# Patient Record
Sex: Female | Born: 1955
Health system: Southern US, Community
[De-identification: ages and names within clinical notes are randomized; demographics above are authoritative.]

## PROBLEM LIST (undated history)

## (undated) DIAGNOSIS — E538 Deficiency of other specified B group vitamins: Secondary | ICD-10-CM

## (undated) DIAGNOSIS — C50919 Malignant neoplasm of unspecified site of unspecified female breast: Secondary | ICD-10-CM

## (undated) DIAGNOSIS — K2270 Barrett's esophagus without dysplasia: Secondary | ICD-10-CM

## (undated) DIAGNOSIS — K219 Gastro-esophageal reflux disease without esophagitis: Secondary | ICD-10-CM

## (undated) DIAGNOSIS — M8580 Other specified disorders of bone density and structure, unspecified site: Secondary | ICD-10-CM

## (undated) DIAGNOSIS — I10 Essential (primary) hypertension: Secondary | ICD-10-CM

## (undated) DIAGNOSIS — J40 Bronchitis, not specified as acute or chronic: Secondary | ICD-10-CM

## (undated) DIAGNOSIS — C50912 Malignant neoplasm of unspecified site of left female breast: Secondary | ICD-10-CM

## (undated) DIAGNOSIS — F419 Anxiety disorder, unspecified: Secondary | ICD-10-CM

## (undated) HISTORY — DX: Essential (primary) hypertension: I10

## (undated) HISTORY — DX: Barrett's esophagus without dysplasia: K22.70

## (undated) HISTORY — DX: Deficiency of other specified B group vitamins: E53.8

## (undated) HISTORY — PX: MASTECTOMY: SHX3

## (undated) HISTORY — DX: Malignant neoplasm of unspecified site of unspecified female breast: C50.919

## (undated) HISTORY — DX: Gastro-esophageal reflux disease without esophagitis: K21.9

## (undated) HISTORY — PX: BREAST BIOPSY: SHX20

## (undated) HISTORY — DX: Other specified disorders of bone density and structure, unspecified site: M85.80

---

## 2001-06-01 ENCOUNTER — Encounter: Payer: Self-pay | Admitting: General Surgery

## 2001-06-01 ENCOUNTER — Ambulatory Visit (HOSPITAL_COMMUNITY): Admission: RE | Admit: 2001-06-01 | Discharge: 2001-06-01 | Payer: Self-pay | Admitting: General Surgery

## 2001-06-12 ENCOUNTER — Ambulatory Visit (HOSPITAL_COMMUNITY): Admission: RE | Admit: 2001-06-12 | Discharge: 2001-06-12 | Payer: Self-pay | Admitting: Internal Medicine

## 2001-11-30 ENCOUNTER — Ambulatory Visit (HOSPITAL_COMMUNITY): Admission: RE | Admit: 2001-11-30 | Discharge: 2001-11-30 | Payer: Self-pay | Admitting: General Surgery

## 2001-11-30 ENCOUNTER — Encounter: Payer: Self-pay | Admitting: General Surgery

## 2001-12-11 ENCOUNTER — Other Ambulatory Visit: Admission: RE | Admit: 2001-12-11 | Discharge: 2001-12-11 | Payer: Self-pay | Admitting: General Surgery

## 2002-06-17 ENCOUNTER — Encounter: Payer: Self-pay | Admitting: General Surgery

## 2002-06-17 ENCOUNTER — Ambulatory Visit (HOSPITAL_COMMUNITY): Admission: RE | Admit: 2002-06-17 | Discharge: 2002-06-17 | Payer: Self-pay | Admitting: General Surgery

## 2003-06-30 ENCOUNTER — Ambulatory Visit (HOSPITAL_COMMUNITY): Admission: RE | Admit: 2003-06-30 | Discharge: 2003-06-30 | Payer: Self-pay | Admitting: General Surgery

## 2005-02-15 ENCOUNTER — Ambulatory Visit (HOSPITAL_COMMUNITY): Admission: RE | Admit: 2005-02-15 | Discharge: 2005-02-15 | Payer: Self-pay | Admitting: General Surgery

## 2006-02-17 ENCOUNTER — Ambulatory Visit (HOSPITAL_COMMUNITY): Admission: RE | Admit: 2006-02-17 | Discharge: 2006-02-17 | Payer: Self-pay | Admitting: General Surgery

## 2006-03-08 ENCOUNTER — Ambulatory Visit (HOSPITAL_COMMUNITY): Admission: RE | Admit: 2006-03-08 | Discharge: 2006-03-08 | Payer: Self-pay | Admitting: General Surgery

## 2006-09-20 ENCOUNTER — Ambulatory Visit (HOSPITAL_COMMUNITY): Admission: RE | Admit: 2006-09-20 | Discharge: 2006-09-20 | Payer: Self-pay | Admitting: General Surgery

## 2007-03-21 ENCOUNTER — Ambulatory Visit (HOSPITAL_COMMUNITY): Admission: RE | Admit: 2007-03-21 | Discharge: 2007-03-21 | Payer: Self-pay | Admitting: General Surgery

## 2007-07-02 ENCOUNTER — Other Ambulatory Visit: Admission: RE | Admit: 2007-07-02 | Discharge: 2007-07-02 | Payer: Self-pay | Admitting: Obstetrics and Gynecology

## 2008-03-28 ENCOUNTER — Ambulatory Visit (HOSPITAL_COMMUNITY): Admission: RE | Admit: 2008-03-28 | Discharge: 2008-03-28 | Payer: Self-pay | Admitting: Pediatrics

## 2008-07-23 ENCOUNTER — Other Ambulatory Visit: Admission: RE | Admit: 2008-07-23 | Discharge: 2008-07-23 | Payer: Self-pay | Admitting: Obstetrics and Gynecology

## 2008-11-01 ENCOUNTER — Emergency Department (HOSPITAL_COMMUNITY): Admission: EM | Admit: 2008-11-01 | Discharge: 2008-11-01 | Payer: Self-pay | Admitting: Emergency Medicine

## 2008-11-27 ENCOUNTER — Ambulatory Visit (HOSPITAL_COMMUNITY): Admission: RE | Admit: 2008-11-27 | Discharge: 2008-11-27 | Payer: Self-pay | Admitting: Family Medicine

## 2009-04-01 ENCOUNTER — Ambulatory Visit (HOSPITAL_COMMUNITY): Admission: RE | Admit: 2009-04-01 | Discharge: 2009-04-01 | Payer: Self-pay | Admitting: General Surgery

## 2010-05-18 ENCOUNTER — Ambulatory Visit (HOSPITAL_COMMUNITY): Admission: RE | Admit: 2010-05-18 | Discharge: 2010-05-18 | Payer: Self-pay | Admitting: General Surgery

## 2010-05-26 ENCOUNTER — Ambulatory Visit (HOSPITAL_COMMUNITY): Admission: RE | Admit: 2010-05-26 | Discharge: 2010-05-26 | Payer: Self-pay | Admitting: General Surgery

## 2011-05-04 ENCOUNTER — Other Ambulatory Visit (HOSPITAL_COMMUNITY): Payer: Self-pay | Admitting: General Surgery

## 2011-05-04 DIAGNOSIS — Z139 Encounter for screening, unspecified: Secondary | ICD-10-CM

## 2011-05-20 ENCOUNTER — Ambulatory Visit (HOSPITAL_COMMUNITY)
Admission: RE | Admit: 2011-05-20 | Discharge: 2011-05-20 | Disposition: A | Discharge status: Home / Self Care | Payer: BC Managed Care – PPO | Source: Ambulatory Visit | Attending: General Surgery | Admitting: General Surgery

## 2011-05-20 DIAGNOSIS — Z139 Encounter for screening, unspecified: Secondary | ICD-10-CM

## 2011-05-20 DIAGNOSIS — Z1231 Encounter for screening mammogram for malignant neoplasm of breast: Secondary | ICD-10-CM | POA: Insufficient documentation

## 2011-06-10 ENCOUNTER — Encounter (INDEPENDENT_AMBULATORY_CARE_PROVIDER_SITE_OTHER): Payer: Self-pay | Admitting: *Deleted

## 2011-06-23 ENCOUNTER — Ambulatory Visit (INDEPENDENT_AMBULATORY_CARE_PROVIDER_SITE_OTHER): Payer: BC Managed Care – PPO | Admitting: Internal Medicine

## 2011-06-23 ENCOUNTER — Encounter (INDEPENDENT_AMBULATORY_CARE_PROVIDER_SITE_OTHER): Payer: Self-pay | Admitting: *Deleted

## 2011-06-23 ENCOUNTER — Other Ambulatory Visit (INDEPENDENT_AMBULATORY_CARE_PROVIDER_SITE_OTHER): Payer: Self-pay | Admitting: *Deleted

## 2011-06-23 ENCOUNTER — Encounter (INDEPENDENT_AMBULATORY_CARE_PROVIDER_SITE_OTHER): Payer: Self-pay | Admitting: Internal Medicine

## 2011-06-23 DIAGNOSIS — K219 Gastro-esophageal reflux disease without esophagitis: Secondary | ICD-10-CM

## 2011-06-23 DIAGNOSIS — I10 Essential (primary) hypertension: Secondary | ICD-10-CM | POA: Insufficient documentation

## 2011-06-23 NOTE — Progress Notes (Signed)
Subjective:     Patient ID: Heather Vaughan, female   DOB: Dec 27, 1955, 55 y.o.   MRN: [id number]  HPI She has had acid reflux for 2-3 yrs. She took Omeprazole for yrs and it worked. She says all of a sudden it stopped working. She was started on the Nexium about a month ago. With the Nexium she continues to have esophageal burning.  She is also taking and  acid reducer at night. Yesterday when she bent over she felt acid up in her esophagus. Her symptoms are  getting worse. At night it is worse. She is sleeping on 2 pillows. Brett Albino really gives her trouble.  Her appetite is good. No weight loss. She is on a bland diet.  She says she is constipated since starting the Nexium. She is having a BM about once a week.. No melena or bright red rectal bleeding.   Recent H. Pyloria was negative. No dysphagia. Review of Systems see hpi Current Outpatient Prescriptions  Medication Sig Dispense Refill  . DULoxetine (CYMBALTA) 30 MG capsule Take 30 mg by mouth daily.        Marland Kitchen esomeprazole (NEXIUM) 20 MG capsule Take 20 mg by mouth daily before breakfast.        . ranitidine (ZANTAC) 150 MG tablet Take 150 mg by mouth 2 (two) times daily.        Marland Kitchen losartan-hydrochlorothiazide (HYZAAR) 100-25 MG per tablet Take 1 tablet by mouth daily.        Marland Kitchen omeprazole (PRILOSEC) 20 MG capsule Take 20 mg by mouth daily.         Past Surgical History  Procedure Date  . Breast biopsy     all benign   Past Medical History  Diagnosis Date  . GERD (gastroesophageal reflux disease)   . Hypertension    History   Social History  . Marital Status: Married    Spouse Name: N/A    Number of Children: N/A  . Years of Education: N/A   Occupational History  . Not on file.   Social History Main Topics  . Smoking status: Never Smoker   . Smokeless tobacco: Not on file  . Alcohol Use: No  . Drug Use: No  . Sexually Active: Not on file   Other Topics Concern  . Not on file   Social History Narrative  . No narrative on  file   Family Status  Relation Status Death Age  . Mother Deceased     DM  . Father Deceased     HTN, Lung disease  . Brother Deceased     COPD       Objective:   Physical Exam  Filed Vitals:   06/23/11 1014  BP: 128/78  Pulse: 72  Temp: 98.7 F (37.1 C)  Height: 5\' 2"  (1.575 m)  Weight: 139 lb 8 oz (63.277 kg)    Alert and oriented. Skin warm and dry. Oral mucosa is moist. Natural teeth in good condition. Sclera anicteric, conjunctivae is pink. Thyroid not enlarged. No cervical lymphadenopathy. Lungs clear. Heart regular rate and rhythm.  Abdomen is soft. Bowel sounds are positive. No hepatomegaly. No abdominal masses felt. No tenderness.  No edema to lower extremities. Patient is alert and oriented.     Assessment:    Refractory GERD.  She has been on mutlple PPIs that have not completely controlled her symptoms.    Plan:    Will schedule and EGD with Dr. Karilyn Cota.    The risks  and benefits such as perforation, bleeding, and infection were reviewed with the patient and is agreeable. Nexium 20mg  BID

## 2011-06-23 NOTE — Patient Instructions (Signed)
Will schedule and EGD with Dr. Rehman 

## 2011-06-27 ENCOUNTER — Telehealth (INDEPENDENT_AMBULATORY_CARE_PROVIDER_SITE_OTHER): Payer: Self-pay | Admitting: *Deleted

## 2011-06-27 NOTE — Telephone Encounter (Signed)
Patient states he told her to take her nexium 20 mg bid instead of daily, by doing it this way she will not have enough pills and would like RX called to [facility] ([place])

## 2011-06-28 NOTE — Telephone Encounter (Signed)
Rx for BID Nexium called in to [facility]

## 2011-07-01 ENCOUNTER — Encounter (HOSPITAL_COMMUNITY): Payer: Self-pay | Admitting: Pharmacy Technician

## 2011-07-07 ENCOUNTER — Encounter (HOSPITAL_COMMUNITY): Payer: Self-pay

## 2011-07-07 ENCOUNTER — Other Ambulatory Visit: Payer: Self-pay

## 2011-07-07 ENCOUNTER — Encounter (HOSPITAL_COMMUNITY)
Admission: RE | Admit: 2011-07-07 | Discharge: 2011-07-07 | Disposition: A | Discharge status: Home / Self Care | Payer: BC Managed Care – PPO | Source: Ambulatory Visit | Attending: Podiatry | Admitting: Podiatry

## 2011-07-07 LAB — CBC
MCHC: 33.8 g/dL (ref 30.0–36.0)
RDW: 12.2 % (ref 11.5–15.5)

## 2011-07-07 LAB — BASIC METABOLIC PANEL
BUN: 12 mg/dL (ref 6–23)
GFR calc Af Amer: >90 mL/min (ref >90)
GFR calc non Af Amer: >90 mL/min (ref >90)
Potassium: 3.6 mEq/L (ref 3.5–5.1)
Sodium: 141 mEq/L (ref 135–145)

## 2011-07-07 LAB — SURGICAL PCR SCREEN: Staphylococcus aureus: NEGATIVE

## 2011-07-07 MED ORDER — CEFAZOLIN SODIUM 1-5 GM-% IV SOLN: 1.0000 g | Freq: Once | INTRAVENOUS | Status: DC

## 2011-07-07 NOTE — Patient Instructions (Addendum)
M20 Heather Vaughan  07/07/2011   Your procedure is scheduled on:   07/14/2011  Report to [facility] at  9:30  AM.  Call this number if you have problems the morning of surgery: [phone]   Remember:   Do not eat food:After Midnight.  Do not drink clear liquids: After Midnight.  Take these medicines the morning of surgery with A SIP OF WATER: cymbalta,nexium,hyzaar,prilosec  Do not wear jewelry, make-up or nail polish.  Do not wear lotions, powders, or perfumes. You may wear deodorant.  Do not shave 48 hours prior to surgery.  Do not bring valuables to the hospital.  Contacts, dentures or bridgework may not be worn into surgery.  Leave suitcase in the car. After surgery it may be brought to your room.  For patients admitted to the hospital, checkout time is 11:00 AM the day of discharge.   Patients discharged the day of surgery will not be allowed to drive home.  Name and phone number of your driver: family  Special Instructions: CHG Shower Use Special Wash: 1/2 bottle night before surgery and 1/2 bottle morning of surgery.   Please read over the following fact sheets that you were given: Pain Booklet, MRSA Information, Surgical Site Infection Prevention, Anesthesia Post-op Instructions and Care and Recovery After Surgery      Morton's Neuroma Neuralgia (nerve pain) or neuroma (benign [non-cancerous] nerve tumor) may develop on any interdigital nerve. The interdigital nerves (nerves between digits) of the foot travel beneath and between the metatarsals (long bones of the fore foot) and pass the nerve endings to the toes. The third interdigital is a common place for a small neuroma to form called Morton's neuroma. Another nerve to be affected commonly is the fourth interdigital nerve. This would be in approximately in the area of the base or ball under the bottom of your fourth toe. This condition occurs more commonly in women and is usually on one side. It is usually first noticed by  pain radiating (spreading) to the ball of the foot or to the toes. CAUSES The cause of interdigital neuralgia may be from low grade repetitive trauma (damage caused by an accident) as in activities causing a repeated pounding of the foot (running, jumping etc.). It is also caused by improper footwear or recent loss of the fatty padding on the bottom of the foot. TREATMENT  The condition often resolves (goes away) simply with decreasing activity if that is thought to be the cause. Proper shoes are beneficial. Orthotics (special foot support aids) such as a metatarsal bar are often beneficial. This condition usually responds to conservative therapy, however if surgery is necessary it usually brings complete relief. HOME CARE INSTRUCTIONS   Apply ice to the area of soreness for 15 to 20 minutes, 3 to 4 times per day, while awake for the first 2 days. Put ice in a plastic bag and place a towel between the bag of ice and your skin.   Only take over-the-counter or prescription medicines for pain, discomfort, or fever as directed by your caregiver.  MAKE SURE YOU:   Understand these instructions.   Will watch your condition.   Will get help right away if you are not doing well or get worse.  Document Released: 11/21/2000 Document Revised: 04/27/2011 Document Reviewed: 08/15/2005 ExitCare Patient Information 2012 ExitCare, [place]     PATIENT INSTRUCTIONS POST-ANESTHESIA  IMMEDIATELY FOLLOWING SURGERY:  Do not drive or operate machinery for the first twenty four hours after surgery.  Do not make any important decisions for twenty four hours after surgery or while taking narcotic pain medications or sedatives.  If you develop intractable nausea and vomiting or a severe headache please notify your doctor immediately.  FOLLOW-UP:  Please make an appointment with your surgeon as instructed. You do not need to follow up with anesthesia unless specifically instructed to do so.  WOUND CARE INSTRUCTIONS  (if applicable):  Keep a dry clean dressing on the anesthesia/puncture wound site if there is drainage.  Once the wound has quit draining you may leave it open to air.  Generally you should leave the bandage intact for twenty four hours unless there is drainage.  If the epidural site drains for more than 36-48 hours please call the anesthesia department.  QUESTIONS?:  Please feel free to call your physician or the hospital operator if you have any questions, and they will be happy to assist you.     [facility] Anesthesia Department [address] [place] [phone]

## 2011-07-08 ENCOUNTER — Other Ambulatory Visit (HOSPITAL_COMMUNITY): Payer: BC Managed Care – PPO

## 2011-07-13 NOTE — H&P (Signed)
Chief Complaint / History of Present Illness: This 55 year old female returns today for a consultation to discusses the proposed surgery.  She is scheduled for removal of neuroma of the third interspace of the right foot  on 07/14/2011 at [facility].  Past Medical History:  Psychiatric Hx: (+) anxiety disorder panic.   Cardiovascular Hx: (+) hypertension.   GI Hx: (+) acid reflux.   Medication History: Active: cymbalta (active), losartan (active), Nexium (active).   Allergies: No known medical allergies Past Surgical History: Patient/Guardian admits past surgical history of breast biopsy.   Social History: Patient/Guardian denies alcohol use, Patient/Guardian denies illegal drug use, Patient/Guardian denies tobacco use.   Family History: Patient/Guardian admits a family history of diabetes associated with mother, hypertension associated with father.   Review of Systems: No abnormal findings with the exception of the chief complaint.    Physical Exam: The patient is a pleasant, 55 year old female in no apparent distress.  She is oriented to person, place and time.  She is ambulatory.  Skin is warm, dry and supple.  All nails are well trimmed and free of pathology.  Pedal hair growth and distribution is normal.  No hyperkeratotic lesions are present.  No ulceration are present.  Dorsalis pedis and posterior tibial pulses measure 2/4.  Capillary refill time is immediate.  No pedal edema is present.  Muscle tone is normal.   Pain with palpation of the right 3rd intermetatarsal space is present.  Light touch sensation is grossly intact.    Test Results:  None to report at this time.    Impression:  Neuroma.    Plan:   The podiatric pathology and treatment options were again reviewed with the her.  I discussed with the her the surgical procedure itself, the indications, the risks, possible complications, post-operative course and alternative treatments. I gave no guarantees regarding the  outcome. She would like to proceed with the proposed surgery.  An informed consent was obtained.  She is to return for her post-operative appointment or sooner if problems arise.  Prescriptions: Rx: Phenergan- 12.5 mg tablet , Take 1 tablet by mouth every four to six hours as needed. Max daily dose: 6.  Dispense: 21 tablet. Refills: 1.  Allow Generic: Yes Rx: Percocet- 325 mg-10mg  tablet , Take 1 tablet by mouth every six hours as needed for pain. Max daily dose: 4.  Dispense: 56 tablet. Refills: 0.  Allow Generic: Yes

## 2011-07-14 ENCOUNTER — Encounter (HOSPITAL_COMMUNITY): Payer: Self-pay | Admitting: *Deleted

## 2011-07-14 ENCOUNTER — Encounter (HOSPITAL_COMMUNITY)
Admission: RE | Disposition: A | Discharge status: Home / Self Care | Payer: Self-pay | Source: Ambulatory Visit | Attending: Podiatry

## 2011-07-14 ENCOUNTER — Ambulatory Visit (HOSPITAL_COMMUNITY)
Admission: RE | Admit: 2011-07-14 | Discharge: 2011-07-14 | Disposition: A | Discharge status: Home / Self Care | Payer: BC Managed Care – PPO | Source: Ambulatory Visit | Attending: Podiatry | Admitting: Podiatry

## 2011-07-14 ENCOUNTER — Encounter (HOSPITAL_COMMUNITY): Payer: Self-pay | Admitting: Anesthesiology

## 2011-07-14 ENCOUNTER — Other Ambulatory Visit: Payer: Self-pay

## 2011-07-14 ENCOUNTER — Ambulatory Visit (HOSPITAL_COMMUNITY): Payer: BC Managed Care – PPO | Admitting: Anesthesiology

## 2011-07-14 DIAGNOSIS — I10 Essential (primary) hypertension: Secondary | ICD-10-CM | POA: Insufficient documentation

## 2011-07-14 DIAGNOSIS — D3613 Benign neoplasm of peripheral nerves and autonomic nervous system of lower limb, including hip: Secondary | ICD-10-CM

## 2011-07-14 DIAGNOSIS — D2120 Benign neoplasm of connective and other soft tissue of unspecified lower limb, including hip: Secondary | ICD-10-CM | POA: Insufficient documentation

## 2011-07-14 HISTORY — PX: EXCISION MORTON'S NEUROMA: SHX5013

## 2011-07-14 SURGERY — EXCISION, MORTON'S NEUROMA
Anesthesia: Monitor Anesthesia Care | Site: Foot | Laterality: Right | Wound class: Clean

## 2011-07-14 MED ORDER — CEFAZOLIN SODIUM 1 G IJ SOLR
INTRAMUSCULAR | Status: AC
  Filled 2011-07-14: qty 10

## 2011-07-14 MED ORDER — MIDAZOLAM HCL 2 MG/2ML IJ SOLN
INTRAMUSCULAR | Status: AC
  Filled 2011-07-14: qty 2

## 2011-07-14 MED ORDER — SODIUM CHLORIDE 0.9 % IR SOLN: 1.0000 "application " | Freq: Once | Status: DC

## 2011-07-14 MED ORDER — LACTATED RINGERS IV SOLN
INTRAVENOUS | Status: DC
  Administered 2011-07-14: 1000 mL via INTRAVENOUS

## 2011-07-14 MED ORDER — PROPOFOL 10 MG/ML IV EMUL
INTRAVENOUS | Status: DC | PRN
  Administered 2011-07-14: 75 ug/kg/min via INTRAVENOUS

## 2011-07-14 MED ORDER — CEFAZOLIN SODIUM 1-5 GM-% IV SOLN
INTRAVENOUS | Status: DC | PRN
  Administered 2011-07-14: 1 g via INTRAVENOUS

## 2011-07-14 MED ORDER — BUPIVACAINE HCL (PF) 0.5 % IJ SOLN
INTRAMUSCULAR | Status: AC
  Filled 2011-07-14: qty 30

## 2011-07-14 MED ORDER — FENTANYL CITRATE 0.05 MG/ML IJ SOLN
INTRAMUSCULAR | Status: AC
  Filled 2011-07-14: qty 2

## 2011-07-14 MED ORDER — BUPIVACAINE HCL (PF) 0.5 % IJ SOLN
INTRAMUSCULAR | Status: DC | PRN
  Administered 2011-07-14: 10 mL
  Administered 2011-07-14: 3 mL

## 2011-07-14 MED ORDER — SODIUM CHLORIDE 0.9 % IR SOLN
Status: DC | PRN
  Administered 2011-07-14: 1000 mL

## 2011-07-14 MED ORDER — MIDAZOLAM HCL 2 MG/2ML IJ SOLN
1.0000 mg | INTRAMUSCULAR | Status: DC | PRN
  Administered 2011-07-14: 2 mg via INTRAVENOUS

## 2011-07-14 MED ORDER — FENTANYL CITRATE 0.05 MG/ML IJ SOLN
INTRAMUSCULAR | Status: DC | PRN
  Administered 2011-07-14: 50 ug via INTRAVENOUS
  Administered 2011-07-14: 25 ug via INTRAVENOUS

## 2011-07-14 MED ORDER — PROPOFOL 10 MG/ML IV EMUL
INTRAVENOUS | Status: DC | PRN
  Administered 2011-07-14: 20 mg via INTRAVENOUS
  Administered 2011-07-14: 10 mg via INTRAVENOUS
  Administered 2011-07-14: 20 mg via INTRAVENOUS

## 2011-07-14 SURGICAL SUPPLY — 53 items
BAG HAMPER (MISCELLANEOUS) ×2 IMPLANT
BANDAGE ELASTIC 4 VELCRO NS (GAUZE/BANDAGES/DRESSINGS) IMPLANT
BANDAGE ESMARK 4X12 BL STRL LF (DISPOSABLE) ×1 IMPLANT
BANDAGE GAUZE ELAST BULKY 4 IN (GAUZE/BANDAGES/DRESSINGS) ×2 IMPLANT
BENZOIN TINCTURE PRP APPL 2/3 (GAUZE/BANDAGES/DRESSINGS) IMPLANT
BLADE AVERAGE 25X9 (BLADE) IMPLANT
BLADE OSC/SAGITTAL MD 9X18.5 (BLADE) IMPLANT
BLADE SURG 15 STRL LF DISP TIS (BLADE) ×1 IMPLANT
BLADE SURG 15 STRL SS (BLADE) ×1
BNDG ESMARK 4X12 BLUE STRL LF (DISPOSABLE) ×2
CHLORAPREP W/TINT 26ML (MISCELLANEOUS) ×2 IMPLANT
CLOTH BEACON ORANGE TIMEOUT ST (SAFETY) ×2 IMPLANT
COVER LIGHT HANDLE STERIS (MISCELLANEOUS) ×4 IMPLANT
CUFF TOURN SGL LL 12 (TOURNIQUET CUFF) ×2 IMPLANT
CUFF TOURNIQUET SINGLE 18IN (TOURNIQUET CUFF) IMPLANT
DECANTER SPIKE VIAL GLASS SM (MISCELLANEOUS) ×2 IMPLANT
DRAPE OEC MINIVIEW 54X84 (DRAPES) ×2 IMPLANT
DRSG ADAPTIC 3X8 NADH LF (GAUZE/BANDAGES/DRESSINGS) ×2 IMPLANT
DURA STEPPER LG (CAST SUPPLIES) IMPLANT
DURA STEPPER MED (CAST SUPPLIES) IMPLANT
DURA STEPPER SML (CAST SUPPLIES) IMPLANT
DURA STEPPER XL (SOFTGOODS) IMPLANT
ELECT REM PT RETURN 9FT ADLT (ELECTROSURGICAL) ×2
ELECTRODE REM PT RTRN 9FT ADLT (ELECTROSURGICAL) ×1 IMPLANT
GLOVE BIO SURGEON STRL SZ7.5 (GLOVE) ×2 IMPLANT
GLOVE BIOGEL PI IND STRL 7.0 (GLOVE) ×1 IMPLANT
GLOVE BIOGEL PI IND STRL 7.5 (GLOVE) ×1 IMPLANT
GLOVE BIOGEL PI INDICATOR 7.0 (GLOVE) ×1
GLOVE BIOGEL PI INDICATOR 7.5 (GLOVE) ×1
GLOVE ECLIPSE 6.5 STRL STRAW (GLOVE) ×2 IMPLANT
GLOVE ECLIPSE 7.0 STRL STRAW (GLOVE) ×2 IMPLANT
GLOVE EXAM NITRILE MD LF STRL (GLOVE) ×2 IMPLANT
GOWN STRL REIN XL XLG (GOWN DISPOSABLE) ×6 IMPLANT
K-WIRE 229MX1.6 (WIRE) IMPLANT
K-WIRE 6 (WIRE)
KIT ROOM TURNOVER APOR (KITS) ×2 IMPLANT
KWIRE 6 (WIRE) IMPLANT
MARKER SKIN DUAL TIP RULER LAB (MISCELLANEOUS) ×2 IMPLANT
NEEDLE HYPO 18GX1.5 BLUNT FILL (NEEDLE) IMPLANT
NEEDLE HYPO 27GX1-1/4 (NEEDLE) ×4 IMPLANT
NS IRRIG 1000ML POUR BTL (IV SOLUTION) ×2 IMPLANT
PACK BASIC LIMB (CUSTOM PROCEDURE TRAY) ×2 IMPLANT
PAD ARMBOARD 7.5X6 YLW CONV (MISCELLANEOUS) ×2 IMPLANT
RASP SM TEAR CROSS CUT (RASP) IMPLANT
SET BASIN LINEN APH (SET/KITS/TRAYS/PACK) ×2 IMPLANT
SPONGE GAUZE 4X4 12PLY (GAUZE/BANDAGES/DRESSINGS) ×2 IMPLANT
STRIP CLOSURE SKIN 1/2X4 (GAUZE/BANDAGES/DRESSINGS) ×2 IMPLANT
SUT BONE WAX W31G (SUTURE) IMPLANT
SUT MON AB 5-0 PS2 18 (SUTURE) ×2 IMPLANT
SUT PROLENE 4 0 PS 2 18 (SUTURE) ×2 IMPLANT
SUT VIC AB 4-0 PS2 27 (SUTURE) ×2 IMPLANT
SYR CONTROL 10ML LL (SYRINGE) ×4 IMPLANT
TOWEL OR 17X26 4PK STRL BLUE (TOWEL DISPOSABLE) IMPLANT

## 2011-07-14 NOTE — Anesthesia Postprocedure Evaluation (Signed)
  Anesthesia Post-op Note  Patient: Heather Vaughan  Procedure(s) Performed:  EXCISION MORTON'S NEUROMA - Removal Neuroma 3rd Interspace Right Foot  Patient Location: PACU and Short Stay  Anesthesia Type: MAC  Level of Consciousness: awake, alert  and oriented  Airway and Oxygen Therapy: Patient Spontanous Breathing  Post-op Pain: none  Post-op Assessment: Post-op Vital signs reviewed, Patient's Cardiovascular Status Stable, Respiratory Function Stable and Patent Airway  Post-op Vital Signs: Reviewed and stable  Complications: No apparent anesthesia complications

## 2011-07-14 NOTE — Interval H&P Note (Signed)
History and Physical Interval Note:   07/14/2011   11:03 AM   Heather Vaughan  has presented today for surgery, with the diagnosis of neuroma 3rd interspace right foot  The various methods of treatment have been discussed with the patient and family. After consideration of risks, benefits and other options for treatment, the patient has consented to  Procedure(s): EXCISION MORTON'S NEUROMA OF THE RIGHT FOOT as a surgical intervention .  The patients' history has been reviewed, patient examined, no change in status, stable for surgery.  I have reviewed the patients' chart and labs.  Questions were answered to the patient's satisfaction.     [name] IVAN  DPM

## 2011-07-14 NOTE — Transfer of Care (Signed)
Immediate Anesthesia Transfer of Care Note  Patient: Heather Vaughan  Procedure(s) Performed:  EXCISION MORTON'S NEUROMA - Removal Neuroma 3rd Interspace Right Foot  Patient Location: PACU and Short Stay  Anesthesia Type: MAC  Level of Consciousness: awake, alert  and oriented  Airway & Oxygen Therapy: Patient Spontanous Breathing  Post-op Assessment: Report given to PACU RN  Post vital signs: Reviewed and stable  Complications: No apparent anesthesia complications

## 2011-07-14 NOTE — Brief Op Note (Signed)
07/14/2011  12:29 PM  PATIENT:  Jaynie Bream  55 y.o. 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  PROCEDURE:  Excision of neuroma 3rd interspace right foot  SURGEON:  Dallas Schimke, DPM  ASSISTANTS:  Valetta Close, RN   ANESTHESIA:   MAC with local  EBL:  Total I/O In: 600 [I.V.:600] Out: 0   BLOOD ADMINISTERED:none  DRAINS: none   LOCAL MEDICATIONS USED:  MARCAINE 0.5 % PLAIN  SPECIMEN:  Excision  DISPOSITION OF SPECIMEN:  PATHOLOGY  COUNTS:  YES  TOURNIQUET:   Total Tourniquet Time Documented: Calf (Right) - 29 minutes  DICTATION: .Other Dictation: Dictation Number [id number]  PLAN OF CARE: Discharge to home after PACU  PATIENT DISPOSITION:  Short Stay   Delay start of Pharmacological VTE agent (>24hrs) due to surgical blood loss or risk of bleeding:  No

## 2011-07-14 NOTE — Op Note (Signed)
NAME:  Heather Vaughan, Heather Vaughan              ACCOUNT NO.:  [id number]  MEDICAL RECORD NO.:  [id number]  LOCATION:  [facility]                          FACILITY:  [facility]  PHYSICIAN:  B. Theola Sequin, MD   DATE OF BIRTH:  Jan 11, 1956  DATE OF PROCEDURE:  07/14/2011 DATE OF DISCHARGE:  07/14/2011                              OPERATIVE REPORT   SURGEON:  B. Theola Sequin, MD  ASSISTANT:  Valetta Close.  PREOPERATIVE DIAGNOSIS:  Neuroma, third interspace of the right foot.  POSTOPERATIVE DIAGNOSIS:  Neuroma, third interspace of the right foot.  PROCEDURE:  Excision of neuroma, third interspace, right foot.  ANESTHESIA:  MAC with local.  HEMOSTASIS:  Pneumatic ankle tourniquet at 250 mmHg.  ESTIMATED BLOOD LOSS:  Minimal (less than 5 mL injectable 0.5% Marcaine plain).  PATHOLOGY:  Neuroma, third interspace, right foot.  COMPLICATIONS:  None.  PROCEDURE IN DETAIL:  The patient was brought to the operating room, placed on the operative table in supine position.  Pneumatic ankle tourniquet was placed about the patient's right ankle.  The foot was anesthetized using 0.5% Marcaine plain.  The foot was scrubbed, prepped, and draped in the usual sterile manner.  The limb was then elevated, exsanguinated, and the pneumatic ankle tourniquet inflated to 250 mmHg.  Attention was directed to the dorsal aspect of the third interspace of the right foot where a linear longitudinal incision was made, extending from the webspace proximally.  Dissection was continued deep down to the level of the deep transverse metatarsal ligament which was transected. The neuroma was clearly visible through the incision site and it was traced distally into its bifurcation and transected at each proper digital nerve, extending into the third and fourth digits.  The neuroma was then traced proximally and transected proximal to the enlarged area of the nerve.  The remaining portion of the common digital nerve was transposed  into the adjacent interosseous musculature.  The wound was then irrigated with copious amounts of sterile irrigant.  Subcutaneous structure reapproximated using 4-0 Vicryl.  Skin was reapproximated using 5-0 Monocryl in a running subcuticular manner.  Incision was reinforced with Steri-Strips. Sterile compressive dressing was applied to the right foot.  The pneumatic ankle tourniquet was deflated and a prompt hyperemic response was noted to all digits of the operative foot.          ______________________________ B. Theola Sequin, MD     BIM/MEDQ  D:  07/14/2011  T:  07/14/2011  Job:  [id number]

## 2011-07-14 NOTE — Anesthesia Preprocedure Evaluation (Addendum)
Anesthesia Evaluation  Patient identified by MRN, date of birth, ID band Patient awake    Reviewed: Allergy & Precautions, H&P , NPO status , Patient's Chart, lab work & pertinent test results  History of Anesthesia Complications Negative for: history of anesthetic complications  Airway Mallampati: I      Dental  (+) Partial Upper and Teeth Intact   Pulmonary former smoker   Pulmonary exam normal       Cardiovascular hypertension, Pt. on medications Regular Normal    Neuro/Psych    GI/Hepatic GERD-  Medicated and Controlled,  Endo/Other    Renal/GU      Musculoskeletal   Abdominal   Peds  Hematology   Anesthesia Other Findings   Reproductive/Obstetrics                           Anesthesia Physical Anesthesia Plan  ASA: II  Anesthesia Plan: MAC   Post-op Pain Management:    Induction:   Airway Management Planned: Nasal Cannula  Additional Equipment:   Intra-op Plan:   Post-operative Plan:   Informed Consent: I have reviewed the patients History and Physical, chart, labs and discussed the procedure including the risks, benefits and alternatives for the proposed anesthesia with the patient or authorized representative who has indicated his/her understanding and acceptance.     Plan Discussed with:   Anesthesia Plan Comments:         Anesthesia Quick Evaluation

## 2011-07-19 ENCOUNTER — Encounter (HOSPITAL_COMMUNITY): Payer: Self-pay | Admitting: Podiatry

## 2011-07-25 MED ORDER — GLYCOPYRROLATE 0.2 MG/ML IJ SOLN
INTRAMUSCULAR | Status: AC
  Filled 2011-07-25: qty 2

## 2011-07-25 MED ORDER — NEOSTIGMINE METHYLSULFATE 1 MG/ML IJ SOLN
INTRAMUSCULAR | Status: AC
  Filled 2011-07-25: qty 10

## 2011-07-27 ENCOUNTER — Encounter (HOSPITAL_COMMUNITY): Payer: Self-pay | Admitting: Pharmacy Technician

## 2011-07-27 DIAGNOSIS — J40 Bronchitis, not specified as acute or chronic: Secondary | ICD-10-CM

## 2011-07-27 HISTORY — DX: Bronchitis, not specified as acute or chronic: J40

## 2011-08-02 MED ORDER — SODIUM CHLORIDE 0.45 % IV SOLN
Freq: Once | INTRAVENOUS | Status: AC
  Administered 2011-08-03: 12:00:00 via INTRAVENOUS

## 2011-08-03 ENCOUNTER — Other Ambulatory Visit (INDEPENDENT_AMBULATORY_CARE_PROVIDER_SITE_OTHER): Payer: Self-pay | Admitting: Internal Medicine

## 2011-08-03 ENCOUNTER — Encounter (HOSPITAL_COMMUNITY): Payer: Self-pay | Admitting: *Deleted

## 2011-08-03 ENCOUNTER — Encounter (HOSPITAL_COMMUNITY)
Admission: RE | Disposition: A | Discharge status: Home / Self Care | Payer: Self-pay | Source: Ambulatory Visit | Attending: Internal Medicine

## 2011-08-03 ENCOUNTER — Ambulatory Visit (HOSPITAL_COMMUNITY)
Admission: RE | Admit: 2011-08-03 | Discharge: 2011-08-03 | Disposition: A | Discharge status: Home / Self Care | Payer: BC Managed Care – PPO | Source: Ambulatory Visit | Attending: Internal Medicine | Admitting: Internal Medicine

## 2011-08-03 DIAGNOSIS — I10 Essential (primary) hypertension: Secondary | ICD-10-CM | POA: Insufficient documentation

## 2011-08-03 DIAGNOSIS — K219 Gastro-esophageal reflux disease without esophagitis: Secondary | ICD-10-CM | POA: Insufficient documentation

## 2011-08-03 DIAGNOSIS — K6389 Other specified diseases of intestine: Secondary | ICD-10-CM

## 2011-08-03 DIAGNOSIS — K449 Diaphragmatic hernia without obstruction or gangrene: Secondary | ICD-10-CM

## 2011-08-03 DIAGNOSIS — K2270 Barrett's esophagus without dysplasia: Secondary | ICD-10-CM | POA: Insufficient documentation

## 2011-08-03 DIAGNOSIS — K5520 Angiodysplasia of colon without hemorrhage: Secondary | ICD-10-CM

## 2011-08-03 DIAGNOSIS — Z79899 Other long term (current) drug therapy: Secondary | ICD-10-CM | POA: Insufficient documentation

## 2011-08-03 HISTORY — PX: ESOPHAGOGASTRODUODENOSCOPY: SHX5428

## 2011-08-03 HISTORY — DX: Bronchitis, not specified as acute or chronic: J40

## 2011-08-03 SURGERY — EGD (ESOPHAGOGASTRODUODENOSCOPY)
Anesthesia: Moderate Sedation

## 2011-08-03 MED ORDER — MEPERIDINE HCL 100 MG/ML IJ SOLN
INTRAMUSCULAR | Status: AC
  Filled 2011-08-03: qty 1

## 2011-08-03 MED ORDER — BUTAMBEN-TETRACAINE-BENZOCAINE 2-2-14 % EX AERO
INHALATION_SPRAY | CUTANEOUS | Status: DC | PRN
  Administered 2011-08-03: 2 via TOPICAL

## 2011-08-03 MED ORDER — MIDAZOLAM HCL 5 MG/5ML IJ SOLN
INTRAMUSCULAR | Status: AC
  Filled 2011-08-03: qty 10

## 2011-08-03 MED ORDER — MIDAZOLAM HCL 5 MG/5ML IJ SOLN
INTRAMUSCULAR | Status: DC | PRN
  Administered 2011-08-03: 1 mg via INTRAVENOUS
  Administered 2011-08-03 (×2): 2 mg via INTRAVENOUS

## 2011-08-03 MED ORDER — STERILE WATER FOR IRRIGATION IR SOLN
Status: DC | PRN
  Administered 2011-08-03: 13:00:00

## 2011-08-03 MED ORDER — MEPERIDINE HCL 25 MG/ML IJ SOLN
INTRAMUSCULAR | Status: DC | PRN
  Administered 2011-08-03 (×2): 25 mg via INTRAVENOUS

## 2011-08-03 NOTE — Op Note (Signed)
EGD PROCEDURE REPORT  PATIENT:  Heather Vaughan  MR#:  [id number] Birthdate:  07-30-1956, 55 y.o., female Endoscopist:  Dr. Malissa Hippo, MD Referred By:  Ms. Terie Purser, PA. Procedure Date: 08/03/2011  Procedure:   EGD  Indications:  Patient is 55 year old Caucasian female with symptoms of GERD for more than 10 years now with frequent breakthrough symptoms was requiring double dose PPI. Her UGI tract has never been surveyed. She is undergoing EGD primarily looking for Barrett's also make sure she does not have a large hiatal hernia.            Informed Consent:  The risks, benefits, alternatives & imponderables which include, but are not limited to, bleeding, infection, perforation, drug reaction and potential missed lesion have been reviewed.  The potential for biopsy, lesion removal, esophageal dilation, etc. have also been discussed.  Questions have been answered.  All parties agreeable.  Please see history & physical in medical record for more information.  Medications:  Demerol 50 mg IV Versed 5 mg IV Cetacaine spray topically for oropharyngeal anesthesia  Description of procedure:  The endoscope was introduced through the mouth and advanced to the second portion of the duodenum without difficulty or limitations. The mucosal surfaces were surveyed very carefully during advancement of the scope and upon withdrawal.  Findings:  Esophagus:  Mucosa of the esophagus was normal. Wavy GE junction with focal erythema. Biopsy taken to rule out short segment Barrett's. No erosions or ulcers identified. GEJ:  32 cm Hiatus:  34 cm Stomach:  Stomach was empty and distended very well with insufflation. Folds in the proximal stomach were normal. Few hyperplastic appearing polyps are noted at gastric body and fundus. Pictures were taken for the record. Duodenum:  Single AV malformation along the medial aspect of bulb along with few more fine punctate telangiectasia also at bulb. Post bulbar  mucosa and folds were normal.  Therapeutic/Diagnostic Maneuvers Performed:  Biopsy taken from GE junction to rule out short segment Barrett's esophagus.  Complications:  None  Impression: Wavy GE junction with focal erythema. Biopsy taken to rule out short segment Barrett's esophagus. No evidence of erosive esophagitis. Small sliding-type hernia. Single bulbar AV malformation long with few fine  punctate telangiectasia without stigmata of bleeding.  Recommendations:  Continue anti-reflux measures. Continue Nexium at 20 mg by mouth twice a day. I will contact patient with results of biopsy Office visit in 4 months. [name] U  08/03/2011  1:32 PM  CC: Dr. Terie Purser, PA, PA & Dr. No ref. provider found

## 2011-08-03 NOTE — H&P (Signed)
Heather Vaughan is an 55 y.o. female.   Chief Complaint: Patient is here for esophagogastroduodenoscopy. HPI: She is 55 year old Caucasian female who has had symptoms of GERD for over 10 years. He had done well with what sounds like with omeprazole. She was switched to Nexium continue to experience heartburn and regurgitation particularly at night with burning in her throat. She is doing better with double dose Nexium she is undergoing diagnostic EGD and make sure she doesn't have a large hernia or Barrett's esophagus. He has good appetite. She denies nausea, vomiting, abdominal pain melena or rectal bleeding.  Past Medical History  Diagnosis Date  . GERD (gastroesophageal reflux disease)   . Hypertension   . Bronchitis 07/27/2011    Past Surgical History  Procedure Date  . Excision morton's neuroma 07/14/2011    Procedure: EXCISION MORTON'S NEUROMA;  Surgeon: Dallas Schimke;  Location: [facility];  Service: Orthopedics;  Laterality: Right;  Removal Neuroma 3rd Interspace Right Foot  . Breast biopsy     X 4- benign    Family History  Problem Relation Age of Onset  . Anesthesia problems Neg Hx   . Hypotension Neg Hx   . Malignant hyperthermia Neg Hx   . Pseudochol deficiency Neg Hx    Social History:  reports that she quit smoking about 17 years ago. Her smoking use included Cigarettes. She has a 20 pack-year smoking history. She does not have any smokeless tobacco history on file. She reports that she does not drink alcohol or use illicit drugs.  Allergies: No Known Allergies  Medications Prior to Admission  Medication Dose Route Frequency Provider Last Rate Last Dose  . 0.45 % sodium chloride infusion   Intravenous Once Malissa Hippo, MD 20 mL/hr at 08/03/11 1212    . ceFAZolin (ANCEF) 1 G injection           . fentaNYL (SUBLIMAZE) 0.05 MG/ML injection           . glycopyrrolate (ROBINUL) 0.2 MG/ML injection           . meperidine (DEMEROL) 100 MG/ML injection           .  midazolam (VERSED) 5 MG/5ML injection           . neostigmine (PROSTIGMINE) 1 MG/ML injection           . DISCONTD: ANCEF 1 gram in 0.9% normal saline 500 mL irrigation  1 application Irrigation Once [employer]      . DISCONTD: bupivacaine (MARCAINE) 0.5 % injection    PRN Ephriam Jenkins McKinney   3 mL at 07/14/11 1215  . DISCONTD: lactated ringers infusion   Intravenous Continuous Laurene Footman, MD 75 mL/hr at 07/14/11 1041 1,000 mL at 07/14/11 1041  . DISCONTD: midazolam (VERSED) 2 MG/2ML injection           . DISCONTD: midazolam (VERSED) injection 1-2 mg  1-2 mg Intravenous Q5 Min x 3 PRN Laurene Footman, MD   2 mg at 07/14/11 1046  . DISCONTD: sodium chloride irrigation 0.9 %    PRN Ephriam Jenkins McKinney   1,000 mL at 07/14/11 1127   Medications Prior to Admission  Medication Sig Dispense Refill  . fish oil-omega-3 fatty acids 1000 MG capsule Take 2 g by mouth daily.        . traZODone (DESYREL) 50 MG tablet Take 1 tablet by mouth at bedtime as needed. For sleep      . acetaminophen-codeine (TYLENOL #2) 300-15 MG  per tablet Take 1 tablet by mouth every 6 (six) hours as needed. For pain         No results found for this or any previous visit (from the past 48 hour(s)). No results found.  Review of Systems  Constitutional: Negative for weight loss.  Gastrointestinal: Negative for abdominal pain, diarrhea, constipation, blood in stool and melena.    Blood pressure 132/82, pulse 85, temperature 99 F (37.2 C), temperature source Oral, resp. rate 18, height 5\' 2"  (1.575 m), weight 135 lb (61.236 kg), SpO2 95.00%. Physical Exam  Constitutional: She appears well-developed and well-nourished.  HENT:  Mouth/Throat: Oropharynx is clear and moist.  Eyes: Conjunctivae are normal. No scleral icterus.  Neck: No thyromegaly present.  Cardiovascular: Normal rate, regular rhythm and normal heart sounds.   No murmur heard. Respiratory: Effort normal and breath sounds normal.  GI: She  exhibits no distension and no mass. There is no tenderness.  Musculoskeletal: She exhibits no edema.  Lymphadenopathy:    She has no cervical adenopathy.  Neurological: She is alert.  Skin: Skin is warm and dry.     Assessment/Plan Chronic GERD poorly controlled with therapy Diagnostic EGD.  Tsuneo Faison U 08/03/2011, 1:09 PM

## 2011-08-09 ENCOUNTER — Encounter (INDEPENDENT_AMBULATORY_CARE_PROVIDER_SITE_OTHER): Payer: Self-pay | Admitting: *Deleted

## 2011-08-16 ENCOUNTER — Encounter (HOSPITAL_COMMUNITY): Payer: Self-pay | Admitting: Internal Medicine

## 2011-11-02 ENCOUNTER — Other Ambulatory Visit (INDEPENDENT_AMBULATORY_CARE_PROVIDER_SITE_OTHER): Payer: Self-pay | Admitting: Internal Medicine

## 2011-11-23 ENCOUNTER — Encounter (INDEPENDENT_AMBULATORY_CARE_PROVIDER_SITE_OTHER): Payer: Self-pay | Admitting: *Deleted

## 2011-12-01 ENCOUNTER — Encounter (INDEPENDENT_AMBULATORY_CARE_PROVIDER_SITE_OTHER): Payer: Self-pay | Admitting: *Deleted

## 2011-12-22 ENCOUNTER — Ambulatory Visit (INDEPENDENT_AMBULATORY_CARE_PROVIDER_SITE_OTHER): Payer: BC Managed Care – PPO | Admitting: Internal Medicine

## 2011-12-22 ENCOUNTER — Other Ambulatory Visit (INDEPENDENT_AMBULATORY_CARE_PROVIDER_SITE_OTHER): Payer: Self-pay | Admitting: *Deleted

## 2011-12-22 ENCOUNTER — Telehealth (INDEPENDENT_AMBULATORY_CARE_PROVIDER_SITE_OTHER): Payer: Self-pay | Admitting: *Deleted

## 2011-12-22 ENCOUNTER — Encounter (INDEPENDENT_AMBULATORY_CARE_PROVIDER_SITE_OTHER): Payer: Self-pay | Admitting: Internal Medicine

## 2011-12-22 VITALS — BP 128/60 | HR 72 | Temp 98.0°F | Ht 62.0 in | Wt 147.2 lb

## 2011-12-22 DIAGNOSIS — Z1211 Encounter for screening for malignant neoplasm of colon: Secondary | ICD-10-CM

## 2011-12-22 DIAGNOSIS — K2270 Barrett's esophagus without dysplasia: Secondary | ICD-10-CM

## 2011-12-22 NOTE — Telephone Encounter (Signed)
Patient needs trilyte 

## 2011-12-22 NOTE — Patient Instructions (Signed)
HOB elevated. Continue BID dosing of PPI

## 2011-12-22 NOTE — Progress Notes (Signed)
Subjective:     Patient ID: Heather Vaughan, female   DOB: [birth year], 56 y.o.   MRN: [id number]  HPIRebecca is a 56 yr old female here today for f/u after recently undergoing an EGD for GERD symptoms. EGD revealed short segment Barrett's esophagus. She tells me today that if she doesn't take the Nexium, she can tell it. Sometimes she has to take an extra acid reducer. She sleeps on 2 pillows.  Appetite is good. No weight loss. No abdominal pain. BMs are normal.   EGD: 08/03/2011: Impression:  Wavy GE junction with focal erythema. Biopsy taken to rule out short segment Barrett's esophagus.  No evidence of erosive esophagitis.  Small sliding-type hernia.  Single bulbar AV malformation long with few fine punctate telangiectasia without stigmata of bleeding. Biopsy:  She has short segment Barrett's esophagus but no displaced. Will consider repeating EGD in 3 years.   Review of Systems see hpi Current Outpatient Prescriptions  Medication Sig Dispense Refill  . acetaminophen-codeine (TYLENOL #2) 300-15 MG per tablet Take 1 tablet by mouth every 6 (six) hours as needed. For pain       . amoxicillin-clavulanate (AUGMENTIN XR) 1000-62.5 MG per tablet Take 2 tablets by mouth 2 (two) times daily.      . ciprofloxacin-dexamethasone (CIPRODEX) otic suspension 4 drops 2 (two) times daily.      . fish oil-omega-3 fatty acids 1000 MG capsule Take 2 g by mouth daily.        Marland Kitchen losartan-hydrochlorothiazide (HYZAAR) 100-25 MG per tablet Take 1 tablet by mouth daily.        Marland Kitchen NEXIUM 20 MG capsule take 1 capsule twice a day  60 capsule  3  . traZODone (DESYREL) 50 MG tablet Take 1 tablet by mouth at bedtime as needed. For sleep       Past Surgical History  Procedure Date  . Excision morton's neuroma 07/14/2011    Procedure: EXCISION MORTON'S NEUROMA;  Surgeon: Dallas Schimke;  Location: [facility];  Service: Orthopedics;  Laterality: Right;  Removal Neuroma 3rd Interspace Right Foot  . Breast biopsy     X 4- benign  . Esophagogastroduodenoscopy 08/03/2011    Procedure: ESOPHAGOGASTRODUODENOSCOPY (EGD);  Surgeon: Malissa Hippo, MD;  Location: [facility];  Service: Endoscopy;  Laterality: N/A;  12:45   Family Status  Relation Status Death Age  . Mother Deceased     DM  . Father Deceased     HTN, Lung disease  . Brother Deceased     COPD   History   Social History  . Marital Status: Married    Spouse Name: N/A    Number of Children: N/A  . Years of Education: N/A   Occupational History  . Not on file.   Social History Main Topics  . Smoking status: Former Smoker -- 1.0 packs/day for 20 years    Types: Cigarettes    Quit date: 09/05/1993  . Smokeless tobacco: Not on file  . Alcohol Use: No     occasionally  . Drug Use: No  . Sexually Active: Not on file   Other Topics Concern  . Not on file   Social History Narrative  . No narrative on file   No Known Allergies          Objective:   Physical Exam Filed Vitals:   12/22/11 1427  Height: 5\' 2"  (1.575 m)  Weight: 147 lb 3.2 oz (66.769 kg)   Alert and oriented. Skin warm and  dry. Oral mucosa is moist.   . Sclera anicteric, conjunctivae is pink. Thyroid not enlarged. No cervical lymphadenopathy. Lungs clear. Heart regular rate and rhythm.  Abdomen is soft. Bowel sounds are positive. No hepatomegaly. No abdominal masses felt. No tenderness.  No edema to lower extremities. Patient is alert and oriented.      Assessment:    Barrett's esophagus.  GERD. Continue BID Nexium. HOB elevated.    Plan:    Will check on last colonoscopy.  Continue Nexium BID. OTC acid reducer as needed.

## 2011-12-23 MED ORDER — PEG 3350-KCL-NA BICARB-NACL 420 G PO SOLR: 4000.0000 mL | Freq: Once | ORAL | Status: AC

## 2011-12-23 NOTE — Telephone Encounter (Signed)
Ann, would u put the medication back in.  It wouldn't take the first time.

## 2011-12-29 ENCOUNTER — Encounter (INDEPENDENT_AMBULATORY_CARE_PROVIDER_SITE_OTHER): Payer: Self-pay

## 2012-02-02 ENCOUNTER — Ambulatory Visit: Admit: 2012-02-02 | Payer: BC Managed Care – PPO | Admitting: Internal Medicine

## 2012-02-02 SURGERY — COLONOSCOPY
Anesthesia: Moderate Sedation

## 2012-02-27 ENCOUNTER — Other Ambulatory Visit (INDEPENDENT_AMBULATORY_CARE_PROVIDER_SITE_OTHER): Payer: Self-pay | Admitting: Internal Medicine

## 2012-02-28 NOTE — Telephone Encounter (Signed)
Patient will need ov prior to next refill. 

## 2012-04-25 ENCOUNTER — Other Ambulatory Visit (HOSPITAL_COMMUNITY): Payer: Self-pay | Admitting: General Surgery

## 2012-04-25 DIAGNOSIS — Z139 Encounter for screening, unspecified: Secondary | ICD-10-CM

## 2012-05-24 ENCOUNTER — Ambulatory Visit (HOSPITAL_COMMUNITY)
Admission: RE | Admit: 2012-05-24 | Discharge: 2012-05-24 | Disposition: A | Discharge status: Home / Self Care | Payer: BC Managed Care – PPO | Source: Ambulatory Visit | Attending: General Surgery | Admitting: General Surgery

## 2012-05-24 DIAGNOSIS — Z139 Encounter for screening, unspecified: Secondary | ICD-10-CM

## 2012-05-24 DIAGNOSIS — Z1231 Encounter for screening mammogram for malignant neoplasm of breast: Secondary | ICD-10-CM | POA: Insufficient documentation

## 2013-02-19 ENCOUNTER — Other Ambulatory Visit (HOSPITAL_COMMUNITY): Payer: Self-pay | Admitting: Podiatry

## 2013-02-19 DIAGNOSIS — M79609 Pain in unspecified limb: Secondary | ICD-10-CM

## 2013-02-19 DIAGNOSIS — R609 Edema, unspecified: Secondary | ICD-10-CM

## 2013-02-20 ENCOUNTER — Ambulatory Visit (HOSPITAL_COMMUNITY)
Admission: RE | Admit: 2013-02-20 | Discharge: 2013-02-20 | Disposition: A | Discharge status: Home / Self Care | Payer: BC Managed Care – PPO | Source: Ambulatory Visit | Attending: Podiatry | Admitting: Podiatry

## 2013-02-20 DIAGNOSIS — R609 Edema, unspecified: Secondary | ICD-10-CM | POA: Insufficient documentation

## 2013-02-20 DIAGNOSIS — M7989 Other specified soft tissue disorders: Secondary | ICD-10-CM | POA: Insufficient documentation

## 2013-02-20 DIAGNOSIS — M79609 Pain in unspecified limb: Secondary | ICD-10-CM | POA: Insufficient documentation

## 2013-03-11 ENCOUNTER — Other Ambulatory Visit (HOSPITAL_COMMUNITY): Payer: Self-pay | Admitting: Podiatry

## 2013-03-11 DIAGNOSIS — S96912S Strain of unspecified muscle and tendon at ankle and foot level, left foot, sequela: Secondary | ICD-10-CM

## 2013-03-13 ENCOUNTER — Ambulatory Visit (HOSPITAL_COMMUNITY)
Admission: RE | Admit: 2013-03-13 | Discharge: 2013-03-13 | Disposition: A | Discharge status: Home / Self Care | Payer: BC Managed Care – PPO | Source: Ambulatory Visit | Attending: Podiatry | Admitting: Podiatry

## 2013-03-13 ENCOUNTER — Encounter (HOSPITAL_COMMUNITY): Payer: Self-pay

## 2013-03-13 DIAGNOSIS — S96912S Strain of unspecified muscle and tendon at ankle and foot level, left foot, sequela: Secondary | ICD-10-CM

## 2013-03-13 DIAGNOSIS — X58XXXA Exposure to other specified factors, initial encounter: Secondary | ICD-10-CM | POA: Insufficient documentation

## 2013-03-13 DIAGNOSIS — M25473 Effusion, unspecified ankle: Secondary | ICD-10-CM | POA: Insufficient documentation

## 2013-03-13 DIAGNOSIS — S93409A Sprain of unspecified ligament of unspecified ankle, initial encounter: Secondary | ICD-10-CM | POA: Insufficient documentation

## 2013-03-13 DIAGNOSIS — M8430XA Stress fracture, unspecified site, initial encounter for fracture: Secondary | ICD-10-CM | POA: Insufficient documentation

## 2013-03-13 DIAGNOSIS — M25476 Effusion, unspecified foot: Secondary | ICD-10-CM | POA: Insufficient documentation

## 2013-05-14 ENCOUNTER — Other Ambulatory Visit (HOSPITAL_COMMUNITY): Payer: Self-pay | Admitting: General Surgery

## 2013-05-14 DIAGNOSIS — Z139 Encounter for screening, unspecified: Secondary | ICD-10-CM

## 2013-05-27 ENCOUNTER — Ambulatory Visit (HOSPITAL_COMMUNITY)
Admission: RE | Admit: 2013-05-27 | Discharge: 2013-05-27 | Disposition: A | Discharge status: Home / Self Care | Payer: BC Managed Care – PPO | Source: Ambulatory Visit | Attending: General Surgery | Admitting: General Surgery

## 2013-05-27 DIAGNOSIS — Z139 Encounter for screening, unspecified: Secondary | ICD-10-CM

## 2013-05-27 DIAGNOSIS — Z1231 Encounter for screening mammogram for malignant neoplasm of breast: Secondary | ICD-10-CM | POA: Insufficient documentation

## 2013-05-30 ENCOUNTER — Other Ambulatory Visit: Payer: Self-pay | Admitting: General Surgery

## 2013-05-31 ENCOUNTER — Other Ambulatory Visit: Payer: Self-pay | Admitting: General Surgery

## 2013-05-31 DIAGNOSIS — R928 Other abnormal and inconclusive findings on diagnostic imaging of breast: Secondary | ICD-10-CM

## 2013-06-19 ENCOUNTER — Ambulatory Visit (HOSPITAL_COMMUNITY)
Admission: RE | Admit: 2013-06-19 | Discharge: 2013-06-19 | Disposition: A | Discharge status: Home / Self Care | Payer: BC Managed Care – PPO | Source: Ambulatory Visit | Attending: General Surgery | Admitting: General Surgery

## 2013-06-19 ENCOUNTER — Other Ambulatory Visit: Payer: Self-pay | Admitting: General Surgery

## 2013-06-19 DIAGNOSIS — N63 Unspecified lump in unspecified breast: Secondary | ICD-10-CM | POA: Insufficient documentation

## 2013-06-19 DIAGNOSIS — R928 Other abnormal and inconclusive findings on diagnostic imaging of breast: Secondary | ICD-10-CM | POA: Insufficient documentation

## 2013-06-21 ENCOUNTER — Other Ambulatory Visit (HOSPITAL_COMMUNITY): Payer: Self-pay | Admitting: General Surgery

## 2013-06-21 DIAGNOSIS — C50912 Malignant neoplasm of unspecified site of left female breast: Secondary | ICD-10-CM

## 2013-06-25 ENCOUNTER — Ambulatory Visit (HOSPITAL_COMMUNITY): Payer: BC Managed Care – PPO

## 2013-06-25 ENCOUNTER — Ambulatory Visit (HOSPITAL_COMMUNITY)
Admission: RE | Admit: 2013-06-25 | Discharge: 2013-06-25 | Disposition: A | Discharge status: Home / Self Care | Payer: BC Managed Care – PPO | Source: Ambulatory Visit | Attending: General Surgery | Admitting: General Surgery

## 2013-06-25 DIAGNOSIS — C50919 Malignant neoplasm of unspecified site of unspecified female breast: Secondary | ICD-10-CM | POA: Insufficient documentation

## 2013-06-25 DIAGNOSIS — C50912 Malignant neoplasm of unspecified site of left female breast: Secondary | ICD-10-CM

## 2013-06-25 DIAGNOSIS — R599 Enlarged lymph nodes, unspecified: Secondary | ICD-10-CM | POA: Insufficient documentation

## 2013-06-25 DIAGNOSIS — R918 Other nonspecific abnormal finding of lung field: Secondary | ICD-10-CM | POA: Insufficient documentation

## 2013-06-25 DIAGNOSIS — K8020 Calculus of gallbladder without cholecystitis without obstruction: Secondary | ICD-10-CM | POA: Insufficient documentation

## 2013-06-25 MED ORDER — IOHEXOL 300 MG/ML  SOLN
100.0000 mL | Freq: Once | INTRAMUSCULAR | Status: AC | PRN
  Administered 2013-06-25: 100 mL via INTRAVENOUS

## 2013-06-28 ENCOUNTER — Other Ambulatory Visit (HOSPITAL_COMMUNITY): Payer: Self-pay | Admitting: General Surgery

## 2013-06-28 DIAGNOSIS — N63 Unspecified lump in unspecified breast: Secondary | ICD-10-CM

## 2013-07-01 NOTE — Patient Instructions (Signed)
Heather Vaughan  07/01/2013   Your procedure is scheduled on:  07/03/13  Report to [facility] at 0800 AM.  Call this number if you have problems the morning of surgery: [phone]   Remember:   Do not eat food or drink liquids after midnight.   Take these medicines the morning of surgery with A SIP OF WATER: nexium, hyzaar   Do not wear jewelry, make-up or nail polish.  Do not wear lotions, powders, or perfumes. You may wear deodorant.  Do not shave 48 hours prior to surgery. Men may shave face and neck.  Do not bring valuables to the hospital.  [facility] is not responsible                  for any belongings or valuables.               Contacts, dentures or bridgework may not be worn into surgery.  Leave suitcase in the car. After surgery it may be brought to your room.  For patients admitted to the hospital, discharge time is determined by your                treatment team.               Patients discharged the day of surgery will not be allowed to drive  home.  Name and phone number of your driver: family  Special Instructions: Shower using CHG 2 nights before surgery and the night before surgery.  If you shower the day of surgery use CHG.  Use special wash - you have one bottle of CHG for all showers.  You should use approximately 1/3 of the bottle for each shower.   Please read over the following fact sheets that you were given: Pain Booklet, Surgical Site Infection Prevention, Anesthesia Post-op Instructions and Care and Recovery After Surgery   PATIENT INSTRUCTIONS POST-ANESTHESIA  IMMEDIATELY FOLLOWING SURGERY:  Do not drive or operate machinery for the first twenty four hours after surgery.  Do not make any important decisions for twenty four hours after surgery or while taking narcotic pain medications or sedatives.  If you develop intractable nausea and vomiting or a severe headache please notify your doctor immediately.  FOLLOW-UP:  Please make an appointment with  your surgeon as instructed. You do not need to follow up with anesthesia unless specifically instructed to do so.  WOUND CARE INSTRUCTIONS (if applicable):  Keep a dry clean dressing on the anesthesia/puncture wound site if there is drainage.  Once the wound has quit draining you may leave it open to air.  Generally you should leave the bandage intact for twenty four hours unless there is drainage.  If the epidural site drains for more than 36-48 hours please call the anesthesia department.  QUESTIONS?:  Please feel free to call your physician or the hospital operator if you have any questions, and they will be happy to assist you.      Mastectomy, With or Without Reconstruction Mastectomy (removal of the breast) is a procedure most commonly used to treat cancer (tumor) of the breast. Different procedures are available for treatment. This depends on the stage of the tumor (abnormal growths). Discuss this with your caregiver, surgeon (a specialist for performing operations such as this), or oncologist (someone specialized in the treatment of cancer). With proper information, you can decide which treatment is best for you. Although the sound of the word cancer is frightening to all of [place], the  new treatments and medications can be a source of reassurance and comfort. If there are things you are worried about, discuss them with your caregiver. He or she can help comfort you and your family. Some of the different procedures for treating breast cancer are:  Radical (extensive) mastectomy. This is an operation used to remove the entire breast, the muscles under the breast, and all of the glands (lymph nodes) under the arm. With all of the new treatments available for cancer of the breast, this procedure has become less common.  Modified radical mastectomy. This is a similar operation to the radical mastectomy described above. In the modified radical mastectomy, the muscles of the chest wall are not removed  unless one of the lessor muscles is removed. One of the lessor muscles may be removed to allow better removal of the lymph nodes. The axillary lymph nodes are also removed. Rarely, during an axillary node dissection nerves to this area are damaged. Radiation therapy is then often used to the area following this surgery.  A total mastectomy also known as a complete or simple mastectomy. It involves removal of only the breast. The lymph nodes and the muscles are left in place.  In a lumpectomy, the lump is removed from the breast. This is the simplest form of surgical treatment. A sentinel lymph node biopsy may also be done. Additional treatment may be required. RISKS AND COMPLICATIONS The main problems that follow removal of the breast include:  Infection (germs start growing in the wound). This can usually be treated with antibiotics (medications that kill germs).  Lymphedema. This means the arm on the side of the breast that was operated on swells because the lymph (tissue fluid) cannot follow the main channels back into the body. This only occurs when the lymph nodes have had to be removed under the arm.  There may be some areas of numbness to the upper arm and around the incision (cut by the surgeon) in the breast. This happens because of the cutting of or damage to some of the nerves in the area. This is most often unavoidable.  There may be difficulty moving the arm in a full range of motion (moving in all directions) following surgery. This usually improves with time following use and exercise.  Recurrence of breast cancer may happen with the very best of surgery and follow up treatment. Sometimes small cancer cells that cannot be seen with the naked eye have already spread at the time of surgery. When this happens other treatment is available. This treatment may be radiation, medications or a combination of both. RECONSTRUCTION Reconstruction of the breast may be done immediately if there is  not going to be post-operative radiation. This surgery is done for cosmetic (improve appearance) purposes to improve the physical appearance after the operation. This may be done in two ways:  It can be done using a saline filled prosthetic (an artificial breast which is filled with salt water). Silicone breast implants are now re-approved by the FDA and are being commonly used.  Reconstruction can be done using the body's own muscle/fat/skin. Your caregiver will discuss your options with you. Depending upon your needs or choice, together you will be able to determine which procedure is best for you. Document Released: 05/10/2001 Document Revised: 05/09/2012 Document Reviewed: 01/01/2008 [facility] Patient Information 2014 [place], [place].

## 2013-07-02 ENCOUNTER — Encounter (HOSPITAL_COMMUNITY): Payer: Self-pay

## 2013-07-02 ENCOUNTER — Encounter (HOSPITAL_COMMUNITY)
Admission: RE | Admit: 2013-07-02 | Discharge: 2013-07-02 | Disposition: A | Discharge status: Home / Self Care | Payer: BC Managed Care – PPO | Source: Ambulatory Visit | Attending: General Surgery | Admitting: General Surgery

## 2013-07-02 LAB — BASIC METABOLIC PANEL
BUN: 14 mg/dL (ref 6–23)
Calcium: 10.2 mg/dL (ref 8.4–10.5)
Creatinine, Ser: 0.71 mg/dL (ref 0.50–1.10)
GFR calc Af Amer: >90 mL/min (ref >90)
GFR calc non Af Amer: >90 mL/min (ref >90)
Glucose, Bld: 104 mg/dL — ABNORMAL HIGH (ref 70–99)
Potassium: 3.8 mEq/L (ref 3.5–5.1)

## 2013-07-02 LAB — CBC WITH DIFFERENTIAL/PLATELET
Basophils Relative: 1 % (ref 0–1)
Eosinophils Absolute: 0.4 10*3/uL (ref 0.0–0.7)
Eosinophils Relative: 6 % — ABNORMAL HIGH (ref 0–5)
Hemoglobin: 12.8 g/dL (ref 12.0–15.0)
Lymphs Abs: 2.2 10*3/uL (ref 0.7–4.0)
MCH: 33 pg (ref 26.0–34.0)
MCHC: 33.4 g/dL (ref 30.0–36.0)
MCV: 98.7 fL (ref 78.0–100.0)
Monocytes Relative: 7 % (ref 3–12)
Neutrophils Relative %: 54 % (ref 43–77)

## 2013-07-03 ENCOUNTER — Encounter (HOSPITAL_COMMUNITY)
Admission: RE | Disposition: A | Discharge status: Home / Self Care | Payer: Self-pay | Source: Ambulatory Visit | Attending: General Surgery

## 2013-07-03 ENCOUNTER — Ambulatory Visit (HOSPITAL_COMMUNITY): Payer: BC Managed Care – PPO

## 2013-07-03 ENCOUNTER — Encounter (HOSPITAL_COMMUNITY): Payer: Self-pay | Admitting: *Deleted

## 2013-07-03 ENCOUNTER — Ambulatory Visit (HOSPITAL_COMMUNITY): Payer: BC Managed Care – PPO | Admitting: Anesthesiology

## 2013-07-03 ENCOUNTER — Ambulatory Visit (HOSPITAL_COMMUNITY)
Admission: RE | Admit: 2013-07-03 | Discharge: 2013-07-03 | Disposition: A | Discharge status: Home / Self Care | Payer: BC Managed Care – PPO | Source: Ambulatory Visit | Attending: General Surgery | Admitting: General Surgery

## 2013-07-03 ENCOUNTER — Encounter (HOSPITAL_COMMUNITY): Payer: BC Managed Care – PPO | Admitting: Anesthesiology

## 2013-07-03 DIAGNOSIS — C50919 Malignant neoplasm of unspecified site of unspecified female breast: Secondary | ICD-10-CM | POA: Insufficient documentation

## 2013-07-03 DIAGNOSIS — D0590 Unspecified type of carcinoma in situ of unspecified breast: Secondary | ICD-10-CM | POA: Insufficient documentation

## 2013-07-03 DIAGNOSIS — N63 Unspecified lump in unspecified breast: Secondary | ICD-10-CM

## 2013-07-03 DIAGNOSIS — I10 Essential (primary) hypertension: Secondary | ICD-10-CM | POA: Insufficient documentation

## 2013-07-03 HISTORY — PX: PARTIAL MASTECTOMY WITH NEEDLE LOCALIZATION: SHX6008

## 2013-07-03 SURGERY — PARTIAL MASTECTOMY WITH NEEDLE LOCALIZATION
Anesthesia: General | Site: Breast | Laterality: Left | Wound class: Clean

## 2013-07-03 MED ORDER — SUCCINYLCHOLINE CHLORIDE 20 MG/ML IJ SOLN
INTRAMUSCULAR | Status: DC | PRN
  Administered 2013-07-03: 80 mg via INTRAVENOUS

## 2013-07-03 MED ORDER — PROPOFOL 10 MG/ML IV BOLUS
INTRAVENOUS | Status: DC | PRN
  Administered 2013-07-03: 120 mg via INTRAVENOUS

## 2013-07-03 MED ORDER — ONDANSETRON HCL 4 MG/2ML IJ SOLN
4.0000 mg | Freq: Once | INTRAMUSCULAR | Status: AC
  Administered 2013-07-03: 4 mg via INTRAVENOUS

## 2013-07-03 MED ORDER — BUPIVACAINE HCL (PF) 0.5 % IJ SOLN
INTRAMUSCULAR | Status: AC
  Filled 2013-07-03: qty 30

## 2013-07-03 MED ORDER — MIDAZOLAM HCL 2 MG/2ML IJ SOLN
INTRAMUSCULAR | Status: AC
  Filled 2013-07-03: qty 2

## 2013-07-03 MED ORDER — FENTANYL CITRATE 0.05 MG/ML IJ SOLN
25.0000 ug | INTRAMUSCULAR | Status: AC
  Administered 2013-07-03 (×2): 25 ug via INTRAVENOUS

## 2013-07-03 MED ORDER — BACITRACIN-NEOMYCIN-POLYMYXIN 400-5-5000 EX OINT
TOPICAL_OINTMENT | CUTANEOUS | Status: AC
  Filled 2013-07-03: qty 1

## 2013-07-03 MED ORDER — ROCURONIUM BROMIDE 100 MG/10ML IV SOLN
INTRAVENOUS | Status: DC | PRN
  Administered 2013-07-03: 5 mg via INTRAVENOUS

## 2013-07-03 MED ORDER — FENTANYL CITRATE 0.05 MG/ML IJ SOLN
INTRAMUSCULAR | Status: AC
  Filled 2013-07-03: qty 5

## 2013-07-03 MED ORDER — CEFAZOLIN SODIUM-DEXTROSE 2-3 GM-% IV SOLR
2.0000 g | Freq: Once | INTRAVENOUS | Status: AC
  Administered 2013-07-03: 2 g via INTRAVENOUS

## 2013-07-03 MED ORDER — LIDOCAINE HCL 1 % IJ SOLN
INTRAMUSCULAR | Status: DC | PRN
  Administered 2013-07-03: 40 mg via INTRADERMAL

## 2013-07-03 MED ORDER — STERILE WATER FOR IRRIGATION IR SOLN
Status: DC | PRN
  Administered 2013-07-03 (×2): 1000 mL

## 2013-07-03 MED ORDER — PHENYLEPHRINE HCL 10 MG/ML IJ SOLN
INTRAMUSCULAR | Status: DC | PRN
  Administered 2013-07-03 (×2): 100 ug via INTRAVENOUS

## 2013-07-03 MED ORDER — FENTANYL CITRATE 0.05 MG/ML IJ SOLN
INTRAMUSCULAR | Status: AC
  Filled 2013-07-03: qty 2

## 2013-07-03 MED ORDER — OXYCODONE-ACETAMINOPHEN 10-325 MG PO TABS: 1.0000 | ORAL_TABLET | ORAL | Status: DC | PRN

## 2013-07-03 MED ORDER — ONDANSETRON HCL 4 MG/2ML IJ SOLN: 4.0000 mg | Freq: Once | INTRAMUSCULAR | Status: DC | PRN

## 2013-07-03 MED ORDER — SODIUM CHLORIDE BACTERIOSTATIC 0.9 % IJ SOLN
INTRAMUSCULAR | Status: AC
  Filled 2013-07-03: qty 10

## 2013-07-03 MED ORDER — STERILE WATER FOR IRRIGATION IR SOLN
Status: DC | PRN
  Administered 2013-07-03: 1000 mL

## 2013-07-03 MED ORDER — FENTANYL CITRATE 0.05 MG/ML IJ SOLN
INTRAMUSCULAR | Status: DC | PRN
  Administered 2013-07-03 (×2): 50 ug via INTRAVENOUS

## 2013-07-03 MED ORDER — ONDANSETRON HCL 4 MG/2ML IJ SOLN
INTRAMUSCULAR | Status: AC
  Filled 2013-07-03: qty 2

## 2013-07-03 MED ORDER — MIDAZOLAM HCL 2 MG/2ML IJ SOLN
1.0000 mg | INTRAMUSCULAR | Status: DC | PRN
  Administered 2013-07-03: 2 mg via INTRAVENOUS

## 2013-07-03 MED ORDER — EPHEDRINE SULFATE 50 MG/ML IJ SOLN
INTRAMUSCULAR | Status: DC | PRN
  Administered 2013-07-03: 20 mg via INTRAVENOUS
  Administered 2013-07-03 (×2): 10 mg via INTRAVENOUS

## 2013-07-03 MED ORDER — PROPOFOL 10 MG/ML IV EMUL
INTRAVENOUS | Status: AC
  Filled 2013-07-03: qty 20

## 2013-07-03 MED ORDER — ROCURONIUM BROMIDE 50 MG/5ML IV SOLN
INTRAVENOUS | Status: AC
  Filled 2013-07-03: qty 1

## 2013-07-03 MED ORDER — EPHEDRINE SULFATE 50 MG/ML IJ SOLN
INTRAMUSCULAR | Status: AC
  Filled 2013-07-03: qty 1

## 2013-07-03 MED ORDER — BUPIVACAINE HCL (PF) 0.5 % IJ SOLN
INTRAMUSCULAR | Status: DC | PRN
  Administered 2013-07-03: 10 mL

## 2013-07-03 MED ORDER — LIDOCAINE HCL (PF) 1 % IJ SOLN
INTRAMUSCULAR | Status: AC
  Filled 2013-07-03: qty 5

## 2013-07-03 MED ORDER — LACTATED RINGERS IV SOLN
INTRAVENOUS | Status: DC
  Administered 2013-07-03 (×2): via INTRAVENOUS

## 2013-07-03 MED ORDER — LIDOCAINE HCL (PF) 2 % IJ SOLN
INTRAMUSCULAR | Status: AC
  Filled 2013-07-03: qty 10

## 2013-07-03 MED ORDER — FENTANYL CITRATE 0.05 MG/ML IJ SOLN
25.0000 ug | INTRAMUSCULAR | Status: DC | PRN
  Administered 2013-07-03 (×2): 50 ug via INTRAVENOUS

## 2013-07-03 MED ORDER — BACITRACIN-NEOMYCIN-POLYMYXIN 400-5-5000 EX OINT
TOPICAL_OINTMENT | CUTANEOUS | Status: DC | PRN
  Administered 2013-07-03: 1 via TOPICAL

## 2013-07-03 MED ORDER — LIDOCAINE HCL (PF) 1 % IJ SOLN
INTRAMUSCULAR | Status: AC
  Filled 2013-07-03: qty 10

## 2013-07-03 MED ORDER — SUCCINYLCHOLINE CHLORIDE 20 MG/ML IJ SOLN
INTRAMUSCULAR | Status: AC
  Filled 2013-07-03: qty 1

## 2013-07-03 MED ORDER — CEFAZOLIN SODIUM-DEXTROSE 2-3 GM-% IV SOLR
INTRAVENOUS | Status: AC
  Filled 2013-07-03: qty 50

## 2013-07-03 SURGICAL SUPPLY — 43 items
ATTRACTOMAT 16X20 MAGNETIC DRP (DRAPES) ×2 IMPLANT
BAG HAMPER (MISCELLANEOUS) ×2 IMPLANT
BLADE SURG 15 STRL LF DISP TIS (BLADE) ×3 IMPLANT
BLADE SURG 15 STRL SS (BLADE) ×3
CLEANER TIP ELECTROSURG 2X2 (MISCELLANEOUS) ×2 IMPLANT
CLOTH BEACON ORANGE TIMEOUT ST (SAFETY) ×2 IMPLANT
COVER LIGHT HANDLE STERIS (MISCELLANEOUS) ×4 IMPLANT
DECANTER SPIKE VIAL GLASS SM (MISCELLANEOUS) ×2 IMPLANT
DRAPE PROXIMA HALF (DRAPES) ×2 IMPLANT
ELECT REM PT RETURN 9FT ADLT (ELECTROSURGICAL) ×2
ELECTRODE REM PT RTRN 9FT ADLT (ELECTROSURGICAL) ×1 IMPLANT
GLOVE BIOGEL PI IND STRL 7.0 (GLOVE) ×3 IMPLANT
GLOVE BIOGEL PI INDICATOR 7.0 (GLOVE) ×3
GLOVE ECLIPSE 6.5 STRL STRAW (GLOVE) ×2 IMPLANT
GLOVE EXAM NITRILE MD LF STRL (GLOVE) ×2 IMPLANT
GLOVE SKINSENSE NS SZ7.0 (GLOVE) ×1
GLOVE SKINSENSE STRL SZ7.0 (GLOVE) ×1 IMPLANT
GLOVE SS BIOGEL STRL SZ 6.5 (GLOVE) ×1 IMPLANT
GLOVE SUPERSENSE BIOGEL SZ 6.5 (GLOVE) ×1
GOWN STRL REIN XL XLG (GOWN DISPOSABLE) ×8 IMPLANT
INST SET MINOR GENERAL (KITS) ×2 IMPLANT
KIT ROOM TURNOVER APOR (KITS) ×2 IMPLANT
MANIFOLD NEPTUNE II (INSTRUMENTS) ×2 IMPLANT
MARKER SKIN DUAL TIP RULER LAB (MISCELLANEOUS) ×2 IMPLANT
NEEDLE HYPO 18GX1.5 BLUNT FILL (NEEDLE) IMPLANT
NEEDLE HYPO 25X1 1.5 SAFETY (NEEDLE) IMPLANT
PACK MINOR (CUSTOM PROCEDURE TRAY) ×2 IMPLANT
PAD ARMBOARD 7.5X6 YLW CONV (MISCELLANEOUS) ×2 IMPLANT
SET BASIN LINEN APH (SET/KITS/TRAYS/PACK) ×2 IMPLANT
SOL PREP PROV IODINE SCRUB 4OZ (MISCELLANEOUS) ×2 IMPLANT
SPONGE GAUZE 4X4 12PLY (GAUZE/BANDAGES/DRESSINGS) ×2 IMPLANT
SPONGE LAP 18X18 X RAY DECT (DISPOSABLE) ×2 IMPLANT
STRIP CLOSURE SKIN 1/4X3 (GAUZE/BANDAGES/DRESSINGS) ×2 IMPLANT
SUT VIC AB 3-0 SH 27 (SUTURE) ×1
SUT VIC AB 3-0 SH 27X BRD (SUTURE) ×1 IMPLANT
SUT VIC AB 5-0 P-3 18X BRD (SUTURE) ×1 IMPLANT
SUT VIC AB 5-0 P3 18 (SUTURE) ×1
SUT VICRYL AB 3 0 TIES (SUTURE) ×2 IMPLANT
SYR BULB IRRIGATION 50ML (SYRINGE) ×4 IMPLANT
SYR CONTROL 10ML LL (SYRINGE) ×2 IMPLANT
TAPE PAPER 3X10 WHT MICROPORE (GAUZE/BANDAGES/DRESSINGS) ×2 IMPLANT
TOWEL OR 17X26 4PK STRL BLUE (TOWEL DISPOSABLE) IMPLANT
WATER STERILE IRR 1000ML POUR (IV SOLUTION) ×4 IMPLANT

## 2013-07-03 NOTE — Progress Notes (Signed)
Admit via OP dept. 56 yr. Old W. Female with abnl. Left mammogram for needle loc and biopsy to R/O CA of breast.  Procedure and risks explained in detail and informed consent obtained. Labs reviewed.  Breast marked and case discussed with radiologist.  No clinical change in H&P. Dict # [id number].  Filed Vitals:   07/03/13 1025  BP: 180/95  Temp:   Resp: 16  BP 144/76 temp 98, O2 sat 98% on 2L/min.

## 2013-07-03 NOTE — Anesthesia Postprocedure Evaluation (Signed)
  Anesthesia Post-op Note  Patient: Heather Vaughan  Procedure(s) Performed: Procedure(s): PARTIAL MASTECTOMY STATUS POST NEEDLE LOCALIZATION (Left)  Patient Location: PACU  Anesthesia Type:General  Level of Consciousness: awake, alert  and oriented  Airway and Oxygen Therapy: Patient Spontanous Breathing and Patient connected to face mask oxygen  Post-op Pain: mild  Post-op Assessment: Post-op Vital signs reviewed, Patient's Cardiovascular Status Stable, Respiratory Function Stable, Patent Airway and No signs of Nausea or vomiting  Post-op Vital Signs: Reviewed and stable  Complications: No apparent anesthesia complications

## 2013-07-03 NOTE — OR Nursing (Signed)
To bathroom to void

## 2013-07-03 NOTE — Consult Note (Signed)
NAME:  Heather Vaughan, Heather Vaughan                   ACCOUNT NO.:  MEDICAL RECORD NO.:  [id number]  LOCATION:                                 FACILITY:  PHYSICIAN:  Barbaraann Barthel, M.D. DATE OF BIRTH:  [birth year]  DATE OF CONSULTATION:  06/28/2013 DATE OF DISCHARGE:                                CONSULTATION   NOTE:  Surgery was asked to see this 57 year old white female with an abnormal left mammogram.  She had a mammogram, which revealed an area of suspicion in the central portion of her left breast.  This measured 1 x 1 x 1.2 cm.  She also had preoperatively CT scan of the chest, abdomen, and pelvis as there was some question of some adenopathy in her left side, this however was not overly suspicious for metastatic disease.  We discussed the need for biopsy to rule out carcinoma.  We have planned for a needle localization and biopsy to be done via the outpatient department.  We will proceed with further work after we have a pathological diagnosis.  We discussed surgery in detail.  We will discuss this further with her as well.  PAST MEDICAL HISTORY:  Positive for hypertension, anxiety, GERD, and she also was found on workup to have asymptomatic gallstones.  PAST SURGERIES:  Morton's neuroma surgery on her right foot.  For her medication list, see medications and she has no known allergies and she is a nonsmoker, nondrinker.  PHYSICAL EXAMINATION:  GENERAL:  No acute distress. VITAL SIGNS:  She is 5 feet 2 inches, weighs 147 pounds, temperature is 98.5, pulse rate is 96, respirations 12, blood pressure 160/90. HEENT:  Head is normocephalic.  Eyes, extraocular movements are intact. Pupils are round and react to light and accommodation.  No bruits are appreciated.  No adenopathy.  No jugular vein distention or thyromegaly. The patient does have a dental prosthesis. CHEST:  Clear both anterior and posterior auscultation. HEART:  Regular rhythm. BREASTS AND AXILLA:  No masses are  palpated. ABDOMEN:  Soft.  No visceromegaly.  No inguinal hernias or masses. RECTAL:  Deferred. EXTREMITIES:  She has had past right foot surgery.  REVIEW OF SYSTEMS:  NEURO:  History of anxiety, no lateralizing neurological findings.  No history of migraines or seizures.  ENDOCRINE: No history of diabetes, thyroid disease, or adrenal problems. CARDIOPULMONARY:  She has a positive history of hypertension. MUSCULOSKELETAL:  She has some tenderness in both of her feet.  OB/GYN: She is a gravida 1, para 1, cesarean 0, abortus 0 female whose last menstrual period was 10 years ago and she has no family history of carcinoma of the breast.  Recent mammogram is suspicious for carcinoma of the left breast.  GI:  She has history of GERD.  No past history of hepatitis, constipation, diarrhea, bright red rectal bleeding, melena, or inflammatory bowel disease.  No history of unexplained weight loss. No history of irritable bowel syndrome.  She has had a colonoscopy 10 or 11 years ago.  The patient was found to have asymptomatic gallstones. GU:  No history of dysuria or frequency or kidney stones.  REVIEW OF HISTORY AND PHYSICAL:  Therefore, Mr. Baine  is a 57 year old, white female who has a suspicious left mammogram.  We will perform needle localization and biopsy and proceed with further treatment when the results of this are unknown.     Barbaraann Barthel, M.D.     WB/MEDQ  D:  06/28/2013  T:  06/29/2013  Job:  [id number]

## 2013-07-03 NOTE — Transfer of Care (Signed)
Immediate Anesthesia Transfer of Care Note  Patient: Heather Vaughan  Procedure(s) Performed: Procedure(s): PARTIAL MASTECTOMY STATUS POST NEEDLE LOCALIZATION (Left)  Patient Location: PACU  Anesthesia Type:General  Level of Consciousness: awake, alert  and oriented  Airway & Oxygen Therapy: Patient Spontanous Breathing and Patient connected to face mask oxygen  Post-op Assessment: Report given to PACU RN  Post vital signs: Reviewed and stable  Complications: No apparent anesthesia complications

## 2013-07-03 NOTE — Anesthesia Procedure Notes (Addendum)
Procedure Name: Intubation Date/Time: 07/03/2013 11:10 AM Performed by: Glynn Octave E Pre-anesthesia Checklist: Patient identified, Patient being monitored, Timeout performed, Emergency Drugs available and Suction available Patient Re-evaluated:Patient Re-evaluated prior to inductionOxygen Delivery Method: Circle System Utilized Preoxygenation: Pre-oxygenation with 100% oxygen Intubation Type: IV induction, Rapid sequence and Cricoid Pressure applied Ventilation: Mask ventilation without difficulty Laryngoscope Size: Mac and 3 Grade View: Grade I Tube type: Oral Tube size: 7.0 mm Number of attempts: 1 Airway Equipment and Method: stylet Placement Confirmation: ETT inserted through vocal cords under direct vision,  positive ETCO2 and breath sounds checked- equal and bilateral Secured at: 21 cm Tube secured with: Tape Dental Injury: Teeth and Oropharynx as per pre-operative assessment

## 2013-07-03 NOTE — Brief Op Note (Signed)
07/03/2013  12:11 PM  PATIENT:  Heather Vaughan  57 y.o. female  PRE-OPERATIVE DIAGNOSIS:  abnormal mammogram   POST-OPERATIVE DIAGNOSIS:  abnormal mammogram   PROCEDURE:  Procedure(s): PARTIAL MASTECTOMY STATUS POST NEEDLE LOCALIZATION (Left)  SURGEON:  Surgeon(s) and Role:    * Marlane Hatcher, MD - Primary  PHYSICIAN ASSISTANT:   ASSISTANTS: none   ANESTHESIA:   general  EBL:  Total I/O In: 1000 [I.V.:1000] Out: 0   BLOOD ADMINISTERED:none  DRAINS: none   LOCAL MEDICATIONS USED:  NONE  SPECIMEN:  Source of Specimen:  Left breast.  DISPOSITION OF SPECIMEN:  PATHOLOGY  COUNTS:  YES  TOURNIQUET:  * No tourniquets in log *  DICTATION: .Other Dictation: Dictation Number OR dict. # [id number].  PLAN OF CARE: Discharge to home after PACU  PATIENT DISPOSITION:  PACU - hemodynamically stable.   Delay start of Pharmacological VTE agent (>24hrs) due to surgical blood loss or risk of bleeding: not applicable

## 2013-07-03 NOTE — Anesthesia Preprocedure Evaluation (Signed)
Anesthesia Evaluation  Patient identified by MRN, date of birth, ID band Patient awake    Reviewed: Allergy & Precautions, H&P , NPO status , Patient's Chart, lab work & pertinent test results  History of Anesthesia Complications Negative for: history of anesthetic complications  Airway Mallampati: I      Dental  (+) Partial Upper and Teeth Intact   Pulmonary former smoker,    Pulmonary exam normal       Cardiovascular hypertension, Pt. on medications Rhythm:Regular Rate:Normal     Neuro/Psych PSYCHIATRIC DISORDERS Depression    GI/Hepatic GERD-  Medicated and Controlled,  Endo/Other    Renal/GU      Musculoskeletal   Abdominal   Peds  Hematology   Anesthesia Other Findings   Reproductive/Obstetrics                           Anesthesia Physical Anesthesia Plan  ASA: II  Anesthesia Plan: General   Post-op Pain Management:    Induction: Intravenous, Rapid sequence and Cricoid pressure planned  Airway Management Planned: Oral ETT  Additional Equipment:   Intra-op Plan:   Post-operative Plan: Extubation in OR  Informed Consent: I have reviewed the patients History and Physical, chart, labs and discussed the procedure including the risks, benefits and alternatives for the proposed anesthesia with the patient or authorized representative who has indicated his/her understanding and acceptance.     Plan Discussed with:   Anesthesia Plan Comments:         Anesthesia Quick Evaluation  

## 2013-07-03 NOTE — OR Nursing (Signed)
Returned back to preop 3 from Mamma ( needle loc)

## 2013-07-04 NOTE — Op Note (Signed)
NAME:  Heather Vaughan, Heather Vaughan              ACCOUNT NO.:  [id number]  MEDICAL RECORD NO.:  [id number]  LOCATION:  [facility]                          FACILITY:  [facility]  PHYSICIAN:  Barbaraann Barthel, M.D. DATE OF BIRTH:  Mar 05, 1956  DATE OF PROCEDURE:  07/03/2013 DATE OF DISCHARGE:  07/03/2013                              OPERATIVE REPORT   DIAGNOSIS:  Abnormal left mammogram.  PROCEDURE:  Needle localization with left partial mastectomy.  SPECIMEN:  Left breast tissue.  WOUND CLASSIFICATION:  Clean.  NOTE:  This is a 57 year old white female who has no family history of first line relatives with carcinoma of the breast who has been followed for an abnormal left mammogram.  She was referred when she was noted to have an area of suspicion with microcalcifications and palpable mass in her left breast at approximately the 2 o'clock position about a cm or so from her left nipple areolar complex.  We admitted her via the outpatient department for a needle localization and biopsy.  This was done and the mammogram specimen revealed that the calcifications that were noted were included as well as the area of suspicion.  TECHNIQUE:  The patient was placed in the supine position.  After the adequate administration of general anesthesia via inhalation anesthesia her left hemithorax was prepped with Betadine solution and draped in usual manner.  A periareolar incision was carried out over the superior lateral aspect of the left nipple areolar complex.  We dissected down around the wire retrieving the wire so that it came through the incision around the nipple-areolar complex.  We then dissected around this completely and I did palpate a serous hard area.  There was also a breast cyst near this area as well.  This was all included in the specimen, then we sent for specimen mammography which as stated above contained the areas of suspicion.  Once this was removed, we irrigated with normal saline solution.   We irrigated with sterile water.  We then controlled the bleeding with a cautery device and I approximated the breast tissue with 3-0 Polysorb and repaired the nipple-areolar complex with a subcuticular 5-0 Polysorb suture with quarter-inch Steri-Strips used to further approximate the skin.  No drains were placed.  There were no complications.  The patient received about 1200 mL of crystalloids intraoperatively.  Estimated blood loss was less than 25 mL.  There were no complications.     Barbaraann Barthel, M.D.     WB/MEDQ  D:  07/03/2013  T:  07/04/2013  Job:  [id number]  cc:   [facility]

## 2013-07-05 ENCOUNTER — Encounter (HOSPITAL_COMMUNITY): Payer: Self-pay | Admitting: General Surgery

## 2013-07-11 NOTE — Patient Instructions (Signed)
TATIYANA FOUCHER  07/11/2013   Your procedure is scheduled on:   07/17/2013  Report to [facility] at  615  AM.  Call this number if you have problems the morning of surgery: [phone]   Remember:   Do not eat food or drink liquids after midnight.   Take these medicines the morning of surgery with A SIP OF WATER: cymbalta, hyzaar, nexium, percocet   Do not wear jewelry, make-up or nail polish.  Do not wear lotions, powders, or perfumes.  Do not shave 48 hours prior to surgery. Men may shave face and neck.  Do not bring valuables to the hospital.  [facility] is not responsible  for any belongings or valuables.               Contacts, dentures or bridgework may not be worn into surgery.  Leave suitcase in the car. After surgery it may be brought to your room.  For patients admitted to the hospital, discharge time is determined by your treatment team.               Patients discharged the day of surgery will not be allowed to drive home.  Name and phone number of your driver: family  Special Instructions: Shower using CHG 2 nights before surgery and the night before surgery.  If you shower the day of surgery use CHG.  Use special wash - you have one bottle of CHG for all showers.  You should use approximately 1/3 of the bottle for each shower.   Please read over the following fact sheets that you were given: Pain Booklet, Coughing and Deep Breathing, Surgical Site Infection Prevention, Anesthesia Post-op Instructions and Care and Recovery After Surgery Mastectomy, With or Without Reconstruction Mastectomy (removal of the breast) is a procedure most commonly used to treat cancer (tumor) of the breast. Different procedures are available for treatment. This depends on the stage of the tumor (abnormal growths). Discuss this with your caregiver, surgeon (a specialist for performing operations such as this), or oncologist (someone specialized in the treatment of cancer). With proper  information, you can decide which treatment is best for you. Although the sound of the word cancer is frightening to all of [place], the new treatments and medications can be a source of reassurance and comfort. If there are things you are worried about, discuss them with your caregiver. He or she can help comfort you and your family. Some of the different procedures for treating breast cancer are:  Radical (extensive) mastectomy. This is an operation used to remove the entire breast, the muscles under the breast, and all of the glands (lymph nodes) under the arm. With all of the new treatments available for cancer of the breast, this procedure has become less common.  Modified radical mastectomy. This is a similar operation to the radical mastectomy described above. In the modified radical mastectomy, the muscles of the chest wall are not removed unless one of the lessor muscles is removed. One of the lessor muscles may be removed to allow better removal of the lymph nodes. The axillary lymph nodes are also removed. Rarely, during an axillary node dissection nerves to this area are damaged. Radiation therapy is then often used to the area following this surgery.  A total mastectomy also known as a complete or simple mastectomy. It involves removal of only the breast. The lymph nodes and the muscles are left in place.  In a lumpectomy, the lump  is removed from the breast. This is the simplest form of surgical treatment. A sentinel lymph node biopsy may also be done. Additional treatment may be required. RISKS AND COMPLICATIONS The main problems that follow removal of the breast include:  Infection (germs start growing in the wound). This can usually be treated with antibiotics (medications that kill germs).  Lymphedema. This means the arm on the side of the breast that was operated on swells because the lymph (tissue fluid) cannot follow the main channels back into the body. This only occurs when the lymph  nodes have had to be removed under the arm.  There may be some areas of numbness to the upper arm and around the incision (cut by the surgeon) in the breast. This happens because of the cutting of or damage to some of the nerves in the area. This is most often unavoidable.  There may be difficulty moving the arm in a full range of motion (moving in all directions) following surgery. This usually improves with time following use and exercise.  Recurrence of breast cancer may happen with the very best of surgery and follow up treatment. Sometimes small cancer cells that cannot be seen with the naked eye have already spread at the time of surgery. When this happens other treatment is available. This treatment may be radiation, medications or a combination of both. RECONSTRUCTION Reconstruction of the breast may be done immediately if there is not going to be post-operative radiation. This surgery is done for cosmetic (improve appearance) purposes to improve the physical appearance after the operation. This may be done in two ways:  It can be done using a saline filled prosthetic (an artificial breast which is filled with salt water). Silicone breast implants are now re-approved by the FDA and are being commonly used.  Reconstruction can be done using the body's own muscle/fat/skin. Your caregiver will discuss your options with you. Depending upon your needs or choice, together you will be able to determine which procedure is best for you. Document Released: 05/10/2001 Document Revised: 05/09/2012 Document Reviewed: 01/01/2008 [facility] Patient Information 2014 [place], [place]. PATIENT INSTRUCTIONS POST-ANESTHESIA  IMMEDIATELY FOLLOWING SURGERY:  Do not drive or operate machinery for the first twenty four hours after surgery.  Do not make any important decisions for twenty four hours after surgery or while taking narcotic pain medications or sedatives.  If you develop intractable nausea and vomiting or  a severe headache please notify your doctor immediately.  FOLLOW-UP:  Please make an appointment with your surgeon as instructed. You do not need to follow up with anesthesia unless specifically instructed to do so.  WOUND CARE INSTRUCTIONS (if applicable):  Keep a dry clean dressing on the anesthesia/puncture wound site if there is drainage.  Once the wound has quit draining you may leave it open to air.  Generally you should leave the bandage intact for twenty four hours unless there is drainage.  If the epidural site drains for more than 36-48 hours please call the anesthesia department.  QUESTIONS?:  Please feel free to call your physician or the hospital operator if you have any questions, and they will be happy to assist you.

## 2013-07-12 ENCOUNTER — Encounter (HOSPITAL_COMMUNITY): Payer: Self-pay

## 2013-07-12 ENCOUNTER — Encounter (HOSPITAL_COMMUNITY)
Admission: RE | Admit: 2013-07-12 | Discharge: 2013-07-12 | Disposition: A | Discharge status: Home / Self Care | Payer: BC Managed Care – PPO | Source: Ambulatory Visit | Attending: General Surgery | Admitting: General Surgery

## 2013-07-12 DIAGNOSIS — Z01812 Encounter for preprocedural laboratory examination: Secondary | ICD-10-CM | POA: Insufficient documentation

## 2013-07-12 HISTORY — DX: Anxiety disorder, unspecified: F41.9

## 2013-07-12 LAB — LIPID PANEL
HDL: 47 mg/dL (ref >39)
LDL Cholesterol: 130 mg/dL — ABNORMAL HIGH (ref 0–99)
Triglycerides: 134 mg/dL (ref <150)

## 2013-07-12 LAB — HEPATIC FUNCTION PANEL
ALT: 32 U/L (ref 0–35)
AST: 29 U/L (ref 0–37)
Albumin: 3.7 g/dL (ref 3.5–5.2)
Alkaline Phosphatase: 104 U/L (ref 39–117)
Bilirubin, Direct: <0.1 mg/dL (ref 0.0–0.3)

## 2013-07-16 ENCOUNTER — Encounter (HOSPITAL_COMMUNITY): Payer: Self-pay

## 2013-07-17 ENCOUNTER — Observation Stay (HOSPITAL_COMMUNITY)
Admission: RE | Admit: 2013-07-17 | Discharge: 2013-07-19 | Disposition: A | Discharge status: Home / Self Care | Payer: BC Managed Care – PPO | Source: Ambulatory Visit | Attending: General Surgery | Admitting: General Surgery

## 2013-07-17 ENCOUNTER — Inpatient Hospital Stay (HOSPITAL_COMMUNITY): Payer: BC Managed Care – PPO | Admitting: Anesthesiology

## 2013-07-17 ENCOUNTER — Encounter (HOSPITAL_COMMUNITY)
Admission: RE | Disposition: A | Discharge status: Home / Self Care | Payer: Self-pay | Source: Ambulatory Visit | Attending: General Surgery

## 2013-07-17 ENCOUNTER — Encounter (HOSPITAL_COMMUNITY): Payer: Self-pay | Admitting: *Deleted

## 2013-07-17 ENCOUNTER — Encounter (HOSPITAL_COMMUNITY): Payer: BC Managed Care – PPO | Admitting: Anesthesiology

## 2013-07-17 DIAGNOSIS — K219 Gastro-esophageal reflux disease without esophagitis: Secondary | ICD-10-CM

## 2013-07-17 DIAGNOSIS — Z01812 Encounter for preprocedural laboratory examination: Secondary | ICD-10-CM | POA: Insufficient documentation

## 2013-07-17 DIAGNOSIS — C50919 Malignant neoplasm of unspecified site of unspecified female breast: Principal | ICD-10-CM | POA: Insufficient documentation

## 2013-07-17 HISTORY — PX: MASTECTOMY MODIFIED RADICAL: SHX5962

## 2013-07-17 SURGERY — MASTECTOMY, MODIFIED RADICAL
Anesthesia: General | Site: Breast | Laterality: Left | Wound class: Clean

## 2013-07-17 MED ORDER — LIDOCAINE HCL (CARDIAC) 10 MG/ML IV SOLN
INTRAVENOUS | Status: DC | PRN
  Administered 2013-07-17: 20 mg via INTRAVENOUS

## 2013-07-17 MED ORDER — PANTOPRAZOLE SODIUM 40 MG PO TBEC
40.0000 mg | DELAYED_RELEASE_TABLET | Freq: Every day | ORAL | Status: DC
  Administered 2013-07-17 – 2013-07-19 (×3): 40 mg via ORAL
  Filled 2013-07-17 (×3): qty 1

## 2013-07-17 MED ORDER — CEFAZOLIN SODIUM 1-5 GM-% IV SOLN
1.0000 g | Freq: Once | INTRAVENOUS | Status: AC
  Administered 2013-07-17: 1 g via INTRAVENOUS
  Filled 2013-07-17: qty 50

## 2013-07-17 MED ORDER — EPHEDRINE SULFATE 50 MG/ML IJ SOLN
INTRAMUSCULAR | Status: DC | PRN
  Administered 2013-07-17: 10 mg via INTRAVENOUS
  Administered 2013-07-17 (×3): 5 mg via INTRAVENOUS

## 2013-07-17 MED ORDER — LOSARTAN POTASSIUM-HCTZ 100-25 MG PO TABS: 1.0000 | ORAL_TABLET | Freq: Every day | ORAL | Status: DC

## 2013-07-17 MED ORDER — ONDANSETRON HCL 4 MG/2ML IJ SOLN
4.0000 mg | Freq: Once | INTRAMUSCULAR | Status: AC
Start: 2013-07-17 — End: 2013-07-17
  Administered 2013-07-17: 4 mg via INTRAVENOUS

## 2013-07-17 MED ORDER — KETOROLAC TROMETHAMINE 30 MG/ML IJ SOLN
INTRAMUSCULAR | Status: AC
  Filled 2013-07-17: qty 1

## 2013-07-17 MED ORDER — ROCURONIUM BROMIDE 100 MG/10ML IV SOLN
INTRAVENOUS | Status: DC | PRN
  Administered 2013-07-17: 5 mg via INTRAVENOUS
  Administered 2013-07-17: 35 mg via INTRAVENOUS

## 2013-07-17 MED ORDER — TRAZODONE HCL 50 MG PO TABS
25.0000 mg | ORAL_TABLET | Freq: Every evening | ORAL | Status: DC | PRN
  Administered 2013-07-18: 25 mg via ORAL
  Filled 2013-07-17: qty 1

## 2013-07-17 MED ORDER — LIDOCAINE HCL (PF) 1 % IJ SOLN
INTRAMUSCULAR | Status: AC
  Filled 2013-07-17: qty 5

## 2013-07-17 MED ORDER — KETOROLAC TROMETHAMINE 30 MG/ML IJ SOLN
15.0000 mg | Freq: Four times a day (QID) | INTRAMUSCULAR | Status: DC
  Administered 2013-07-17 – 2013-07-19 (×8): 15 mg via INTRAVENOUS
  Filled 2013-07-17 (×8): qty 1

## 2013-07-17 MED ORDER — FENTANYL CITRATE 0.05 MG/ML IJ SOLN
INTRAMUSCULAR | Status: AC
  Filled 2013-07-17: qty 2

## 2013-07-17 MED ORDER — ONDANSETRON HCL 4 MG/2ML IJ SOLN: 4.0000 mg | Freq: Once | INTRAMUSCULAR | Status: DC | PRN

## 2013-07-17 MED ORDER — HYDROMORPHONE HCL PF 1 MG/ML IJ SOLN
1.0000 mg | INTRAMUSCULAR | Status: DC | PRN
  Administered 2013-07-17 (×2): 1 mg via INTRAVENOUS
  Filled 2013-07-17 (×2): qty 1

## 2013-07-17 MED ORDER — ONDANSETRON HCL 4 MG/2ML IJ SOLN
INTRAMUSCULAR | Status: AC
  Filled 2013-07-17: qty 2

## 2013-07-17 MED ORDER — ACETAMINOPHEN 325 MG PO TABS
650.0000 mg | ORAL_TABLET | Freq: Four times a day (QID) | ORAL | Status: DC | PRN
  Administered 2013-07-18: 650 mg via ORAL
  Filled 2013-07-17: qty 2

## 2013-07-17 MED ORDER — STERILE WATER FOR IRRIGATION IR SOLN
Status: DC | PRN
  Administered 2013-07-17 (×2): 1000 mL

## 2013-07-17 MED ORDER — LACTATED RINGERS IV SOLN
INTRAVENOUS | Status: DC
  Administered 2013-07-17: 08:00:00 via INTRAVENOUS
  Administered 2013-07-17: 1000 mL via INTRAVENOUS

## 2013-07-17 MED ORDER — HYDROCHLOROTHIAZIDE 25 MG PO TABS
25.0000 mg | ORAL_TABLET | Freq: Every day | ORAL | Status: DC
  Administered 2013-07-18 – 2013-07-19 (×2): 25 mg via ORAL
  Filled 2013-07-17 (×2): qty 1

## 2013-07-17 MED ORDER — STERILE WATER FOR INJECTION IJ SOLN: INTRAMUSCULAR | Status: DC | PRN

## 2013-07-17 MED ORDER — SODIUM CHLORIDE BACTERIOSTATIC 0.9 % IJ SOLN
INTRAMUSCULAR | Status: AC
  Filled 2013-07-17: qty 10

## 2013-07-17 MED ORDER — PROPOFOL 10 MG/ML IV BOLUS
INTRAVENOUS | Status: DC | PRN
  Administered 2013-07-17: 150 mg via INTRAVENOUS

## 2013-07-17 MED ORDER — STERILE WATER FOR IRRIGATION IR SOLN
Status: DC | PRN
  Administered 2013-07-17: 3000 mL

## 2013-07-17 MED ORDER — PROPOFOL 10 MG/ML IV BOLUS
INTRAVENOUS | Status: AC
  Filled 2013-07-17: qty 20

## 2013-07-17 MED ORDER — NEOSTIGMINE METHYLSULFATE 1 MG/ML IJ SOLN
INTRAMUSCULAR | Status: DC | PRN
  Administered 2013-07-17: 1 mg via INTRAVENOUS

## 2013-07-17 MED ORDER — EPHEDRINE SULFATE 50 MG/ML IJ SOLN
INTRAMUSCULAR | Status: AC
  Filled 2013-07-17: qty 1

## 2013-07-17 MED ORDER — PHENYLEPHRINE HCL 10 MG/ML IJ SOLN
INTRAMUSCULAR | Status: DC | PRN
  Administered 2013-07-17: 100 ug via INTRAVENOUS

## 2013-07-17 MED ORDER — LOSARTAN POTASSIUM 50 MG PO TABS
100.0000 mg | ORAL_TABLET | Freq: Every day | ORAL | Status: DC
  Administered 2013-07-18 – 2013-07-19 (×2): 100 mg via ORAL
  Filled 2013-07-17 (×2): qty 2

## 2013-07-17 MED ORDER — GLYCOPYRROLATE 0.2 MG/ML IJ SOLN
INTRAMUSCULAR | Status: DC | PRN
  Administered 2013-07-17: 0.2 mg via INTRAVENOUS

## 2013-07-17 MED ORDER — FENTANYL CITRATE 0.05 MG/ML IJ SOLN
INTRAMUSCULAR | Status: AC
  Filled 2013-07-17: qty 5

## 2013-07-17 MED ORDER — GLYCOPYRROLATE 0.2 MG/ML IJ SOLN
INTRAMUSCULAR | Status: AC
  Filled 2013-07-17: qty 2

## 2013-07-17 MED ORDER — CEFAZOLIN SODIUM-DEXTROSE 2-3 GM-% IV SOLR
2.0000 g | Freq: Once | INTRAVENOUS | Status: AC
  Administered 2013-07-17: 2 g via INTRAVENOUS
  Filled 2013-07-17: qty 50

## 2013-07-17 MED ORDER — DULOXETINE HCL 30 MG PO CPEP
30.0000 mg | ORAL_CAPSULE | Freq: Every day | ORAL | Status: DC
  Administered 2013-07-18 – 2013-07-19 (×2): 30 mg via ORAL
  Filled 2013-07-17 (×3): qty 1

## 2013-07-17 MED ORDER — MIDAZOLAM HCL 2 MG/2ML IJ SOLN
1.0000 mg | INTRAMUSCULAR | Status: DC | PRN
  Administered 2013-07-17 (×2): 2 mg via INTRAVENOUS

## 2013-07-17 MED ORDER — POTASSIUM CHLORIDE IN NACL 20-0.9 MEQ/L-% IV SOLN
INTRAVENOUS | Status: DC
  Administered 2013-07-17 – 2013-07-18 (×3): via INTRAVENOUS

## 2013-07-17 MED ORDER — PHENYLEPHRINE HCL 10 MG/ML IJ SOLN
INTRAMUSCULAR | Status: AC
  Filled 2013-07-17: qty 1

## 2013-07-17 MED ORDER — FENTANYL CITRATE 0.05 MG/ML IJ SOLN
25.0000 ug | INTRAMUSCULAR | Status: DC | PRN
  Administered 2013-07-17 (×4): 50 ug via INTRAVENOUS
  Filled 2013-07-17: qty 2

## 2013-07-17 MED ORDER — MIDAZOLAM HCL 2 MG/2ML IJ SOLN
INTRAMUSCULAR | Status: AC
  Filled 2013-07-17: qty 4

## 2013-07-17 MED ORDER — FENTANYL CITRATE 0.05 MG/ML IJ SOLN
INTRAMUSCULAR | Status: DC | PRN
  Administered 2013-07-17 (×3): 25 ug via INTRAVENOUS
  Administered 2013-07-17 (×3): 50 ug via INTRAVENOUS
  Administered 2013-07-17: 25 ug via INTRAVENOUS

## 2013-07-17 MED ORDER — ROCURONIUM BROMIDE 50 MG/5ML IV SOLN
INTRAVENOUS | Status: AC
  Filled 2013-07-17: qty 1

## 2013-07-17 MED ORDER — KETOROLAC TROMETHAMINE 30 MG/ML IJ SOLN
30.0000 mg | Freq: Once | INTRAMUSCULAR | Status: AC
  Administered 2013-07-17: 30 mg via INTRAVENOUS

## 2013-07-17 MED ORDER — ONDANSETRON HCL 4 MG/2ML IJ SOLN
4.0000 mg | Freq: Four times a day (QID) | INTRAMUSCULAR | Status: DC | PRN
  Administered 2013-07-17: 4 mg via INTRAVENOUS
  Filled 2013-07-17: qty 2

## 2013-07-17 SURGICAL SUPPLY — 52 items
APPLIER CLIP 11 MED OPEN (CLIP) ×2
APPLIER CLIP 9.375 SM OPEN (CLIP)
ATTRACTOMAT 16X20 MAGNETIC DRP (DRAPES) ×2 IMPLANT
BAG HAMPER (MISCELLANEOUS) ×2 IMPLANT
BLADE SURG SZ10 CARB STEEL (BLADE) ×2 IMPLANT
BNDG CONFORM 6X.82 1P STRL (GAUZE/BANDAGES/DRESSINGS) ×2 IMPLANT
CLEANER TIP ELECTROSURG 2X2 (MISCELLANEOUS) ×2 IMPLANT
CLIP APPLIE 11 MED OPEN (CLIP) ×1 IMPLANT
CLIP APPLIE 9.375 SM OPEN (CLIP) IMPLANT
CLOTH BEACON ORANGE TIMEOUT ST (SAFETY) ×2 IMPLANT
COVER LIGHT HANDLE STERIS (MISCELLANEOUS) ×4 IMPLANT
DRAPE PROXIMA HALF (DRAPES) ×2 IMPLANT
DRAPE UTILITY W/TAPE 26X15 (DRAPES) ×2 IMPLANT
DRAPE WARM FLUID 44X44 (DRAPE) ×2 IMPLANT
ELECT REM PT RETURN 9FT ADLT (ELECTROSURGICAL) ×2
ELECTRODE REM PT RTRN 9FT ADLT (ELECTROSURGICAL) ×1 IMPLANT
EVACUATOR DRAINAGE 10X20 100CC (DRAIN) ×2 IMPLANT
EVACUATOR SILICONE 100CC (DRAIN) ×2
GLOVE BIOGEL PI IND STRL 7.0 (GLOVE) ×2 IMPLANT
GLOVE BIOGEL PI IND STRL 7.5 (GLOVE) ×1 IMPLANT
GLOVE BIOGEL PI INDICATOR 7.0 (GLOVE) ×2
GLOVE BIOGEL PI INDICATOR 7.5 (GLOVE) ×1
GLOVE ECLIPSE 6.5 STRL STRAW (GLOVE) ×2 IMPLANT
GLOVE ECLIPSE 7.0 STRL STRAW (GLOVE) ×2 IMPLANT
GLOVE SKINSENSE NS SZ7.0 (GLOVE) ×2
GLOVE SKINSENSE STRL SZ7.0 (GLOVE) ×2 IMPLANT
GLOVE SS BIOGEL STRL SZ 6.5 (GLOVE) ×2 IMPLANT
GLOVE SUPERSENSE BIOGEL SZ 6.5 (GLOVE) ×2
GOWN STRL REIN XL XLG (GOWN DISPOSABLE) ×6 IMPLANT
INST SET MINOR GENERAL (KITS) ×2 IMPLANT
KIT ROOM TURNOVER APOR (KITS) ×2 IMPLANT
MANIFOLD NEPTUNE II (INSTRUMENTS) ×2 IMPLANT
MARKER SKIN DUAL TIP RULER LAB (MISCELLANEOUS) ×2 IMPLANT
PACK MINOR (CUSTOM PROCEDURE TRAY) ×2 IMPLANT
PAD ABD 5X9 TENDERSORB (GAUZE/BANDAGES/DRESSINGS) ×4 IMPLANT
PAD ARMBOARD 7.5X6 YLW CONV (MISCELLANEOUS) ×2 IMPLANT
SET BASIN LINEN APH (SET/KITS/TRAYS/PACK) ×2 IMPLANT
SOL PREP PROV IODINE SCRUB 4OZ (MISCELLANEOUS) ×2 IMPLANT
SPONGE DRAIN TRACH 4X4 STRL 2S (GAUZE/BANDAGES/DRESSINGS) ×2 IMPLANT
SPONGE GAUZE 4X4 12PLY (GAUZE/BANDAGES/DRESSINGS) ×2 IMPLANT
SPONGE INTESTINAL PEANUT (DISPOSABLE) ×4 IMPLANT
SPONGE LAP 18X18 X RAY DECT (DISPOSABLE) ×2 IMPLANT
STAPLER VISISTAT 35W (STAPLE) ×2 IMPLANT
STOCKINETTE IMPERVIOUS LG (DRAPES) ×2 IMPLANT
SUT ETHILON 3 0 FSL (SUTURE) ×2 IMPLANT
SUT SILK 2 0 (SUTURE) ×1
SUT SILK 2-0 18XBRD TIE 12 (SUTURE) ×1 IMPLANT
SUT VICRYL AB 2 0 TIES (SUTURE) ×2 IMPLANT
SYR BULB IRRIGATION 50ML (SYRINGE) ×2 IMPLANT
TAPE CLOTH SURG 4X10 WHT LF (GAUZE/BANDAGES/DRESSINGS) ×2 IMPLANT
TOWEL OR 17X26 4PK STRL BLUE (TOWEL DISPOSABLE) ×2 IMPLANT
WATER STERILE IRR 1000ML POUR (IV SOLUTION) ×10 IMPLANT

## 2013-07-17 NOTE — Transfer of Care (Signed)
Immediate Anesthesia Transfer of Care Note  Patient: Heather Vaughan  Procedure(s) Performed: Procedure(s) (LRB): MASTECTOMY MODIFIED RADICAL WITH LEFT AXILLARY TISSUE REMOVAL (Left)  Patient Location: PACU  Anesthesia Type: General  Level of Consciousness: awake  Airway & Oxygen Therapy: Patient Spontanous Breathing and non-rebreather face mask  Post-op Assessment: Report given to PACU RN, Post -op Vital signs reviewed and stable and Patient moving all extremities  Post vital signs: Reviewed and stable  Complications: No apparent anesthesia complications

## 2013-07-17 NOTE — Anesthesia Procedure Notes (Signed)
Procedure Name: Intubation Date/Time: 07/17/2013 7:47 AM Performed by: Franco Nones Pre-anesthesia Checklist: Patient identified, Patient being monitored, Timeout performed, Emergency Drugs available and Suction available Patient Re-evaluated:Patient Re-evaluated prior to inductionOxygen Delivery Method: Circle System Utilized Preoxygenation: Pre-oxygenation with 100% oxygen Intubation Type: IV induction Ventilation: Mask ventilation without difficulty Laryngoscope Size: Miller and 2 Grade View: Grade I Tube type: Oral Tube size: 7.0 mm Number of attempts: 1 Airway Equipment and Method: stylet Placement Confirmation: ETT inserted through vocal cords under direct vision,  positive ETCO2 and breath sounds checked- equal and bilateral Secured at: 21 cm Tube secured with: Tape Dental Injury: Teeth and Oropharynx as per pre-operative assessment

## 2013-07-17 NOTE — Anesthesia Postprocedure Evaluation (Signed)
  Anesthesia Post-op Note  Patient: Heather Vaughan  Procedure(s) Performed: Procedure(s): MASTECTOMY MODIFIED RADICAL WITH LEFT AXILLARY TISSUE REMOVAL (Left)  Patient Location: PACU  Anesthesia Type:General  Level of Consciousness: awake, sedated and patient cooperative  Airway and Oxygen Therapy: Patient Spontanous Breathing and non-rebreather face mask  Post-op Pain: mild  Post-op Assessment: Post-op Vital signs reviewed, Patient's Cardiovascular Status Stable, Respiratory Function Stable, Patent Airway and No signs of Nausea or vomiting  Post-op Vital Signs: Reviewed and stable  Complications: No apparent anesthesia complications

## 2013-07-17 NOTE — Addendum Note (Signed)
Addendum created 07/17/13 1123 by Franco Nones, CRNA   Modules edited: Anesthesia Events, Anesthesia Flowsheet

## 2013-07-17 NOTE — Progress Notes (Signed)
Post OP Check  Filed Vitals:   07/17/13 1121  BP: 106/57  Pulse: 113  Temp: 98.7 F (37.1 C)  Resp: 16   Dressing dry and in tact 75 cc serosanguinous drainage.  Incisional pain under control.  Scapula flat with minimal numbness under Left upper arm.  Doing well.

## 2013-07-17 NOTE — Anesthesia Preprocedure Evaluation (Addendum)
Anesthesia Evaluation  Patient identified by MRN, date of birth, ID band Patient awake    Reviewed: Allergy & Precautions, H&P , NPO status , Patient's Chart, lab work & pertinent test results  History of Anesthesia Complications Negative for: history of anesthetic complications  Airway Mallampati: I      Dental  (+) Partial Upper and Teeth Intact   Pulmonary former smoker,    Pulmonary exam normal       Cardiovascular hypertension, Pt. on medications Rhythm:Regular Rate:Normal     Neuro/Psych PSYCHIATRIC DISORDERS Depression    GI/Hepatic GERD-  Medicated and Controlled,  Endo/Other    Renal/GU      Musculoskeletal   Abdominal   Peds  Hematology   Anesthesia Other Findings   Reproductive/Obstetrics                           Anesthesia Physical Anesthesia Plan  ASA: II  Anesthesia Plan: General   Post-op Pain Management:    Induction: Intravenous, Rapid sequence and Cricoid pressure planned  Airway Management Planned: Oral ETT  Additional Equipment:   Intra-op Plan:   Post-operative Plan: Extubation in OR  Informed Consent: I have reviewed the patients History and Physical, chart, labs and discussed the procedure including the risks, benefits and alternatives for the proposed anesthesia with the patient or authorized representative who has indicated his/her understanding and acceptance.     Plan Discussed with:   Anesthesia Plan Comments:         Anesthesia Quick Evaluation

## 2013-07-17 NOTE — Brief Op Note (Signed)
07/17/2013  9:36 AM  PATIENT:  Heather Vaughan  57 y.o. female  PRE-OPERATIVE DIAGNOSIS:  invasive ductal carcinoma left breast  POST-OPERATIVE DIAGNOSIS:  invasive ductal carcinoma left breast  PROCEDURE:  Procedure(s): MASTECTOMY MODIFIED RADICAL WITH LEFT AXILLARY TISSUE REMOVAL (Left)  SURGEON:  Surgeon(s) and Role:    * Marlane Hatcher, MD - Primary  PHYSICIAN ASSISTANT:   ASSISTANTS: none   ANESTHESIA:   general  EBL:  Total I/O In: 1700 [I.V.:1700] Out: 25 [Blood:25]  BLOOD ADMINISTERED:none  DRAINS: Penrose drain in the 2 JP drains under sup breast flap and Left axilla.   LOCAL MEDICATIONS USED:  NONE  SPECIMEN:  Source of Specimen:  Left breast with axillary content.  DISPOSITION OF SPECIMEN:  PATHOLOGY  COUNTS:  YES  TOURNIQUET:  * No tourniquets in log *  DICTATION: .Other Dictation: Dictation Number OR dict. #   [id number].  PLAN OF CARE: Admit to inpatient   PATIENT DISPOSITION:  PACU - hemodynamically stable.   Delay start of Pharmacological VTE agent (>24hrs) due to surgical blood loss or risk of bleeding: not applicable

## 2013-07-17 NOTE — Progress Notes (Signed)
26 yr. Old W. Female for Left modified radical mastectomy for centrally located CA of the breast.  Procedures and options discussed in detail with pt and  family and informed consent obtained.  Labs reviewed and and surgical site marked.  No clinical changes in H&P.  Dict # [id number].  Filed Vitals:   07/17/13 0710  BP: 169/100  Pulse:   Resp: 11  pulse 87, temp 98.9, O2 sat 99% on RA.

## 2013-07-18 ENCOUNTER — Encounter (HOSPITAL_COMMUNITY): Payer: Self-pay | Admitting: General Surgery

## 2013-07-18 LAB — BASIC METABOLIC PANEL
Calcium: 8.7 mg/dL (ref 8.4–10.5)
GFR calc Af Amer: >90 mL/min (ref >90)
GFR calc non Af Amer: >90 mL/min (ref >90)
Glucose, Bld: 122 mg/dL — ABNORMAL HIGH (ref 70–99)
Potassium: 3.7 mEq/L (ref 3.5–5.1)
Sodium: 141 mEq/L (ref 135–145)

## 2013-07-18 LAB — CBC
HCT: 33.5 % — ABNORMAL LOW (ref 36.0–46.0)
Hemoglobin: 10.8 g/dL — ABNORMAL LOW (ref 12.0–15.0)
MCHC: 32.2 g/dL (ref 30.0–36.0)
Platelets: 190 10*3/uL (ref 150–400)
RDW: 12.4 % (ref 11.5–15.5)

## 2013-07-18 NOTE — Progress Notes (Signed)
Filed Vitals:   07/18/13 0427  BP: 150/87  Pulse: 87  Temp: 98.6 F (37 C)  Resp: 18    Pt feels much better and JP drainage is diminishing.  Dressings dry and in tact and pain is under control.  Will do first dressing change in AM.

## 2013-07-18 NOTE — Anesthesia Postprocedure Evaluation (Signed)
  Anesthesia Post-op Note  Patient: Heather Vaughan  Procedure(s) Performed: Procedure(s): MASTECTOMY MODIFIED RADICAL WITH LEFT AXILLARY TISSUE REMOVAL (Left)  Patient Location: Nursing Unit  Anesthesia Type:General  Level of Consciousness: awake, alert  and patient cooperative  Airway and Oxygen Therapy: Patient Spontanous Breathing  Post-op Pain: mild  Post-op Assessment: Post-op Vital signs reviewed, Patient's Cardiovascular Status Stable, Respiratory Function Stable, Patent Airway, No signs of Nausea or vomiting, Adequate PO intake and Pain level controlled  Post-op Vital Signs: Reviewed and stable  Complications: No apparent anesthesia complications

## 2013-07-18 NOTE — Op Note (Signed)
NAME:  Heather Vaughan, Heather Vaughan              ACCOUNT NO.:  [id number]  MEDICAL RECORD NO.:  [id number]  LOCATION:  [facility]                          FACILITY:  [facility]  PHYSICIAN:  Barbaraann Barthel, M.D. DATE OF BIRTH:  03/22/1956  DATE OF PROCEDURE:  07/17/2013 DATE OF DISCHARGE:                              OPERATIVE REPORT   DIAGNOSIS:  Invasive ductal carcinoma of the left breast.  This was associated with comedo necrosis and positive margins.  This was also a centrally located lesion.  PROCEDURE:  Left modified radical mastectomy.  WOUND CLASSIFICATION:  Clean.  SPECIMEN:  Left breast and axillary contents.  COMPLICATIONS:  None.  GROSS OPERATIVE FINDINGS:  The patient had a transverse incision which was completely around the entire previous biopsy site in order to ensure  better chance for clear margins.  There was some palpated lymphadenopathy which was removed.  I removed all palpable lymph nodes in the left axilla as well as the level 1 nodes that were at the tail of the of the breast.  TECHNIQUE:  The patient was placed in the supine position.  After the adequate administration of general anesthesia via endotracheal intubation, her entire abdomen was prepped with Betadine solution and draped in usual manner.  A limb isolating device was used on the left arm.  We then used a sterile marking pen, and marked the superior and inferior flaps of the breast We excised the breast tissue to the pectorals major Muscle inferiorly remove its fascia, and superiorly, as far as the clavicle, and medially, as far as the Sternum, and inferiorly to the rectus muscle, and laterally to the latissimus dorsi.  We removed the breast tissue including the fascia of the pectorals major muscle, we then removed the breast tissue along the tail of the breast, and there was a palpable lymph node in that area, that I removed as well as the lymph tissue working down from the axillary vein.  This tissue was also  sent to Pathology for permanent section and for ER/PR receptors, and for permanent section.  After the breast tissue was removed, we then irrigated the wound with sterile water. Everyone of the operating team changed their gloves, and we then placed a Jackson-Pratt drain in the left axilla, and another Jackson-Pratt drain below the superior flap of the breast.  These were placed  through a separate stab wound incision in the inferior flap.  The wound was then closed with a stapling device, and the drains were sutured in place with 3-0 nylon.  Prior to closure, all sponge, needle, and instrument counts found to be correct. Estimated blood loss was less than 50 mL.  She received about 800 mL of crystalloids intraoperatively.  There were no complications.     Barbaraann Barthel, M.D.     WB/MEDQ  D:  07/17/2013  T:  07/17/2013  Job:  [id number]  cc:   Oncology Department

## 2013-07-18 NOTE — Progress Notes (Signed)
POD # 1  Filed Vitals:   07/18/13 0427  BP: 150/87  Pulse: 87  Temp: 98.6 F (37 C)  Resp: 18    Wound clean and dry.   Has considerable serosanguinous drainage from axillary drain will not remove yet.  Pt is putting up a brave front but I think she will benefit from Reach for recovery. She has good range of motion with shoulder and pain is controled under present regimen.  Hopefully home in AM  Labs reviewed.  Doing well post op.

## 2013-07-18 NOTE — Progress Notes (Signed)
UR completed 

## 2013-07-19 LAB — CBC
HCT: 33.8 % — ABNORMAL LOW (ref 36.0–46.0)
Hemoglobin: 10.9 g/dL — ABNORMAL LOW (ref 12.0–15.0)
MCH: 32.8 pg (ref 26.0–34.0)
Platelets: 188 10*3/uL (ref 150–400)
RBC: 3.32 MIL/uL — ABNORMAL LOW (ref 3.87–5.11)
WBC: 6.3 10*3/uL (ref 4.0–10.5)

## 2013-07-19 LAB — BASIC METABOLIC PANEL
BUN: 12 mg/dL (ref 6–23)
CO2: 29 mEq/L (ref 19–32)
Glucose, Bld: 96 mg/dL (ref 70–99)
Potassium: 3.5 mEq/L (ref 3.5–5.1)
Sodium: 144 mEq/L (ref 135–145)

## 2013-07-19 MED ORDER — HYDROMORPHONE HCL PF 1 MG/ML IJ SOLN
0.5000 mg | Freq: Once | INTRAMUSCULAR | Status: AC
  Administered 2013-07-19: 0.5 mg via INTRAVENOUS
  Filled 2013-07-19: qty 1

## 2013-07-19 NOTE — Progress Notes (Signed)
POD # 2  Filed Vitals:   07/19/13 1029  BP: 136/73  Pulse: 91  Temp:   Resp:     Afebrile  Pt doing very post op.  Minimal JP drainage noted so I have removed both drains.  Wound redressed and it appears very clean.  She has minimal pain and good range of motion with Left shoulder.  She will meet with reach for recovery further as OP.  She has a positive attitude and we will be making follow up arrangements with oncology dept.  Discharge note #  [id number].

## 2013-07-19 NOTE — Progress Notes (Signed)
Pt discharged home today per Dr. Malvin Johns. VS stable at this time. Pt's IV site D/C'd and WNL. Pt's VS stable at this time. Pt provided with home medication list and discharge instructions. Verbalized understanding. Pt left floor via WC in stable condition accompanied by RN.

## 2013-07-22 NOTE — Discharge Summary (Signed)
NAME:  Heather Vaughan, Heather Vaughan                   ACCOUNT NO.:  MEDICAL RECORD NO.:  [id number]  LOCATION:                                 FACILITY:  PHYSICIAN:  Barbaraann Barthel, M.D. DATE OF BIRTH:  1955-11-28  DATE OF ADMISSION:  07/18/2013 DATE OF DISCHARGE:  11/22/2014LH                              DISCHARGE SUMMARY   DIAGNOSIS:  Infiltrating ductal carcinoma of the left breast.  PROCEDURE:  On July 18, 2013, left modified radical mastectomy.  NOTE:  This is a 57 year old white female with no past history of carcinoma of the breast in her family, who was found to have, on biopsy, an infiltrating ductal carcinoma of the comedo type which was centrally located.  We performed a needle localization and partial mastectomy on July 03, 2013, which confirmed this.  The margins were positive and this was a centrally located lesion with a smallish breast, and the patient opted and I agreed with the modified radical mastectomy as the approach to get local control of the disease prior to follow up with Oncology.  We discussed this in detail.  Other options were considered, and we admitted her via the outpatient department to proceed with a left modified radical mastectomy.  This was carried out as stated on July 18, 2013, this was uneventful.  Postoperatively, she did well.  Her wound was clean.  She had no postoperative bleeding or infection. Jackson-Pratt drains were removed on the second postoperative day after they continued to diminish in output and her output was serosanguineous in nature.  We may need to aspirate this in the office if this develops seromas, but this should not be a big problem.  This was discussed in detail with her.  At the time of discharge, her wound was very clean. She had good range of motion with minimal discomfort, and her scapula was flat.  There was no damage to the long thoracic nerve of Bell, and everything proceeded well with the expected surgical  discomfort. Mentally, the patient, I think, has a very positive attitude.  She will be reinforced with a good family and with reach for recovery, and we will follow up with her as well as with the Oncology Department.  I will follow her perioperatively.  Her postoperative labs were reviewed, they were within normal limits and acceptable.  There were no elevation of her liver function studies.  Her final pathology is still pending, and she was discharged on July 20, 2013.     Barbaraann Barthel, M.D.     WB/MEDQ  D:  07/19/2013  T:  07/20/2013  Job:  [id number]

## 2013-08-09 ENCOUNTER — Encounter (HOSPITAL_COMMUNITY): Payer: Self-pay

## 2013-08-09 ENCOUNTER — Encounter (HOSPITAL_COMMUNITY): Payer: BC Managed Care – PPO | Attending: Hematology and Oncology

## 2013-08-09 VITALS — BP 128/83 | HR 83 | Temp 97.7°F | Resp 18 | Ht 62.5 in | Wt 152.6 lb

## 2013-08-09 DIAGNOSIS — E559 Vitamin D deficiency, unspecified: Secondary | ICD-10-CM

## 2013-08-09 DIAGNOSIS — C50919 Malignant neoplasm of unspecified site of unspecified female breast: Secondary | ICD-10-CM | POA: Insufficient documentation

## 2013-08-09 DIAGNOSIS — C773 Secondary and unspecified malignant neoplasm of axilla and upper limb lymph nodes: Secondary | ICD-10-CM

## 2013-08-09 DIAGNOSIS — Z170 Estrogen receptor positive status [ER+]: Secondary | ICD-10-CM

## 2013-08-09 DIAGNOSIS — C50912 Malignant neoplasm of unspecified site of left female breast: Secondary | ICD-10-CM

## 2013-08-09 DIAGNOSIS — Z853 Personal history of malignant neoplasm of breast: Secondary | ICD-10-CM | POA: Insufficient documentation

## 2013-08-09 DIAGNOSIS — Z09 Encounter for follow-up examination after completed treatment for conditions other than malignant neoplasm: Secondary | ICD-10-CM | POA: Insufficient documentation

## 2013-08-09 DIAGNOSIS — K2270 Barrett's esophagus without dysplasia: Secondary | ICD-10-CM

## 2013-08-09 LAB — COMPREHENSIVE METABOLIC PANEL
ALT: 24 U/L (ref 0–35)
Albumin: 4 g/dL (ref 3.5–5.2)
Alkaline Phosphatase: 107 U/L (ref 39–117)
BUN: 17 mg/dL (ref 6–23)
CO2: 27 mEq/L (ref 19–32)
Chloride: 102 mEq/L (ref 96–112)
GFR calc Af Amer: >90 mL/min (ref >90)
GFR calc non Af Amer: >90 mL/min (ref >90)
Glucose, Bld: 99 mg/dL (ref 70–99)
Potassium: 3 mEq/L — ABNORMAL LOW (ref 3.5–5.1)
Sodium: 140 mEq/L (ref 135–145)
Total Bilirubin: 0.2 mg/dL — ABNORMAL LOW (ref 0.3–1.2)
Total Protein: 7.5 g/dL (ref 6.0–8.3)

## 2013-08-09 LAB — CBC WITH DIFFERENTIAL/PLATELET
Basophils Relative: 0 % (ref 0–1)
Hemoglobin: 12.1 g/dL (ref 12.0–15.0)
Lymphocytes Relative: 35 % (ref 12–46)
Lymphs Abs: 2.4 10*3/uL (ref 0.7–4.0)
MCV: 99.5 fL (ref 78.0–100.0)
Monocytes Absolute: 0.4 10*3/uL (ref 0.1–1.0)
Monocytes Relative: 6 % (ref 3–12)
Neutro Abs: 3.7 10*3/uL (ref 1.7–7.7)
Neutrophils Relative %: 55 % (ref 43–77)
Platelets: 241 10*3/uL (ref 150–400)
RBC: 3.72 MIL/uL — ABNORMAL LOW (ref 3.87–5.11)
WBC: 6.8 10*3/uL (ref 4.0–10.5)

## 2013-08-09 NOTE — Progress Notes (Signed)
[facility] Earl Lites A. Zigmund Daniel, M.D.  NEW PATIENT EVALUATION   Name: Heather Vaughan Date: 08/09/2013 MRN: [id number] DOB: 13-Apr-1956  PCP: Pershing Proud   REFERRING PHYSICIAN: Terie Purser, PA-C  REASON FOR REFERRAL: Newly diagnosed left breast cancer, status post left modified radical mastectomy with one node positive, tumor 1.4 cm in size, ER and PR positive, HER-2/neu overexpressed.     HISTORY OF PRESENT ILLNESS:Heather Vaughan is a 57 y.o. female who is referred for evaluation and recommendations regarding further management of left breast cancer. She tolerated surgery well with some residual left axillary seroma which is uncomfortable. She denies any fever, night sweats, abdominal pain, nausea, vomiting, bone pain, cough, wheezing, skin rash, headache, or seizures. She works full-time in a Data processing manager. She is married for 25 years her second husband and has a shared family with him. She did get yearly mammograms and this year showed an abnormality resulting in biopsy followed by lumpectomy and sentinel biopsy followed by left modified radical mastectomy because of positive margins multiple he, mostly noninvasive cancer. Her primary tumor was 1.4 cm in size, ER/PR positive, HER-2/neu positive.   PAST MEDICAL HISTORY:  has a past medical history of GERD (gastroesophageal reflux disease); Hypertension; Bronchitis (07/27/2011); Barrett's esophagus; and Anxiety.     PAST SURGICAL HISTORY: Past Surgical History  Procedure Laterality Date  . Excision morton's neuroma  07/14/2011    Procedure: EXCISION MORTON'S NEUROMA;  Surgeon: Dallas Schimke;  Location: [facility];  Service: Orthopedics;  Laterality: Right;  Removal Neuroma 3rd Interspace Right Foot  . Esophagogastroduodenoscopy  08/03/2011    Procedure: ESOPHAGOGASTRODUODENOSCOPY (EGD);  Surgeon: Malissa Hippo, MD;  Location: [facility];  Service:  Endoscopy;  Laterality: N/A;  12:45  . Partial mastectomy with needle localization Left 07/03/2013    Procedure: PARTIAL MASTECTOMY STATUS POST NEEDLE LOCALIZATION;  Surgeon: Marlane Hatcher, MD;  Location: [facility];  Service: General;  Laterality: Left;  . Breast biopsy Bilateral     X 4- benign  . Mastectomy modified radical Left 07/17/2013    Procedure: MASTECTOMY MODIFIED RADICAL WITH LEFT AXILLARY TISSUE REMOVAL;  Surgeon: Marlane Hatcher, MD;  Location: [facility];  Service: General;  Laterality: Left;     CURRENT MEDICATIONS: has a current medication list which includes the following prescription(s): acetaminophen, duloxetine, losartan-hydrochlorothiazide, nexium, and trazodone.   ALLERGIES: Review of patient's allergies indicates no known allergies.   SOCIAL HISTORY:  reports that she quit smoking about 19 years ago. Her smoking use included Cigarettes. She has a 20 pack-year smoking history. She does not have any smokeless tobacco history on file. She reports that she does not drink alcohol or use illicit drugs.   FAMILY HISTORY: family history is negative for Anesthesia problems, Hypotension, Malignant hyperthermia, and Pseudochol deficiency.    REVIEW OF SYSTEMS:  Other than that discussed above is noncontributory.    PHYSICAL EXAM:  height is 5' 2.5" (1.588 m) and weight is 152 lb 9.6 oz (69.219 kg). Her oral temperature is 97.7 F (36.5 C). Her blood pressure is 128/83 and her pulse is 83. Her respiration is 18.    GENERAL:alert, no distress and comfortable SKIN: skin color, texture, turgor are normal, no rashes or significant lesions EYES: normal, Conjunctiva are pink and non-injected, sclera clear OROPHARYNX:no exudate, no erythema and lips, buccal mucosa, and tongue normal  NECK: supple, thyroid normal size, non-tender, without nodularity CHEST: Status post  left mastectomy with no subcutaneous nodules. Small left seroma is present which is tender. LYMPH:  no  palpable lymphadenopathy in the cervical, axillary or inguinal LUNGS: clear to auscultation and percussion with normal breathing effort HEART: regular rate & rhythm and no murmurs ABDOMEN:abdomen soft, non-tender and normal bowel sounds MUSCULOSKELETALl:no cyanosis of digits, no clubbing or edema . No lymphedema. NEURO: alert & oriented x 3 with fluent speech, no focal motor/sensory deficits    LABORATORY DATA:  Admission on 07/17/2013, Discharged on 07/19/2013  Component Date Value Range Status  . WBC 07/18/2013 7.8  4.0 - 10.5 K/uL Final  . RBC 07/18/2013 3.28* 3.87 - 5.11 MIL/uL Final  . Hemoglobin 07/18/2013 10.8* 12.0 - 15.0 g/dL Final  . HCT 16/05/9603 33.5* 36.0 - 46.0 % Final  . MCV 07/18/2013 102.1* 78.0 - 100.0 fL Final  . MCH 07/18/2013 32.9  26.0 - 34.0 pg Final  . MCHC 07/18/2013 32.2  30.0 - 36.0 g/dL Final  . RDW 54/04/8118 12.4  11.5 - 15.5 % Final  . Platelets 07/18/2013 190  150 - 400 K/uL Final  . Sodium 07/18/2013 141  135 - 145 mEq/L Final  . Potassium 07/18/2013 3.7  3.5 - 5.1 mEq/L Final  . Chloride 07/18/2013 106  96 - 112 mEq/L Final  . CO2 07/18/2013 28  19 - 32 mEq/L Final  . Glucose, Bld 07/18/2013 122* 70 - 99 mg/dL Final  . BUN 14/78/2956 10  6 - 23 mg/dL Final  . Creatinine, Ser 07/18/2013 0.66  0.50 - 1.10 mg/dL Final  . Calcium 21/30/8657 8.7  8.4 - 10.5 mg/dL Final  . GFR calc non Af Amer 07/18/2013 >90  >90 mL/min Final  . GFR calc Af Amer 07/18/2013 >90  >90 mL/min Final   Comment: (NOTE)                          The eGFR has been calculated using the CKD EPI equation.                          This calculation has not been validated in all clinical situations.                          eGFR's persistently <90 mL/min signify possible Chronic Kidney                          Disease.  . WBC 07/19/2013 6.3  4.0 - 10.5 K/uL Final  . RBC 07/19/2013 3.32* 3.87 - 5.11 MIL/uL Final  . Hemoglobin 07/19/2013 10.9* 12.0 - 15.0 g/dL Final  . HCT 84/69/6295  33.8* 36.0 - 46.0 % Final  . MCV 07/19/2013 101.8* 78.0 - 100.0 fL Final  . MCH 07/19/2013 32.8  26.0 - 34.0 pg Final  . MCHC 07/19/2013 32.2  30.0 - 36.0 g/dL Final  . RDW 28/41/3244 12.2  11.5 - 15.5 % Final  . Platelets 07/19/2013 188  150 - 400 K/uL Final  . Sodium 07/19/2013 144  135 - 145 mEq/L Final  . Potassium 07/19/2013 3.5  3.5 - 5.1 mEq/L Final  . Chloride 07/19/2013 107  96 - 112 mEq/L Final  . CO2 07/19/2013 29  19 - 32 mEq/L Final  . Glucose, Bld 07/19/2013 96  70 - 99 mg/dL Final  . BUN 08/31/7251 12  6 - 23 mg/dL Final  .  Creatinine, Ser 07/19/2013 0.72  0.50 - 1.10 mg/dL Final  . Calcium 16/05/9603 9.0  8.4 - 10.5 mg/dL Final  . GFR calc non Af Amer 07/19/2013 >90  >90 mL/min Final  . GFR calc Af Amer 07/19/2013 >90  >90 mL/min Final   Comment: (NOTE)                          The eGFR has been calculated using the CKD EPI equation.                          This calculation has not been validated in all clinical situations.                          eGFR's persistently <90 mL/min signify possible Chronic Kidney                          Disease.  Hospital Outpatient Visit on 07/12/2013  Component Date Value Range Status  . Cholesterol 07/12/2013 204* 0 - 200 mg/dL Final  . Triglycerides 07/12/2013 134  <150 mg/dL Final  . HDL 54/04/8118 47  >39 mg/dL Final  . Total CHOL/HDL Ratio 07/12/2013 4.3   Final  . VLDL 07/12/2013 27  0 - 40 mg/dL Final  . LDL Cholesterol 07/12/2013 130* 0 - 99 mg/dL Final   Comment:                                 Total Cholesterol/HDL:CHD Risk                          Coronary Heart Disease Risk Table                                              Men   Women                           1/2 Average Risk   3.4   3.3                           Average Risk       5.0   4.4                           2 X Average Risk   9.6   7.1                           3 X Average Risk  23.4   11.0                                                          Use the  calculated Patient Ratio  above and the CHD Risk Table                          to determine the patient's CHD Risk.                                                          ATP III CLASSIFICATION (LDL):                           <100     mg/dL   Optimal                           100-129  mg/dL   Near or Above                                             Optimal                           130-159  mg/dL   Borderline                           160-189  mg/dL   High                           >190     mg/dL   Very High  . Total Protein 07/12/2013 6.7  6.0 - 8.3 g/dL Final  . Albumin 40/98/1191 3.7  3.5 - 5.2 g/dL Final  . AST 47/82/9562 29  0 - 37 U/L Final  . ALT 07/12/2013 32  0 - 35 U/L Final  . Alkaline Phosphatase 07/12/2013 104  39 - 117 U/L Final  . Total Bilirubin 07/12/2013 0.3  0.3 - 1.2 mg/dL Final  . Bilirubin, Direct 07/12/2013 <0.1  0.0 - 0.3 mg/dL Final  . Indirect Bilirubin 07/12/2013 NOT CALCULATED  0.3 - 0.9 mg/dL Final    Urinalysis No results found for this basename: colorurine,  appearanceur,  labspec,  phurine,  glucoseu,  hgbur,  bilirubinur,  ketonesur,  proteinur,  urobilinogen,  nitrite,  leukocytesur      @RADIOGRAPHY :    MM Digital Diagnostic Unilat L9 07/03/2013  Status: Final result            Study Result    CLINICAL DATA: Suspicious mass and calcifications in the left  breast. The patient presents for wire localization prior to  excisional biopsy.  EXAM:  ULTRASOUND NEEDLE LOC*L*R/S; IR ULTRASOUND GUIDED  ASPIRATION/DRAINAGE; DIGITAL DIAGNOSTIC UNILATERAL LEFT MAMMOGRAM  COMPARISON: Previous exams.  FINDINGS:  Patient presents for needle localization prior to excisional biopsy.  I met with the patient and we discussed the procedure of needle  localization including benefits and alternatives. We discussed the  high likelihood of a successful procedure. We discussed the risks of  the procedure, including infection,  bleeding, tissue injury, and  further surgery. Informed, written consent was given.  The usual time-out protocol was performed immediately prior to the  procedure.  Using ultrasound guidance, sterile technique, 2%  lidocaine and a 9  cm modified Kopans needle, a hypoechoic, irregular mass at 2 o'clock  position 1 cm from the nipple of the left breast was localized using  a medial to lateral approach. The films are marked for Dr. Malvin Johns.  Specimen radiograph is performed at [facility] and confirms  the mass, suspicious microcalcifications, and intact wire present in  the tissue sample. The specimen is marked for pathology.  IMPRESSION:  Needle localization left breast. No apparent complications.  Electronically Signed  By: Britta Mccreedy M.D.  On: 07/03/2013 13        MM Digital Diagnostic Unilat L 06/19/2013  Status: Final result            Study Result    CLINICAL DATA: Screening recall for a possible left breast mass.  EXAM:  ULTRASOUND LEFT BREAST; DIGITAL DIAGNOSTIC UNILATERAL LEFT MAMMOGRAM  COMPARISON: Previous exams.  FINDINGS:  There is a spiculated mass in the central to slightly superior left  breast with associated microcalcifications measuring approximately  1.6 cm, confirmed on the additional CC and MLO spot compression  views.  PHYSICAL EXAMINATION of the left breast at 12 o'clock 1 cm from the  nipple reveals a firm palpable lump.  Targeted ultrasound of the left breast was performed demonstrating  an irregular hypoechoic mass at 12 o'clock 1 cm from the nipple  measuring 1 x 1 x 1.2 cm.  No lymphadenopathy is seen in the left axilla.  IMPRESSION:  Highly suspicious left breast mass.  RECOMMENDATION:  Ultrasound-guided biopsy of the highly suspicious mass in the left  breast is recommended. The patient defers biopsy today, and would  like to discuss the findings as well as have her breat surgeon, Dr.  Ulice Bold, instead biopsy this highly  suspicious mass.  I have discussed the findings and recommendations with the patient.  Results were also provided in writing at the conclusion of the  visit. If applicable, a reminder letter will be sent to the patient  regarding the next appointment.  BI-RADS CATEGORY 5: Highly suggestive of malignancy - appropriate  action should be taken.  Electronically Signed  By: Edwin Cap M.D.  On: 06/19/2013 11:06       MM Digital Screening 05/27/2013  Status: Final result         PACS Images    Show images for MM Digital Screening         Study Result    CLINICAL DATA: Screening.  EXAM:  DIGITAL SCREENING BILATERAL MAMMOGRAM WITH CAD  COMPARISON: Previous Exam(s)  ACR Breast Density Category c: The breasts are heterogeneously  dense, which may obscure small masses.  FINDINGS:  In the left breast, calcifications and possible distortion warrant  further evaluation with spot compression magnification views and  possibly ultrasound. In the right breast, no suspicious masses or  malignant type calcifications are identified. Images were processed  with CAD.  IMPRESSION:  Further evaluation is suggested for calcifications and possible  distortion in the left breast.  RECOMMENDATION:  Diagnostic mammogram and possibly ultrasound of the left breast.  (Code:FI-L-50M)  The patient will be contacted regarding the findings, and additional  imaging will be scheduled.  BI-RADS CATEGORY 0: Incomplete. Need additional imaging evaluation  and/or prior mammograms for comparison.      CT Abdomen Pelvis W Contrast Status: Final result         PACS Images    Show images for CT Abdomen Pelvis W Contrast  Study Result    CLINICAL DATA: Breast carcinoma, left.  EXAM:  CT CHEST, ABDOMEN, AND PELVIS WITH CONTRAST  TECHNIQUE:  Multidetector CT imaging of the chest, abdomen and pelvis was  performed following the standard protocol during bolus  administration of  intravenous contrast.  CONTRAST: OMNIPAQUE IOHEXOL 300 MG/ML SOLN  COMPARISON: None.  FINDINGS:  CT CHEST FINDINGS  16 mm soft tissue density central in the left breast. No pleural or  pericardial effusion. Subcentimeter bilateral hilar lymph nodes.  Subcentimeter AP window, pretracheal, and right peritracheal lymph  nodes. Several small subpleural nodules in the right lower lobe,  largest 6 mm (images 35 and 39/3). There is a 4 mm subpleural  noncalcified nodule in the posterior left lower lobe image 37. Marland Kitchen  Left axillary lymph nodes up to 10 mm short axis diameter. Small  biapical subpleural blebs.).  CT ABDOMEN AND PELVIS FINDINGS  Unremarkable liver. Calcified stones in contracted gallbladder.  Unremarkable spleen, adrenal glands, pancreas, left kidney. Right  renal cysts, largest from the lower pole 2.2 cm. No hydronephrosis.  Patchy aortoiliac arterial calcifications without aneurysm. Small  hiatal hernia. Small bowel and colon are nondilated. Urinary bladder  incompletely distended. Uterus and adnexal regions unremarkable. No  ascites. No free air. No adenopathy. Portal vein patent. There is a  mild lumbar levoscoliosis apex L2-3. Degenerative disc disease L1-2.  Tarlov cysts in the pelvis. No worrisome lytic or sclerotic bone  lesions.  IMPRESSION:  1. Borderline enlarged left axillary lymph node.  2. Small noncalcified pulmonary nodules in the right lower and left  lower lobes as above. Too small for biopsy or PET-CT. Surveillance  recommended.  3. Cholelithiasis.  Electronically Signed  By: Oley Balm M.D.  On: 06/25/2013 23:     PATHOLOGY:  for SHADIAMOND, KOSKA ([id number]) Patient: SHEKETA, ENDE C Collected: 07/03/2013 Client: [facility] Accession: [id number] Received: 07/03/2013 Barbaraann Barthel DOB: 1956-02-21 Age: 66 Gender: F Reported: 07/05/2013 [address] Patient Ph: [phone] MRN #: [id number] [place] [address] Visit #:  [id number] Chart #: Phone: [phone] Fax: CC: REPORT OF SURGICAL PATHOLOGY ADDITIONAL INFORMATION: A sample (block 1D) was sent to [facility] for Oncotype testing. The patient's recurrence score is 29. Those patients who had a recurrence score of 29 had an average rate of distant recurrence of 17%. (JBK:ecj 08/09/2013) Pecola Leisure MD Pathologist, Electronic Signature ( Signed 08/09/2013) CHROMOGENIC IN-SITU HYBRIDIZATION Results: HER-2/NEU BY CISH - NO AMPLIFICATION OF HER-2 DETECTED. RESULT RATIO OF HER2: CEP 17 SIGNALS 1.40 AVERAGE HER2 COPY NUMBER PER CELL 2.80 REFERENCE RANGE NEGATIVE HER2/Chr17 Ratio <2.0 and Average HER2 copy number <4.0 EQUIVOCAL HER2/Chr17 Ratio <2.0 and Average HER2 copy number 4.0 and <6.0 POSITIVE HER2/Chr17 Ratio >=2.0 and/or Average HER2 copy number >=6.0 Jimmy Picket MD Pathologist, Electronic Signature ( Signed 07/15/2013) PROGNOSTIC INDICATORS - ACIS Results: IMMUNOHISTOCHEMICAL AND MORPHOMETRIC ANALYSIS BY THE AUTOMATED CELLULAR IMAGING SYSTEM (ACIS) 1 of 3 FINAL for Soffer, Jaryiah C ([id number]) ADDITIONAL INFORMATION:(continued) Estrogen Receptor: 74%, POSITIVE, MODERATE STAINING INTENSITY Progesterone Receptor: 16%, POSITIVE, WEAK STAINING INTENSITY Proliferation Marker Ki67: 52% REFERENCE RANGE ESTROGEN RECEPTOR NEGATIVE <1% POSITIVE =>1% PROGESTERONE RECEPTOR NEGATIVE <1% POSITIVE =>1% All controls stained appropriately Abigail Miyamoto MD Pathologist, Electronic Signature ( Signed 07/10/2013) FINAL DIAGNOSIS Diagnosis Breast, partial mastectomy, left - INVASIVE DUCTAL CARCINOMA, 1.4 CM, BROADLY INVOLVING THE INKED MARGIN AT MULTIPLE FOCI. - EXTENSIVE HIGH GRADE DUCTAL CARCINOMA IN SITU WITH ASSOCIATED COMEDO NECROSIS AND CALCIFICATION, EXTENDING TO THE INKED MARGIN AT MULTIPLE FOCI. - PLEASE  SEE ONCOLOGY TEMPLATE FOR DETAILING. Microscopic Comment BREAST, INVASIVE TUMOR, WITHOUT LYMPH NODE SAMPLING Specimen: Breast,  left. Procedure: Left partial mastectomy. Histologic type: Invasive ductal carcinoma. Grade: II Tubule formation: 3 Nuclear pleomorphism: 2 Mitotic: 2 Tumor size (gross measurement): 1.4 cm Margins: Positive for both in situ and invasive carcinoma at multiple foci, broadly involved. Lymphovascular invasion: Not identified. Ductal carcinoma in situ: Present. Grade: High grade. Extensive intraductal component: Present. Lobular neoplasia: No. Tumor focality: Unifocal. Treatment effect: No. Extent of tumor: Skin: N/A Nipple: N/A 2 of 3 FINAL for Yellin, Shreya C ([id number]) Microscopic Comment(continued) Skeletal muscle: N/A Breast prognostic profile: Will be performed and addendum report will follow. Non-neoplastic breast: Fibrocystic change. TNM: pT1c, pNX (HCL:ecj 07/05/2013) Abigail Miyamoto MD Pathologist, Electronic Signature (Case signed 07/05/2013) Specimen Gross and Clinical Information Specimen(s) Obtained: Breast, partial mastectomy, left Specimen Clinical Information abnormal mammogram Gross Specimen type: Received in formalin, and the cut surfaces exposed at 3:15 pm on 07/03/2013, and labeled left partial mastectomy. Size: 8.3 x 5.0 x 1.7 cm. Orientation: The specimen is unoriented, and entirely inked black. Localized area: There is a guiding wire, and no pins inserted in this specimen. Cut surface: Yellow lobulated adipose tissue with tan-white fibrous tissue (approximately 40% fibrous). There is a 1.4 x 1.4 x 1.3 cm tan-white, firm, ill-defined lesion. Margins: The specimen is inked as previously stated. The lesion abuts the two closest inked surfaces. Prognostic indicators: Obtain from paraffin blocks if needed. Block summary: Eight blocks submitted, with possible lesion submitted in cassettes B through G. (KL:ecj 07/04/2013) Stain(s) used in Diagnosis: The following stain(s) were used in diagnosing the case: ER-ACIS, PR-ACIS, CISH, KI-67-ACIS.  The control(s) stained appropriately. Disclaimer PR progesterone receptor (PgR 636), immunohistochemical stains are performed on formalin fixed, paraffin embedded tissue using a 3,3"-diaminobenzidine (DAB) chromogen and DAKO Autostainer System. The staining intensity of the nucleus is scored morphometrically using the Automated Cellular Imaging System (ACIS) and is reported as the percentage of tumor cell nuclei demonstrating specific nuclear staining. Estrogen receptor (SP1), immunohistochemical stains are performed on formalin fixed, paraffin embedded tissue using a 3,3"-diaminobenzidine (DAB) chromogen and DAKO Autostainer System. The staining intensity of the nucleus is scored morphometrically using the Automated Cellular Imaging System (ACIS) and is reported as the percentage of tumor cell nuclei demonstrating specific nuclear staining. Ki-67 (Mib-1), immunohistochemical stains are performed on formalin fixed, paraffin embedded tissue using a 3,3"-diaminobenzidine (DAB) chromogen and DAKO Autostainer System. The staining intensity of the nucleus is scored morphometrically using the Automated Cellular Imaging System (ACIS) and is reported as the percentage of tumor cell nuclei demonstrating specific nuclear staining. Her2Neu by CISH (chromogenic in-situ hybridization) is performed at [facility] Pathology, using the Her2CISH pharmDx Kit (code number [id number]) Report signed out from the following location(s) Technical Component performed at [facility] [address],[place] [address].CLIA:[id number],CAP:[id number]., Technical Component performed at [facility] [address], [place], [place] [address]. CLIA #: [id number], Interpretation performed at [facility] [address], [place], [place] [address]. CLIA #:  [id number],  -------------------------------------------------------------------------------------------------------------------------  for JAMARRIA, REAL ([id number]) Patient: ANUSHKA, HARTINGER Collected: 07/17/2013 Client: [facility] Accession: [id number] Received: 07/17/2013 Barbaraann Barthel DOB: Jun 18, 1956 Age: 37 Gender: F Reported: 07/19/2013 [address] Patient Ph: [phone] MRN #: [id number] [place] [address] Visit #: [id number] Chart #: Phone: [phone] Fax: CC: REPORT OF SURGICAL PATHOLOGY ADDITIONAL INFORMATION: CHROMOGENIC IN-SITU HYBRIDIZATION (performed on lymph node) Results: 07/17/2013 HER2/NEU BY CISH - SHOWS AMPLIFICATION BY CISH ANALYSIS. RESULT RATIO OF  HER2: CEP 17 SIGNALS 2.0 AVERAGE HER2 COPY NUMBER PER CELL 3.00 REFERENCE RANGE NEGATIVE HER2/Chr17 Ratio <2.0 and Average HER2 copy number <4.0 EQUIVOCAL HER2/Chr17 Ratio <2.0 and Average HER2 copy number 4.0 and <6.0 POSITIVE HER2/Chr17 Ratio >=2.0 and/or Average HER2 copy number >=6.0 OF NOTE, THE TUMOR APPEARS TO SHOW HETEROGENEITY IN REGARDS TO HER2 EXPRESSION. Pecola Leisure MD Pathologist, Electronic Signature ( Signed 07/24/2013) FINAL DIAGNOSIS Diagnosis Breast, modified radical mastectomy , left, with axillary contents - NO RESIDUAL CARCINOMA. - PREVIOUS EXCISIONAL SITE CHANGES. - BACKGROUND FIBROCYSTIC CHANGES. - INKED MARGINS, NEGATIVE FOR ATYPIA OR MALIGNANCY. - ONE OF TWELVE AXILLARY LYMPH NODES, POSITIVE FOR METASTATIC CARCINOMA WITH FOCAL EXTRANODAL EXTENSION(1/12). - ALL FINAL RESECTION MARGINS, NEGATIVE FOR ATYPIA OR MALIGNANCY. - PLEASE SEE ONCOLOGY TEMPLATE FOR DETAIL. 1 of 3 Duplicate copy FINAL for Kopke, Aithana C ([id number]) Microscopic Comment BREAST, INVASIVE TUMOR, WITH LYMPH NODE SAMPLING (PLEASE CORRELATE WITH PREVIOUS CASE [id number] FOR DETAILS) Specimen, including laterality and lymph node sampling (sentinel, non-sentinel): Left breast Procedure:  Modified radical mastectomy with left axillary lymph node dissection Histologic type: Invasive ductal carcinoma (please correlate with previous case [phone]) Grade: II Tubule formation: 3 Nuclear pleomorphism: 2 Mitotic:2 Tumor size (gross measurement or glass slide measurement): 1.4 cm (please correlate with previous case [id number] for detail) Margins: Final resection margins, negative for atypia or malignancy. Lymphovascular invasion: Not identified Ductal carcinoma in situ: Present Grade: High grade Extensive intraductal component: Present Lobular neoplasia: No Tumor focality: Unifocal Treatment effect: No Extent of tumor: Skin: No Nipple: No Skeletal muscle: N/A Lymph nodes: Examined: N/A Sentinel 12 Non-sentinel 12 Total Lymph nodes with metastasis: 1 Isolated tumor cells (< 0.2 mm): 0 Micrometastasis: (> 0.2 mm and < 2.0 mm): 0 Macrometastasis: (> 2.0 mm): 1 Extracapsular extension: 1 Breast prognostic profile: Please correlate with previous case for detail Estrogen receptor: 74%, positive, moderate staining intensity Progesterone receptor: 16%, positive, weak staining intensity Her 2 neu: No amplification of Her 2 neu detected, ratio of Her 2 neu: cep17: 1.4; average HER-2 copy numbers per cell: 2.8 Ki-67: 52% Her 2 neu will be repeated on the axillary lymph node and an addendum report will follow Non-neoplastic breast: Fibrocystic changes TNM: pT1c, pN1a, pMX (HCL:caf 07/19/13) Gwendolyn Grant LI MD Pathologist, Electronic Signature (Case signed 07/19/2013) Specimen Gross and Clinical Information Specimen(s) Obtained: Breast, modified radical mastectomy , left, with axillary contents 2 of 3 Duplicate copy FINAL for Meetze, Bethanne C ([id number]) Specimen Clinical Information invasive ductal carcinoma left breast Gross Specimen: Left modified radical mastectomy, including axillary contents. Specimen integrity (intact/disrupted): Portion of axillary contents  are separate from main specimen. Weight: 512 grams. Size: 20 x 13 x 6 cm. Skin: There is an 18 x 8 cm tan white ellipse with a central 4 cm in diameter nipple and surrounding areola, with a possible faint healed scar adjacent to the areola. Tumor/cavity: Within the central specimen is a 5.3 x 3.6 x 1.4 cm cavity which contains dark red soft material, and is surrounded by tan yellow to red indurated tissue. Residual lesion or mass is not identified. The indurated tissue surrounding the cavity comes within 1.7 cm of the deep margin. Uninvolved parenchyma: The remaining specimen consists predominantly of soft fatty tissue with minimal gray white soft fibrous tissue. Prognostic indicators: N/A Lymph nodes: The entire portion of axillary contents is 6 x 4 x 3 cm of aggregate and within which are found twelve possible lymph nodes ranging from 0.4 to 2.6 cm. 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 The control(s) stained appropriately. Disclaimer Her2Neu by CISH (chromogenic in-situ hybridization) is performed at [facility], using the Her2CISH pharmDx Kit (code number [phone]) Report signed out from the following location(s) Technical Component performed at [facility] [address],[place], [place] [address] CLIA: [id number]., Technical Component performed at [facility]. [address],[place] [address].CLIA:[id number],CAP:[id number]., Interpretation performed at [facility] [address]  IMPRESSION:  #1. Stage II (T1 c. N1 M0) duct cell carcinoma, ER/PR  positive, HER-2/neu overexpressed as determined in the left singular lymph node, status post left modified radical mastectomy. #2. Barrett's esophagus, on treatment. #3. Gastroesophageal reflux disease. #4. Hypertension, controlled.   PLAN:  #1. Discussion was held with the patient and her husband who accompanied her today. She manifests triple positive breast cancer and therefore worse recommended to take chemotherapy with docetaxel, carboplatin, and Herceptin every 3 weeks for 6 cycles with Neulasta support followed by Herceptin intravenously every 3 weeks for 12 treatments in conjunction with oral anastrozole. #2. She will undergo MUGA scan. #3. Refer back to Dr. Malvin Johns for life port insertion. #4. Tentatively to start treatment on 08/25/2013 with visit that same day. She was admitted today by nurse navigator and we'll go in more detail regarding the drugs themselves. Information about the 3 agents was given to the patient today. #5. Baseline tumor markers were done today.  I appreciate the opportunity of sharing in her care.   Maurilio Lovely, MD 08/09/2013 3:50 PM

## 2013-08-09 NOTE — Patient Instructions (Addendum)
[facility] Discharge Instructions  RECOMMENDATIONS MADE BY THE CONSULTANT AND ANY TEST RESULTS WILL BE SENT TO YOUR REFERRING PHYSICIAN.  EXAM FINDINGS BY THE PHYSICIAN TODAY AND SIGNS OR SYMPTOMS TO REPORT TO CLINIC OR PRIMARY PHYSICIAN:  INSTRUCTIONS GIVEN AND DISCUSSED: 1.  You will be contacted for port-a-cath placement.  2.  You will also be contacted for a MUGA scan to evaluate how well your heart functions. 3.  You will meet with Rolly Salter, RN Navigator, to review your chemotherapy teaching.  Once your teaching has been completed, you will be set-up with your chemotherapy appointments.  Thank you for choosing [facility] to provide your oncology and hematology care.  To afford each patient quality time with our providers, please arrive at least 15 minutes before your scheduled appointment time.  With your help, our goal is to use those 15 minutes to complete the necessary work-up to ensure our physicians have the information they need to help with your evaluation and healthcare recommendations.    Effective January 1st, 2014, we ask that you re-schedule your appointment with our physicians should you arrive 10 or more minutes late for your appointment.  We strive to give you quality time with our providers, and arriving late affects you and other patients whose appointments are after yours.    Again, thank you for choosing [facility].  Our hope is that these requests will decrease the amount of time that you wait before being seen by our physicians.       _____________________________________________________________  Should you have questions after your visit to [facility], please contact our office at [phone] between the hours of 8:30 a.m. and 5:00 p.m.  Voicemails left after 4:30 p.m. will not be returned until the following business day.  For prescription refill requests, have your pharmacy contact our office with your  prescription refill request.    Docetaxel injection What is this medicine? DOCETAXEL (doe se TAX el) is a chemotherapy drug. It targets fast dividing cells, like cancer cells, and causes these cells to die. This medicine is used to treat many types of cancers like breast cancer, certain stomach cancers, head and neck cancer, lung cancer, and prostate cancer. This medicine may be used for other purposes; ask your health care provider or pharmacist if you have questions. COMMON BRAND NAME(S): Docefrez , Taxotere What should I tell my health care provider before I take this medicine? They need to know if you have any of these conditions: -infection (especially a virus infection such as chickenpox, cold sores, or herpes) -liver disease -low blood counts, like low white cell, platelet, or red cell counts -an unusual or allergic reaction to docetaxel, polysorbate 80, other chemotherapy agents, other medicines, foods, dyes, or preservatives -pregnant or trying to get pregnant -breast-feeding How should I use this medicine? This drug is given as an infusion into a vein. It is administered in a hospital or clinic by a specially trained health care professional. Talk to your pediatrician regarding the use of this medicine in children. Special care may be needed. Overdosage: If you think you have taken too much of this medicine contact a poison control center or emergency room at once. NOTE: This medicine is only for you. Do not share this medicine with others. What if I miss a dose? It is important not to miss your dose. Call your doctor or health care professional if you are unable to keep an appointment. What  may interact with this medicine? -cyclosporine -erythromycin -ketoconazole -medicines to increase blood counts like filgrastim, pegfilgrastim, sargramostim -vaccines Talk to your doctor or health care professional before taking any of these  medicines: -acetaminophen -aspirin -ibuprofen -ketoprofen -naproxen This list may not describe all possible interactions. Give your health care provider a list of all the medicines, herbs, non-prescription drugs, or dietary supplements you use. Also tell them if you smoke, drink alcohol, or use illegal drugs. Some items may interact with your medicine. What should I watch for while using this medicine? Your condition will be monitored carefully while you are receiving this medicine. You will need important blood work done while you are taking this medicine. This drug may make you feel generally unwell. This is not uncommon, as chemotherapy can affect healthy cells as well as cancer cells. Report any side effects. Continue your course of treatment even though you feel ill unless your doctor tells you to stop. In some cases, you may be given additional medicines to help with side effects. Follow all directions for their use. Call your doctor or health care professional for advice if you get a fever, chills or sore throat, or other symptoms of a cold or flu. Do not treat yourself. This drug decreases your body's ability to fight infections. Try to avoid being around people who are sick. This medicine may increase your risk to bruise or bleed. Call your doctor or health care professional if you notice any unusual bleeding. Be careful brushing and flossing your teeth or using a toothpick because you may get an infection or bleed more easily. If you have any dental work done, tell your dentist you are receiving this medicine. Avoid taking products that contain aspirin, acetaminophen, ibuprofen, naproxen, or ketoprofen unless instructed by your doctor. These medicines may hide a fever. Do not become pregnant while taking this medicine. Women should inform their doctor if they wish to become pregnant or think they might be pregnant. There is a potential for serious side effects to an unborn child. Talk to  your health care professional or pharmacist for more information. Do not breast-feed an infant while taking this medicine. What side effects may I notice from receiving this medicine? Side effects that you should report to your doctor or health care professional as soon as possible: -allergic reactions like skin rash, itching or hives, swelling of the face, lips, or tongue -low blood counts - This drug may decrease the number of white blood cells, red blood cells and platelets. You may be at increased risk for infections and bleeding. -signs of infection - fever or chills, cough, sore throat, pain or difficulty passing urine -signs of decreased platelets or bleeding - bruising, pinpoint red spots on the skin, black, tarry stools, nosebleeds -signs of decreased red blood cells - unusually weak or tired, fainting spells, lightheadedness -breathing problems -fast or irregular heartbeat -low blood pressure -mouth sores -nausea and vomiting -pain, swelling, redness or irritation at the injection site -pain, tingling, numbness in the hands or feet -swelling of the ankle, feet, hands -weight gain Side effects that usually do not require medical attention (report to your prescriber or health care professional if they continue or are bothersome): -bone pain -complete hair loss including hair on your head, underarms, pubic hair, eyebrows, and eyelashes -diarrhea -excessive tearing -changes in the color of fingernails -loosening of the fingernails -nausea -muscle pain -red flush to skin -sweating -weak or tired This list may not describe all possible side effects. Call your  doctor for medical advice about side effects. You may report side effects to FDA at 1-800-FDA-1088. Where should I keep my medicine? This drug is given in a hospital or clinic and will not be stored at home. NOTE: This sheet is a summary. It may not cover all possible information. If you have questions about this medicine,  talk to your doctor, pharmacist, or health care provider.  2014, Elsevier/Gold Standard. (2008-07-28 11:52:10) Carboplatin injection What is this medicine? CARBOPLATIN (KAR boe pla tin) is a chemotherapy drug. It targets fast dividing cells, like cancer cells, and causes these cells to die. This medicine is used to treat ovarian cancer and many other cancers. This medicine may be used for other purposes; ask your health care provider or pharmacist if you have questions. COMMON BRAND NAME(S): Paraplatin What should I tell my health care provider before I take this medicine? They need to know if you have any of these conditions: -blood disorders -hearing problems -kidney disease -recent or ongoing radiation therapy -an unusual or allergic reaction to carboplatin, cisplatin, other chemotherapy, other medicines, foods, dyes, or preservatives -pregnant or trying to get pregnant -breast-feeding How should I use this medicine? This drug is usually given as an infusion into a vein. It is administered in a hospital or clinic by a specially trained health care professional. Talk to your pediatrician regarding the use of this medicine in children. Special care may be needed. Overdosage: If you think you have taken too much of this medicine contact a poison control center or emergency room at once. NOTE: This medicine is only for you. Do not share this medicine with others. What if I miss a dose? It is important not to miss a dose. Call your doctor or health care professional if you are unable to keep an appointment. What may interact with this medicine? -medicines for seizures -medicines to increase blood counts like filgrastim, pegfilgrastim, sargramostim -some antibiotics like amikacin, gentamicin, neomycin, streptomycin, tobramycin -vaccines Talk to your doctor or health care professional before taking any of these medicines: -acetaminophen -aspirin -ibuprofen -ketoprofen -naproxen This  list may not describe all possible interactions. Give your health care provider a list of all the medicines, herbs, non-prescription drugs, or dietary supplements you use. Also tell them if you smoke, drink alcohol, or use illegal drugs. Some items may interact with your medicine. What should I watch for while using this medicine? Your condition will be monitored carefully while you are receiving this medicine. You will need important blood work done while you are taking this medicine. This drug may make you feel generally unwell. This is not uncommon, as chemotherapy can affect healthy cells as well as cancer cells. Report any side effects. Continue your course of treatment even though you feel ill unless your doctor tells you to stop. In some cases, you may be given additional medicines to help with side effects. Follow all directions for their use. Call your doctor or health care professional for advice if you get a fever, chills or sore throat, or other symptoms of a cold or flu. Do not treat yourself. This drug decreases your body's ability to fight infections. Try to avoid being around people who are sick. This medicine may increase your risk to bruise or bleed. Call your doctor or health care professional if you notice any unusual bleeding. Be careful brushing and flossing your teeth or using a toothpick because you may get an infection or bleed more easily. If you have any dental work done,  tell your dentist you are receiving this medicine. Avoid taking products that contain aspirin, acetaminophen, ibuprofen, naproxen, or ketoprofen unless instructed by your doctor. These medicines may hide a fever. Do not become pregnant while taking this medicine. Women should inform their doctor if they wish to become pregnant or think they might be pregnant. There is a potential for serious side effects to an unborn child. Talk to your health care professional or pharmacist for more information. Do not  breast-feed an infant while taking this medicine. What side effects may I notice from receiving this medicine? Side effects that you should report to your doctor or health care professional as soon as possible: -allergic reactions like skin rash, itching or hives, swelling of the face, lips, or tongue -signs of infection - fever or chills, cough, sore throat, pain or difficulty passing urine -signs of decreased platelets or bleeding - bruising, pinpoint red spots on the skin, black, tarry stools, nosebleeds -signs of decreased red blood cells - unusually weak or tired, fainting spells, lightheadedness -breathing problems -changes in hearing -changes in vision -chest pain -high blood pressure -low blood counts - This drug may decrease the number of white blood cells, red blood cells and platelets. You may be at increased risk for infections and bleeding. -nausea and vomiting -pain, swelling, redness or irritation at the injection site -pain, tingling, numbness in the hands or feet -problems with balance, talking, walking -trouble passing urine or change in the amount of urine Side effects that usually do not require medical attention (report to your doctor or health care professional if they continue or are bothersome): -hair loss -loss of appetite -metallic taste in the mouth or changes in taste This list may not describe all possible side effects. Call your doctor for medical advice about side effects. You may report side effects to FDA at 1-800-FDA-1088. Where should I keep my medicine? This drug is given in a hospital or clinic and will not be stored at home. NOTE: This sheet is a summary. It may not cover all possible information. If you have questions about this medicine, talk to your doctor, pharmacist, or health care provider.  2014, Elsevier/Gold Standard. (2007-11-20 14:38:05) Trastuzumab injection for infusion What is this medicine? TRASTUZUMAB (tras TOO zoo mab) is a  monoclonal antibody. It targets a protein called HER2. This protein is found in some stomach and breast cancers. This medicine can stop cancer cell growth. This medicine may be used with other cancer treatments. This medicine may be used for other purposes; ask your health care provider or pharmacist if you have questions. COMMON BRAND NAME(S): Herceptin What should I tell my health care provider before I take this medicine? They need to know if you have any of these conditions: -heart disease -heart failure -infection (especially a virus infection such as chickenpox, cold sores, or herpes) -lung or breathing disease, like asthma -recent or ongoing radiation therapy -an unusual or allergic reaction to trastuzumab, benzyl alcohol, or other medications, foods, dyes, or preservatives -pregnant or trying to get pregnant -breast-feeding How should I use this medicine? This drug is given as an infusion into a vein. It is administered in a hospital or clinic by a specially trained health care professional. Talk to your pediatrician regarding the use of this medicine in children. This medicine is not approved for use in children. Overdosage: If you think you have taken too much of this medicine contact a poison control center or emergency room at once. NOTE: This medicine is  only for you. Do not share this medicine with others. What if I miss a dose? It is important not to miss a dose. Call your doctor or health care professional if you are unable to keep an appointment. What may interact with this medicine? -cyclophosphamide -doxorubicin -warfarin This list may not describe all possible interactions. Give your health care provider a list of all the medicines, herbs, non-prescription drugs, or dietary supplements you use. Also tell them if you smoke, drink alcohol, or use illegal drugs. Some items may interact with your medicine. What should I watch for while using this medicine? Visit your doctor  for checks on your progress. Report any side effects. Continue your course of treatment even though you feel ill unless your doctor tells you to stop. Call your doctor or health care professional for advice if you get a fever, chills or sore throat, or other symptoms of a cold or flu. Do not treat yourself. Try to avoid being around people who are sick. You may experience fever, chills and shaking during your first infusion. These effects are usually mild and can be treated with other medicines. Report any side effects during the infusion to your health care professional. Fever and chills usually do not happen with later infusions. What side effects may I notice from receiving this medicine? Side effects that you should report to your doctor or other health care professional as soon as possible: -breathing difficulties -chest pain or palpitations -cough -dizziness or fainting -fever or chills, sore throat -skin rash, itching or hives -swelling of the legs or ankles -unusually weak or tired Side effects that usually do not require medical attention (report to your doctor or other health care professional if they continue or are bothersome): -loss of appetite -headache -muscle aches -nausea This list may not describe all possible side effects. Call your doctor for medical advice about side effects. You may report side effects to FDA at 1-800-FDA-1088. Where should I keep my medicine? This drug is given in a hospital or clinic and will not be stored at home. NOTE: This sheet is a summary. It may not cover all possible information. If you have questions about this medicine, talk to your doctor, pharmacist, or health care provider.  2014, Elsevier/Gold Standard. (2009-06-19 13:43:15) Implanted Port Instructions An implanted port is a central line that has a round shape and is placed under the skin. It is used for long-term IV (intravenous) access for:  Medicine.  Fluids.  Liquid nutrition,  such as TPN (total parenteral nutrition).  Blood samples. Ports can be placed:  In the chest area just below the collarbone (this is the most common place.)  In the arms.  In the belly (abdomen) area.  In the legs. PARTS OF THE PORT A port has 2 main parts:  The reservoir. The reservoir is round, disc-shaped, and will be a small, raised area under your skin.  The reservoir is the part where a needle is inserted (accessed) to either give medicines or to draw blood.  The catheter. The catheter is a long, slender tube that extends from the reservoir. The catheter is placed into a large vein.  Medicine that is inserted into the reservoir goes into the catheter and then into the vein. INSERTION OF THE PORT  The port is surgically placed in either an operating room or in a procedural area (interventional radiology).  Medicine may be given to help you relax during the procedure.  The skin where the port will be  inserted is numbed (local anesthetic).  1 or 2 small cuts (incisions) will be made in the skin to insert the port.  The port can be used after it has been inserted. INCISION SITE CARE  The incision site may have small adhesive strips on it. This helps keep the incision site closed. Sometimes, no adhesive strips are placed. Instead of adhesive strips, a special kind of surgical glue is used to keep the incision closed.  If adhesive strips were placed on the incision sites, do not take them off. They will fall off on their own.  The incision site may be sore for 1 to 2 days. Pain medicine can help.  Do not get the incision site wet. Bathe or shower as directed by your caregiver.  The incision site should heal in 5 to 7 days. A small scar may form after the incision has healed. ACCESSING THE PORT Special steps must be taken to access the port:  Before the port is accessed, a numbing cream can be placed on the skin. This helps numb the skin over the port site.  A sterile  technique is used to access the port.  The port is accessed with a needle. Only "non-coring" port needles should be used to access the port. Once the port is accessed, a blood return should be checked. This helps ensure the port is in the vein and is not clogged (clotted).  If your caregiver believes your port should remain accessed, a clear (transparent) bandage will be placed over the needle site. The bandage and needle will need to be changed every week or as directed by your caregiver.  Keep the bandage covering the needle clean and dry. Do not get it wet. Follow your caregiver's instructions on how to take a shower or bath when the port is accessed.  If your port does not need to stay accessed, no bandage is needed over the port. FLUSHING THE PORT Flushing the port keeps it from getting clogged. How often the port is flushed depends on:  If a constant infusion is running. If a constant infusion is running, the port may not need to be flushed.  If intermittent medicines are given.  If the port is not being used. For intermittent medicines:  The port will need to be flushed:  After medicines have been given.  After blood has been drawn.  As part of routine maintenance.  A port is normally flushed with:  Normal saline.  Heparin.  Follow your caregiver's advice on how often, how much, and the type of flush to use on your port. IMPORTANT PORT INFORMATION  Tell your caregiver if you are allergic to heparin.  After your port is placed, you will get a manufacturer's information card. The card has information about your port. Keep this card with you at all times.  There are many types of ports available. Know what kind of port you have.  In case of an emergency, it may be helpful to wear a medical alert bracelet. This can help alert health care workers that you have a port.  The port can stay in for as long as your caregiver believes it is necessary.  When it is time for the  port to come out, surgery will be done to remove it. The surgery will be similar to how the port was put in.  If you are in the hospital or clinic:  Your port will be taken care of and flushed by a nurse.  If  you are at home:  A home health care nurse may give medicines and take care of the port.  You or a family member can get special training and directions for giving medicine and taking care of the port at home. SEEK IMMEDIATE MEDICAL CARE IF:   Your port does not flush or you are unable to get a blood return.  New drainage or pus is coming from the incision.  A bad smell is coming from the incision site.  You develop swelling or increased redness at the incision site.  You develop increased swelling or pain at the port site.  You develop swelling or pain in the surrounding skin near the port.  You have an oral temperature above 102 F (38.9 C), not controlled by medicine. MAKE SURE YOU:   Understand these instructions.  Will watch your condition.  Will get help right away if you are not doing well or get worse. Document Released: 08/15/2005 Document Revised: 11/07/2011 Document Reviewed: 11/06/2008 [employer] Patient Information 2014 [place], [place]. MUGA Testing The medical term for the MUGA test is Electrocardiographic Multi-Gated Acquisition Study with First Pass Radionuclide Angiography (MUGA). MEANING OF TEST  The purpose of this test to check on how your heart's ventricles are working. It does this by looking at the muscle motion of the heart muscle while it is beating. The ventricles are the large muscular chambers in the lower part of your heart that pump blood to your lungs (right ventricle) and to the rest of your body (left ventricle). The test also checks the ejection fraction of the heart. This is the amount of blood that is being pushed out by the heart. The purpose of a MUGA study is to evaluate right and left ventricular wall motion and ejection fraction.  There is no preparation for this test. NORMAL FINDINGS  Normal myocardial ejection fraction and coronary perfusion. Ranges for normal findings may vary among different laboratories and hospitals. You should always check with your doctor after having lab work or other tests done to discuss the meaning of your test results and whether your values are considered within normal limits. OBTAINING THE TEST RESULTS It is your responsibility to obtain your test results. Ask the lab or department performing the test when and how you will get your results. Document Released: 10/04/2005 Document Revised: 11/07/2011 Document Reviewed: 11/30/2006 [facility] Patient Information 2014 [place], [place]. Pegfilgrastim injection What is this medicine? PEGFILGRASTIM (peg fil GRA stim) helps the body make more white blood cells. It is used to prevent infection in people with low amounts of white blood cells following cancer treatment. This medicine may be used for other purposes; ask your health care provider or pharmacist if you have questions. COMMON BRAND NAME(S): Neulasta What should I tell my health care provider before I take this medicine? They need to know if you have any of these conditions: -sickle cell disease -an unusual or allergic reaction to pegfilgrastim, filgrastim, E.coli protein, other medicines, foods, dyes, or preservatives -pregnant or trying to get pregnant -breast-feeding How should I use this medicine? This medicine is for injection under the skin. It is usually given by a health care professional in a hospital or clinic setting. If you get this medicine at home, you will be taught how to prepare and give this medicine. Do not shake this medicine. Use exactly as directed. Take your medicine at regular intervals. Do not take your medicine more often than directed. It is important that you put your used needles and  syringes in a special sharps container. Do not put them in a trash can. If you do  not have a sharps container, call your pharmacist or healthcare provider to get one. Talk to your pediatrician regarding the use of this medicine in children. While this drug may be prescribed for children who weigh more than 45 kg for selected conditions, precautions do apply Overdosage: If you think you have taken too much of this medicine contact a poison control center or emergency room at once. NOTE: This medicine is only for you. Do not share this medicine with others. What if I miss a dose? If you miss a dose, take it as soon as you can. If it is almost time for your next dose, take only that dose. Do not take double or extra doses. What may interact with this medicine? -lithium -medicines for growth therapy This list may not describe all possible interactions. Give your health care provider a list of all the medicines, herbs, non-prescription drugs, or dietary supplements you use. Also tell them if you smoke, drink alcohol, or use illegal drugs. Some items may interact with your medicine. What should I watch for while using this medicine? Visit your doctor for regular check ups. You will need important blood work done while you are taking this medicine. What side effects may I notice from receiving this medicine? Side effects that you should report to your doctor or health care professional as soon as possible: -allergic reactions like skin rash, itching or hives, swelling of the face, lips, or tongue -breathing problems -fever -pain, redness, or swelling where injected -shoulder pain -stomach or side pain Side effects that usually do not require medical attention (report to your doctor or health care professional if they continue or are bothersome): -aches, pains -headache -loss of appetite -nausea, vomiting -unusually tired This list may not describe all possible side effects. Call your doctor for medical advice about side effects. You may report side effects to FDA at  1-800-FDA-1088. Where should I keep my medicine? Keep out of the reach of children. Store in a refrigerator between 2 and 8 degrees C (36 and 46 degrees F). Do not freeze. Keep in carton to protect from light. Throw away this medicine if it is left out of the refrigerator for more than 48 hours. Throw away any unused medicine after the expiration date. NOTE: This sheet is a summary. It may not cover all possible information. If you have questions about this medicine, talk to your doctor, pharmacist, or health care provider.  2014, [employer]. (2008-03-17 15:41:44) Implanted Port Instructions An implanted port is a central line that has a round shape and is placed under the skin. It is used for long-term IV (intravenous) access for:  Medicine.  Fluids.  Liquid nutrition, such as TPN (total parenteral nutrition).  Blood samples. Ports can be placed:  In the chest area just below the collarbone (this is the most common place.)  In the arms.  In the belly (abdomen) area.  In the legs. PARTS OF THE PORT A port has 2 main parts:  The reservoir. The reservoir is round, disc-shaped, and will be a small, raised area under your skin.  The reservoir is the part where a needle is inserted (accessed) to either give medicines or to draw blood.  The catheter. The catheter is a long, slender tube that extends from the reservoir. The catheter is placed into a large vein.  Medicine that is inserted into the reservoir goes  into the catheter and then into the vein. INSERTION OF THE PORT  The port is surgically placed in either an operating room or in a procedural area (interventional radiology).  Medicine may be given to help you relax during the procedure.  The skin where the port will be inserted is numbed (local anesthetic).  1 or 2 small cuts (incisions) will be made in the skin to insert the port.  The port can be used after it has been inserted. INCISION SITE CARE  The  incision site may have small adhesive strips on it. This helps keep the incision site closed. Sometimes, no adhesive strips are placed. Instead of adhesive strips, a special kind of surgical glue is used to keep the incision closed.  If adhesive strips were placed on the incision sites, do not take them off. They will fall off on their own.  The incision site may be sore for 1 to 2 days. Pain medicine can help.  Do not get the incision site wet. Bathe or shower as directed by your caregiver.  The incision site should heal in 5 to 7 days. A small scar may form after the incision has healed. ACCESSING THE PORT Special steps must be taken to access the port:  Before the port is accessed, a numbing cream can be placed on the skin. This helps numb the skin over the port site.  A sterile technique is used to access the port.  The port is accessed with a needle. Only "non-coring" port needles should be used to access the port. Once the port is accessed, a blood return should be checked. This helps ensure the port is in the vein and is not clogged (clotted).  If your caregiver believes your port should remain accessed, a clear (transparent) bandage will be placed over the needle site. The bandage and needle will need to be changed every week or as directed by your caregiver.  Keep the bandage covering the needle clean and dry. Do not get it wet. Follow your caregiver's instructions on how to take a shower or bath when the port is accessed.  If your port does not need to stay accessed, no bandage is needed over the port. FLUSHING THE PORT Flushing the port keeps it from getting clogged. How often the port is flushed depends on:  If a constant infusion is running. If a constant infusion is running, the port may not need to be flushed.  If intermittent medicines are given.  If the port is not being used. For intermittent medicines:  The port will need to be flushed:  After medicines have been  given.  After blood has been drawn.  As part of routine maintenance.  A port is normally flushed with:  Normal saline.  Heparin.  Follow your caregiver's advice on how often, how much, and the type of flush to use on your port. IMPORTANT PORT INFORMATION  Tell your caregiver if you are allergic to heparin.  After your port is placed, you will get a manufacturer's information card. The card has information about your port. Keep this card with you at all times.  There are many types of ports available. Know what kind of port you have.  In case of an emergency, it may be helpful to wear a medical alert bracelet. This can help alert health care workers that you have a port.  The port can stay in for as long as your caregiver believes it is necessary.  When it is  time for the port to come out, surgery will be done to remove it. The surgery will be similar to how the port was put in.  If you are in the hospital or clinic:  Your port will be taken care of and flushed by a nurse.  If you are at home:  A home health care nurse may give medicines and take care of the port.  You or a family member can get special training and directions for giving medicine and taking care of the port at home. SEEK IMMEDIATE MEDICAL CARE IF:   Your port does not flush or you are unable to get a blood return.  New drainage or pus is coming from the incision.  A bad smell is coming from the incision site.  You develop swelling or increased redness at the incision site.  You develop increased swelling or pain at the port site.  You develop swelling or pain in the surrounding skin near the port.  You have an oral temperature above 102 F (38.9 C), not controlled by medicine. MAKE SURE YOU:   Understand these instructions.  Will watch your condition.  Will get help right away if you are not doing well or get worse. Document Released: 08/15/2005 Document Revised: 11/07/2011 Document Reviewed:  11/06/2008 [employer] Patient Information 2014 [place], [place]. MUGA Testing The medical term for the MUGA test is Electrocardiographic Multi-Gated Acquisition Study with First Pass Radionuclide Angiography (MUGA). MEANING OF TEST  The purpose of this test to check on how your heart's ventricles are working. It does this by looking at the muscle motion of the heart muscle while it is beating. The ventricles are the large muscular chambers in the lower part of your heart that pump blood to your lungs (right ventricle) and to the rest of your body (left ventricle). The test also checks the ejection fraction of the heart. This is the amount of blood that is being pushed out by the heart. The purpose of a MUGA study is to evaluate right and left ventricular wall motion and ejection fraction. There is no preparation for this test. NORMAL FINDINGS  Normal myocardial ejection fraction and coronary perfusion. Ranges for normal findings may vary among different laboratories and hospitals. You should always check with your doctor after having lab work or other tests done to discuss the meaning of your test results and whether your values are considered within normal limits. OBTAINING THE TEST RESULTS It is your responsibility to obtain your test results. Ask the lab or department performing the test when and how you will get your results. Document Released: 10/04/2005 Document Revised: 11/07/2011 Document Reviewed: 11/30/2006 [facility] Patient Information 2014 [place], [place].

## 2013-08-10 LAB — CANCER ANTIGEN 27.29: CA 27.29: 26 U/mL (ref 0–39)

## 2013-08-10 LAB — CEA: CEA: <0.5 ng/mL (ref 0.0–5.0)

## 2013-08-12 ENCOUNTER — Other Ambulatory Visit (HOSPITAL_COMMUNITY): Payer: Self-pay | Admitting: Hematology and Oncology

## 2013-08-12 MED ORDER — LIDOCAINE-PRILOCAINE 2.5-2.5 % EX CREA: TOPICAL_CREAM | CUTANEOUS | Status: DC

## 2013-08-12 MED ORDER — PROCHLORPERAZINE MALEATE 10 MG PO TABS: ORAL_TABLET | ORAL | Status: DC

## 2013-08-12 MED ORDER — METOCLOPRAMIDE HCL 5 MG PO TABS: ORAL_TABLET | ORAL | Status: DC

## 2013-08-12 MED ORDER — DEXAMETHASONE 4 MG PO TABS: ORAL_TABLET | ORAL | Status: DC

## 2013-08-12 NOTE — Patient Instructions (Addendum)
[facility]   CHEMOTHERAPY INSTRUCTIONS   Taxotere - bone marrow suppression (lowers white blood cells (fight infection), lowers red blood cells (make up your blood), lowers platelets (help blood to clot). This chemo can cause fluid retention. You will be responsible for taking a steroid called Dexamethasone at home prior to and after Taxotere. This steroid will keep you from having fluid retention. Take it whether you think you need it or not. Other side effects: hair loss, skin/nail changes (darkening of the nail beds, pain where the nail bed meets the skin, loosening of the nail beds, dry skin, palms of hands and soles of feet may darken or get sensitive, nausea/vomiting, paresthesia (numbness or tingling) in extremities - we need to know if this develops, mucositis (inflammation of any mucosal membrane - the mouth, throat), mouth sores, neurotoxicity (loss of memory, headaches, trouble sleeping, etc.), can also cause excessive tear production. Please let us know if any side effect develops. Lotion your hands and feet twice a day if you can (at least once a day). Avoid repetitive movement and friction to your hands/feet. Avoid hot temperatures to your hands/feet. If you notice your hands, fingers, feet, toes are beginning to feel/look sore, red, cracking, painful, sensitive, etc - let us know!   Carboplatin - this medication can be hard on your kidneys - this is why we need you to drink 64 oz of fluid (preferably water/decaff fluids) 2 days prior to chemo and for up to 4-5 days after chemo. Drink more if you can. This will help to keep your kidneys flushed. This can cause mild hair loss, lower your platelets (which make your blood clot), lower your white blood cells (fight infection), and cause nausea/vomiting.   Herceptin - this is given to patients who overexpress HER2. Side Effects that may occur during infusion include: chills, fever, headache, dizziness, shortness  of breath, low blood pressure, rash. This does not generally happen. We will have to perform MUGA scans or 2D echoes periodically during treatment however because a side effect of this drug can be cardiotoxicity. The MUGA scan/2D echo checks the pumping muscle of the heart, the left ventricle, to make sure that it is not weakening from the Herceptin. Before you receive Herceptin, we will administer Tylenol 650mg  and Benadryl 50mg  in the pill form. This is to reduce the fever & chills that can occur from the Herceptin infusion.   You will receive the above 3 medications every 21 days for 6 cycles. Once the 6 cycles are completed, you will continue Herceptin therapy every 21 days x 1 year. Along with Herceptin you will take a pill daily that blocks estrogen. You will take that pill for 5 years.   Before your 6 cycles of chemo, you will receive premedications via your IV/port. The two medications you will receive before your chemo are Zofran and Dexamethasone. Zofran is to reduce/prevent nausea/vomiting. Dexamethasone is to decrease the risk of having fluid retention and nausea/vomiting. Some side effects of the Dexamethasone include: feeling anxious, make you irritable, have an increase in energy, trouble sleeping, turning red in the face, neck, & shoulders (this may last a couple of days).   POTENTIAL SIDE EFFECTS OF TREATMENT: Increased Susceptibility to Infection/Bone Marrow Suppression, Nausea/Vomiting/Stomach Cramping, Constipation/Diarrhea, Hair Thinning/Hair Loss, Changes in Character of Skin and Nails (brittleness, dryness,etc.), Pigment Changes (darkening of nail beds, palms of hands, soles of feet, etc.), Sun Sensitivity and Mouth Sores   SELF IMAGE NEEDS AND REFERRALS  MADE: Obtain hair accessories as soon as possible (wigs, scarves, turbans,caps,etc.)  Referral to Look Good, Feel Better consultant   EDUCATIONAL MATERIALS GIVEN AND REVIEWED: Chemotherapy and You Booklet Specific  Instructions Sheets: Carboplatin, Taxotere, Herceptin, IV Dexamethasone, IV Zofran, Reglan/Metoclopramide, Compazine/Prochlorperazine, Dexamethasone by mouth, EMLA cream.   SELF CARE ACTIVITIES WHILE ON CHEMOTHERAPY: Increase your fluid intake 48 hours prior to treatment and drink at least 2 quarts per day after treatment., No alcohol intake., No aspirin or other medications unless approved by your oncologist., Eat foods that are light and easy to digest., Eat foods at cold or room temperature., No fried, fatty, or spicy foods immediately before or after treatment., Have teeth cleaned professionally before starting treatment. Keep dentures and partial plates clean., Use soft toothbrush and do not use mouthwashes that contain alcohol. Biotene is a good mouthwash. Use warm salt water gargles (1 teaspoon salt per 1 quart warm water) before and after meals and at bedtime. Or you may rinse with 2 tablespoons of three -percent hydrogen peroxide mixed in eight ounces of water., Always use sunscreen with SPF (Sun Protection Factor) of 30 or higher., Use your nausea medication as directed to prevent nausea., Use your stool softener or laxative as directed to prevent constipation. and Use your anti-diarrheal medication as directed to stop diarrhea.  Please wash your hands for at least 30 seconds using warm soapy water. Handwashing is the #1 way to prevent the spread of germs. Stay away from sick people or people who are getting over a cold. If you develop respiratory systems such as green/yellow mucus production or productive cough or persistent cough let us know and we will see if you need an antibiotic. It is a good idea to keep a pair of gloves on when going into grocery stores/[employer] to decrease your risk of coming into contact with germs on the carts, etc. Carry alcohol hand gel with you at all times and use it frequently if out in public. All foods need to be cooked thoroughly. No raw foods. No medium or  undercooked meats, eggs. If your food is cooked medium well, it does not need to be hot pink or saturated with bloody liquid at all. Vegetables and fruits need to be washed/rinsed under the faucet with a dish detergent before being consumed. You can eat raw fruits and vegetables unless we tell you otherwise but it would be best if you cooked them or bought frozen. Do not eat off of salad bars or hot bars unless you really trust the cleanliness of the restaurant. If you need dental work, please let Dr. Mariel Sleet know before you go for your appointment so that we can coordinate the best possible time for you in regards to your chemo regimen. You need to also let your dentist know that you are actively taking chemo. We may need to do labs prior to your dental appointment. We also want your bowels moving at least every other day. If this is not happening, we need to know so that we can get you on a bowel regimen to help you go.     MEDICATIONS: You have been given prescriptions for the following medications:  Dexamethasone 4mg  tablet. The day before chemo, take 2 tabs in the am and 2 tabs in the pm. Starting the day after chemo, take 2 tabs in the am and 2 tabs in the pm for 2 days. Take with food. Repeat this with each chemo cycle.   Reglan/Metoclopramide 5mg  tablet. Starting the day  after chemo, take 1-2 tablets four times a day x 48 hours. Then may take 1-2 tablets four times a day IF needed for nausea/vomiting.   Compazine/Prochlorperazine 10mg  tablet. Starting the day after chemo, take 1 tablet four times a day x 48 hours. Then may take 1 tablet four times a day IF needed for nausea/vomiting.   EMLA cream. Apply a quarter size amount to port site 1 hour prior to chemo. Do not rub in. Cover with plastic wrap.     Over-the-Counter Meds:   Colace - this is a stool softener. Take 100mg  capsule 2-6 times a day as needed. If you have to take more than 6 capsules of Colace a day call the Cancer  Center.  Senna - this is a mild laxative used to treat mild constipation. May take up to 3 tabs by mouth nightly at bedtime.   Milk of Magnesia - this is a laxative used to treat moderate to severe constipation. May take 2-4 tablespoons every 8 hours as needed. May increase to 8 tablespoons x 1 dose and if no bowel movement call the Cancer Center.  Imodium - this is for diarrhea. Take 2 tabs after 1st loose stool and then 1 tab after each loose stool until you go a total of 12 hours without a loose stool. Call Cancer Center if loose stools continue. If it is the weekday, please call the Cancer Center and we can call you in some Lomotil which is a prescription strength anti-diarrheal.    SYMPTOMS TO REPORT AS SOON AS POSSIBLE AFTER TREATMENT:  FEVER GREATER THAN 100.5 F  CHILLS WITH OR WITHOUT FEVER  NAUSEA AND VOMITING THAT IS NOT CONTROLLED WITH YOUR NAUSEA MEDICATION  UNUSUAL SHORTNESS OF BREATH  UNUSUAL BRUISING OR BLEEDING  TENDERNESS IN MOUTH AND THROAT WITH OR WITHOUT PRESENCE OF ULCERS  URINARY PROBLEMS  BOWEL PROBLEMS  UNUSUAL RASH    Wear comfortable clothing and clothing appropriate for easy access to any Portacath or PICC line. Let us know if there is anything that we can do to make your therapy better!      I have been informed and understand all of the instructions given to me and have received a copy. I have been instructed to call the clinic [phone] or my family physician as soon as possible for continued medical care, if indicated. I do not have any more questions at this time but understand that I may call the Cancer Center or the Patient Navigator at [phone] during office hours should I have questions or need assistance in obtaining follow-up care.      _________________________________________      _______________     __________ Signature of Patient or Authorized Representative        Date                             Time      _________________________________________ Nurse's Signature      Carboplatin injection What is this medicine? CARBOPLATIN (KAR boe pla tin) is a chemotherapy drug. It targets fast dividing cells, like cancer cells, and causes these cells to die. This medicine is used to treat ovarian cancer and many other cancers. This medicine may be used for other purposes; ask your health care provider or pharmacist if you have questions. COMMON BRAND NAME(S): Paraplatin What should I tell my health care provider before I take this medicine? They need to know if  you have any of these conditions: -blood disorders -hearing problems -kidney disease -recent or ongoing radiation therapy -an unusual or allergic reaction to carboplatin, cisplatin, other chemotherapy, other medicines, foods, dyes, or preservatives -pregnant or trying to get pregnant -breast-feeding How should I use this medicine? This drug is usually given as an infusion into a vein. It is administered in a hospital or clinic by a specially trained health care professional. Talk to your pediatrician regarding the use of this medicine in children. Special care may be needed. Overdosage: If you think you have taken too much of this medicine contact a poison control center or emergency room at once. NOTE: This medicine is only for you. Do not share this medicine with others. What if I miss a dose? It is important not to miss a dose. Call your doctor or health care professional if you are unable to keep an appointment. What may interact with this medicine? -medicines for seizures -medicines to increase blood counts like filgrastim, pegfilgrastim, sargramostim -some antibiotics like amikacin, gentamicin, neomycin, streptomycin, tobramycin -vaccines Talk to your doctor or health care professional before taking any of these medicines: -acetaminophen -aspirin -ibuprofen -ketoprofen -naproxen This list may not describe all  possible interactions. Give your health care provider a list of all the medicines, herbs, non-prescription drugs, or dietary supplements you use. Also tell them if you smoke, drink alcohol, or use illegal drugs. Some items may interact with your medicine. What should I watch for while using this medicine? Your condition will be monitored carefully while you are receiving this medicine. You will need important blood work done while you are taking this medicine. This drug may make you feel generally unwell. This is not uncommon, as chemotherapy can affect healthy cells as well as cancer cells. Report any side effects. Continue your course of treatment even though you feel ill unless your doctor tells you to stop. In some cases, you may be given additional medicines to help with side effects. Follow all directions for their use. Call your doctor or health care professional for advice if you get a fever, chills or sore throat, or other symptoms of a cold or flu. Do not treat yourself. This drug decreases your body's ability to fight infections. Try to avoid being around people who are sick. This medicine may increase your risk to bruise or bleed. Call your doctor or health care professional if you notice any unusual bleeding. Be careful brushing and flossing your teeth or using a toothpick because you may get an infection or bleed more easily. If you have any dental work done, tell your dentist you are receiving this medicine. Avoid taking products that contain aspirin, acetaminophen, ibuprofen, naproxen, or ketoprofen unless instructed by your doctor. These medicines may hide a fever. Do not become pregnant while taking this medicine. Women should inform their doctor if they wish to become pregnant or think they might be pregnant. There is a potential for serious side effects to an unborn child. Talk to your health care professional or pharmacist for more information. Do not breast-feed an infant while taking  this medicine. What side effects may I notice from receiving this medicine? Side effects that you should report to your doctor or health care professional as soon as possible: -allergic reactions like skin rash, itching or hives, swelling of the face, lips, or tongue -signs of infection - fever or chills, cough, sore throat, pain or difficulty passing urine -signs of decreased platelets or bleeding - bruising, pinpoint red spots  on the skin, black, tarry stools, nosebleeds -signs of decreased red blood cells - unusually weak or tired, fainting spells, lightheadedness -breathing problems -changes in hearing -changes in vision -chest pain -high blood pressure -low blood counts - This drug may decrease the number of white blood cells, red blood cells and platelets. You may be at increased risk for infections and bleeding. -nausea and vomiting -pain, swelling, redness or irritation at the injection site -pain, tingling, numbness in the hands or feet -problems with balance, talking, walking -trouble passing urine or change in the amount of urine Side effects that usually do not require medical attention (report to your doctor or health care professional if they continue or are bothersome): -hair loss -loss of appetite -metallic taste in the mouth or changes in taste This list may not describe all possible side effects. Call your doctor for medical advice about side effects. You may report side effects to FDA at 1-800-FDA-1088. Where should I keep my medicine? This drug is given in a hospital or clinic and will not be stored at home. NOTE: This sheet is a summary. It may not cover all possible information. If you have questions about this medicine, talk to your doctor, pharmacist, or health care provider.  2014, Elsevier/Gold Standard. (2007-11-20 14:38:05) Docetaxel injection What is this medicine? DOCETAXEL (doe se TAX el) is a chemotherapy drug. It targets fast dividing cells, like cancer  cells, and causes these cells to die. This medicine is used to treat many types of cancers like breast cancer, certain stomach cancers, head and neck cancer, lung cancer, and prostate cancer. This medicine may be used for other purposes; ask your health care provider or pharmacist if you have questions. COMMON BRAND NAME(S): Docefrez , Taxotere What should I tell my health care provider before I take this medicine? They need to know if you have any of these conditions: -infection (especially a virus infection such as chickenpox, cold sores, or herpes) -liver disease -low blood counts, like low white cell, platelet, or red cell counts -an unusual or allergic reaction to docetaxel, polysorbate 80, other chemotherapy agents, other medicines, foods, dyes, or preservatives -pregnant or trying to get pregnant -breast-feeding How should I use this medicine? This drug is given as an infusion into a vein. It is administered in a hospital or clinic by a specially trained health care professional. Talk to your pediatrician regarding the use of this medicine in children. Special care may be needed. Overdosage: If you think you have taken too much of this medicine contact a poison control center or emergency room at once. NOTE: This medicine is only for you. Do not share this medicine with others. What if I miss a dose? It is important not to miss your dose. Call your doctor or health care professional if you are unable to keep an appointment. What may interact with this medicine? -cyclosporine -erythromycin -ketoconazole -medicines to increase blood counts like filgrastim, pegfilgrastim, sargramostim -vaccines Talk to your doctor or health care professional before taking any of these medicines: -acetaminophen -aspirin -ibuprofen -ketoprofen -naproxen This list may not describe all possible interactions. Give your health care provider a list of all the medicines, herbs, non-prescription drugs, or  dietary supplements you use. Also tell them if you smoke, drink alcohol, or use illegal drugs. Some items may interact with your medicine. What should I watch for while using this medicine? Your condition will be monitored carefully while you are receiving this medicine. You will need important blood work done  while you are taking this medicine. This drug may make you feel generally unwell. This is not uncommon, as chemotherapy can affect healthy cells as well as cancer cells. Report any side effects. Continue your course of treatment even though you feel ill unless your doctor tells you to stop. In some cases, you may be given additional medicines to help with side effects. Follow all directions for their use. Call your doctor or health care professional for advice if you get a fever, chills or sore throat, or other symptoms of a cold or flu. Do not treat yourself. This drug decreases your body's ability to fight infections. Try to avoid being around people who are sick. This medicine may increase your risk to bruise or bleed. Call your doctor or health care professional if you notice any unusual bleeding. Be careful brushing and flossing your teeth or using a toothpick because you may get an infection or bleed more easily. If you have any dental work done, tell your dentist you are receiving this medicine. Avoid taking products that contain aspirin, acetaminophen, ibuprofen, naproxen, or ketoprofen unless instructed by your doctor. These medicines may hide a fever. Do not become pregnant while taking this medicine. Women should inform their doctor if they wish to become pregnant or think they might be pregnant. There is a potential for serious side effects to an unborn child. Talk to your health care professional or pharmacist for more information. Do not breast-feed an infant while taking this medicine. What side effects may I notice from receiving this medicine? Side effects that you should report to  your doctor or health care professional as soon as possible: -allergic reactions like skin rash, itching or hives, swelling of the face, lips, or tongue -low blood counts - This drug may decrease the number of white blood cells, red blood cells and platelets. You may be at increased risk for infections and bleeding. -signs of infection - fever or chills, cough, sore throat, pain or difficulty passing urine -signs of decreased platelets or bleeding - bruising, pinpoint red spots on the skin, black, tarry stools, nosebleeds -signs of decreased red blood cells - unusually weak or tired, fainting spells, lightheadedness -breathing problems -fast or irregular heartbeat -low blood pressure -mouth sores -nausea and vomiting -pain, swelling, redness or irritation at the injection site -pain, tingling, numbness in the hands or feet -swelling of the ankle, feet, hands -weight gain Side effects that usually do not require medical attention (report to your prescriber or health care professional if they continue or are bothersome): -bone pain -complete hair loss including hair on your head, underarms, pubic hair, eyebrows, and eyelashes -diarrhea -excessive tearing -changes in the color of fingernails -loosening of the fingernails -nausea -muscle pain -red flush to skin -sweating -weak or tired This list may not describe all possible side effects. Call your doctor for medical advice about side effects. You may report side effects to FDA at 1-800-FDA-1088. Where should I keep my medicine? This drug is given in a hospital or clinic and will not be stored at home. NOTE: This sheet is a summary. It may not cover all possible information. If you have questions about this medicine, talk to your doctor, pharmacist, or health care provider.  2014, Elsevier/Gold Standard. (2008-07-28 11:52:10) Trastuzumab injection for infusion What is this medicine? TRASTUZUMAB (tras TOO zoo mab) is a monoclonal  antibody. It targets a protein called HER2. This protein is found in some stomach and breast cancers. This medicine can stop cancer  cell growth. This medicine may be used with other cancer treatments. This medicine may be used for other purposes; ask your health care provider or pharmacist if you have questions. COMMON BRAND NAME(S): Herceptin What should I tell my health care provider before I take this medicine? They need to know if you have any of these conditions: -heart disease -heart failure -infection (especially a virus infection such as chickenpox, cold sores, or herpes) -lung or breathing disease, like asthma -recent or ongoing radiation therapy -an unusual or allergic reaction to trastuzumab, benzyl alcohol, or other medications, foods, dyes, or preservatives -pregnant or trying to get pregnant -breast-feeding How should I use this medicine? This drug is given as an infusion into a vein. It is administered in a hospital or clinic by a specially trained health care professional. Talk to your pediatrician regarding the use of this medicine in children. This medicine is not approved for use in children. Overdosage: If you think you have taken too much of this medicine contact a poison control center or emergency room at once. NOTE: This medicine is only for you. Do not share this medicine with others. What if I miss a dose? It is important not to miss a dose. Call your doctor or health care professional if you are unable to keep an appointment. What may interact with this medicine? -cyclophosphamide -doxorubicin -warfarin This list may not describe all possible interactions. Give your health care provider a list of all the medicines, herbs, non-prescription drugs, or dietary supplements you use. Also tell them if you smoke, drink alcohol, or use illegal drugs. Some items may interact with your medicine. What should I watch for while using this medicine? Visit your doctor for checks  on your progress. Report any side effects. Continue your course of treatment even though you feel ill unless your doctor tells you to stop. Call your doctor or health care professional for advice if you get a fever, chills or sore throat, or other symptoms of a cold or flu. Do not treat yourself. Try to avoid being around people who are sick. You may experience fever, chills and shaking during your first infusion. These effects are usually mild and can be treated with other medicines. Report any side effects during the infusion to your health care professional. Fever and chills usually do not happen with later infusions. What side effects may I notice from receiving this medicine? Side effects that you should report to your doctor or other health care professional as soon as possible: -breathing difficulties -chest pain or palpitations -cough -dizziness or fainting -fever or chills, sore throat -skin rash, itching or hives -swelling of the legs or ankles -unusually weak or tired Side effects that usually do not require medical attention (report to your doctor or other health care professional if they continue or are bothersome): -loss of appetite -headache -muscle aches -nausea This list may not describe all possible side effects. Call your doctor for medical advice about side effects. You may report side effects to FDA at 1-800-FDA-1088. Where should I keep my medicine? This drug is given in a hospital or clinic and will not be stored at home. NOTE: This sheet is a summary. It may not cover all possible information. If you have questions about this medicine, talk to your doctor, pharmacist, or health care provider.  2014, [employer] Standard. (2009-06-19 13:43:15) Ondansetron injection What is this medicine? ONDANSETRON (on DAN se tron) is used to treat nausea and vomiting caused by chemotherapy. It is also  used to prevent or treat nausea and vomiting after surgery. This medicine may  be used for other purposes; ask your health care provider or pharmacist if you have questions. COMMON BRAND NAME(S): Zofran What should I tell my health care provider before I take this medicine? They need to know if you have any of these conditions: -heart disease -history of irregular heartbeat -liver disease -low levels of magnesium or potassium in the blood -an unusual or allergic reaction to ondansetron, granisetron, other medicines, foods, dyes, or preservatives -pregnant or trying to get pregnant -breast-feeding How should I use this medicine? This medicine is for infusion into a vein. It is given by a health care professional in a hospital or clinic setting. Talk to your pediatrician regarding the use of this medicine in children. Special care may be needed. Overdosage: If you think you have taken too much of this medicine contact a poison control center or emergency room at once. NOTE: This medicine is only for you. Do not share this medicine with others. What if I miss a dose? This does not apply. What may interact with this medicine? Do not take this medicine with any of the following medications: -apomorphine -cisapride -dofetilide -dronedarone -pimozide -thioridazine -ziprasidone  This medicine may also interact with the following medications: -carbamazepine -phenytoin -rifampicin -tramadol -other medicines that prolong the QT interval (cause an abnormal heart rhythm) This list may not describe all possible interactions. Give your health care provider a list of all the medicines, herbs, non-prescription drugs, or dietary supplements you use. Also tell them if you smoke, drink alcohol, or use illegal drugs. Some items may interact with your medicine. What should I watch for while using this medicine? Your condition will be monitored carefully while you are receiving this medicine. What side effects may I notice from receiving this medicine? Side effects that you  should report to your doctor or health care professional as soon as possible: -allergic reactions like skin rash, itching or hives, swelling of the face, lips, or tongue -breathing problems -dizziness -fast or irregular heartbeat -feeling faint or lightheaded, falls -fever and chills -swelling of the hands and feet -tightness in the chest Side effects that usually do not require medical attention (report to your doctor or health care professional if they continue or are bothersome): -constipation or diarrhea -headache This list may not describe all possible side effects. Call your doctor for medical advice about side effects. You may report side effects to FDA at 1-800-FDA-1088. Where should I keep my medicine? This drug is given in a hospital or clinic and will not be stored at home. NOTE: This sheet is a summary. It may not cover all possible information. If you have questions about this medicine, talk to your doctor, pharmacist, or health care provider.  2014, [employer]/Gold Standard. (2012-08-16 15:47:34) Dexamethasone injection What is this medicine? DEXAMETHASONE (dex a METH a sone) is a corticosteroid. It is used to treat inflammation of the skin, joints, lungs, and other organs. Common conditions treated include asthma, allergies, and arthritis. It is also used for other conditions, like blood disorders and diseases of the adrenal glands. This medicine may be used for other purposes; ask your health care provider or pharmacist if you have questions. COMMON BRAND NAME(S): Decadron, Solurex What should I tell my health care provider before I take this medicine? They need to know if you have any of these conditions: -blood clotting problems -Cushing's syndrome -diabetes -glaucoma -heart problems or disease -high blood pressure -infection like  herpes, measles, tuberculosis, or chickenpox -kidney disease -liver disease -mental problems -myasthenia gravis -osteoporosis -previous  heart attack -seizures -stomach, ulcer or intestine disease including colitis and diverticulitis -thyroid problem -an unusual or allergic reaction to dexamethasone, corticosteroids, other medicines, lactose, foods, dyes, or preservatives -pregnant or trying to get pregnant -breast-feeding How should I use this medicine? This medicine is for injection into a muscle, joint, lesion, soft tissue, or vein. It is given by a health care professional in a hospital or clinic setting. Talk to your pediatrician regarding the use of this medicine in children. Special care may be needed. Overdosage: If you think you have taken too much of this medicine contact a poison control center or emergency room at once. NOTE: This medicine is only for you. Do not share this medicine with others. What if I miss a dose? This may not apply. If you are having a series of injections over a prolonged period, try not to miss an appointment. Call your doctor or health care professional to reschedule if you are unable to keep an appointment. What may interact with this medicine? Do not take this medicine with any of the following medications: -mifepristone, RU-486 -vaccines This medicine may also interact with the following medications: -amphotericin B -antibiotics like clarithromycin, erythromycin, and troleandomycin -aspirin and aspirin-like drugs -barbiturates like phenobarbital -carbamazepine -cholestyramine -cholinesterase inhibitors like donepezil, galantamine, rivastigmine, and tacrine -cyclosporine -digoxin -diuretics -ephedrine -female hormones, like estrogens or progestins and birth control pills -indinavir -isoniazid -ketoconazole -medicines for diabetes -medicines that improve muscle tone or strength for conditions like myasthenia gravis -NSAIDs, medicines for pain and inflammation, like ibuprofen or naproxen -phenytoin -rifampin -thalidomide -warfarin This list may not describe all possible  interactions. Give your health care provider a list of all the medicines, herbs, non-prescription drugs, or dietary supplements you use. Also tell them if you smoke, drink alcohol, or use illegal drugs. Some items may interact with your medicine. What should I watch for while using this medicine? Your condition will be monitored carefully while you are receiving this medicine. If you are taking this medicine for a long time, carry an identification card with your name and address, the type and dose of your medicine, and your doctor's name and address. This medicine may increase your risk of getting an infection. Stay away from people who are sick. Tell your doctor or health care professional if you are around anyone with measles or chickenpox. Talk to your health care provider before you get any vaccines that you take this medicine. If you are going to have surgery, tell your doctor or health care professional that you have taken this medicine within the last twelve months. Ask your doctor or health care professional about your diet. You may need to lower the amount of salt you eat. The medicine can increase your blood sugar. If you are a diabetic check with your doctor if you need help adjusting the dose of your diabetic medicine. What side effects may I notice from receiving this medicine? Side effects that you should report to your doctor or health care professional as soon as possible: -allergic reactions like skin rash, itching or hives, swelling of the face, lips, or tongue -black or tarry stools -change in the amount of urine -changes in vision -confusion, excitement, restlessness, a false sense of well-being -fever, sore throat, sneezing, cough, or other signs of infection, wounds that will not heal -hallucinations -increased thirst -mental depression, mood swings, mistaken feelings of self importance or of being mistreated -  pain in hips, back, ribs, arms, shoulders, or legs -pain,  redness, or irritation at the injection site -redness, blistering, peeling or loosening of the skin, including inside the mouth -rounding out of face -swelling of feet or lower legs -unusual bleeding or bruising -unusual tired or weak -wounds that do not heal Side effects that usually do not require medical attention (report to your doctor or health care professional if they continue or are bothersome): -diarrhea or constipation -change in taste -headache -nausea, vomiting -skin problems, acne, thin and shiny skin -touble sleeping -unusual growth of hair on the face or body -weight gain This list may not describe all possible side effects. Call your doctor for medical advice about side effects. You may report side effects to FDA at 1-800-FDA-1088. Where should I keep my medicine? This drug is given in a hospital or clinic and will not be stored at home. NOTE: This sheet is a summary. It may not cover all possible information. If you have questions about this medicine, talk to your doctor, pharmacist, or health care provider.  2014, Else[employer]/Gold Standard. (2007-12-06 14:04:12) Metoclopramide tablets What is this medicine? METOCLOPRAMIDE (met oh kloe PRA mide) is used to treat the symptoms of gastroesophageal reflux disease (GERD) like heartburn. It is also used to treat people with slow emptying of the stomach and intestinal tract. This medicine may be used for other purposes; ask your health care provider or pharmacist if you have questions. COMMON BRAND NAME(S): Reglan What should I tell my health care provider before I take this medicine? They need to know if you have any of these conditions: -breast cancer -depression -diabetes -heart failure -high blood pressure -kidney disease -liver disease -Parkinson's disease or a movement disorder -pheochromocytoma -seizures -stomach obstruction, bleeding, or perforation -an unusual or allergic reaction to metoclopramide,  procainamide, sulfites, other medicines, foods, dyes, or preservatives -pregnant or trying to get pregnant -breast-feeding How should I use this medicine? Take this medicine by mouth with a glass of water. Follow the directions on the prescription label. Take this medicine on an empty stomach, about 30 minutes before eating. Take your doses at regular intervals. Do not take your medicine more often than directed. Do not stop taking except on the advice of your doctor or health care professional. A special MedGuide will be given to you by the pharmacist with each prescription and refill. Be sure to read this information carefully each time. Talk to your pediatrician regarding the use of this medicine in children. Special care may be needed. Overdosage: If you think you have taken too much of this medicine contact a poison control center or emergency room at once. NOTE: This medicine is only for you. Do not share this medicine with others. What if I miss a dose? If you miss a dose, take it as soon as you can. If it is almost time for your next dose, take only that dose. Do not take double or extra doses. What may interact with this medicine? -acetaminophen -cyclosporine -digoxin -medicines for blood pressure -medicines for diabetes, including insulin -medicines for hay fever and other allergies -medicines for depression, especially an Monoamine Oxidase Inhibitor (MAOI) -medicines for Parkinson's disease, like levodopa -medicines for sleep or for pain -tetracycline This list may not describe all possible interactions. Give your health care provider a list of all the medicines, herbs, non-prescription drugs, or dietary supplements you use. Also tell them if you smoke, drink alcohol, or use illegal drugs. Some items may interact with your medicine.  What should I watch for while using this medicine? It may take a few weeks for your stomach condition to start to get better. However, do not take this  medicine for longer than 12 weeks. The longer you take this medicine, and the more you take it, the greater your chances are of developing serious side effects. If you are an elderly patient, a female patient, or you have diabetes, you may be at an increased risk for side effects from this medicine. Contact your doctor immediately if you start having movements you cannot control such as lip smacking, rapid movements of the tongue, involuntary or uncontrollable movements of the eyes, head, arms and legs, or muscle twitches and spasms. Patients and their families should watch out for worsening depression or thoughts of suicide. Also watch out for any sudden or severe changes in feelings such as feeling anxious, agitated, panicky, irritable, hostile, aggressive, impulsive, severely restless, overly excited and hyperactive, or not being able to sleep. If this happens, especially at the beginning of treatment or after a change in dose, call your doctor. Do not treat yourself for high fever. Ask your doctor or health care professional for advice. You may get drowsy or dizzy. Do not drive, use machinery, or do anything that needs mental alertness until you know how this drug affects you. Do not stand or sit up quickly, especially if you are an older patient. This reduces the risk of dizzy or fainting spells. Alcohol can make you more drowsy and dizzy. Avoid alcoholic drinks. What side effects may I notice from receiving this medicine? Side effects that you should report to your doctor or health care professional as soon as possible: -allergic reactions like skin rash, itching or hives, swelling of the face, lips, or tongue -abnormal production of milk in females -breast enlargement in both males and females -change in the way you walk -difficulty moving, speaking or swallowing -drooling, lip smacking, or rapid movements of the tongue -excessive sweating -fever -involuntary or uncontrollable movements of the  eyes, head, arms and legs -irregular heartbeat or palpitations -muscle twitches and spasms -unusually weak or tired Side effects that usually do not require medical attention (report to your doctor or health care professional if they continue or are bothersome): -change in sex drive or performance -depressed mood -diarrhea -difficulty sleeping -headache -menstrual changes -restless or nervous This list may not describe all possible side effects. Call your doctor for medical advice about side effects. You may report side effects to FDA at 1-800-FDA-1088. Where should I keep my medicine? Keep out of the reach of children. Store at room temperature between 20 and 25 degrees C (68 and 77 degrees F). Protect from light. Keep container tightly closed. Throw away any unused medicine after the expiration date. NOTE: This sheet is a summary. It may not cover all possible information. If you have questions about this medicine, talk to your doctor, pharmacist, or health care provider.  2014, [employer]/Gold Standard. (2011-12-13 13:04:38) Prochlorperazine tablets What is this medicine? PROCHLORPERAZINE (proe klor PER a zeen) helps to control severe nausea and vomiting. This medicine is also used to treat schizophrenia. It can also help patients who experience anxiety that is not due to psychological illness. This medicine may be used for other purposes; ask your health care provider or pharmacist if you have questions. COMMON BRAND NAME(S): Compazine What should I tell my health care provider before I take this medicine? They need to know if you have any of these conditions: -blood  disorders or disease -dementia -liver disease or jaundice -Parkinson's disease -uncontrollable movement disorder -an unusual or allergic reaction to prochlorperazine, other medicines, foods, dyes, or preservatives -pregnant or trying to get pregnant -breast-feeding How should I use this medicine? Take this medicine  by mouth with a glass of water. Follow the directions on the prescription label. Take your doses at regular intervals. Do not take your medicine more often than directed. Do not stop taking this medicine suddenly. This can cause nausea, vomiting, and dizziness. Ask your doctor or health care professional for advice. Talk to your pediatrician regarding the use of this medicine in children. Special care may be needed. While this drug may be prescribed for children as young as 2 years for selected conditions, precautions do apply. Overdosage: If you think you have taken too much of this medicine contact a poison control center or emergency room at once. NOTE: This medicine is only for you. Do not share this medicine with others. What if I miss a dose? If you miss a dose, take it as soon as you can. If it is almost time for your next dose, take only that dose. Do not take double or extra doses. What may interact with this medicine? Do not take this medicine with any of the following medications: -amoxapine -antidepressants like citalopram, escitalopram, fluoxetine, paroxetine, and sertraline -deferoxamine -dofetilide -maprotiline -tricyclic antidepressants like amitriptyline, clomipramine, imipramine, nortiptyline and others This medicine may also interact with the following medications: -lithium -medicines for pain -phenytoin -propranolol -warfarin This list may not describe all possible interactions. Give your health care provider a list of all the medicines, herbs, non-prescription drugs, or dietary supplements you use. Also tell them if you smoke, drink alcohol, or use illegal drugs. Some items may interact with your medicine. What should I watch for while using this medicine? Visit your doctor or health care professional for regular checks on your progress. You may get drowsy or dizzy. Do not drive, use machinery, or do anything that needs mental alertness until you know how this medicine  affects you. Do not stand or sit up quickly, especially if you are an older patient. This reduces the risk of dizzy or fainting spells. Alcohol may interfere with the effect of this medicine. Avoid alcoholic drinks. This medicine can reduce the response of your body to heat or cold. Dress warm in cold weather and stay hydrated in hot weather. If possible, avoid extreme temperatures like saunas, hot tubs, very hot or cold showers, or activities that can cause dehydration such as vigorous exercise. This medicine can make you more sensitive to the sun. Keep out of the sun. If you cannot avoid being in the sun, wear protective clothing and use sunscreen. Do not use sun lamps or tanning beds/booths. Your mouth may get dry. Chewing sugarless gum or sucking hard candy, and drinking plenty of water may help. Contact your doctor if the problem does not go away or is severe. What side effects may I notice from receiving this medicine? Side effects that you should report to your doctor or health care professional as soon as possible: -blurred vision -breast enlargement in men or women -breast milk in women who are not breast-feeding -chest pain, fast or irregular heartbeat -confusion, restlessness -dark yellow or brown urine -difficulty breathing or swallowing -dizziness or fainting spells -drooling, shaking, movement difficulty (shuffling walk) or rigidity -fever, chills, sore throat -involuntary or uncontrollable movements of the eyes, mouth, head, arms, and legs -seizures -stomach area pain -unusually weak  or tired -unusual bleeding or bruising -yellowing of skin or eyes Side effects that usually do not require medical attention (report to your doctor or health care professional if they continue or are bothersome): -difficulty passing urine -difficulty sleeping -headache -sexual dysfunction -skin rash, or itching This list may not describe all possible side effects. Call your doctor for medical  advice about side effects. You may report side effects to FDA at 1-800-FDA-1088. Where should I keep my medicine? Keep out of the reach of children. Store at room temperature between 15 and 30 degrees C (59 and 86 degrees F). Protect from light. Throw away any unused medicine after the expiration date. NOTE: This sheet is a summary. It may not cover all possible information. If you have questions about this medicine, talk to your doctor, pharmacist, or health care provider.  2014, [employer] Standard. (2012-01-03 16:59:39) Dexamethasone tablets What is this medicine? DEXAMETHASONE (dex a METH a sone) is a corticosteroid. It is commonly used to treat inflammation of the skin, joints, lungs, and other organs. Common conditions treated include asthma, allergies, and arthritis. It is also used for other conditions, such as blood disorders and diseases of the adrenal glands. This medicine may be used for other purposes; ask your health care provider or pharmacist if you have questions. COMMON BRAND NAME(S): Decadron, DexPak Dorothea Ogle, DexPak TaperPak, Zema-Pak What should I tell my health care provider before I take this medicine? They need to know if you have any of these conditions: -Cushing's syndrome -diabetes -glaucoma -heart problems or disease -high blood pressure -infection like herpes, measles, tuberculosis, or chickenpox -kidney disease -liver disease -mental problems -myasthenia gravis -osteoporosis -previous heart attack -seizures -stomach, ulcer or intestine disease including colitis and diverticulitis -thyroid problem -an unusual or allergic reaction to dexamethasone, corticosteroids, other medicines, lactose, foods, dyes, or preservatives -pregnant or trying to get pregnant -breast-feeding How should I use this medicine? Take this medicine by mouth with a drink of water. Follow the directions on the prescription label. Take it with food or milk to avoid stomach upset.  If you are taking this medicine once a day, take it in the morning. Do not take more medicine than you are told to take. Do not suddenly stop taking your medicine because you may develop a severe reaction. Your doctor will tell you how much medicine to take. If your doctor wants you to stop the medicine, the dose may be slowly lowered over time to avoid any side effects. Talk to your pediatrician regarding the use of this medicine in children. Special care may be needed. Patients over 74 years old may have a stronger reaction and need a smaller dose. Overdosage: If you think you have taken too much of this medicine contact a poison control center or emergency room at once. NOTE: This medicine is only for you. Do not share this medicine with others. What if I miss a dose? If you miss a dose, take it as soon as you can. If it is almost time for your next dose, talk to your doctor or health care professional. You may need to miss a dose or take an extra dose. Do not take double or extra doses without advice. What may interact with this medicine? Do not take this medicine with any of the following medications: -mifepristone, RU-486 -vaccines This medicine may also interact with the following medications: -amphotericin B -antibiotics like clarithromycin, erythromycin, and troleandomycin -aspirin and aspirin-like drugs -barbiturates like phenobarbital -carbamazepine -cholestyramine -cholinesterase inhibitors like donepezil,  galantamine, rivastigmine, and tacrine -cyclosporine -digoxin -diuretics -ephedrine -female hormones, like estrogens or progestins and birth control pills -indinavir -isoniazid -ketoconazole -medicines for diabetes -medicines that improve muscle tone or strength for conditions like myasthenia gravis -NSAIDs, medicines for pain and inflammation, like ibuprofen or naproxen -phenytoin -rifampin -thalidomide -warfarin This list may not describe all possible interactions.  Give your health care provider a list of all the medicines, herbs, non-prescription drugs, or dietary supplements you use. Also tell them if you smoke, drink alcohol, or use illegal drugs. Some items may interact with your medicine. What should I watch for while using this medicine? Visit your doctor or health care professional for regular checks on your progress. If you are taking this medicine over a prolonged period, carry an identification card with your name and address, the type and dose of your medicine, and your doctor's name and address. This medicine may increase your risk of getting an infection. Stay away from people who are sick. Tell your doctor or health care professional if you are around anyone with measles or chickenpox. If you are going to have surgery, tell your doctor or health care professional that you have taken this medicine within the last twelve months. Ask your doctor or health care professional about your diet. You may need to lower the amount of salt you eat. The medicine can increase your blood sugar. If you are a diabetic check with your doctor if you need help adjusting the dose of your diabetic medicine. What side effects may I notice from receiving this medicine? Side effects that you should report to your doctor or health care professional as soon as possible: -allergic reactions like skin rash, itching or hives, swelling of the face, lips, or tongue -changes in vision -fever, sore throat, sneezing, cough, or other signs of infection, wounds that will not heal -increased thirst -mental depression, mood swings, mistaken feelings of self importance or of being mistreated -pain in hips, back, ribs, arms, shoulders, or legs -redness, blistering, peeling or loosening of the skin, including inside the mouth -trouble passing urine or change in the amount of urine -swelling of feet or lower legs -unusual bleeding or bruising Side effects that usually do not require  medical attention (report to your doctor or health care professional if they continue or are bothersome): -headache -nausea, vomiting -skin problems, acne, thin and shiny skin -weight gain This list may not describe all possible side effects. Call your doctor for medical advice about side effects. You may report side effects to FDA at 1-800-FDA-1088. Where should I keep my medicine? Keep out of the reach of children. Store at room temperature between 20 and 25 degrees C (68 and 77 degrees F). Protect from light. Throw away any unused medicine after the expiration date. NOTE: This sheet is a summary. It may not cover all possible information. If you have questions about this medicine, talk to your doctor, pharmacist, or health care provider.  2014, Elsevier/Gold Standard. (2007-12-06 14:02:13) Lidocaine; Prilocaine cream What is this medicine? LIDOCAINE; PRILOCAINE (LYE doe kane; PRIL oh kane) is a topical anesthetic that causes loss of feeling in the skin and surrounding tissues. It is used to numb the skin before procedures or injections. This medicine may be used for other purposes; ask your health care provider or pharmacist if you have questions. COMMON BRAND NAME(S): EMLA What should I tell my health care provider before I take this medicine? They need to know if you have any of these conditions: -glucose-6-phosphate  deficiencies -heart disease -kidney or liver disease -methemoglobinemia -an unusual or allergic reaction to lidocaine, prilocaine, other medicines, foods, dyes, or preservatives -pregnant or trying to get pregnant -breast-feeding How should I use this medicine? This medicine is for external use only on the skin. Do not take by mouth. Follow the directions on the prescription label. Wash hands before and after use. Do not use more or leave in contact with the skin longer than directed. Do not apply to eyes or open wounds. It can cause irritation and blurred or temporary  loss of vision. If this medicine comes in contact with your eyes, immediately rinse the eye with water. Do not touch or rub the eye. Contact your health care provider right away. Talk to your pediatrician regarding the use of this medicine in children. While this medicine may be prescribed for children for selected conditions, precautions do apply. Overdosage: If you think you have taken too much of this medicine contact a poison control center or emergency room at once. NOTE: This medicine is only for you. Do not share this medicine with others. What if I miss a dose? This medicine is usually only applied once prior to each procedure. It must be in contact with the skin for a period of time for it to work. If you applied this medicine later than directed, tell your health care professional before starting the procedure. What may interact with this medicine? -acetaminophen -chloroquine -dapsone -medicines to control heart rhythm -nitrates like nitroglycerin and nitroprusside -other ointments, creams, or sprays that may contain anesthetic medicine -phenobarbital -phenytoin -quinine -sulfonamides like sulfacetamide, sulfamethoxazole, sulfasalazine and others This list may not describe all possible interactions. Give your health care provider a list of all the medicines, herbs, non-prescription drugs, or dietary supplements you use. Also tell them if you smoke, drink alcohol, or use illegal drugs. Some items may interact with your medicine. What should I watch for while using this medicine? Be careful to avoid injury to the treated area while it is numb and you are not aware of pain. Avoid scratching, rubbing, or exposing the treated area to hot or cold temperatures until complete sensation has returned. The numb feeling will wear off a few hours after applying the cream. What side effects may I notice from receiving this medicine? Side effects that you should report to your doctor or health care  professional as soon as possible: -blurred vision -chest pain -difficulty breathing -dizziness -drowsiness -fast or irregular heartbeat -skin rash or itching -swelling of your throat, lips, or face -trembling Side effects that usually do not require medical attention (report to your doctor or health care professional if they continue or are bothersome): -changes in ability to feel hot or cold -redness and swelling at the application site This list may not describe all possible side effects. Call your doctor for medical advice about side effects. You may report side effects to FDA at 1-800-FDA-1088. Where should I keep my medicine? Keep out of reach of children. Store at room temperature between 15 and 30 degrees C (59 and 86 degrees F). Keep container tightly closed. Throw away any unused medicine after the expiration date. NOTE: This sheet is a summary. It may not cover all possible information. If you have questions about this medicine, talk to your doctor, pharmacist, or health care provider.  2014, Elsevier/Gold Standard. (2008-02-18 17:14:35)

## 2013-08-13 ENCOUNTER — Encounter (HOSPITAL_COMMUNITY): Payer: Self-pay | Admitting: Oncology

## 2013-08-13 ENCOUNTER — Encounter (HOSPITAL_COMMUNITY)
Admission: RE | Admit: 2013-08-13 | Discharge: 2013-08-13 | Disposition: A | Discharge status: Home / Self Care | Payer: BC Managed Care – PPO | Source: Ambulatory Visit | Attending: Hematology and Oncology | Admitting: Hematology and Oncology

## 2013-08-13 ENCOUNTER — Encounter (HOSPITAL_BASED_OUTPATIENT_CLINIC_OR_DEPARTMENT_OTHER): Payer: BC Managed Care – PPO

## 2013-08-13 ENCOUNTER — Encounter (HOSPITAL_COMMUNITY): Payer: Self-pay

## 2013-08-13 DIAGNOSIS — C773 Secondary and unspecified malignant neoplasm of axilla and upper limb lymph nodes: Secondary | ICD-10-CM

## 2013-08-13 DIAGNOSIS — C50912 Malignant neoplasm of unspecified site of left female breast: Secondary | ICD-10-CM

## 2013-08-13 DIAGNOSIS — C50919 Malignant neoplasm of unspecified site of unspecified female breast: Secondary | ICD-10-CM | POA: Insufficient documentation

## 2013-08-13 MED ORDER — HEPARIN SOD (PORK) LOCK FLUSH 100 UNIT/ML IV SOLN
INTRAVENOUS | Status: AC
  Filled 2013-08-13: qty 5

## 2013-08-13 MED ORDER — TECHNETIUM TC 99M-LABELED RED BLOOD CELLS IV KIT
25.0000 | PACK | Freq: Once | INTRAVENOUS | Status: AC | PRN
  Administered 2013-08-13: 25 via INTRAVENOUS

## 2013-08-13 NOTE — Progress Notes (Signed)
Chemo teaching done and consent signed for Carboplatin, Taxotere, & Herceptin. Med/chemo calendar given to patient. Meds called in yesterday 08/12/13 to drug store.

## 2013-08-15 ENCOUNTER — Encounter (HOSPITAL_COMMUNITY): Payer: Self-pay

## 2013-08-15 ENCOUNTER — Encounter (HOSPITAL_COMMUNITY)
Admission: RE | Admit: 2013-08-15 | Discharge: 2013-08-15 | Disposition: A | Discharge status: Home / Self Care | Payer: BC Managed Care – PPO | Source: Ambulatory Visit | Attending: General Surgery | Admitting: General Surgery

## 2013-08-15 HISTORY — DX: Malignant neoplasm of unspecified site of left female breast: C50.912

## 2013-08-15 LAB — POTASSIUM: Potassium: 3.6 mEq/L (ref 3.5–5.1)

## 2013-08-15 NOTE — Patient Instructions (Addendum)
Heather Vaughan  08/15/2013   Your procedure is scheduled on:  08/16/2013  Report to [facility] at 08:55 AM.  Call this number if you have problems the morning of surgery: [phone]   Remember:   Do not eat food or drink liquids after midnight.   Take these medicines the morning of surgery with A SIP OF WATER:  Cymbalta, Losartan-HCTZ and Nexium.   Do not wear jewelry, make-up or nail polish.  Do not wear lotions, powders, or perfumes.   Do not shave 48 hours prior to surgery. Men may shave face and neck.  Do not bring valuables to the hospital.  [facility] is not responsible for any belongings or valuables.               Contacts, dentures or bridgework may not be worn into surgery.  Leave suitcase in the car. After surgery it may be brought to your room.  For patients admitted to the hospital, discharge time is determined by your treatment team.               Patients discharged the day of surgery will not be allowed to drive home.   Special Instructions: Shower using CHG 2 nights before surgery and the night before surgery.  If you shower the day of surgery use CHG.  Use special wash - you have one bottle of CHG for all showers.  You should use approximately 1/3 of the bottle for each shower.   Please read over the following fact sheets that you were given: Pain Booklet, Surgical Site Infection Prevention, Anesthesia Post-op Instructions and Care and Recovery After Surgery    PATIENT INSTRUCTIONS POST-ANESTHESIA  IMMEDIATELY FOLLOWING SURGERY:  Do not drive or operate machinery for the first twenty four hours after surgery.  Do not make any important decisions for twenty four hours after surgery or while taking narcotic pain medications or sedatives.  If you develop intractable nausea and vomiting or a severe headache please notify your doctor immediately.  FOLLOW-UP:  Please make an appointment with your surgeon as instructed. You do not need to follow up with anesthesia  unless specifically instructed to do so.  WOUND CARE INSTRUCTIONS (if applicable):  Keep a dry clean dressing on the anesthesia/puncture wound site if there is drainage.  Once the wound has quit draining you may leave it open to air.  Generally you should leave the bandage intact for twenty four hours unless there is drainage.  If the epidural site drains for more than 36-48 hours please call the anesthesia department.  QUESTIONS?:  Please feel free to call your physician or the hospital operator if you have any questions, and they will be happy to assist you.

## 2013-08-16 ENCOUNTER — Ambulatory Visit (HOSPITAL_COMMUNITY): Payer: BC Managed Care – PPO | Admitting: Anesthesiology

## 2013-08-16 ENCOUNTER — Ambulatory Visit (HOSPITAL_COMMUNITY)
Admission: RE | Admit: 2013-08-16 | Discharge: 2013-08-16 | Disposition: A | Discharge status: Home / Self Care | Payer: BC Managed Care – PPO | Source: Ambulatory Visit | Attending: General Surgery | Admitting: General Surgery

## 2013-08-16 ENCOUNTER — Ambulatory Visit (HOSPITAL_COMMUNITY): Payer: BC Managed Care – PPO

## 2013-08-16 ENCOUNTER — Encounter (HOSPITAL_COMMUNITY): Payer: BC Managed Care – PPO | Admitting: Anesthesiology

## 2013-08-16 ENCOUNTER — Encounter (HOSPITAL_COMMUNITY)
Admission: RE | Disposition: A | Discharge status: Home / Self Care | Payer: Self-pay | Source: Ambulatory Visit | Attending: General Surgery

## 2013-08-16 ENCOUNTER — Encounter (HOSPITAL_COMMUNITY): Payer: Self-pay | Admitting: *Deleted

## 2013-08-16 DIAGNOSIS — K219 Gastro-esophageal reflux disease without esophagitis: Secondary | ICD-10-CM

## 2013-08-16 DIAGNOSIS — C50912 Malignant neoplasm of unspecified site of left female breast: Secondary | ICD-10-CM

## 2013-08-16 DIAGNOSIS — I10 Essential (primary) hypertension: Secondary | ICD-10-CM

## 2013-08-16 DIAGNOSIS — K2270 Barrett's esophagus without dysplasia: Secondary | ICD-10-CM

## 2013-08-16 DIAGNOSIS — C50919 Malignant neoplasm of unspecified site of unspecified female breast: Secondary | ICD-10-CM | POA: Insufficient documentation

## 2013-08-16 HISTORY — PX: PORTACATH PLACEMENT: SHX2246

## 2013-08-16 SURGERY — INSERTION, TUNNELED CENTRAL VENOUS DEVICE, WITH PORT
Anesthesia: Monitor Anesthesia Care | Laterality: Right

## 2013-08-16 MED ORDER — BACITRACIN-NEOMYCIN-POLYMYXIN 400-5-5000 EX OINT
TOPICAL_OINTMENT | CUTANEOUS | Status: DC | PRN
  Administered 2013-08-16: 1 via TOPICAL

## 2013-08-16 MED ORDER — CEFAZOLIN SODIUM 1-5 GM-% IV SOLN
INTRAVENOUS | Status: AC
  Filled 2013-08-16: qty 50

## 2013-08-16 MED ORDER — FENTANYL CITRATE 0.05 MG/ML IJ SOLN
25.0000 ug | INTRAMUSCULAR | Status: AC
  Administered 2013-08-16 (×2): 25 ug via INTRAVENOUS

## 2013-08-16 MED ORDER — MIDAZOLAM HCL 2 MG/2ML IJ SOLN
INTRAMUSCULAR | Status: AC
  Filled 2013-08-16: qty 2

## 2013-08-16 MED ORDER — CEFAZOLIN SODIUM-DEXTROSE 2-3 GM-% IV SOLR
INTRAVENOUS | Status: AC
  Filled 2013-08-16: qty 50

## 2013-08-16 MED ORDER — LIDOCAINE HCL (PF) 1 % IJ SOLN
INTRAMUSCULAR | Status: AC
  Filled 2013-08-16: qty 30

## 2013-08-16 MED ORDER — ONDANSETRON HCL 4 MG/2ML IJ SOLN
INTRAMUSCULAR | Status: AC
  Filled 2013-08-16: qty 2

## 2013-08-16 MED ORDER — MIDAZOLAM HCL 5 MG/5ML IJ SOLN
INTRAMUSCULAR | Status: DC | PRN
  Administered 2013-08-16: 0.5 mg via INTRAVENOUS
  Administered 2013-08-16: 1 mg via INTRAVENOUS
  Administered 2013-08-16: 0.5 mg via INTRAVENOUS

## 2013-08-16 MED ORDER — LIDOCAINE HCL (PF) 1 % IJ SOLN
INTRAMUSCULAR | Status: DC | PRN
  Administered 2013-08-16: 2.5 mL
  Administered 2013-08-16: 4 mL

## 2013-08-16 MED ORDER — CEFAZOLIN SODIUM 1-5 GM-% IV SOLN
1.0000 g | Freq: Once | INTRAVENOUS | Status: AC
  Administered 2013-08-16: 1 g via INTRAVENOUS

## 2013-08-16 MED ORDER — PROPOFOL 10 MG/ML IV BOLUS
INTRAVENOUS | Status: AC
  Filled 2013-08-16: qty 20

## 2013-08-16 MED ORDER — HEPARIN SODIUM (PORCINE) 1000 UNIT/ML IJ SOLN
INTRAMUSCULAR | Status: AC
  Filled 2013-08-16: qty 1

## 2013-08-16 MED ORDER — LIDOCAINE HCL (PF) 1 % IJ SOLN
INTRAMUSCULAR | Status: AC
  Filled 2013-08-16: qty 5

## 2013-08-16 MED ORDER — PROPOFOL INFUSION 10 MG/ML OPTIME
INTRAVENOUS | Status: DC | PRN
  Administered 2013-08-16: 25 ug/kg/min via INTRAVENOUS

## 2013-08-16 MED ORDER — ONDANSETRON HCL 4 MG/2ML IJ SOLN
4.0000 mg | Freq: Once | INTRAMUSCULAR | Status: AC
  Administered 2013-08-16: 4 mg via INTRAVENOUS

## 2013-08-16 MED ORDER — HEPARIN SOD (PORK) LOCK FLUSH 100 UNIT/ML IV SOLN
INTRAVENOUS | Status: DC | PRN
  Administered 2013-08-16 (×2): 500 [IU] via INTRAVENOUS

## 2013-08-16 MED ORDER — BACITRACIN-NEOMYCIN-POLYMYXIN 400-5-5000 EX OINT
TOPICAL_OINTMENT | CUTANEOUS | Status: AC
  Filled 2013-08-16: qty 1

## 2013-08-16 MED ORDER — FENTANYL CITRATE 0.05 MG/ML IJ SOLN: 25.0000 ug | INTRAMUSCULAR | Status: DC | PRN

## 2013-08-16 MED ORDER — LACTATED RINGERS IV SOLN
INTRAVENOUS | Status: DC
  Administered 2013-08-16: 10:00:00 via INTRAVENOUS

## 2013-08-16 MED ORDER — HEPARIN SOD (PORK) LOCK FLUSH 100 UNIT/ML IV SOLN
INTRAVENOUS | Status: AC
  Filled 2013-08-16: qty 5

## 2013-08-16 MED ORDER — LIDOCAINE HCL (CARDIAC) 10 MG/ML IV SOLN
INTRAVENOUS | Status: DC | PRN
  Administered 2013-08-16: 30 mg via INTRAVENOUS
  Administered 2013-08-16: 10 mg via INTRAVENOUS

## 2013-08-16 MED ORDER — HEPARIN (PORCINE) IN NACL 2-0.9 UNIT/ML-% IJ SOLN
INTRAMUSCULAR | Status: DC | PRN
  Administered 2013-08-16: 500 mL via INTRAVENOUS

## 2013-08-16 MED ORDER — FENTANYL CITRATE 0.05 MG/ML IJ SOLN
INTRAMUSCULAR | Status: DC | PRN
  Administered 2013-08-16 (×2): 25 ug via INTRAVENOUS

## 2013-08-16 MED ORDER — ONDANSETRON HCL 4 MG/2ML IJ SOLN: 4.0000 mg | Freq: Once | INTRAMUSCULAR | Status: DC | PRN

## 2013-08-16 MED ORDER — FENTANYL CITRATE 0.05 MG/ML IJ SOLN
INTRAMUSCULAR | Status: AC
  Filled 2013-08-16: qty 2

## 2013-08-16 MED ORDER — MIDAZOLAM HCL 2 MG/2ML IJ SOLN
1.0000 mg | INTRAMUSCULAR | Status: DC | PRN
  Administered 2013-08-16 (×2): 2 mg via INTRAVENOUS

## 2013-08-16 SURGICAL SUPPLY — 35 items
BAG DECANTER FOR FLEXI CONT (MISCELLANEOUS) ×2 IMPLANT
BAG HAMPER (MISCELLANEOUS) ×2 IMPLANT
CLOTH BEACON ORANGE TIMEOUT ST (SAFETY) ×2 IMPLANT
COVER LIGHT HANDLE STERIS (MISCELLANEOUS) ×4 IMPLANT
DECANTER SPIKE VIAL GLASS SM (MISCELLANEOUS) ×2 IMPLANT
DRAPE C-ARM FOLDED MOBILE STRL (DRAPES) ×2 IMPLANT
DRSG TEGADERM 2-3/8X2-3/4 SM (GAUZE/BANDAGES/DRESSINGS) ×2 IMPLANT
DURAPREP 26ML APPLICATOR (WOUND CARE) ×2 IMPLANT
ELECT REM PT RETURN 9FT ADLT (ELECTROSURGICAL) ×2
ELECTRODE REM PT RTRN 9FT ADLT (ELECTROSURGICAL) ×1 IMPLANT
GLOVE SKINSENSE NS SZ7.0 (GLOVE) ×1
GLOVE SKINSENSE STRL SZ7.0 (GLOVE) ×1 IMPLANT
GOWN STRL REIN XL XLG (GOWN DISPOSABLE) ×4 IMPLANT
IV NS 500ML (IV SOLUTION) ×1
IV NS 500ML BAXH (IV SOLUTION) ×1 IMPLANT
KIT PORT POWER 8FR ISP MRI (CATHETERS) ×2 IMPLANT
KIT ROOM TURNOVER APOR (KITS) ×2 IMPLANT
MANIFOLD NEPTUNE II (INSTRUMENTS) ×2 IMPLANT
NEEDLE HYPO 18GX1.5 BLUNT FILL (NEEDLE) ×2 IMPLANT
NEEDLE HYPO 25X1 1.5 SAFETY (NEEDLE) ×2 IMPLANT
PACK MINOR (CUSTOM PROCEDURE TRAY) ×2 IMPLANT
PAD ARMBOARD 7.5X6 YLW CONV (MISCELLANEOUS) ×2 IMPLANT
SET BASIN LINEN APH (SET/KITS/TRAYS/PACK) ×2 IMPLANT
SPONGE GAUZE 2X2 8PLY STRL LF (GAUZE/BANDAGES/DRESSINGS) ×2 IMPLANT
STRIP CLOSURE SKIN 1/4X3 (GAUZE/BANDAGES/DRESSINGS) ×2 IMPLANT
SUT VIC AB 4-0 SH 27 (SUTURE) ×1
SUT VIC AB 4-0 SH 27XBRD (SUTURE) ×1 IMPLANT
SUT VIC AB 5-0 P-3 18X BRD (SUTURE) ×1 IMPLANT
SUT VIC AB 5-0 P3 18 (SUTURE) ×1
SYR 20CC LL (SYRINGE) ×2 IMPLANT
SYR 5ML LL (SYRINGE) ×4 IMPLANT
SYR BULB IRRIGATION 50ML (SYRINGE) ×2 IMPLANT
SYR CONTROL 10ML LL (SYRINGE) ×2 IMPLANT
TOWEL OR 17X26 4PK STRL BLUE (TOWEL DISPOSABLE) ×2 IMPLANT
WATER STERILE IRR 1000ML POUR (IV SOLUTION) ×4 IMPLANT

## 2013-08-16 NOTE — Brief Op Note (Signed)
08/16/2013  12:01 PM  PATIENT:  Heather Vaughan  57 y.o. female  PRE-OPERATIVE DIAGNOSIS:  invasive ductal carcinoma; placement of port for chemotherapy  POST-OPERATIVE DIAGNOSIS:  invasive ductal carcinoma; placement of port for chemotherapy  PROCEDURE:  Procedure(s): INSERTION PORT-A-CATH RIGHT SUBCLAVIAN (Right)  SURGEON:  Surgeon(s) and Role:    * Marlane Hatcher, MD - Primary  PHYSICIAN ASSISTANT:   ASSISTANTS: none   ANESTHESIA:   IV sedation  EBL:  Total I/O In: 500 [I.V.:500] Out: 50 [Blood:50]  BLOOD ADMINISTERED:none  DRAINS: none   LOCAL MEDICATIONS USED:  XYLOCAINE 1% without epi ~10cc.  SPECIMEN:  No Specimen  DISPOSITION OF SPECIMEN:  N/A  COUNTS:  YES  TOURNIQUET:  * No tourniquets in log *  DICTATION: .Other Dictation: Dictation Number OR dict. #  [id number].  PLAN OF CARE: Discharge to home after PACU  PATIENT DISPOSITION:  PACU - hemodynamically stable.   Delay start of Pharmacological VTE agent (>24hrs) due to surgical blood loss or risk of bleeding: not applicable

## 2013-08-16 NOTE — Transfer of Care (Signed)
Immediate Anesthesia Transfer of Care Note  Patient: Heather Vaughan  Procedure(s) Performed: Procedure(s) (LRB): INSERTION PORT-A-CATH RIGHT SUBCLAVIAN (Right)  Patient Location: PACU  Anesthesia Type: MAC  Level of Consciousness: awake  Airway & Oxygen Therapy: Patient Spontanous Breathing. Nasal cannula  Post-op Assessment: Report given to PACU RN, Post -op Vital signs reviewed and stable and Patient moving all extremities  Post vital signs: Reviewed and stable  Complications: No apparent anesthesia complications

## 2013-08-16 NOTE — Progress Notes (Signed)
CXR done

## 2013-08-16 NOTE — Progress Notes (Signed)
CXR normal. No pneumo per Dr Malvin Johns.

## 2013-08-16 NOTE — Anesthesia Preprocedure Evaluation (Signed)
Anesthesia Evaluation  Patient identified by MRN, date of birth, ID band Patient awake    Reviewed: Allergy & Precautions, H&P , NPO status , Patient's Chart, lab work & pertinent test results  History of Anesthesia Complications Negative for: history of anesthetic complications  Airway Mallampati: I      Dental  (+) Partial Upper and Teeth Intact   Pulmonary former smoker,    Pulmonary exam normal       Cardiovascular hypertension, Pt. on medications Rhythm:Regular Rate:Normal     Neuro/Psych PSYCHIATRIC DISORDERS Depression    GI/Hepatic GERD-  Medicated and Controlled,  Endo/Other    Renal/GU      Musculoskeletal   Abdominal   Peds  Hematology   Anesthesia Other Findings   Reproductive/Obstetrics                           Anesthesia Physical Anesthesia Plan  ASA: II  Anesthesia Plan: MAC   Post-op Pain Management:    Induction: Intravenous  Airway Management Planned: Simple Face Mask  Additional Equipment:   Intra-op Plan:   Post-operative Plan:   Informed Consent: I have reviewed the patients History and Physical, chart, labs and discussed the procedure including the risks, benefits and alternatives for the proposed anesthesia with the patient or authorized representative who has indicated his/her understanding and acceptance.     Plan Discussed with:   Anesthesia Plan Comments:         Anesthesia Quick Evaluation

## 2013-08-16 NOTE — Progress Notes (Signed)
Post OP Check  Filed Vitals:   08/16/13 1200  BP: 121/66  Pulse: 82  Temp:   Resp:   resp 20/Min, O2 Sat 95%  Follow up CXR shows cath tip in SVC and no pneumothorax.  Wound clean and dry with minimal pain.  Discharge and follow up arranged

## 2013-08-16 NOTE — Anesthesia Postprocedure Evaluation (Signed)
Anesthesia Post Note  Patient: Heather Vaughan  Procedure(s) Performed: Procedure(s) (LRB): INSERTION PORT-A-CATH RIGHT SUBCLAVIAN (Right)  Anesthesia type: MAC  Patient location: PACU  Post pain: Pain level controlled  Post assessment: Post-op Vital signs reviewed, Patient's Cardiovascular Status Stable, Respiratory Function Stable, Patent Airway, No signs of Nausea or vomiting and Pain level controlled  Last Vitals:  Filed Vitals:   08/16/13 1156  BP: 113/53  Pulse: 97  Temp: 36.9 C  Resp: 16    Post vital signs: Reviewed and stable  Level of consciousness: awake and alert   Complications: No apparent anesthesia complications

## 2013-08-16 NOTE — Anesthesia Procedure Notes (Addendum)
Procedure Name: MAC Date/Time: 08/16/2013 10:46 AM Performed by: Franco Nones Pre-anesthesia Checklist: Patient identified, Emergency Drugs available, Suction available, Timeout performed and Patient being monitored Patient Re-evaluated:Patient Re-evaluated prior to inductionOxygen Delivery Method: Nasal Cannula   Procedure Name: MAC Date/Time: 08/16/2013 11:05 AM Performed by: Franco Nones Pre-anesthesia Checklist: Patient identified, Emergency Drugs available, Suction available, Timeout performed and Patient being monitored Patient Re-evaluated:Patient Re-evaluated prior to inductionOxygen Delivery Method: Non-rebreather mask

## 2013-08-16 NOTE — Progress Notes (Signed)
Admit via OP dept. 56 yr. Old W. Female for placement of porta cath. For chemotherapy for treatment of left breast CA.  Procedures and risks addressed and informed consent obtained.  No clinical change in H&P.  S/P left modified radical mastectomy.  Labs reviewed.  All questions answered.   Filed Vitals:   08/16/13 0916  BP: 138/84  Pulse: 79  Temp: 98.7 F (37.1 C)  Resp: 23

## 2013-08-17 NOTE — Op Note (Signed)
NAMENELSIE, DOMINO              ACCOUNT NO.:  [id number]  MEDICAL RECORD NO.:  [id number]  LOCATION:  [facility]                          FACILITY:  [facility]  PHYSICIAN:  Barbaraann Barthel, M.D. DATE OF BIRTH:  23-Oct-1955  DATE OF PROCEDURE:  08/16/2013 DATE OF DISCHARGE:  08/16/2013                              OPERATIVE REPORT   DIAGNOSIS:  Carcinoma of the left breast status post left modified radical mastectomy.  PROCEDURE:  Placement of right subclavian Port-A-Cath under fluoroscopy.  WOUND CLASSIFICATION:  Clean.  SPECIMEN:  None.  NOTE:  This is a 58 year old white female, who is status post left modified radical mastectomy for a centrally located carcinoma of the left breast.  She had 1 positive lymph node and she was evaluated by the Oncology team, who requested the placement of Port-A-Cath for continuing chemotherapy treatment.  We discussed complications not limited to, but including bleeding, infection, pneumothorax, catheter embolization, and informed consent was obtained.  GROSS OPERATIVE FINDINGS:  The patient had a catheter placed under fluoroscopy.  There was a tendency for catheter to go across the innominate vein to the other side, but we were able to manipulate the guidewire so that under fluoroscopy the catheter was seen to be in the distal portion of superior vena cava.  A postoperative chest x-ray is pending for final position and to rule out pneumothorax.  TECHNIQUE:  The patient was placed in supine position.  After the adequate administration of IV sedation, her right hemithorax was prepped with an alcohol prep solution and she was draped in usual manner and area under her right clavicle was anesthetized with 1% Xylocaine without epinephrine and then with the patient Trendelenburg, the subclavian vein on the right side was catheterized with an 18-gauge needle and a guidewire was placed through this.  We were then able to place the silastic catheter over  the guidewire and under direct vision placed the catheter within the superior vena cava after manipulating it as mentioned above.  We then anesthetized the subcutaneous pocket and then connected the infusion device, which had been previously flushed and all bubbles removed from it and the infusion device was then placed in the subcutaneous pocket.  We then irrigated the wound and closed the subcutaneous layer with 4-0 Polysorb and the skin with a running subcuticular 5-0 Polysorb suture.  Quarter-inch Steri-Strips, 2 x 2 Neosporin, and a Tegaderm dressing was applied. Prior to closure, all sponge, needle, and instrument counts were found to be correct.  Estimated blood loss was minimal.  The patient received about 600 mL of crystalloids intraoperatively.  No drains were placed. There were no complications.     Barbaraann Barthel, M.D.     WB/MEDQ  D:  08/16/2013  T:  08/17/2013  Job:  [id number]

## 2013-08-19 ENCOUNTER — Other Ambulatory Visit (HOSPITAL_COMMUNITY): Payer: Self-pay | Admitting: Hematology and Oncology

## 2013-08-20 ENCOUNTER — Encounter (HOSPITAL_COMMUNITY): Payer: Self-pay | Admitting: General Surgery

## 2013-08-26 ENCOUNTER — Ambulatory Visit (HOSPITAL_COMMUNITY): Payer: BC Managed Care – PPO

## 2013-08-27 ENCOUNTER — Encounter (HOSPITAL_COMMUNITY): Payer: Self-pay

## 2013-08-27 ENCOUNTER — Other Ambulatory Visit (HOSPITAL_COMMUNITY): Payer: Self-pay | Admitting: Oncology

## 2013-08-27 ENCOUNTER — Telehealth (HOSPITAL_COMMUNITY): Payer: Self-pay | Admitting: *Deleted

## 2013-08-27 ENCOUNTER — Encounter (HOSPITAL_BASED_OUTPATIENT_CLINIC_OR_DEPARTMENT_OTHER): Payer: BC Managed Care – PPO

## 2013-08-27 VITALS — BP 142/74 | HR 81 | Temp 98.1°F | Resp 18

## 2013-08-27 VITALS — BP 142/85 | HR 95 | Temp 98.7°F | Resp 20 | Wt 158.0 lb

## 2013-08-27 DIAGNOSIS — C50919 Malignant neoplasm of unspecified site of unspecified female breast: Secondary | ICD-10-CM

## 2013-08-27 DIAGNOSIS — C773 Secondary and unspecified malignant neoplasm of axilla and upper limb lymph nodes: Secondary | ICD-10-CM

## 2013-08-27 DIAGNOSIS — I10 Essential (primary) hypertension: Secondary | ICD-10-CM

## 2013-08-27 DIAGNOSIS — Z5111 Encounter for antineoplastic chemotherapy: Secondary | ICD-10-CM

## 2013-08-27 DIAGNOSIS — D72829 Elevated white blood cell count, unspecified: Secondary | ICD-10-CM

## 2013-08-27 DIAGNOSIS — E876 Hypokalemia: Secondary | ICD-10-CM

## 2013-08-27 DIAGNOSIS — C50912 Malignant neoplasm of unspecified site of left female breast: Secondary | ICD-10-CM

## 2013-08-27 DIAGNOSIS — R232 Flushing: Secondary | ICD-10-CM

## 2013-08-27 LAB — COMPREHENSIVE METABOLIC PANEL
ALT: 23 U/L (ref 0–35)
AST: 20 U/L (ref 0–37)
Alkaline Phosphatase: 102 U/L (ref 39–117)
BUN: 14 mg/dL (ref 6–23)
Calcium: 9.7 mg/dL (ref 8.4–10.5)
Chloride: 102 mEq/L (ref 96–112)
GFR calc Af Amer: >90 mL/min (ref >90)
Glucose, Bld: 115 mg/dL — ABNORMAL HIGH (ref 70–99)
Potassium: 3.5 mEq/L — ABNORMAL LOW (ref 3.7–5.3)
Sodium: 140 mEq/L (ref 137–147)
Total Bilirubin: 0.2 mg/dL — ABNORMAL LOW (ref 0.3–1.2)
Total Protein: 7.3 g/dL (ref 6.0–8.3)

## 2013-08-27 LAB — CBC WITH DIFFERENTIAL/PLATELET
Basophils Absolute: 0 10*3/uL (ref 0.0–0.1)
Eosinophils Relative: 0 % (ref 0–5)
Hemoglobin: 11.7 g/dL — ABNORMAL LOW (ref 12.0–15.0)
Lymphocytes Relative: 11 % — ABNORMAL LOW (ref 12–46)
Lymphs Abs: 1.7 10*3/uL (ref 0.7–4.0)
Monocytes Relative: 7 % (ref 3–12)
Neutro Abs: 12.8 10*3/uL — ABNORMAL HIGH (ref 1.7–7.7)
Neutrophils Relative %: 82 % — ABNORMAL HIGH (ref 43–77)
Platelets: 258 10*3/uL (ref 150–400)
RBC: 3.56 MIL/uL — ABNORMAL LOW (ref 3.87–5.11)
WBC: 15.6 10*3/uL — ABNORMAL HIGH (ref 4.0–10.5)

## 2013-08-27 MED ORDER — TRASTUZUMAB CHEMO INJECTION 440 MG
8.0000 mg/kg | Freq: Once | INTRAVENOUS | Status: AC
  Administered 2013-08-27: 567 mg via INTRAVENOUS
  Filled 2013-08-27: qty 27

## 2013-08-27 MED ORDER — ACETAMINOPHEN 325 MG PO TABS
650.0000 mg | ORAL_TABLET | Freq: Once | ORAL | Status: AC
  Administered 2013-08-27: 650 mg via ORAL
  Filled 2013-08-27: qty 2

## 2013-08-27 MED ORDER — POTASSIUM CHLORIDE CRYS ER 20 MEQ PO TBCR: 20.0000 meq | EXTENDED_RELEASE_TABLET | Freq: Every day | ORAL | Status: DC

## 2013-08-27 MED ORDER — HEPARIN SOD (PORK) LOCK FLUSH 100 UNIT/ML IV SOLN
500.0000 [IU] | Freq: Once | INTRAVENOUS | Status: AC | PRN
  Administered 2013-08-27: 500 [IU]
  Filled 2013-08-27: qty 5

## 2013-08-27 MED ORDER — LORAZEPAM 2 MG/ML IJ SOLN
INTRAMUSCULAR | Status: AC
  Filled 2013-08-27: qty 1

## 2013-08-27 MED ORDER — SODIUM CHLORIDE 0.9 % IJ SOLN: 10.0000 mL | INTRAMUSCULAR | Status: DC | PRN

## 2013-08-27 MED ORDER — SODIUM CHLORIDE 0.9 % IV SOLN
Freq: Once | INTRAVENOUS | Status: AC
  Administered 2013-08-27: 10:00:00 via INTRAVENOUS

## 2013-08-27 MED ORDER — SODIUM CHLORIDE 0.9 % IV SOLN
Freq: Once | INTRAVENOUS | Status: AC
  Administered 2013-08-27: 16 mg via INTRAVENOUS
  Filled 2013-08-27: qty 8

## 2013-08-27 MED ORDER — SODIUM CHLORIDE 0.9 % IV SOLN: 16.0000 mg | Freq: Once | INTRAVENOUS | Status: DC

## 2013-08-27 MED ORDER — DIPHENHYDRAMINE HCL 25 MG PO CAPS
50.0000 mg | ORAL_CAPSULE | Freq: Once | ORAL | Status: AC
  Administered 2013-08-27: 50 mg via ORAL
  Filled 2013-08-27: qty 2

## 2013-08-27 MED ORDER — DOCETAXEL CHEMO INJECTION 160 MG/16ML
75.0000 mg/m2 | Freq: Once | INTRAVENOUS | Status: AC
  Administered 2013-08-27: 130 mg via INTRAVENOUS
  Filled 2013-08-27: qty 13

## 2013-08-27 MED ORDER — LORAZEPAM 2 MG/ML IJ SOLN
1.0000 mg | Freq: Once | INTRAMUSCULAR | Status: AC
  Administered 2013-08-27: 1 mg via INTRAVENOUS

## 2013-08-27 MED ORDER — DEXAMETHASONE SODIUM PHOSPHATE 10 MG/ML IJ SOLN: 20.0000 mg | Freq: Once | INTRAMUSCULAR | Status: DC

## 2013-08-27 MED ORDER — SODIUM CHLORIDE 0.9 % IV SOLN
573.0000 mg | Freq: Once | INTRAVENOUS | Status: AC
  Administered 2013-08-27: 570 mg via INTRAVENOUS
  Filled 2013-08-27: qty 57

## 2013-08-27 NOTE — Telephone Encounter (Signed)
Patient notified that potassium escribed to [facility] aid and take 1 a day.

## 2013-08-27 NOTE — Progress Notes (Signed)
[facility]  OFFICE PROGRESS NOTE  Terie Purser, PA-C [address] [place] [address]  DIAGNOSIS: Breast cancer metastasized to axillary lymph node, unspecified laterality  Chief Complaint  Patient presents with  . Breast Cancer    HER-2/neu overexpressed    CURRENT THERAPY: To receive cycle #1 of TCH today followed by Neulasta tomorrow.  INTERVAL HISTORY: Heather Vaughan 57 y.o. female returns for initiation of adjuvant chemotherapy for HER-2 overexpressed breast cancer, stage II, ER/PR positive. MUGA scan is normal. Patient does feel anxious today. Her face is flushed as well from steroids that she take this morning. She is hungry as well. She been told that other individuals about the potential negative size of treatment and I tried to impress upon her the fact that as an individual, she will respond as always she can respond and not like other people have responded in the past. Her husband is here with her which is definitely a positive factor.  MEDICAL HISTORY: Past Medical History  Diagnosis Date  . GERD (gastroesophageal reflux disease)   . Hypertension   . Bronchitis 07/27/2011  . Barrett's esophagus   . Anxiety   . Breast cancer, left breast     INTERIM HISTORY: has GERD (gastroesophageal reflux disease); Hypertension; Barrett esophagus; and Breast cancer metastasized to axillary lymph node on her problem list.    ALLERGIES:  has No Known Allergies.  MEDICATIONS: has a current medication list which includes the following prescription(s): acetaminophen, carboplatin, dexamethasone, docetaxel, duloxetine, lidocaine-prilocaine, losartan-hydrochlorothiazide, metoclopramide, nexium, pegfilgrastim, prochlorperazine, trastuzumab, and trazodone, and the following Facility-Administered Medications: acetaminophen, CARBOplatin (PARAPLATIN) 570 mg in sodium chloride 0.9 % 250 mL chemo infusion, diphenhydramine, DOCEtaxel  (TAXOTERE) 130 mg in dextrose 5 % 250 mL chemo infusion, heparin lock flush, sodium chloride, and trastuzumab (HERCEPTIN) 567 mg in sodium chloride 0.9 % 250 mL chemo infusion.  SURGICAL HISTORY:  Past Surgical History  Procedure Laterality Date  . Excision morton's neuroma  07/14/2011    Procedure: EXCISION MORTON'S NEUROMA;  Surgeon: Dallas Schimke;  Location: [facility];  Service: Orthopedics;  Laterality: Right;  Removal Neuroma 3rd Interspace Right Foot  . Esophagogastroduodenoscopy  08/03/2011    Procedure: ESOPHAGOGASTRODUODENOSCOPY (EGD);  Surgeon: Malissa Hippo, MD;  Location: [facility];  Service: Endoscopy;  Laterality: N/A;  12:45  . Partial mastectomy with needle localization Left 07/03/2013    Procedure: PARTIAL MASTECTOMY STATUS POST NEEDLE LOCALIZATION;  Surgeon: Marlane Hatcher, MD;  Location: [facility];  Service: General;  Laterality: Left;  . Breast biopsy Bilateral     X 4- benign  . Mastectomy modified radical Left 07/17/2013    Procedure: MASTECTOMY MODIFIED RADICAL WITH LEFT AXILLARY TISSUE REMOVAL;  Surgeon: Marlane Hatcher, MD;  Location: [facility];  Service: General;  Laterality: Left;  . Portacath placement Right 08/16/2013    Procedure: INSERTION PORT-A-CATH RIGHT SUBCLAVIAN;  Surgeon: Marlane Hatcher, MD;  Location: [facility];  Service: General;  Laterality: Right;    FAMILY HISTORY: family history is negative for Anesthesia problems, Hypotension, Malignant hyperthermia, and Pseudochol deficiency.  SOCIAL HISTORY:  reports that she quit smoking about 19 years ago. Her smoking use included Cigarettes. She has a 20 pack-year smoking history. She does not have any smokeless tobacco history on file. She reports that she does not drink alcohol or use illicit drugs.  REVIEW OF SYSTEMS:  Other than that discussed above is noncontributory.  PHYSICAL EXAMINATION: ECOG PERFORMANCE STATUS: 1 -  Symptomatic but completely ambulatory  Blood pressure 142/85, pulse 95,  temperature 98.7 F (37.1 C), temperature source Oral, resp. rate 20, weight 158 lb (71.668 kg).  GENERAL:alert, no distress and comfortable SKIN: skin color, texture, turgor are normal, no rashes or significant lesions. Flushed appearance. EYES: PERLA; Conjunctiva are pink and non-injected, sclera clear OROPHARYNX:no exudate, no erythema on lips, buccal mucosa, or tongue. NECK: supple, thyroid normal size, non-tender, without nodularity. No masses CHEST: Status post left mastectomy with no subcutaneous nodules. Right breast without mass. LYMPH:  no palpable lymphadenopathy in the cervical, axillary or inguinal LUNGS: clear to auscultation and percussion with normal breathing effort HEART: regular rate & rhythm and no murmurs. ABDOMEN:abdomen soft, non-tender and normal bowel sounds MUSCULOSKELETAL:no cyanosis of digits and no clubbing. Range of motion normal.  NEURO: alert & oriented x 3 with fluent speech, no focal motor/sensory deficits   LABORATORY DATA: Infusion on 08/27/2013  Component Date Value Range Status  . WBC 08/27/2013 15.6* 4.0 - 10.5 K/uL Final  . RBC 08/27/2013 3.56* 3.87 - 5.11 MIL/uL Final  . Hemoglobin 08/27/2013 11.7* 12.0 - 15.0 g/dL Final  . HCT 40/98/1191 35.0* 36.0 - 46.0 % Final  . MCV 08/27/2013 98.3  78.0 - 100.0 fL Final  . MCH 08/27/2013 32.9  26.0 - 34.0 pg Final  . MCHC 08/27/2013 33.4  30.0 - 36.0 g/dL Final  . RDW 47/82/9562 12.3  11.5 - 15.5 % Final  . Platelets 08/27/2013 258  150 - 400 K/uL Final  . Neutrophils Relative % 08/27/2013 82* 43 - 77 % Final  . Neutro Abs 08/27/2013 12.8* 1.7 - 7.7 K/uL Final  . Lymphocytes Relative 08/27/2013 11* 12 - 46 % Final  . Lymphs Abs 08/27/2013 1.7  0.7 - 4.0 K/uL Final  . Monocytes Relative 08/27/2013 7  3 - 12 % Final  . Monocytes Absolute 08/27/2013 1.1* 0.1 - 1.0 K/uL Final  . Eosinophils Relative 08/27/2013 0  0 - 5 % Final  . Eosinophils Absolute 08/27/2013 0.0  0.0 - 0.7 K/uL Final  . Basophils  Relative 08/27/2013 0  0 - 1 % Final  . Basophils Absolute 08/27/2013 0.0  0.0 - 0.1 K/uL Final  . Sodium 08/27/2013 140  137 - 147 mEq/L Final   Please note change in reference range.  . Potassium 08/27/2013 3.5* 3.7 - 5.3 mEq/L Final   Please note change in reference range.  . Chloride 08/27/2013 102  96 - 112 mEq/L Final  . CO2 08/27/2013 26  19 - 32 mEq/L Final  . Glucose, Bld 08/27/2013 115* 70 - 99 mg/dL Final  . BUN 13/03/6577 14  6 - 23 mg/dL Final  . Creatinine, Ser 08/27/2013 0.58  0.50 - 1.10 mg/dL Final  . Calcium 46/96/2952 9.7  8.4 - 10.5 mg/dL Final  . Total Protein 08/27/2013 7.3  6.0 - 8.3 g/dL Final  . Albumin 84/13/2440 3.9  3.5 - 5.2 g/dL Final  . AST 06/25/2535 20  0 - 37 U/L Final  . ALT 08/27/2013 23  0 - 35 U/L Final  . Alkaline Phosphatase 08/27/2013 102  39 - 117 U/L Final  . Total Bilirubin 08/27/2013 0.2* 0.3 - 1.2 mg/dL Final  . GFR calc non Af Amer 08/27/2013 >90  >90 mL/min Final  . GFR calc Af Amer 08/27/2013 >90  >90 mL/min Final   Comment: (NOTE)  The eGFR has been calculated using the CKD EPI equation.                          This calculation has not been validated in all clinical situations.                          eGFR's persistently <90 mL/min signify possible Chronic Kidney                          Disease.  Hospital Outpatient Visit on 08/15/2013  Component Date Value Range Status  . Potassium 08/15/2013 3.6  3.5 - 5.1 mEq/L Final  Office Visit on 08/09/2013  Component Date Value Range Status  . WBC 08/09/2013 6.8  4.0 - 10.5 K/uL Final  . RBC 08/09/2013 3.72* 3.87 - 5.11 MIL/uL Final  . Hemoglobin 08/09/2013 12.1  12.0 - 15.0 g/dL Final  . HCT 40/98/1191 37.0  36.0 - 46.0 % Final  . MCV 08/09/2013 99.5  78.0 - 100.0 fL Final  . MCH 08/09/2013 32.5  26.0 - 34.0 pg Final  . MCHC 08/09/2013 32.7  30.0 - 36.0 g/dL Final  . RDW 47/82/9562 12.3  11.5 - 15.5 % Final  . Platelets 08/09/2013 241  150 - 400 K/uL Final  .  Neutrophils Relative % 08/09/2013 55  43 - 77 % Final  . Neutro Abs 08/09/2013 3.7  1.7 - 7.7 K/uL Final  . Lymphocytes Relative 08/09/2013 35  12 - 46 % Final  . Lymphs Abs 08/09/2013 2.4  0.7 - 4.0 K/uL Final  . Monocytes Relative 08/09/2013 6  3 - 12 % Final  . Monocytes Absolute 08/09/2013 0.4  0.1 - 1.0 K/uL Final  . Eosinophils Relative 08/09/2013 4  0 - 5 % Final  . Eosinophils Absolute 08/09/2013 0.3  0.0 - 0.7 K/uL Final  . Basophils Relative 08/09/2013 0  0 - 1 % Final  . Basophils Absolute 08/09/2013 0.0  0.0 - 0.1 K/uL Final  . Sodium 08/09/2013 140  135 - 145 mEq/L Final  . Potassium 08/09/2013 3.0* 3.5 - 5.1 mEq/L Final  . Chloride 08/09/2013 102  96 - 112 mEq/L Final  . CO2 08/09/2013 27  19 - 32 mEq/L Final  . Glucose, Bld 08/09/2013 99  70 - 99 mg/dL Final  . BUN 13/03/6577 17  6 - 23 mg/dL Final  . Creatinine, Ser 08/09/2013 0.75  0.50 - 1.10 mg/dL Final  . Calcium 46/96/2952 9.3  8.4 - 10.5 mg/dL Final  . Total Protein 08/09/2013 7.5  6.0 - 8.3 g/dL Final  . Albumin 84/13/2440 4.0  3.5 - 5.2 g/dL Final  . AST 06/25/2535 22  0 - 37 U/L Final  . ALT 08/09/2013 24  0 - 35 U/L Final  . Alkaline Phosphatase 08/09/2013 107  39 - 117 U/L Final  . Total Bilirubin 08/09/2013 0.2* 0.3 - 1.2 mg/dL Final  . GFR calc non Af Amer 08/09/2013 >90  >90 mL/min Final  . GFR calc Af Amer 08/09/2013 >90  >90 mL/min Final   Comment: (NOTE)                          The eGFR has been calculated using the CKD EPI equation.  This calculation has not been validated in all clinical situations.                          eGFR's persistently <90 mL/min signify possible Chronic Kidney                          Disease.  . CEA 08/09/2013 <0.5  0.0 - 5.0 ng/mL Final   Performed at [facility]  . CA 27.29 08/09/2013 26  0 - 39 U/mL Final   Performed at [facility]  . Vit D, 25-Hydroxy 08/09/2013 30  30 - 89 ng/mL Final   Comment: (NOTE)                           This assay accurately quantifies Vitamin D, which is the sum of the                          25-Hydroxy forms of Vitamin D2 and D3.  Studies have shown that the                          optimum concentration of 25-Hydroxy Vitamin D is 30 ng/mL or higher.                           Concentrations of Vitamin D between 20 and 29 ng/mL are considered to                          be insufficient and concentrations less than 20 ng/mL are considered                          to be deficient for Vitamin D.                          Performed at [facility]    PATHOLOGY: for SKYLAN, GIFT ([id number]) Patient: KOLLINS, FENTER Collected: 07/17/2013 Client: [facility] Accession: [id number] Received: 07/17/2013 Barbaraann Barthel DOB: [birth year] Age: 62 Gender: F Reported: 07/19/2013 [address] Patient Ph: [phone] MRN #: [id number] [place] [address] Visit #: [id number] Chart #: Phone: [phone] Fax: CC: REPORT OF SURGICAL PATHOLOGY ADDITIONAL INFORMATION: CHROMOGENIC IN-SITU HYBRIDIZATION Results: HER2/NEU BY CISH - SHOWS AMPLIFICATION BY CISH ANALYSIS. RESULT RATIO OF HER2: CEP 17 SIGNALS 2.0 AVERAGE HER2 COPY NUMBER PER CELL 3.00 REFERENCE RANGE NEGATIVE HER2/Chr17 Ratio <2.0 and Average HER2 copy number <4.0 EQUIVOCAL HER2/Chr17 Ratio <2.0 and Average HER2 copy number 4.0 and <6.0 POSITIVE HER2/Chr17 Ratio >=2.0 and/or Average HER2 copy number >=6.0 OF NOTE, THE TUMOR APPEARS TO SHOW HETEROGENEITY IN REGARDS TO HER2 EXPRESSION. Pecola Leisure MD Pathologist, Electronic Signature ( Signed 07/24/2013) FINAL DIAGNOSIS Diagnosis Breast, modified radical mastectomy , left, with axillary contents - NO RESIDUAL CARCINOMA. - PREVIOUS EXCISIONAL SITE CHANGES. - BACKGROUND FIBROCYSTIC CHANGES. - INKED MARGINS, NEGATIVE FOR ATYPIA OR MALIGNANCY. - ONE OF TWELVE AXILLARY LYMPH NODES, POSITIVE FOR METASTATIC CARCINOMA WITH FOCAL EXTRANODAL EXTENSION(1/12). - ALL  FINAL RESECTION MARGINS, NEGATIVE FOR ATYPIA OR MALIGNANCY. - PLEASE SEE ONCOLOGY TEMPLATE FOR DETAIL. 1 of 3 Duplicate copy FINAL for Orchard, Orelia C ([id number]) Microscopic Comment BREAST, INVASIVE TUMOR, WITH LYMPH NODE SAMPLING (  PLEASE CORRELATE WITH PREVIOUS CASE [phone] FOR DETAILS) Specimen, including laterality and lymph node sampling (sentinel, non-sentinel): Left breast Procedure: Modified radical mastectomy with left axillary lymph node dissection Histologic type: Invasive ductal carcinoma (please correlate with previous case [phone]) Grade: II Tubule formation: 3 Nuclear pleomorphism: 2 Mitotic:2 Tumor size (gross measurement or glass slide measurement): 1.4 cm (please correlate with previous case [id number] for detail) Margins: Final resection margins, negative for atypia or malignancy. Lymphovascular invasion: Not identified Ductal carcinoma in situ: Present Grade: High grade Extensive intraductal component: Present Lobular neoplasia: No Tumor focality: Unifocal Treatment effect: No Extent of tumor: Skin: No Nipple: No Skeletal muscle: N/A Lymph nodes: Examined: N/A Sentinel 12 Non-sentinel 12 Total Lymph nodes with metastasis: 1 Isolated tumor cells (< 0.2 mm): 0 Micrometastasis: (> 0.2 mm and < 2.0 mm): 0 Macrometastasis: (> 2.0 mm): 1 Extracapsular extension: 1 Breast prognostic profile: Please correlate with previous case for detail Estrogen receptor: 74%, positive, moderate staining intensity Progesterone receptor: 16%, positive, weak staining intensity Her 2 neu: No amplification of Her 2 neu detected, ratio of Her 2 neu: cep17: 1.4; average HER-2 copy numbers per cell: 2.8 Ki-67: 52% Her 2 neu will be repeated on the axillary lymph node and an addendum report will follow Non-neoplastic breast: Fibrocystic changes TNM: pT1c, pN1a, pMX (HCL:caf 07/19/13) Gwendolyn Grant LI MD Pathologist, Electronic Signature (Case signed 07/19/2013) Specimen  Gross and Clinical Information Specimen(s) Obtained: Breast, modified radical mastectomy , left, with axillary contents 2 of 3 Duplicate copy FINAL for [place], Gailene C ([id number]) Specimen Clinical Information invasive ductal carcinoma left breast Gross Specimen: Left modified radical mastectomy, including axillary contents. Specimen integrity (intact/disrupted): Portion of axillary contents are separate from main specimen. Weight: 512 grams. Size: 20 x 13 x 6 cm. Skin: There is an 18 x 8 cm tan white ellipse with a central 4 cm in diameter nipple and surrounding areola, with a possible faint healed scar adjacent to the areola. Tumor/cavity: Within the central specimen is a 5.3 x 3.6 x 1.4 cm cavity which contains dark red soft material, and is surrounded by tan yellow to red indurated tissue. Residual lesion or mass is not identified. The indurated tissue surrounding the cavity comes within 1.7 cm of the deep margin. Uninvolved parenchyma: The remaining specimen consists predominantly of soft fatty tissue with minimal gray white soft fibrous tissue. Prognostic indicators: N/A Lymph nodes: The entire portion of axillary contents is 6 x 4 x 3 cm of aggregate and within which are found twelve possible lymph nodes ranging from 0.4 to 2.6 cm. Block summary: A - E = sections around cavity F, G = deep margin nearest cavity H = superior lateral I = inferior lateral J = superior medial K = inferior medial L = four nodes, whole M = four nodes, whole N = one node bisected O = one node bisected P = one node bisected Q, R = largest node Total eighteen blocks (SW:caf 07/18/13) Stain(s) used in Diagnosis: The following stain(s) were used in diagnosing the case: CISH. The control(s) stained appropriately. Disclaimer Her2Neu by CISH (chromogenic in-situ hybridization) is performed at [facility], using the Her2CISH pharmDx Kit (code number [id number]) Report signed out from the  following location(s) Technical Component performed at [facility] [address],[place], [place] [address] CLIA: [id number]., Technical Component performed at [facility]. [address],[place] [address].CLIA:[id number],CAP:[id number]., Interpretation performed at [facility]  Urinalysis No results found for this basename: colorurine,  appearanceur,  labspec,  phurine,  glucoseu,  hgbur,  bilirubinur,  ketonesur,  proteinur,  urobilinogen,  nitrite,  leukocytesur    RADIOGRAPHIC STUDIES: Nm Cardiac Muga Rest  08/13/2013   CLINICAL DATA:  Pre chemotherapy, breast cancer  EXAM: NUCLEAR MEDICINE CARDIAC BLOOD POOL IMAGING (MUGA)  TECHNIQUE: Cardiac multi-gated acquisition was performed at rest following intravenous injection of Tc-12m labeled red blood cells.  COMPARISON:  None.  RADIOPHARMACEUTICALS:  CURIE ULTRATAG TECHNETIUM TC 103M-LABELED RED BLOOD CELLS IV KITmCiTc-55m in-vitro labeled red blood cells.  FINDINGS: Resting left ventricular ejection fraction is calculated at 62%.  Grossly normal left ventricular wall motion.  IMPRESSION: 62% baseline ejection fraction.   Electronically Signed   By: Ruel Favors M.D.   On: 08/13/2013 16:26   Dg Chest Portable 1 View  08/16/2013   CLINICAL DATA:  Postoperative from Port-A-Cath placement.  EXAM: PORTABLE CHEST - 1 VIEW  COMPARISON:  Chest x-ray of November 27, 2008.  FINDINGS: A Port-A-Cath appliance has been placed via the right subclavian approach with the tip of the catheter in the region of the proximal to mid SVC. There is no postprocedure pneumothorax or pneumothorax. The lungs are well-expanded. The interstitial markings are mildly prominent bilaterally. The cardiopericardial silhouette is normal in size. The pulmonary vascularity is not engorged. The patient has undergone previous left mastectomy and left axillary lymph node dissection.  IMPRESSION: There is no evidence of a postprocedure  complication following placement of the Port-A-Cath appliance on the right.   Electronically Signed   By: David  [place]   On: 08/16/2013 12:16   Dg C-arm 1-60 Min-no Report  08/16/2013   CLINICAL DATA: port a cath placement   C-ARM 1-60 MINUTES  Fluoroscopy was utilized by the requesting physician.  No radiographic  interpretation.     ASSESSMENT:  #1. Stage II (T1 c. N1 M0) duct cell carcinoma, ER/PR positive, HER-2/neu overexpressed as determined in the left singular lymph node, status post left modified radical mastectomy, for cycle #1 of TCH today followed by Neulasta tomorrow.  #2. Barrett's esophagus, on treatment.  #3. Gastroesophageal reflux disease.  #4. Hypertension, controlled. #5. Leukocytosis, flushing, and increased appetite secondary to dexamethasone    PLAN:  #1. Cycle 1 of TCH today followed by Neulasta 6 mg subcutaneous he tomorrow. Ativan 1 mg intravenously will be given prior to initiation of therapy today. #2. Followup in one week with CBC and office visit.   All questions were answered. The patient knows to call the clinic with any problems, questions or concerns. We can certainly see the patient much sooner if necessary.   I spent 25 minutes counseling the patient face to face. The total time spent in the appointment was 30 minutes.    Maurilio Lovely, MD 08/27/2013 11:31 AM

## 2013-08-27 NOTE — Progress Notes (Signed)
Tolerated chemo well. 

## 2013-08-27 NOTE — Patient Instructions (Addendum)
[facility] Discharge Instructions  RECOMMENDATIONS MADE BY THE CONSULTANT AND ANY TEST RESULTS WILL BE SENT TO YOUR REFERRING PHYSICIAN.  EXAM FINDINGS BY THE PHYSICIAN TODAY AND SIGNS OR SYMPTOMS TO REPORT TO CLINIC OR PRIMARY PHYSICIAN: Exam and findings as discussed by Dr. Zigmund Daniel.  Will give you some ativan before you get treated today.  Report fevers, chills, uncontrolled nausea, vomiting or other problems.  MEDICATIONS PRESCRIBED:  none  INSTRUCTIONS/FOLLOW-UP: Follow-up in 1 week.  Thank you for choosing [facility] to provide your oncology and hematology care.  To afford each patient quality time with our providers, please arrive at least 15 minutes before your scheduled appointment time.  With your help, our goal is to use those 15 minutes to complete the necessary work-up to ensure our physicians have the information they need to help with your evaluation and healthcare recommendations.    Effective January 1st, 2014, we ask that you re-schedule your appointment with our physicians should you arrive 10 or more minutes late for your appointment.  We strive to give you quality time with our providers, and arriving late affects you and other patients whose appointments are after yours.    Again, thank you for choosing [facility].  Our hope is that these requests will decrease the amount of time that you wait before being seen by our physicians.       _____________________________________________________________  Should you have questions after your visit to [facility], please contact our office at [phone] between the hours of 8:30 a.m. and 5:00 p.m.  Voicemails left after 4:30 p.m. will not be returned until the following business day.  For prescription refill requests, have your pharmacy contact our office with your prescription refill request.

## 2013-08-28 ENCOUNTER — Encounter (HOSPITAL_BASED_OUTPATIENT_CLINIC_OR_DEPARTMENT_OTHER): Payer: BC Managed Care – PPO

## 2013-08-28 VITALS — BP 142/87 | HR 77 | Temp 98.2°F | Resp 16

## 2013-08-28 DIAGNOSIS — Z5189 Encounter for other specified aftercare: Secondary | ICD-10-CM

## 2013-08-28 DIAGNOSIS — C773 Secondary and unspecified malignant neoplasm of axilla and upper limb lymph nodes: Secondary | ICD-10-CM

## 2013-08-28 DIAGNOSIS — C50919 Malignant neoplasm of unspecified site of unspecified female breast: Secondary | ICD-10-CM

## 2013-08-28 DIAGNOSIS — C50912 Malignant neoplasm of unspecified site of left female breast: Secondary | ICD-10-CM

## 2013-08-28 MED ORDER — PEGFILGRASTIM INJECTION 6 MG/0.6ML
6.0000 mg | Freq: Once | SUBCUTANEOUS | Status: AC
  Administered 2013-08-28: 6 mg via SUBCUTANEOUS

## 2013-08-28 MED ORDER — PEGFILGRASTIM INJECTION 6 MG/0.6ML
SUBCUTANEOUS | Status: AC
  Filled 2013-08-28: qty 0.6

## 2013-08-28 NOTE — Progress Notes (Signed)
Heather Vaughan presents today for injection per the provider's orders.  Neulasta administered administration without incident; see MAR for injection details.  Patient tolerated procedure well and without incident.  Also reports no adverse affects from yesterday's chemotherapy; reports tolerating it very well.  No questions or complaints noted at this time.

## 2013-09-02 ENCOUNTER — Telehealth (HOSPITAL_COMMUNITY): Payer: Self-pay | Admitting: *Deleted

## 2013-09-02 NOTE — Telephone Encounter (Signed)
I called Heather Vaughan to see how she was this am. She is doing great. No nausea. No problems. She did state that for the 2 days after chemo, she was extremely tired. But otherwise, is doing great. Patient notified to call us if there are any questions or problems. She said she would. Patient has f/u appt with Dr. Barnet Glasgow on Jan 7 @ 10.

## 2013-09-04 ENCOUNTER — Encounter (HOSPITAL_COMMUNITY): Payer: Self-pay

## 2013-09-04 ENCOUNTER — Encounter (HOSPITAL_COMMUNITY): Payer: BC Managed Care – PPO | Attending: Hematology and Oncology

## 2013-09-04 VITALS — BP 123/74 | HR 87 | Temp 98.4°F | Wt 156.1 lb

## 2013-09-04 DIAGNOSIS — E876 Hypokalemia: Secondary | ICD-10-CM | POA: Insufficient documentation

## 2013-09-04 DIAGNOSIS — C50919 Malignant neoplasm of unspecified site of unspecified female breast: Secondary | ICD-10-CM

## 2013-09-04 DIAGNOSIS — C773 Secondary and unspecified malignant neoplasm of axilla and upper limb lymph nodes: Secondary | ICD-10-CM

## 2013-09-04 DIAGNOSIS — C50912 Malignant neoplasm of unspecified site of left female breast: Secondary | ICD-10-CM

## 2013-09-04 LAB — CBC WITH DIFFERENTIAL/PLATELET
Basophils Absolute: 0.1 10*3/uL (ref 0.0–0.1)
Basophils Relative: 1 % (ref 0–1)
EOS ABS: 0.1 10*3/uL (ref 0.0–0.7)
Eosinophils Relative: 1 % (ref 0–5)
HCT: 33.9 % — ABNORMAL LOW (ref 36.0–46.0)
Hemoglobin: 11.2 g/dL — ABNORMAL LOW (ref 12.0–15.0)
LYMPHS ABS: 3.8 10*3/uL (ref 0.7–4.0)
Lymphocytes Relative: 29 % (ref 12–46)
MCH: 32.7 pg (ref 26.0–34.0)
MCHC: 33 g/dL (ref 30.0–36.0)
MCV: 98.8 fL (ref 78.0–100.0)
Monocytes Absolute: 2.2 10*3/uL — ABNORMAL HIGH (ref 0.1–1.0)
Monocytes Relative: 17 % — ABNORMAL HIGH (ref 3–12)
NEUTROS ABS: 6.8 10*3/uL (ref 1.7–7.7)
NEUTROS PCT: 53 % (ref 43–77)
Platelets: 135 10*3/uL — ABNORMAL LOW (ref 150–400)
RBC: 3.43 MIL/uL — ABNORMAL LOW (ref 3.87–5.11)
RDW: 12.3 % (ref 11.5–15.5)
WBC: 12.9 10*3/uL — ABNORMAL HIGH (ref 4.0–10.5)

## 2013-09-04 NOTE — Progress Notes (Signed)
[name]  OFFICE PROGRESS NOTE  [name], PA-C [address] [place] [address]  DIAGNOSIS: Breast cancer metastasized to axillary lymph node, left - Plan: CBC with Differential, CBC with Differential  Chief Complaint  Patient presents with  . Breast Cancer    1 week after initiating adjuvant chemotherapy     CURRENT THERAPY: TCH, cycle 1 given one week ago.  INTERVAL HISTORY: Heather Vaughan 58 y.o. female returns for followup after initiation of adjuvant chemotherapy with [facility] for stage II left breast cancer, ER/PR positive, HER-2/neu overexpressed. She received Neulasta 6 mg subcutaneously the day after chemotherapy. Other than feeling fatigued, she tolerated her first treatment well. She denies any peripheral paresthesias, nausea, vomiting, sore mouth, but did have loose bowel movements which is unusual for her. She denies any melena, hematochezia, hematuria, epistaxis, vaginal bleeding, lower extremity swelling or redness, skin rash, headache, or seizures. She also denies any bone pain from Neulasta.  MEDICAL HISTORY: Past Medical History  Diagnosis Date  . GERD (gastroesophageal reflux disease)   . Hypertension   . Bronchitis 07/27/2011  . Barrett's esophagus   . Anxiety   . Breast cancer, left breast     INTERIM HISTORY: has GERD (gastroesophageal reflux disease); Hypertension; Barrett esophagus; and Breast cancer metastasized to axillary lymph node on her problem list.    ALLERGIES:  has No Known Allergies.  MEDICATIONS: has a current medication list which includes the following prescription(s): acetaminophen, carboplatin, dexamethasone, docetaxel, duloxetine, lidocaine-prilocaine, losartan-hydrochlorothiazide, metoclopramide, nexium, pegfilgrastim, potassium chloride sa, prochlorperazine, trastuzumab, and trazodone.  SURGICAL HISTORY:  Past Surgical History  Procedure Laterality Date  . Excision  morton's neuroma  07/14/2011    Procedure: EXCISION MORTON'S NEUROMA;  Surgeon: Marcheta Grammes;  Location: [facility];  Service: Orthopedics;  Laterality: Right;  Removal Neuroma 3rd Interspace Right Foot  . Esophagogastroduodenoscopy  08/03/2011    Procedure: ESOPHAGOGASTRODUODENOSCOPY (EGD);  Surgeon: Rogene Houston, MD;  Location: [facility];  Service: Endoscopy;  Laterality: N/A;  12:45  . Partial mastectomy with needle localization Left 07/03/2013    Procedure: PARTIAL MASTECTOMY STATUS POST NEEDLE LOCALIZATION;  Surgeon: Scherry Ran, MD;  Location: [facility];  Service: General;  Laterality: Left;  . Breast biopsy Bilateral     X 4- benign  . Mastectomy modified radical Left 07/17/2013    Procedure: MASTECTOMY MODIFIED RADICAL WITH LEFT AXILLARY TISSUE REMOVAL;  Surgeon: Scherry Ran, MD;  Location: [facility];  Service: General;  Laterality: Left;  . Portacath placement Right 08/16/2013    Procedure: INSERTION PORT-A-CATH RIGHT SUBCLAVIAN;  Surgeon: Scherry Ran, MD;  Location: [facility];  Service: General;  Laterality: Right;    FAMILY HISTORY: family history is negative for Anesthesia problems, Hypotension, Malignant hyperthermia, and Pseudochol deficiency.  SOCIAL HISTORY:  reports that she quit smoking about 20 years ago. Her smoking use included Cigarettes. She has a 20 pack-year smoking history. She does not have any smokeless tobacco history on file. She reports that she does not drink alcohol or use illicit drugs.  REVIEW OF SYSTEMS:  Other than that discussed above is noncontributory.  PHYSICAL EXAMINATION: ECOG PERFORMANCE STATUS: 0 - Asymptomatic  Blood pressure 123/74, pulse 87, temperature 98.4 F (36.9 C), temperature source Oral, weight 156 lb 1.6 oz (70.806 kg).  GENERAL:alert, no distress and comfortable SKIN: skin color, texture, turgor are normal, no rashes or significant lesions EYES: PERLA; Conjunctiva are pink and non-injected, sclera  clear  OROPHARYNX:no exudate, no erythema on lips, buccal mucosa, or tongue. NECK: supple, thyroid normal size, non-tender, without nodularity. No masses CHEST: Status post left mastectomy with no subcutaneous nodules. Right breast without mass. LifePort in place on the right anterior chest. LYMPH:  no palpable lymphadenopathy in the cervical, axillary or inguinal LUNGS: clear to auscultation and percussion with normal breathing effort HEART: regular rate & rhythm and no murmurs. ABDOMEN:abdomen soft, non-tender and normal bowel sounds MUSCULOSKELETAL:no cyanosis of digits and no clubbing. Range of motion normal.  NEURO: alert & oriented x 3 with fluent speech, no focal motor/sensory deficits   LABORATORY DATA: Office Visit on 09/04/2013  Component Date Value Range Status  . WBC 09/04/2013 12.9* 4.0 - 10.5 K/uL Final  . RBC 09/04/2013 3.43* 3.87 - 5.11 MIL/uL Final  . Hemoglobin 09/04/2013 11.2* 12.0 - 15.0 g/dL Final  . HCT 09/04/2013 33.9* 36.0 - 46.0 % Final  . MCV 09/04/2013 98.8  78.0 - 100.0 fL Final  . MCH 09/04/2013 32.7  26.0 - 34.0 pg Final  . MCHC 09/04/2013 33.0  30.0 - 36.0 g/dL Final  . RDW 09/04/2013 12.3  11.5 - 15.5 % Final  . Platelets 09/04/2013 135* 150 - 400 K/uL Final  . Neutrophils Relative % 09/04/2013 53  43 - 77 % Final  . Neutro Abs 09/04/2013 6.8  1.7 - 7.7 K/uL Final  . Lymphocytes Relative 09/04/2013 29  12 - 46 % Final  . Lymphs Abs 09/04/2013 3.8  0.7 - 4.0 K/uL Final  . Monocytes Relative 09/04/2013 17* 3 - 12 % Final  . Monocytes Absolute 09/04/2013 2.2* 0.1 - 1.0 K/uL Final  . Eosinophils Relative 09/04/2013 1  0 - 5 % Final  . Eosinophils Absolute 09/04/2013 0.1  0.0 - 0.7 K/uL Final  . Basophils Relative 09/04/2013 1  0 - 1 % Final  . Basophils Absolute 09/04/2013 0.1  0.0 - 0.1 K/uL Final  . WBC Morphology 09/04/2013 MODERATE LEFT SHIFT (>5% METAS AND MYELOS,OCC PRO NOTED)   Final  . Smear Review 09/04/2013 LARGE PLATELETS PRESENT   Final   Infusion on 08/27/2013  Component Date Value Range Status  . WBC 08/27/2013 15.6* 4.0 - 10.5 K/uL Final  . RBC 08/27/2013 3.56* 3.87 - 5.11 MIL/uL Final  . Hemoglobin 08/27/2013 11.7* 12.0 - 15.0 g/dL Final  . HCT 08/27/2013 35.0* 36.0 - 46.0 % Final  . MCV 08/27/2013 98.3  78.0 - 100.0 fL Final  . MCH 08/27/2013 32.9  26.0 - 34.0 pg Final  . MCHC 08/27/2013 33.4  30.0 - 36.0 g/dL Final  . RDW 08/27/2013 12.3  11.5 - 15.5 % Final  . Platelets 08/27/2013 258  150 - 400 K/uL Final  . Neutrophils Relative % 08/27/2013 82* 43 - 77 % Final  . Neutro Abs 08/27/2013 12.8* 1.7 - 7.7 K/uL Final  . Lymphocytes Relative 08/27/2013 11* 12 - 46 % Final  . Lymphs Abs 08/27/2013 1.7  0.7 - 4.0 K/uL Final  . Monocytes Relative 08/27/2013 7  3 - 12 % Final  . Monocytes Absolute 08/27/2013 1.1* 0.1 - 1.0 K/uL Final  . Eosinophils Relative 08/27/2013 0  0 - 5 % Final  . Eosinophils Absolute 08/27/2013 0.0  0.0 - 0.7 K/uL Final  . Basophils Relative 08/27/2013 0  0 - 1 % Final  . Basophils Absolute 08/27/2013 0.0  0.0 - 0.1 K/uL Final  . Sodium 08/27/2013 140  137 - 147 mEq/L Final   Please note change in reference range.  Marland Kitchen  Potassium 08/27/2013 3.5* 3.7 - 5.3 mEq/L Final   Please note change in reference range.  . Chloride 08/27/2013 102  96 - 112 mEq/L Final  . CO2 08/27/2013 26  19 - 32 mEq/L Final  . Glucose, Bld 08/27/2013 115* 70 - 99 mg/dL Final  . BUN 08/27/2013 14  6 - 23 mg/dL Final  . Creatinine, Ser 08/27/2013 0.58  0.50 - 1.10 mg/dL Final  . Calcium 08/27/2013 9.7  8.4 - 10.5 mg/dL Final  . Total Protein 08/27/2013 7.3  6.0 - 8.3 g/dL Final  . Albumin 08/27/2013 3.9  3.5 - 5.2 g/dL Final  . AST 08/27/2013 20  0 - 37 U/L Final  . ALT 08/27/2013 23  0 - 35 U/L Final  . Alkaline Phosphatase 08/27/2013 102  39 - 117 U/L Final  . Total Bilirubin 08/27/2013 0.2* 0.3 - 1.2 mg/dL Final  . GFR calc non Af Amer 08/27/2013 >90  >90 mL/min Final  . GFR calc Af Amer 08/27/2013 >90  >90 mL/min  Final   Comment: (NOTE)                          The eGFR has been calculated using the CKD EPI equation.                          This calculation has not been validated in all clinical situations.                          eGFR's persistently <90 mL/min signify possible Chronic Kidney                          Disease.  Hospital Outpatient Visit on 08/15/2013  Component Date Value Range Status  . Potassium 08/15/2013 3.6  3.5 - 5.1 mEq/L Final  Office Visit on 08/09/2013  Component Date Value Range Status  . WBC 08/09/2013 6.8  4.0 - 10.5 K/uL Final  . RBC 08/09/2013 3.72* 3.87 - 5.11 MIL/uL Final  . Hemoglobin 08/09/2013 12.1  12.0 - 15.0 g/dL Final  . HCT 08/09/2013 37.0  36.0 - 46.0 % Final  . MCV 08/09/2013 99.5  78.0 - 100.0 fL Final  . MCH 08/09/2013 32.5  26.0 - 34.0 pg Final  . MCHC 08/09/2013 32.7  30.0 - 36.0 g/dL Final  . RDW 08/09/2013 12.3  11.5 - 15.5 % Final  . Platelets 08/09/2013 241  150 - 400 K/uL Final  . Neutrophils Relative % 08/09/2013 55  43 - 77 % Final  . Neutro Abs 08/09/2013 3.7  1.7 - 7.7 K/uL Final  . Lymphocytes Relative 08/09/2013 35  12 - 46 % Final  . Lymphs Abs 08/09/2013 2.4  0.7 - 4.0 K/uL Final  . Monocytes Relative 08/09/2013 6  3 - 12 % Final  . Monocytes Absolute 08/09/2013 0.4  0.1 - 1.0 K/uL Final  . Eosinophils Relative 08/09/2013 4  0 - 5 % Final  . Eosinophils Absolute 08/09/2013 0.3  0.0 - 0.7 K/uL Final  . Basophils Relative 08/09/2013 0  0 - 1 % Final  . Basophils Absolute 08/09/2013 0.0  0.0 - 0.1 K/uL Final  . Sodium 08/09/2013 140  135 - 145 mEq/L Final  . Potassium 08/09/2013 3.0* 3.5 - 5.1 mEq/L Final  . Chloride 08/09/2013 102  96 - 112 mEq/L Final  . CO2 08/09/2013 27  19 - 32 mEq/L Final  . Glucose, Bld 08/09/2013 99  70 - 99 mg/dL Final  . BUN 08/09/2013 17  6 - 23 mg/dL Final  . Creatinine, Ser 08/09/2013 0.75  0.50 - 1.10 mg/dL Final  . Calcium 08/09/2013 9.3  8.4 - 10.5 mg/dL Final  . Total Protein 08/09/2013 7.5  6.0  - 8.3 g/dL Final  . Albumin 08/09/2013 4.0  3.5 - 5.2 g/dL Final  . AST 08/09/2013 22  0 - 37 U/L Final  . ALT 08/09/2013 24  0 - 35 U/L Final  . Alkaline Phosphatase 08/09/2013 107  39 - 117 U/L Final  . Total Bilirubin 08/09/2013 0.2* 0.3 - 1.2 mg/dL Final  . GFR calc non Af Amer 08/09/2013 >90  >90 mL/min Final  . GFR calc Af Amer 08/09/2013 >90  >90 mL/min Final   Comment: (NOTE)                          The eGFR has been calculated using the CKD EPI equation.                          This calculation has not been validated in all clinical situations.                          eGFR's persistently <90 mL/min signify possible Chronic Kidney                          Disease.  . CEA 08/09/2013 <0.5  0.0 - 5.0 ng/mL Final   Performed at [facility]  . CA 27.29 08/09/2013 26  0 - 39 U/mL Final   Performed at [facility]  . Vit D, 25-Hydroxy 08/09/2013 30  30 - 89 ng/mL Final   Comment: (NOTE)                          This assay accurately quantifies Vitamin D, which is the sum of the                          25-Hydroxy forms of Vitamin D2 and D3.  Studies have shown that the                          optimum concentration of 25-Hydroxy Vitamin D is 30 ng/mL or higher.                           Concentrations of Vitamin D between 20 and 29 ng/mL are considered to                          be insufficient and concentrations less than 20 ng/mL are considered                          to be deficient for Vitamin D.                          Performed at [facility]: No new pathology.  Urinalysis No results found for this basename: colorurine,  appearanceur,  labspec,  phurine,  glucoseu,  hgbur,  bilirubinur,  ketonesur,  proteinur,  urobilinogen,  nitrite,  leukocytesur    RADIOGRAPHIC STUDIES: Nm Cardiac Muga Rest  08/13/2013   CLINICAL DATA:  Pre chemotherapy, breast cancer  EXAM: NUCLEAR MEDICINE CARDIAC BLOOD POOL IMAGING (MUGA)  TECHNIQUE:  Cardiac multi-gated acquisition was performed at rest following intravenous injection of Tc-88mlabeled red blood cells.  COMPARISON:  None.  RADIOPHARMACEUTICALS:  25MILLI CURIE ULTRATAG TECHNETIUM TC 45M-LABELED RED BLOOD CELLS IV [id number]-vitro labeled red blood cells.  FINDINGS: Resting left ventricular ejection fraction is calculated at 62%.  Grossly normal left ventricular wall motion.  IMPRESSION: 62% baseline ejection fraction.   Electronically Signed   By: TrDaryll Brod.D.   On: 08/13/2013 16:26   Dg Chest Portable 1 View  08/16/2013   CLINICAL DATA:  Postoperative from Port-A-Cath placement.  EXAM: PORTABLE CHEST - 1 VIEW  COMPARISON:  Chest x-ray of November 27, 2008.  FINDINGS: A Port-A-Cath appliance has been placed via the right subclavian approach with the tip of the catheter in the region of the proximal to mid SVC. There is no postprocedure pneumothorax or pneumothorax. The lungs are well-expanded. The interstitial markings are mildly prominent bilaterally. The cardiopericardial silhouette is normal in size. The pulmonary vascularity is not engorged. The patient has undergone previous left mastectomy and left axillary lymph node dissection.  IMPRESSION: There is no evidence of a postprocedure complication following placement of the Port-A-Cath appliance on the right.   Electronically Signed   By: David  JoMartinique On: 08/16/2013 12:16   Dg C-arm 1-60 Min-no Report  08/16/2013   CLINICAL DATA: port a cath placement   C-ARM 1-60 MINUTES  Fluoroscopy was utilized by the requesting physician.  No radiographic  interpretation.     ASSESSMENT:  #1. Stage II (T1 C. N1 M0) duct cell carcinoma of the left breast, ER/PR positive, HER-2/neu overexpressed, status post left modified radical mastectomy, good tolerance of cycle #1 of TCH plus Neulasta. #2. Barrett's esophagus, asymptomatic. #3. Gastroesophageal reflux disease, controlled. #4. Hypertension, controlled. #5. Anticipated  leukocytosis secondary to Neulasta.   PLAN:  #1. Followup in 2 weeks for cycle #2 and office visit. #2. Told to take analgesics if she does develop bone pain.   All questions were answered. The patient knows to call the clinic with any problems, questions or concerns. We can certainly see the patient much sooner if necessary.   I spent 25 minutes counseling the patient face to face. The total time spent in the appointment was 30 minutes.    FoDoroteo BradfordMD 09/04/2013 11:47 AM

## 2013-09-04 NOTE — Patient Instructions (Signed)
.  [facility] Discharge Instructions  RECOMMENDATIONS MADE BY THE CONSULTANT AND ANY TEST RESULTS WILL BE SENT TO YOUR REFERRING PHYSICIAN.  EXAM FINDINGS BY THE PHYSICIAN TODAY AND SIGNS OR SYMPTOMS TO REPORT TO CLINIC OR PRIMARY PHYSICIAN: Exam and findings as discussed by Dr. Barnet Glasgow.  Labs today  INSTRUCTIONS/FOLLOW-UP: Dr. Aletha Halim see you on next chemo visit.  Thank you for choosing [facility] to provide your oncology and hematology care.  To afford each patient quality time with our providers, please arrive at least 15 minutes before your scheduled appointment time.  With your help, our goal is to use those 15 minutes to complete the necessary work-up to ensure our physicians have the information they need to help with your evaluation and healthcare recommendations.    Effective January 1st, 2014, we ask that you re-schedule your appointment with our physicians should you arrive 10 or more minutes late for your appointment.  We strive to give you quality time with our providers, and arriving late affects you and other patients whose appointments are after yours.    Again, thank you for choosing [facility].  Our hope is that these requests will decrease the amount of time that you wait before being seen by our physicians.       _____________________________________________________________  Should you have questions after your visit to [facility], please contact our office at [phone] between the hours of 8:30 a.m. and 5:00 p.m.  Voicemails left after 4:30 p.m. will not be returned until the following business day.  For prescription refill requests, have your pharmacy contact our office with your prescription refill request.

## 2013-09-15 NOTE — Progress Notes (Addendum)
Heather Cheadle, PA-C [address] [place] [address]  Invasive ductal carcinoma metastasized to axillary lymph node, left  CURRENT THERAPY: S/P cycle 1 of TCH + Neulasta support beginning on 08/27/2013.  INTERVAL HISTORY: Heather Vaughan 58 y.o. female returns for  regular  visit for followup of Invasive ductal carcinoma of left breast with high grade DCIS.  S/P left partial mastectomy on 07/03/2013 demonstrating a 1.4 cm invasive component that was ER 74%, PR 16%, Ki-67 52%, and HER2 negative.  However, positive margins were noted and therefore on 07/17/2013 she underwent a left modified radical mastectomy and left axillary lymph node dissection.  Clear margins were ascertained with 5/63 positive lymph nodes positive for metastatic disease. Testing on the positive lymph node reveals HER2 positivity.  She was therefore started on [facility] therapy on 08/27/2013.    Invasive ductal carcinoma metastasized to axillary lymph node, left   05/28/2013 Imaging Screening mammogram   06/19/2013 Imaging Left diagnostic mammogram   06/25/2013 Imaging CT CAP- borderline enlarged axillary lymph node on left.  Small pulmonary nodules noted but too small for PET or biopsy; surveillance recommended.    07/03/2013 Surgery Left partial mastectomy.  Invasive ductal carcinoma. 1.4 cm.  POSITIVE MARGINS.  Additional extensive DCIS noted.  ER 74%, PR 16%, Ki-67 52%, HER2 NEGATIVE.   07/17/2013 Surgery Left modified radiacl mastectomy with left axillary node dissection.  Negative residual cancer.  Negative inked margins.  1/12 positive lymph nodes.  FINAL MARGINS NEGATIVE.  LYMPH NODE IS HER2 POSITIVE.   08/13/2013 Imaging MUGA scan shows left ventricular EF of 62%   08/27/2013 -  Chemotherapy TCH   I personally reviewed and went over laboratory results with the patient.  Results follow below.   I personally reviewed and went over radiographic studies with the patient.  CT scans prior to surgical  resection of tumor revealed:  1. Borderline enlarged left axillary lymph node.  2. Small noncalcified pulmonary nodules in the right lower and left lower lobes as above. Too small for biopsy or PET-CT. Surveillance recommended.  3. Cholelithiasis.  I have discussed this with Dr. Barnet Glasgow who believes these lung nodules are benign from her smoking history.  No surveillance CT scan is recommended at this time per Dr. Barnet Glasgow.  She tolerated her first cycle of chemotherapy well.  She notes fatigue for 2 days following chemotherapy.  She denies any nausea or vomiting.  She has lost her hair secondary to chemotherapy and has a beautiful wig in place that looks very natural for her.   She asks about working.  I explained to her that some people are able to work during chemotherapy, but most cannot.  She feels as though she may be able to work during chemotherapy due to her tolerance of cycle 1.  She was educated that as we progress through cycles, she may note increased side effects and toxicities.  She understands.  She is interested in working and I support that decision.  She will ascertain FMLA and I would be happy to fill those papers out for her.    Oncologically, she denies any complaints and ROS questioning is negative.   Past Medical History  Diagnosis Date  . GERD (gastroesophageal reflux disease)   . Hypertension   . Bronchitis 07/27/2011  . Barrett's esophagus   . Anxiety   . Breast cancer, left breast     has GERD (gastroesophageal reflux disease); Hypertension; Barrett esophagus; and Invasive ductal carcinoma metastasized to axillary lymph node,  left on her problem list.     has No Known Allergies.  [name] does not currently have medications on file.  Past Surgical History  Procedure Laterality Date  . Excision morton's neuroma  07/14/2011    Procedure: EXCISION MORTON'S NEUROMA;  Surgeon: Marcheta Grammes;  Location: [facility] OR[facility];  Service: Orthopedics;  Laterality:  Right;  Removal Neuroma 3rd Interspace Right Foot  . Esophagogastroduodenoscopy  08/03/2011    Procedure: ESOPHAGOGASTRODUODENOSCOPY (EGD);  Surgeon: Rogene Houston, MD;  Location: [facility] END[facility] SU[facility];  Service: Endoscopy;  Laterality: N/A;  12:45  . Partial mastectomy with needle localization Left 07/03/2013    Procedure: PARTIAL MASTECTOMY STATUS POST NEEDLE LOCALIZATION;  Surgeon: Scherry Ran, MD;  Location: [facility] ORS;  Service: General;  Laterality: Left;  . Breast biopsy Bilateral     X 4- benign  . Mastectomy modified radical Left 07/17/2013    Procedure: MASTECTOMY MODIFIED RADICAL WITH LEFT AXILLARY TISSUE REMOVAL;  Surgeon: Scherry Ran, MD;  Location: [facility] ORS;  Service: General;  Laterality: Left;  . Portacath placement Right 08/16/2013    Procedure: INSERTION PORT-A-CATH RIGHT SUBCLAVIAN;  Surgeon: Scherry Ran, MD;  Location: [facility];  Service: General;  Laterality: Right;    Denies any headaches, dizziness, double vision, fevers, chills, night sweats, nausea, vomiting, diarrhea, constipation, chest pain, heart palpitations, shortness of breath, blood in stool, black tarry stool, urinary pain, urinary burning, urinary frequency, hematuria.   PHYSICAL EXAMINATION  ECOG PERFORMANCE STATUS: 0 - Asymptomatic  There were no vitals filed for this visit.  GENERAL:alert, no distress, well nourished, well developed, comfortable, cooperative and smiling SKIN: skin color, texture, turgor are normal, no rashes or significant lesions HEAD: Normocephalic, No masses, lesions, tenderness or abnormalities EYES: normal, PERRLA, EOMI, Conjunctiva are pink and non-injected EARS: External ears normal OROPHARYNX:mucous membranes are moist  NECK: supple, trachea midline LYMPH:  not examined BREAST:not examined LUNGS: clear to auscultation  HEART: regular rate & rhythm, no murmurs, no gallops, S1 normal and S2 normal ABDOMEN:normal bowel sounds BACK: Back symmetric, no  curvature. EXTREMITIES:less then 2 second capillary refill, no joint deformities, effusion, or inflammation, no skin discoloration, no clubbing, no cyanosis  NEURO: alert & oriented x 3 with fluent speech, no focal motor/sensory deficits, gait normal   LABORATORY DATA: CBC    Component Value Date/Time   WBC 13.0* 09/16/2013 0915   RBC 3.14* 09/16/2013 0915   HGB 10.3* 09/16/2013 0915   HCT 31.3* 09/16/2013 0915   PLT 298 09/16/2013 0915   MCV 99.7 09/16/2013 0915   MCH 32.8 09/16/2013 0915   MCHC 32.9 09/16/2013 0915   RDW 12.5 09/16/2013 0915   LYMPHSABS 1.2 09/16/2013 0915   MONOABS 0.6 09/16/2013 0915   EOSABS 0.0 09/16/2013 0915   BASOSABS 0.0 09/16/2013 0915      Chemistry      Component Value Date/Time   NA 141 09/16/2013 0915   K 3.2* 09/16/2013 0915   CL 102 09/16/2013 0915   CO2 25 09/16/2013 0915   BUN 14 09/16/2013 0915   CREATININE 0.60 09/16/2013 0915      Component Value Date/Time   CALCIUM 9.7 09/16/2013 0915   ALKPHOS 89 09/16/2013 0915   AST 15 09/16/2013 0915   ALT 24 09/16/2013 0915   BILITOT 0.2* 09/16/2013 0915        PENDING LABS: CBC diff, CMET   RADIOGRAPHIC STUDIES:  06/25/2013  CLINICAL DATA: Breast carcinoma, left.  EXAM:  CT CHEST, ABDOMEN, AND PELVIS WITH  CONTRAST  TECHNIQUE:  Multidetector CT imaging of the chest, abdomen and pelvis was  performed following the standard protocol during bolus  administration of intravenous contrast.  CONTRAST: 198m OMNIPAQUE IOHEXOL 300 MG/ML SOLN  COMPARISON: None.  FINDINGS:  CT CHEST FINDINGS  16 mm soft tissue density central in the left breast. No pleural or  pericardial effusion. Subcentimeter bilateral hilar lymph nodes.  Subcentimeter AP window, pretracheal, and right peritracheal lymph  nodes. Several small subpleural nodules in the right lower lobe,  largest 6 mm (images 35 and 39/3). There is a 4 mm subpleural  noncalcified nodule in the posterior left lower lobe image 37. .Marland Kitchen Left axillary lymph  nodes up to 10 mm short axis diameter. Small  biapical subpleural blebs.).  CT ABDOMEN AND PELVIS FINDINGS  Unremarkable liver. Calcified stones in contracted gallbladder.  Unremarkable spleen, adrenal glands, pancreas, left kidney. Right  renal cysts, largest from the lower pole 2.2 cm. No hydronephrosis.  Patchy aortoiliac arterial calcifications without aneurysm. Small  hiatal hernia. Small bowel and colon are nondilated. Urinary bladder  incompletely distended. Uterus and adnexal regions unremarkable. No  ascites. No free air. No adenopathy. Portal vein patent. There is a  mild lumbar levoscoliosis apex L2-3. Degenerative disc disease L1-2.  Tarlov cysts in the pelvis. No worrisome lytic or sclerotic bone  lesions.  IMPRESSION:  1. Borderline enlarged left axillary lymph node.  2. Small noncalcified pulmonary nodules in the right lower and left  lower lobes as above. Too small for biopsy or PET-CT. Surveillance  recommended.  3. Cholelithiasis.  Electronically Signed  By: DArne ClevelandM.D.  On: 06/25/2013 23:32    ASSESSMENT:  1. 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  S/P left partial mastectomy on 07/03/2013 demonstrating a 1.4 cm invasive component that was ER 74%, PR 16%, Ki-67 52%, and HER2 negative.  However, positive margins were noted and therefore on 07/17/2013 she underwent a left modified radical mastectomy and left axillary lymph node dissection.  Clear margins were ascertained with 10/81positive lymph nodes positive for metastatic disease. Testing on the positive lymph node reveals HER2 positivity.  [facility]. 2. Tobacco abuse, 20 pack year smoking history 3. Pulmonary nodules, likely benign.  Future surveillance is recommended per radiologist reading report.   Patient Active Problem List   Diagnosis Date Noted  . Invasive ductal carcinoma metastasized to axillary lymph node, left 08/09/2013  .  Barrett esophagus 12/22/2011  . GERD (gastroesophageal reflux disease) 06/23/2011  . Hypertension 06/23/2011     PLAN:  1. I personally reviewed and went over laboratory results with the patient. 2. I personally reviewed and went over radiographic studies with the patient. 3. Cycle 2 today as scheduled if treatment parameters are met. 4. Pre-chemo labs today: CBC diff, CMET 5. Recommend FMLA  6. Increase Kdur to 20 mEq BID 7. Return in 3 weeks for follow-up.   THERAPY PLAN:  Wei's treatment plan is to complete 6 cycles of TCH with 52 weeks worth of Herceptin.  We will continue with symptom management, toxicity control, and quality of life issues while she undergoes systemic chemotherapy.  Following the completion of TC, she will undergo radiation therapy in addition to an Aromatase inhibitor.  All questions were answered. The patient knows to call the clinic with any problems, questions or concerns. We can certainly see the patient much sooner if necessary.  Patient and plan discussed with Dr. GFarrel Gobbleand  he is in agreement with the aforementioned.   Marcial Pless

## 2013-09-16 ENCOUNTER — Encounter (HOSPITAL_BASED_OUTPATIENT_CLINIC_OR_DEPARTMENT_OTHER): Payer: BC Managed Care – PPO | Admitting: Oncology

## 2013-09-16 ENCOUNTER — Encounter (HOSPITAL_BASED_OUTPATIENT_CLINIC_OR_DEPARTMENT_OTHER): Payer: BC Managed Care – PPO

## 2013-09-16 VITALS — BP 134/71 | HR 98 | Temp 98.2°F | Resp 18 | Wt 158.6 lb

## 2013-09-16 DIAGNOSIS — E876 Hypokalemia: Secondary | ICD-10-CM

## 2013-09-16 DIAGNOSIS — Z5111 Encounter for antineoplastic chemotherapy: Secondary | ICD-10-CM

## 2013-09-16 DIAGNOSIS — C773 Secondary and unspecified malignant neoplasm of axilla and upper limb lymph nodes: Secondary | ICD-10-CM

## 2013-09-16 DIAGNOSIS — R918 Other nonspecific abnormal finding of lung field: Secondary | ICD-10-CM

## 2013-09-16 DIAGNOSIS — Z5112 Encounter for antineoplastic immunotherapy: Secondary | ICD-10-CM

## 2013-09-16 DIAGNOSIS — C50919 Malignant neoplasm of unspecified site of unspecified female breast: Secondary | ICD-10-CM

## 2013-09-16 LAB — CBC WITH DIFFERENTIAL/PLATELET
Basophils Absolute: 0 10*3/uL (ref 0.0–0.1)
Basophils Relative: 0 % (ref 0–1)
Eosinophils Absolute: 0 10*3/uL (ref 0.0–0.7)
Eosinophils Relative: 0 % (ref 0–5)
HEMATOCRIT: 31.3 % — AB (ref 36.0–46.0)
Hemoglobin: 10.3 g/dL — ABNORMAL LOW (ref 12.0–15.0)
LYMPHS PCT: 9 % — AB (ref 12–46)
Lymphs Abs: 1.2 10*3/uL (ref 0.7–4.0)
MCH: 32.8 pg (ref 26.0–34.0)
MCHC: 32.9 g/dL (ref 30.0–36.0)
MCV: 99.7 fL (ref 78.0–100.0)
MONO ABS: 0.6 10*3/uL (ref 0.1–1.0)
MONOS PCT: 5 % (ref 3–12)
NEUTROS ABS: 11.2 10*3/uL — AB (ref 1.7–7.7)
Neutrophils Relative %: 86 % — ABNORMAL HIGH (ref 43–77)
Platelets: 298 10*3/uL (ref 150–400)
RBC: 3.14 MIL/uL — ABNORMAL LOW (ref 3.87–5.11)
RDW: 12.5 % (ref 11.5–15.5)
WBC: 13 10*3/uL — AB (ref 4.0–10.5)

## 2013-09-16 LAB — COMPREHENSIVE METABOLIC PANEL
ALT: 24 U/L (ref 0–35)
AST: 15 U/L (ref 0–37)
Albumin: 3.9 g/dL (ref 3.5–5.2)
Alkaline Phosphatase: 89 U/L (ref 39–117)
BILIRUBIN TOTAL: 0.2 mg/dL — AB (ref 0.3–1.2)
BUN: 14 mg/dL (ref 6–23)
CHLORIDE: 102 meq/L (ref 96–112)
CO2: 25 meq/L (ref 19–32)
CREATININE: 0.6 mg/dL (ref 0.50–1.10)
Calcium: 9.7 mg/dL (ref 8.4–10.5)
GFR calc Af Amer: >90 mL/min (ref >90)
GFR calc non Af Amer: >90 mL/min (ref >90)
Glucose, Bld: 118 mg/dL — ABNORMAL HIGH (ref 70–99)
Potassium: 3.2 mEq/L — ABNORMAL LOW (ref 3.7–5.3)
Sodium: 141 mEq/L (ref 137–147)
Total Protein: 7 g/dL (ref 6.0–8.3)

## 2013-09-16 MED ORDER — DOCETAXEL CHEMO INJECTION 160 MG/16ML
75.0000 mg/m2 | Freq: Once | INTRAVENOUS | Status: AC
  Administered 2013-09-16: 130 mg via INTRAVENOUS
  Filled 2013-09-16: qty 13

## 2013-09-16 MED ORDER — ACETAMINOPHEN 325 MG PO TABS
ORAL_TABLET | ORAL | Status: AC
  Filled 2013-09-16: qty 2

## 2013-09-16 MED ORDER — POTASSIUM CHLORIDE CRYS ER 20 MEQ PO TBCR: 20.0000 meq | EXTENDED_RELEASE_TABLET | Freq: Two times a day (BID) | ORAL | Status: DC

## 2013-09-16 MED ORDER — SODIUM CHLORIDE 0.9 % IV SOLN: 16.0000 mg | Freq: Once | INTRAVENOUS | Status: DC

## 2013-09-16 MED ORDER — SODIUM CHLORIDE 0.9 % IV SOLN
Freq: Once | INTRAVENOUS | Status: AC
  Administered 2013-09-16: 10:00:00 via INTRAVENOUS

## 2013-09-16 MED ORDER — SODIUM CHLORIDE 0.9 % IJ SOLN
10.0000 mL | INTRAMUSCULAR | Status: DC | PRN
  Administered 2013-09-16: 10 mL

## 2013-09-16 MED ORDER — DEXAMETHASONE SODIUM PHOSPHATE 10 MG/ML IJ SOLN: 20.0000 mg | Freq: Once | INTRAMUSCULAR | Status: DC

## 2013-09-16 MED ORDER — TRASTUZUMAB CHEMO INJECTION 440 MG
6.0000 mg/kg | Freq: Once | INTRAVENOUS | Status: AC
  Administered 2013-09-16: 420 mg via INTRAVENOUS
  Filled 2013-09-16: qty 20

## 2013-09-16 MED ORDER — ACETAMINOPHEN 325 MG PO TABS
650.0000 mg | ORAL_TABLET | Freq: Once | ORAL | Status: AC
  Administered 2013-09-16: 650 mg via ORAL

## 2013-09-16 MED ORDER — DIPHENHYDRAMINE HCL 25 MG PO CAPS
ORAL_CAPSULE | ORAL | Status: AC
  Filled 2013-09-16: qty 2

## 2013-09-16 MED ORDER — DIPHENHYDRAMINE HCL 25 MG PO CAPS
50.0000 mg | ORAL_CAPSULE | Freq: Once | ORAL | Status: AC
  Administered 2013-09-16: 50 mg via ORAL

## 2013-09-16 MED ORDER — HEPARIN SOD (PORK) LOCK FLUSH 100 UNIT/ML IV SOLN
500.0000 [IU] | Freq: Once | INTRAVENOUS | Status: AC | PRN
  Administered 2013-09-16: 500 [IU]

## 2013-09-16 MED ORDER — HEPARIN SOD (PORK) LOCK FLUSH 100 UNIT/ML IV SOLN
INTRAVENOUS | Status: AC
  Filled 2013-09-16: qty 5

## 2013-09-16 MED ORDER — SODIUM CHLORIDE 0.9 % IV SOLN
Freq: Once | INTRAVENOUS | Status: AC
  Administered 2013-09-16: 16 mg via INTRAVENOUS
  Filled 2013-09-16: qty 8

## 2013-09-16 MED ORDER — CARBOPLATIN CHEMO INJECTION 600 MG/60ML
570.0000 mg | Freq: Once | INTRAVENOUS | Status: AC
  Administered 2013-09-16: 570 mg via INTRAVENOUS
  Filled 2013-09-16: qty 57

## 2013-09-16 NOTE — Progress Notes (Signed)
Increase potassium to 20 meq 2 tablets daily per T.Kefalas,PA.

## 2013-09-16 NOTE — Addendum Note (Signed)
Addended by: Baird Cancer on: 09/16/2013 10:26 AM   Modules accepted: Orders

## 2013-09-17 ENCOUNTER — Encounter (HOSPITAL_BASED_OUTPATIENT_CLINIC_OR_DEPARTMENT_OTHER): Payer: BC Managed Care – PPO

## 2013-09-17 VITALS — BP 117/67 | HR 76 | Temp 98.3°F | Resp 18

## 2013-09-17 DIAGNOSIS — C773 Secondary and unspecified malignant neoplasm of axilla and upper limb lymph nodes: Secondary | ICD-10-CM

## 2013-09-17 DIAGNOSIS — C50919 Malignant neoplasm of unspecified site of unspecified female breast: Secondary | ICD-10-CM

## 2013-09-17 DIAGNOSIS — Z5189 Encounter for other specified aftercare: Secondary | ICD-10-CM

## 2013-09-17 MED ORDER — PEGFILGRASTIM INJECTION 6 MG/0.6ML
SUBCUTANEOUS | Status: AC
  Filled 2013-09-17: qty 0.6

## 2013-09-17 MED ORDER — PEGFILGRASTIM INJECTION 6 MG/0.6ML
6.0000 mg | Freq: Once | SUBCUTANEOUS | Status: AC
  Administered 2013-09-17: 6 mg via SUBCUTANEOUS

## 2013-09-17 NOTE — Progress Notes (Signed)
Heather Vaughan presents today for injection per MD orders. Neulasta 6mg administered SQ in left Abdomen. Administration without incident. Patient tolerated well.  

## 2013-09-19 ENCOUNTER — Telehealth (HOSPITAL_COMMUNITY): Payer: Self-pay | Admitting: Oncology

## 2013-09-19 NOTE — Telephone Encounter (Signed)
[employer] [phone] SPK W/GARY ?'D IF CPT [id number] REQUIRE AUTH PER GARY NO AUTH IS REQUIRED $40 COPAY IF OV BILLED W/CHEMO  $2000 OOP FOR PLAN CALL [phone]

## 2013-09-24 ENCOUNTER — Other Ambulatory Visit (HOSPITAL_COMMUNITY): Payer: Self-pay | Admitting: Oncology

## 2013-09-24 ENCOUNTER — Encounter (HOSPITAL_COMMUNITY): Payer: Self-pay | Admitting: Oncology

## 2013-09-30 ENCOUNTER — Other Ambulatory Visit (HOSPITAL_COMMUNITY): Payer: Self-pay | Admitting: Oncology

## 2013-09-30 ENCOUNTER — Other Ambulatory Visit (HOSPITAL_COMMUNITY): Payer: Self-pay | Admitting: *Deleted

## 2013-09-30 ENCOUNTER — Telehealth (HOSPITAL_COMMUNITY): Payer: Self-pay | Admitting: *Deleted

## 2013-09-30 DIAGNOSIS — C773 Secondary and unspecified malignant neoplasm of axilla and upper limb lymph nodes: Principal | ICD-10-CM

## 2013-09-30 DIAGNOSIS — C50919 Malignant neoplasm of unspecified site of unspecified female breast: Secondary | ICD-10-CM

## 2013-09-30 NOTE — Telephone Encounter (Signed)
Patient called to let us know that she was having some swelling in her left lower arm. Patient has not been seen by lymphedema clinic. Tom referred her to the PT department @ [facility] for lymphedema management. Patient notified that Tom PA-C didn't need to see her for this and that he was referring her for lymphedema management. She said ok. I called and left PT a voicemail @ extension [phone] letting them know that the referral had been made and to please see patient as soon as possible.

## 2013-10-08 ENCOUNTER — Ambulatory Visit (HOSPITAL_COMMUNITY): Payer: BC Managed Care – PPO | Admitting: Physical Therapy

## 2013-10-08 ENCOUNTER — Ambulatory Visit (HOSPITAL_COMMUNITY): Payer: BC Managed Care – PPO | Admitting: Oncology

## 2013-10-08 ENCOUNTER — Inpatient Hospital Stay (HOSPITAL_COMMUNITY): Payer: BC Managed Care – PPO

## 2013-10-08 ENCOUNTER — Ambulatory Visit (HOSPITAL_COMMUNITY): Payer: BC Managed Care – PPO

## 2013-10-09 ENCOUNTER — Inpatient Hospital Stay (HOSPITAL_COMMUNITY): Payer: BC Managed Care – PPO

## 2013-10-09 ENCOUNTER — Ambulatory Visit (HOSPITAL_COMMUNITY): Payer: BC Managed Care – PPO | Admitting: Oncology

## 2013-10-09 ENCOUNTER — Encounter (HOSPITAL_COMMUNITY): Payer: Self-pay

## 2013-10-09 ENCOUNTER — Ambulatory Visit (HOSPITAL_COMMUNITY): Payer: BC Managed Care – PPO

## 2013-10-09 ENCOUNTER — Encounter (HOSPITAL_COMMUNITY): Payer: BC Managed Care – PPO | Attending: Hematology and Oncology

## 2013-10-09 ENCOUNTER — Encounter (HOSPITAL_BASED_OUTPATIENT_CLINIC_OR_DEPARTMENT_OTHER): Payer: BC Managed Care – PPO

## 2013-10-09 VITALS — BP 132/69 | HR 86 | Temp 98.1°F | Resp 18 | Wt 157.6 lb

## 2013-10-09 DIAGNOSIS — C773 Secondary and unspecified malignant neoplasm of axilla and upper limb lymph nodes: Secondary | ICD-10-CM | POA: Insufficient documentation

## 2013-10-09 DIAGNOSIS — Z5112 Encounter for antineoplastic immunotherapy: Secondary | ICD-10-CM

## 2013-10-09 DIAGNOSIS — Z5111 Encounter for antineoplastic chemotherapy: Secondary | ICD-10-CM

## 2013-10-09 DIAGNOSIS — R609 Edema, unspecified: Secondary | ICD-10-CM

## 2013-10-09 DIAGNOSIS — C50919 Malignant neoplasm of unspecified site of unspecified female breast: Secondary | ICD-10-CM | POA: Insufficient documentation

## 2013-10-09 DIAGNOSIS — K2270 Barrett's esophagus without dysplasia: Secondary | ICD-10-CM

## 2013-10-09 DIAGNOSIS — G609 Hereditary and idiopathic neuropathy, unspecified: Secondary | ICD-10-CM

## 2013-10-09 LAB — COMPREHENSIVE METABOLIC PANEL
ALBUMIN: 3.7 g/dL (ref 3.5–5.2)
ALT: 25 U/L (ref 0–35)
AST: 20 U/L (ref 0–37)
Alkaline Phosphatase: 85 U/L (ref 39–117)
BILIRUBIN TOTAL: 0.2 mg/dL — AB (ref 0.3–1.2)
BUN: 15 mg/dL (ref 6–23)
CHLORIDE: 106 meq/L (ref 96–112)
CO2: 26 meq/L (ref 19–32)
CREATININE: 0.66 mg/dL (ref 0.50–1.10)
Calcium: 9.4 mg/dL (ref 8.4–10.5)
GFR calc Af Amer: >90 mL/min (ref >90)
Glucose, Bld: 95 mg/dL (ref 70–99)
Potassium: 3.9 mEq/L (ref 3.7–5.3)
Sodium: 144 mEq/L (ref 137–147)
Total Protein: 7 g/dL (ref 6.0–8.3)

## 2013-10-09 LAB — CBC WITH DIFFERENTIAL/PLATELET
Basophils Absolute: 0 10*3/uL (ref 0.0–0.1)
Basophils Relative: 0 % (ref 0–1)
Eosinophils Absolute: 0 10*3/uL (ref 0.0–0.7)
Eosinophils Relative: 0 % (ref 0–5)
HEMATOCRIT: 28 % — AB (ref 36.0–46.0)
HEMOGLOBIN: 9.4 g/dL — AB (ref 12.0–15.0)
Lymphocytes Relative: 11 % — ABNORMAL LOW (ref 12–46)
Lymphs Abs: 1.1 10*3/uL (ref 0.7–4.0)
MCH: 33.8 pg (ref 26.0–34.0)
MCHC: 33.6 g/dL (ref 30.0–36.0)
MCV: 100.7 fL — AB (ref 78.0–100.0)
MONO ABS: 0.9 10*3/uL (ref 0.1–1.0)
MONOS PCT: 10 % (ref 3–12)
NEUTROS ABS: 7.4 10*3/uL (ref 1.7–7.7)
Neutrophils Relative %: 79 % — ABNORMAL HIGH (ref 43–77)
Platelets: 282 10*3/uL (ref 150–400)
RBC: 2.78 MIL/uL — ABNORMAL LOW (ref 3.87–5.11)
RDW: 15.7 % — ABNORMAL HIGH (ref 11.5–15.5)
WBC: 9.3 10*3/uL (ref 4.0–10.5)

## 2013-10-09 MED ORDER — SODIUM CHLORIDE 0.9 % IV SOLN
Freq: Once | INTRAVENOUS | Status: AC
  Administered 2013-10-09: 11:00:00 via INTRAVENOUS

## 2013-10-09 MED ORDER — SODIUM CHLORIDE 0.9 % IJ SOLN
10.0000 mL | INTRAMUSCULAR | Status: DC | PRN
  Administered 2013-10-09: 10 mL

## 2013-10-09 MED ORDER — HEPARIN SOD (PORK) LOCK FLUSH 100 UNIT/ML IV SOLN
500.0000 [IU] | Freq: Once | INTRAVENOUS | Status: AC | PRN
  Administered 2013-10-09: 500 [IU]
  Filled 2013-10-09: qty 5

## 2013-10-09 MED ORDER — SODIUM CHLORIDE 0.9 % IV SOLN
570.0000 mg | Freq: Once | INTRAVENOUS | Status: AC
  Administered 2013-10-09: 570 mg via INTRAVENOUS
  Filled 2013-10-09: qty 57

## 2013-10-09 MED ORDER — SODIUM CHLORIDE 0.9 % IV SOLN: Freq: Once | INTRAVENOUS | Status: DC

## 2013-10-09 MED ORDER — SODIUM CHLORIDE 0.9 % IV SOLN
Freq: Once | INTRAVENOUS | Status: AC
  Administered 2013-10-09: 16 mg via INTRAVENOUS
  Filled 2013-10-09: qty 8

## 2013-10-09 MED ORDER — DIPHENHYDRAMINE HCL 25 MG PO CAPS
ORAL_CAPSULE | ORAL | Status: AC
  Filled 2013-10-09: qty 2

## 2013-10-09 MED ORDER — SODIUM CHLORIDE 0.9 % IV SOLN: 16.0000 mg | Freq: Once | INTRAVENOUS | Status: DC

## 2013-10-09 MED ORDER — ACETAMINOPHEN 325 MG PO TABS
650.0000 mg | ORAL_TABLET | Freq: Once | ORAL | Status: AC
  Administered 2013-10-09: 650 mg via ORAL

## 2013-10-09 MED ORDER — ACETAMINOPHEN 325 MG PO TABS
ORAL_TABLET | ORAL | Status: AC
  Filled 2013-10-09: qty 2

## 2013-10-09 MED ORDER — TRASTUZUMAB CHEMO INJECTION 440 MG
6.0000 mg/kg | Freq: Once | INTRAVENOUS | Status: AC
  Administered 2013-10-09: 420 mg via INTRAVENOUS
  Filled 2013-10-09: qty 20

## 2013-10-09 MED ORDER — DIPHENHYDRAMINE HCL 25 MG PO CAPS
50.0000 mg | ORAL_CAPSULE | Freq: Once | ORAL | Status: AC
  Administered 2013-10-09: 50 mg via ORAL

## 2013-10-09 MED ORDER — FUROSEMIDE 20 MG PO TABS: ORAL_TABLET | ORAL | Status: DC

## 2013-10-09 MED ORDER — DEXAMETHASONE SODIUM PHOSPHATE 10 MG/ML IJ SOLN: 20.0000 mg | Freq: Once | INTRAMUSCULAR | Status: DC

## 2013-10-09 MED ORDER — DOCETAXEL CHEMO INJECTION 160 MG/16ML
75.0000 mg/m2 | Freq: Once | INTRAVENOUS | Status: AC
  Administered 2013-10-09: 130 mg via INTRAVENOUS
  Filled 2013-10-09: qty 13

## 2013-10-09 NOTE — Progress Notes (Signed)
Tolerated well

## 2013-10-09 NOTE — Progress Notes (Signed)
[facility]  OFFICE PROGRESS NOTE  [name], PA-C [address] [place] [address]  DIAGNOSIS: Breast cancer metastasized to axillary lymph node  Barrett esophagus  Chief Complaint  Patient presents with  . Adjuvant therapy for HER-2 positive breast cancer    Cycle #2 of [place] today    CURRENT THERAPY: [place] chemotherapy for cycle #3 today with Neulasta support.  INTERVAL HISTORY: Heather Vaughan 58 y.o. female returns for administration of second cycle of [facility] chemotherapy for node positive, HER-2 positive duct cell carcinoma of breast. For cycle was given on 08/27/2013. She has experienced some lower extremity dysesthesia involving the soles. She denies any nausea, vomiting, but does have lower extremity swelling without chest pain, PND, or orthopnea. She denies any lymphedema, sore throat, sore mouth, fever, night sweats, vaginal bleeding or discharge, melena, hematochezia, hematuria, or incontinence.   MEDICAL HISTORY: Past Medical History  Diagnosis Date  . GERD (gastroesophageal reflux disease)   . Hypertension   . Bronchitis 07/27/2011  . Barrett's esophagus   . Anxiety   . Breast cancer, left breast     INTERIM HISTORY: has GERD (gastroesophageal reflux disease); Hypertension; Barrett esophagus; and Invasive ductal carcinoma metastasized to axillary lymph node, left on her problem list.   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 S/P left partial mastectomy on 07/03/2013 demonstrating a 1.4 cm invasive component that was ER 74%, PR 16%, Ki-67 52%, and HER2 negative. However, positive margins were noted and therefore on 07/17/2013 she underwent a left modified radical mastectomy and left axillary lymph node dissection. Clear margins were ascertained with 1/02 positive lymph nodes positive for metastatic disease. Testing on the positive lymph node reveals HER2 positivity. She was therefore  started on [facility] therapy on 08/27/2013.  ALLERGIES:  has No Known Allergies.  MEDICATIONS: has a current medication list which includes the following prescription(s): acetaminophen, carboplatin, dexamethasone, docetaxel, duloxetine, furosemide, lidocaine-prilocaine, losartan-hydrochlorothiazide, metoclopramide, nexium, pegfilgrastim, potassium chloride sa, prochlorperazine, trastuzumab, and trazodone, and the following Facility-Administered Medications: sodium chloride, sodium chloride, DOCEtaxel (TAXOTERE) 130 mg in dextrose 5 % 250 mL chemo infusion, heparin lock flush, ondansetron (ZOFRAN) 16 mg, dexamethasone (DECADRON) 20 mg in sodium chloride 0.9 % 50 mL IVPB, sodium chloride, and trastuzumab (HERCEPTIN) 420 mg in sodium chloride 0.9 % 250 mL chemo infusion.  SURGICAL HISTORY:  Past Surgical History  Procedure Laterality Date  . Excision morton's neuroma  07/14/2011    Procedure: EXCISION MORTON'S NEUROMA;  Surgeon: Marcheta Grammes;  Location: [facility];  Service: Orthopedics;  Laterality: Right;  Removal Neuroma 3rd Interspace Right Foot  . Esophagogastroduodenoscopy  08/03/2011    Procedure: ESOPHAGOGASTRODUODENOSCOPY (EGD);  Surgeon: Rogene Houston, MD;  Location: [facility] END[facility];  Service: Endoscopy;  Laterality: N/A;  12:45  . Partial mastectomy with needle localization Left 07/03/2013    Procedure: PARTIAL MASTECTOMY STATUS POST NEEDLE LOCALIZATION;  Surgeon: Scherry Ran, MD;  Location: [facility] OR[facility];  Service: General;  Laterality: Left;  . Breast biopsy Bilateral     X 4- benign  . Mastectomy modified radical Left 07/17/2013    Procedure: MASTECTOMY MODIFIED RADICAL WITH LEFT AXILLARY TISSUE REMOVAL;  Surgeon: Scherry Ran, MD;  Location: [facility];  Service: General;  Laterality: Left;  . Portacath placement Right 08/16/2013    Procedure: INSERTION PORT-A-CATH RIGHT SUBCLAVIAN;  Surgeon: Scherry Ran, MD;  Location: [facility];  Service: General;  Laterality: Right;     FAMILY  HISTORY: family history is negative for Anesthesia problems, Hypotension, Malignant hyperthermia, and Pseudochol deficiency.  SOCIAL HISTORY:  reports that she quit smoking about 20 years ago. Her smoking use included Cigarettes. She has a 20 pack-year smoking history. She does not have any smokeless tobacco history on file. She reports that she does not drink alcohol or use illicit drugs.  REVIEW OF SYSTEMS:  Other than that discussed above is noncontributory.  PHYSICAL EXAMINATION: ECOG PERFORMANCE STATUS: 1 - Symptomatic but completely ambulatory  There were no vitals taken for this visit.  GENERAL:alert, no distress and comfortable. Alopecia the scalp. SKIN: skin color, texture, turgor are normal, no rashes or significant lesions EYES: PERLA; Conjunctiva are pink and non-injected, sclera clear OROPHARYNX:no exudate, no erythema on lips, buccal mucosa, or tongue. NECK: supple, thyroid normal size, non-tender, without nodularity. No masses CHEST: Status post left mastectomy with no subcutaneous nodules. Right breast without mass. LYMPH:  no palpable lymphadenopathy in the cervical, axillary or inguinal LUNGS: clear to auscultation and percussion with normal breathing effort HEART: regular rate & rhythm and no murmurs. ABDOMEN:abdomen soft, non-tender and normal bowel sounds MUSCULOSKELETAL:no cyanosis of digits and no clubbing. Range of motion normal. No lymphedema. NEURO: alert & oriented x 3 with fluent speech, no focal motor/sensory deficits   LABORATORY DATA: Infusion on 10/09/2013  Component Date Value Ref Range Status  . WBC 10/09/2013 9.3  4.0 - 10.5 K/uL Final  . RBC 10/09/2013 2.78* 3.87 - 5.11 MIL/uL Final  . Hemoglobin 10/09/2013 9.4* 12.0 - 15.0 g/dL Final  . HCT 10/09/2013 28.0* 36.0 - 46.0 % Final  . MCV 10/09/2013 100.7* 78.0 - 100.0 fL Final  . MCH 10/09/2013 33.8  26.0 - 34.0 pg Final  . MCHC 10/09/2013 33.6  30.0 - 36.0 g/dL Final  . RDW 10/09/2013  15.7* 11.5 - 15.5 % Final  . Platelets 10/09/2013 282  150 - 400 K/uL Final  . Neutrophils Relative % 10/09/2013 79* 43 - 77 % Final  . Neutro Abs 10/09/2013 7.4  1.7 - 7.7 K/uL Final  . Lymphocytes Relative 10/09/2013 11* 12 - 46 % Final  . Lymphs Abs 10/09/2013 1.1  0.7 - 4.0 K/uL Final  . Monocytes Relative 10/09/2013 10  3 - 12 % Final  . Monocytes Absolute 10/09/2013 0.9  0.1 - 1.0 K/uL Final  . Eosinophils Relative 10/09/2013 0  0 - 5 % Final  . Eosinophils Absolute 10/09/2013 0.0  0.0 - 0.7 K/uL Final  . Basophils Relative 10/09/2013 0  0 - 1 % Final  . Basophils Absolute 10/09/2013 0.0  0.0 - 0.1 K/uL Final  . Sodium 10/09/2013 144  137 - 147 mEq/L Final  . Potassium 10/09/2013 3.9  3.7 - 5.3 mEq/L Final  . Chloride 10/09/2013 106  96 - 112 mEq/L Final  . CO2 10/09/2013 26  19 - 32 mEq/L Final  . Glucose, Bld 10/09/2013 95  70 - 99 mg/dL Final  . BUN 10/09/2013 15  6 - 23 mg/dL Final  . Creatinine, Ser 10/09/2013 0.66  0.50 - 1.10 mg/dL Final  . Calcium 10/09/2013 9.4  8.4 - 10.5 mg/dL Final  . Total Protein 10/09/2013 7.0  6.0 - 8.3 g/dL Final  . Albumin 10/09/2013 3.7  3.5 - 5.2 g/dL Final  . AST 10/09/2013 20  0 - 37 U/L Final  . ALT 10/09/2013 25  0 - 35 U/L Final  . Alkaline Phosphatase 10/09/2013 85  39 - 117 U/L Final  . Total Bilirubin 10/09/2013 0.2*  0.3 - 1.2 mg/dL Final  . GFR calc non Af Amer 10/09/2013 >90  >90 mL/min Final  . GFR calc Af Amer 10/09/2013 >90  >90 mL/min Final   Comment: (NOTE)                          The eGFR has been calculated using the CKD EPI equation.                          This calculation has not been validated in all clinical situations.                          eGFR's persistently <90 mL/min signify possible Chronic Kidney                          Disease.  Infusion on 09/16/2013  Component Date Value Ref Range Status  . WBC 09/16/2013 13.0* 4.0 - 10.5 K/uL Final  . RBC 09/16/2013 3.14* 3.87 - 5.11 MIL/uL Final  . Hemoglobin  09/16/2013 10.3* 12.0 - 15.0 g/dL Final  . HCT 09/16/2013 31.3* 36.0 - 46.0 % Final  . MCV 09/16/2013 99.7  78.0 - 100.0 fL Final  . MCH 09/16/2013 32.8  26.0 - 34.0 pg Final  . MCHC 09/16/2013 32.9  30.0 - 36.0 g/dL Final  . RDW 09/16/2013 12.5  11.5 - 15.5 % Final  . Platelets 09/16/2013 298  150 - 400 K/uL Final  . Neutrophils Relative % 09/16/2013 86* 43 - 77 % Final  . Neutro Abs 09/16/2013 11.2* 1.7 - 7.7 K/uL Final  . Lymphocytes Relative 09/16/2013 9* 12 - 46 % Final  . Lymphs Abs 09/16/2013 1.2  0.7 - 4.0 K/uL Final  . Monocytes Relative 09/16/2013 5  3 - 12 % Final  . Monocytes Absolute 09/16/2013 0.6  0.1 - 1.0 K/uL Final  . Eosinophils Relative 09/16/2013 0  0 - 5 % Final  . Eosinophils Absolute 09/16/2013 0.0  0.0 - 0.7 K/uL Final  . Basophils Relative 09/16/2013 0  0 - 1 % Final  . Basophils Absolute 09/16/2013 0.0  0.0 - 0.1 K/uL Final  . Sodium 09/16/2013 141  137 - 147 mEq/L Final  . Potassium 09/16/2013 3.2* 3.7 - 5.3 mEq/L Final  . Chloride 09/16/2013 102  96 - 112 mEq/L Final  . CO2 09/16/2013 25  19 - 32 mEq/L Final  . Glucose, Bld 09/16/2013 118* 70 - 99 mg/dL Final  . BUN 09/16/2013 14  6 - 23 mg/dL Final  . Creatinine, Ser 09/16/2013 0.60  0.50 - 1.10 mg/dL Final  . Calcium 09/16/2013 9.7  8.4 - 10.5 mg/dL Final  . Total Protein 09/16/2013 7.0  6.0 - 8.3 g/dL Final  . Albumin 09/16/2013 3.9  3.5 - 5.2 g/dL Final  . AST 09/16/2013 15  0 - 37 U/L Final  . ALT 09/16/2013 24  0 - 35 U/L Final  . Alkaline Phosphatase 09/16/2013 89  39 - 117 U/L Final  . Total Bilirubin 09/16/2013 0.2* 0.3 - 1.2 mg/dL Final  . GFR calc non Af Amer 09/16/2013 >90  >90 mL/min Final  . GFR calc Af Amer 09/16/2013 >90  >90 mL/min Final   Comment: (NOTE)                          The eGFR  has been calculated using the CKD EPI equation.                          This calculation has not been validated in all clinical situations.                          eGFR's persistently <90 mL/min  signify possible Chronic Kidney                          Disease.    PATHOLOGY: No new pathology.  Urinalysis No results found for this basename: colorurine,  appearanceur,  labspec,  phurine,  glucoseu,  hgbur,  bilirubinur,  ketonesur,  proteinur,  urobilinogen,  nitrite,  leukocytesur    RADIOGRAPHIC STUDIES: No results found.  ASSESSMENT:  #1.Invasive ductal carcinoma of left breast with high grade DCIS. S/P left partial mastectomy on 07/03/2013 demonstrating a 1.4 cm invasive component that was ER 74%, PR 16%, Ki-67 52%, and HER2 negative. However, positive margins were noted and therefore on 07/17/2013 she underwent a left modified radical mastectomy and left axillary lymph node dissection. Clear margins were ascertained with 2/10 positive lymph nodes positive for metastatic disease. Testing on the positive lymph node reveals HER2 positivity. She was therefore started on [facility] therapy on 08/27/2013. She appears to have developed lower 70 edema with minimal peripheral neuropathy. #2. Barrett's esophagus, asymptomatic. #3. Pulmonary nodules, most likely benign.    PLAN:  #1. Trial of glutamine 1 g twice a day to try to allay worsening neuropathy. #2. Furosemide 20 mg daily for for 5 days to control lower extremity swelling. #3. Followup in 3 weeks for cycle #4 of [place] of a planned 6.   All questions were answered. The patient knows to call the clinic with any problems, questions or concerns. We can certainly see the patient much sooner if necessary.   I spent 25 minutes counseling the patient face to face. The total time spent in the appointment was 30 minutes.    Doroteo Bradford, MD 10/09/2013 11:30 AM

## 2013-10-09 NOTE — Patient Instructions (Signed)
[place] Discharge Instructions  RECOMMENDATIONS MADE BY THE CONSULTANT AND ANY TEST RESULTS WILL BE SENT TO YOUR REFERRING PHYSICIAN.  EXAM FINDINGS BY THE PHYSICIAN TODAY AND SIGNS OR SYMPTOMS TO REPORT TO CLINIC OR PRIMARY PHYSICIAN: Exam and findings as discussed by Dr. Barnet Glasgow.  Trial of glutamine 1 g twice a day to try to allay worsening neuropathy.  Furosemide 20 mg daily for for 5 days to control lower extremity swelling.  Follow-up in 3 weeks for cycle #4 of [place] of a planned 6.      Thank you for choosing [place] to provide your oncology and hematology care.  To afford each patient quality time with our providers, please arrive at least 15 minutes before your scheduled appointment time.  With your help, our goal is to use those 15 minutes to complete the necessary work-up to ensure our physicians have the information they need to help with your evaluation and healthcare recommendations.    Effective January 1st, 2014, we ask that you re-schedule your appointment with our physicians should you arrive 10 or more minutes late for your appointment.  We strive to give you quality time with our providers, and arriving late affects you and other patients whose appointments are after yours.    Again, thank you for choosing [facility].  Our hope is that these requests will decrease the amount of time that you wait before being seen by our physicians.       _____________________________________________________________  Should you have questions after your visit to [facility], please contact our office at [phone] between the hours of 8:30 a.m. and 5:00 p.m.  Voicemails left after 4:30 p.m. will not be returned until the following business day.  For prescription refill requests, have your pharmacy contact our office with your prescription refill request.

## 2013-10-10 ENCOUNTER — Encounter (HOSPITAL_BASED_OUTPATIENT_CLINIC_OR_DEPARTMENT_OTHER): Payer: BC Managed Care – PPO

## 2013-10-10 ENCOUNTER — Ambulatory Visit (HOSPITAL_COMMUNITY): Payer: BC Managed Care – PPO

## 2013-10-10 DIAGNOSIS — C773 Secondary and unspecified malignant neoplasm of axilla and upper limb lymph nodes: Secondary | ICD-10-CM

## 2013-10-10 DIAGNOSIS — Z5189 Encounter for other specified aftercare: Secondary | ICD-10-CM

## 2013-10-10 DIAGNOSIS — C50919 Malignant neoplasm of unspecified site of unspecified female breast: Secondary | ICD-10-CM

## 2013-10-10 MED ORDER — PEGFILGRASTIM INJECTION 6 MG/0.6ML
SUBCUTANEOUS | Status: AC
  Filled 2013-10-10: qty 0.6

## 2013-10-10 MED ORDER — PEGFILGRASTIM INJECTION 6 MG/0.6ML
6.0000 mg | Freq: Once | SUBCUTANEOUS | Status: AC
  Administered 2013-10-10: 6 mg via SUBCUTANEOUS

## 2013-10-10 NOTE — Progress Notes (Signed)
Heather Vaughan presents today for injection per MD orders. Neulasta 6mg administered SQ in left Abdomen. Administration without incident. Patient tolerated well.  

## 2013-10-18 ENCOUNTER — Telehealth (HOSPITAL_COMMUNITY): Payer: Self-pay

## 2013-10-18 ENCOUNTER — Ambulatory Visit (HOSPITAL_COMMUNITY): Payer: BC Managed Care – PPO | Admitting: Physical Therapy

## 2013-10-23 ENCOUNTER — Other Ambulatory Visit (HOSPITAL_COMMUNITY): Payer: Self-pay | Admitting: Oncology

## 2013-10-23 DIAGNOSIS — C50919 Malignant neoplasm of unspecified site of unspecified female breast: Secondary | ICD-10-CM

## 2013-10-23 DIAGNOSIS — C773 Secondary and unspecified malignant neoplasm of axilla and upper limb lymph nodes: Principal | ICD-10-CM

## 2013-10-25 ENCOUNTER — Telehealth (HOSPITAL_COMMUNITY): Payer: Self-pay | Admitting: *Deleted

## 2013-10-25 ENCOUNTER — Other Ambulatory Visit (HOSPITAL_COMMUNITY): Payer: Self-pay | Admitting: Hematology and Oncology

## 2013-10-25 MED ORDER — GABAPENTIN 300 MG PO CAPS: ORAL_CAPSULE | ORAL | Status: DC

## 2013-10-25 NOTE — Telephone Encounter (Signed)
Patient complains of swelling in ankles. She is on the Lasix daily that you prescribed for her. Patient states that she is hurting @ a #8-9 and worse with standing. She is @ work today. Any suggestions? She states that this pain is horrible.

## 2013-10-25 NOTE — Telephone Encounter (Signed)
Pt notified of Dr. Idamae Lusher recommendations regarding Lasix (increase to 2 tablets = 40mg  daily) and to start taking Gabapentin four times a day. She said ok.

## 2013-10-29 ENCOUNTER — Encounter (HOSPITAL_COMMUNITY): Payer: Self-pay

## 2013-10-29 ENCOUNTER — Encounter (HOSPITAL_COMMUNITY): Payer: BC Managed Care – PPO | Attending: Hematology and Oncology

## 2013-10-29 VITALS — BP 130/84 | HR 110 | Temp 98.5°F | Resp 18 | Wt 158.9 lb

## 2013-10-29 DIAGNOSIS — D6481 Anemia due to antineoplastic chemotherapy: Secondary | ICD-10-CM | POA: Insufficient documentation

## 2013-10-29 DIAGNOSIS — T451X5A Adverse effect of antineoplastic and immunosuppressive drugs, initial encounter: Secondary | ICD-10-CM

## 2013-10-29 DIAGNOSIS — K2270 Barrett's esophagus without dysplasia: Secondary | ICD-10-CM

## 2013-10-29 DIAGNOSIS — C773 Secondary and unspecified malignant neoplasm of axilla and upper limb lymph nodes: Secondary | ICD-10-CM

## 2013-10-29 DIAGNOSIS — R609 Edema, unspecified: Secondary | ICD-10-CM

## 2013-10-29 DIAGNOSIS — C50919 Malignant neoplasm of unspecified site of unspecified female breast: Secondary | ICD-10-CM | POA: Insufficient documentation

## 2013-10-29 DIAGNOSIS — R600 Localized edema: Secondary | ICD-10-CM

## 2013-10-29 NOTE — Patient Instructions (Signed)
[place] Discharge Instructions  RECOMMENDATIONS MADE BY THE CONSULTANT AND ANY TEST RESULTS WILL BE SENT TO YOUR REFERRING PHYSICIAN.  We will see you as scheduled on Friday. Neurontin 300mg  in the morning and 300mg  at night.    Thank you for choosing [place] to provide your oncology and hematology care.  To afford each patient quality time with our providers, please arrive at least 15 minutes before your scheduled appointment time.  With your help, our goal is to use those 15 minutes to complete the necessary work-up to ensure our physicians have the information they need to help with your evaluation and healthcare recommendations.    Effective January 1st, 2014, we ask that you re-schedule your appointment with our physicians should you arrive 10 or more minutes late for your appointment.  We strive to give you quality time with our providers, and arriving late affects you and other patients whose appointments are after yours.    Again, thank you for choosing [facility].  Our hope is that these requests will decrease the amount of time that you wait before being seen by our physicians.       _____________________________________________________________  Should you have questions after your visit to [facility], please contact our office at [phone] between the hours of 8:30 a.m. and 5:00 p.m.  Voicemails left after 4:30 p.m. will not be returned until the following business day.  For prescription refill requests, have your pharmacy contact our office with your prescription refill request.

## 2013-10-29 NOTE — Progress Notes (Signed)
[place]  OFFICE PROGRESS NOTE  [name], PA-C [address] [place] [address]  DIAGNOSIS: Breast cancer metastasized to axillary lymph node  Barrett esophagus  Edema, lower extremity  Chief Complaint  Patient presents with  . Breast Cancer    CURRENT THERAPY: [facility] chemotherapy adjuvantly for HER-2 positive breast cancer due for cycle #3 of therapy on 10/30/2013.  INTERVAL HISTORY: Heather Vaughan 58 y.o. female returns for followup in anticipation of receiving cycle #4 of chemotherapy on 11/01/2013. She works from 7 AM to 7 PM 2 days on and 2 days off. She develop lower 70 swelling towards the end of a work shift and did express some side effect of drowsiness while taking gabapentin. She denies any upper extremity swelling, fever, night sweats, cough, wheezing, lower extremity swelling or redness or worsening lower extremity paresthesias. She has had no paresthesias of the upper extremity.   MEDICAL HISTORY: Past Medical History  Diagnosis Date  . GERD (gastroesophageal reflux disease)   . Hypertension   . Bronchitis 07/27/2011  . Barrett's esophagus   . Anxiety   . Breast cancer, left breast     INTERIM HISTORY: has GERD (gastroesophageal reflux disease); Hypertension; Barrett esophagus; and Invasive ductal carcinoma metastasized to axillary lymph node, left on her problem list.   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 S/P left partial mastectomy on 07/03/2013 demonstrating a 1.4 cm invasive component that was ER 74%, PR 16%, Ki-67 52%, and HER2 negative. However, positive margins were noted and therefore on 07/17/2013 she underwent a left modified radical mastectomy and left axillary lymph node dissection. Clear margins were ascertained with 0/03 positive lymph nodes positive for metastatic disease. Testing on the positive lymph node reveals HER2 positivity. She was therefore started on [facility]  therapy on 08/27/2013  ALLERGIES:  has No Known Allergies.  MEDICATIONS: has a current medication list which includes the following prescription(s): acetaminophen, carboplatin, dexamethasone, docetaxel, duloxetine, furosemide, gabapentin, lidocaine-prilocaine, losartan-hydrochlorothiazide, metoclopramide, nexium, pegfilgrastim, prochlorperazine, trastuzumab, trazodone, and potassium chloride sa.  SURGICAL HISTORY:  Past Surgical History  Procedure Laterality Date  . Excision morton's neuroma  07/14/2011    Procedure: EXCISION MORTON'S NEUROMA;  Surgeon: Marcheta Grammes;  Location: [facility];  Service: Orthopedics;  Laterality: Right;  Removal Neuroma 3rd Interspace Right Foot  . Esophagogastroduodenoscopy  08/03/2011    Procedure: ESOPHAGOGASTRODUODENOSCOPY (EGD);  Surgeon: Rogene Houston, MD;  Location: [facility];  Service: Endoscopy;  Laterality: N/A;  12:45  . Partial mastectomy with needle localization Left 07/03/2013    Procedure: PARTIAL MASTECTOMY STATUS POST NEEDLE LOCALIZATION;  Surgeon: Scherry Ran, MD;  Location: [facility];  Service: General;  Laterality: Left;  . Breast biopsy Bilateral     X 4- benign  . Mastectomy modified radical Left 07/17/2013    Procedure: MASTECTOMY MODIFIED RADICAL WITH LEFT AXILLARY TISSUE REMOVAL;  Surgeon: Scherry Ran, MD;  Location: [facility] OR[facility];  Service: General;  Laterality: Left;  . Portacath placement Right 08/16/2013    Procedure: INSERTION PORT-A-CATH RIGHT SUBCLAVIAN;  Surgeon: Scherry Ran, MD;  Location: [facility];  Service: General;  Laterality: Right;    FAMILY HISTORY: family history is negative for Anesthesia problems, Hypotension, Malignant hyperthermia, and Pseudochol deficiency.  SOCIAL HISTORY:  reports that she quit smoking about 20 years ago. Her smoking use included Cigarettes. She has a 20 pack-year smoking history. She does not have any smokeless tobacco history on file.  She reports that she does not drink alcohol  or use illicit drugs.  REVIEW OF SYSTEMS:  Other than that discussed above is noncontributory.  PHYSICAL EXAMINATION: ECOG PERFORMANCE STATUS: 1 - Symptomatic but completely ambulatory  Blood pressure 130/84, pulse 110, temperature 98.5 F (36.9 C), temperature source Oral, resp. rate 18, weight 158 lb 14.4 oz (72.077 kg).  GENERAL:alert, no distress and comfortable SKIN: skin color, texture, turgor are normal, no rashes or significant lesions EYES: PERLA; Conjunctiva are pink and non-injected, sclera clear OROPHARYNX:no exudate, no erythema on lips, buccal mucosa, or tongue. NECK: supple, thyroid normal size, non-tender, without nodularity. No masses CHEST: Status post left mastectomy with no subcutaneous nodules. LYMPH:  no palpable lymphadenopathy in the cervical, axillary or inguinal LUNGS: clear to auscultation and percussion with normal breathing effort HEART: regular rate & rhythm and no murmurs. ABDOMEN:abdomen soft, non-tender and normal bowel sounds MUSCULOSKELETAL:no cyanosis of digits and no clubbing. Range of motion normal.  NEURO: alert & oriented x 3 with fluent speech, no focal motor/sensory deficits   LABORATORY DATA: Infusion on 10/09/2013  Component Date Value Ref Range Status  . WBC 10/09/2013 9.3  4.0 - 10.5 K/uL Final  . RBC 10/09/2013 2.78* 3.87 - 5.11 MIL/uL Final  . Hemoglobin 10/09/2013 9.4* 12.0 - 15.0 g/dL Final  . HCT 10/09/2013 28.0* 36.0 - 46.0 % Final  . MCV 10/09/2013 100.7* 78.0 - 100.0 fL Final  . MCH 10/09/2013 33.8  26.0 - 34.0 pg Final  . MCHC 10/09/2013 33.6  30.0 - 36.0 g/dL Final  . RDW 10/09/2013 15.7* 11.5 - 15.5 % Final  . Platelets 10/09/2013 282  150 - 400 K/uL Final  . Neutrophils Relative % 10/09/2013 79* 43 - 77 % Final  . Neutro Abs 10/09/2013 7.4  1.7 - 7.7 K/uL Final  . Lymphocytes Relative 10/09/2013 11* 12 - 46 % Final  . Lymphs Abs 10/09/2013 1.1  0.7 - 4.0 K/uL Final  . Monocytes Relative 10/09/2013 10  3 - 12 % Final  .  Monocytes Absolute 10/09/2013 0.9  0.1 - 1.0 K/uL Final  . Eosinophils Relative 10/09/2013 0  0 - 5 % Final  . Eosinophils Absolute 10/09/2013 0.0  0.0 - 0.7 K/uL Final  . Basophils Relative 10/09/2013 0  0 - 1 % Final  . Basophils Absolute 10/09/2013 0.0  0.0 - 0.1 K/uL Final  . Sodium 10/09/2013 144  137 - 147 mEq/L Final  . Potassium 10/09/2013 3.9  3.7 - 5.3 mEq/L Final  . Chloride 10/09/2013 106  96 - 112 mEq/L Final  . CO2 10/09/2013 26  19 - 32 mEq/L Final  . Glucose, Bld 10/09/2013 95  70 - 99 mg/dL Final  . BUN 10/09/2013 15  6 - 23 mg/dL Final  . Creatinine, Ser 10/09/2013 0.66  0.50 - 1.10 mg/dL Final  . Calcium 10/09/2013 9.4  8.4 - 10.5 mg/dL Final  . Total Protein 10/09/2013 7.0  6.0 - 8.3 g/dL Final  . Albumin 10/09/2013 3.7  3.5 - 5.2 g/dL Final  . AST 10/09/2013 20  0 - 37 U/L Final  . ALT 10/09/2013 25  0 - 35 U/L Final  . Alkaline Phosphatase 10/09/2013 85  39 - 117 U/L Final  . Total Bilirubin 10/09/2013 0.2* 0.3 - 1.2 mg/dL Final  . GFR calc non Af Amer 10/09/2013 >90  >90 mL/min Final  . GFR calc Af Amer 10/09/2013 >90  >90 mL/min Final   Comment: (NOTE)  The eGFR has been calculated using the CKD EPI equation.                          This calculation has not been validated in all clinical situations.                          eGFR's persistently <90 mL/min signify possible Chronic Kidney                          Disease.    PATHOLOGY: No new pathology.  Urinalysis No results found for this basename: colorurine,  appearanceur,  labspec,  phurine,  glucoseu,  hgbur,  bilirubinur,  ketonesur,  proteinur,  urobilinogen,  nitrite,  leukocytesur    RADIOGRAPHIC STUDIES: No results found.  ASSESSMENT:  #1. Lower extremity edema secondary to docetaxel, dexamethasone, and gabapentin. #2.Invasive ductal carcinoma of left breast with high grade DCIS. S/P left partial mastectomy on 07/03/2013 demonstrating a 1.4 cm invasive component that was  ER 74%, PR 16%, Ki-67 52%, and HER2 negative. However, positive margins were noted and therefore on 07/17/2013 she underwent a left modified radical mastectomy and left axillary lymph node dissection. Clear margins were ascertained with 9/62 positive lymph nodes positive for metastatic disease. Testing on the positive lymph node reveals HER2 positivity. She was therefore started on [facility] therapy on 08/27/2013. Cycle #4 due on 11/01/2013.    PLAN:  #1. Take Lasix 40 mg each morning. #2. Take gabapentin 300 mg at bedtime and in the morning. #3. Continue glutamine 1 g twice a day. #4. Return on 11/01/2013 to receive cycle #4 of chemotherapy. #5. If neuropathy worsens, may stop by 4 cycles of therapy and proceed to Herceptin every 3 weeks in conjunction with oral anastrozole.   All questions were answered. The patient knows to call the clinic with any problems, questions or concerns. We can certainly see the patient much sooner if necessary.   I spent 25 minutes counseling the patient face to face. The total time spent in the appointment was 30 minutes.    Doroteo Bradford, MD 10/29/2013 4:52 PM

## 2013-10-30 ENCOUNTER — Inpatient Hospital Stay (HOSPITAL_COMMUNITY): Payer: BC Managed Care – PPO

## 2013-10-31 ENCOUNTER — Ambulatory Visit (HOSPITAL_COMMUNITY): Payer: BC Managed Care – PPO

## 2013-11-01 ENCOUNTER — Inpatient Hospital Stay (HOSPITAL_COMMUNITY): Payer: BC Managed Care – PPO

## 2013-11-01 ENCOUNTER — Encounter (HOSPITAL_COMMUNITY): Payer: Self-pay

## 2013-11-01 ENCOUNTER — Encounter (HOSPITAL_BASED_OUTPATIENT_CLINIC_OR_DEPARTMENT_OTHER): Payer: BC Managed Care – PPO

## 2013-11-01 ENCOUNTER — Other Ambulatory Visit (HOSPITAL_COMMUNITY): Payer: Self-pay | Admitting: Hematology and Oncology

## 2013-11-01 VITALS — BP 117/63 | HR 88 | Temp 98.1°F | Resp 18 | Wt 157.0 lb

## 2013-11-01 DIAGNOSIS — C50919 Malignant neoplasm of unspecified site of unspecified female breast: Secondary | ICD-10-CM

## 2013-11-01 DIAGNOSIS — C773 Secondary and unspecified malignant neoplasm of axilla and upper limb lymph nodes: Secondary | ICD-10-CM

## 2013-11-01 DIAGNOSIS — Z5112 Encounter for antineoplastic immunotherapy: Secondary | ICD-10-CM

## 2013-11-01 DIAGNOSIS — F411 Generalized anxiety disorder: Secondary | ICD-10-CM

## 2013-11-01 DIAGNOSIS — I10 Essential (primary) hypertension: Secondary | ICD-10-CM

## 2013-11-01 DIAGNOSIS — G609 Hereditary and idiopathic neuropathy, unspecified: Secondary | ICD-10-CM

## 2013-11-01 LAB — CBC WITH DIFFERENTIAL/PLATELET
Basophils Absolute: 0 10*3/uL (ref 0.0–0.1)
Basophils Relative: 0 % (ref 0–1)
Eosinophils Absolute: 0 10*3/uL (ref 0.0–0.7)
Eosinophils Relative: 0 % (ref 0–5)
HCT: 28.6 % — ABNORMAL LOW (ref 36.0–46.0)
HEMOGLOBIN: 9.9 g/dL — AB (ref 12.0–15.0)
LYMPHS PCT: 12 % (ref 12–46)
Lymphs Abs: 1.2 10*3/uL (ref 0.7–4.0)
MCH: 35.6 pg — ABNORMAL HIGH (ref 26.0–34.0)
MCHC: 34.6 g/dL (ref 30.0–36.0)
MCV: 102.9 fL — ABNORMAL HIGH (ref 78.0–100.0)
MONO ABS: 0.8 10*3/uL (ref 0.1–1.0)
MONOS PCT: 9 % (ref 3–12)
NEUTROS ABS: 7.4 10*3/uL (ref 1.7–7.7)
Neutrophils Relative %: 79 % — ABNORMAL HIGH (ref 43–77)
Platelets: 335 10*3/uL (ref 150–400)
RBC: 2.78 MIL/uL — ABNORMAL LOW (ref 3.87–5.11)
RDW: 17 % — ABNORMAL HIGH (ref 11.5–15.5)
WBC: 9.4 10*3/uL (ref 4.0–10.5)

## 2013-11-01 LAB — COMPREHENSIVE METABOLIC PANEL
ALK PHOS: 76 U/L (ref 39–117)
ALT: 31 U/L (ref 0–35)
AST: 20 U/L (ref 0–37)
Albumin: 4.1 g/dL (ref 3.5–5.2)
BILIRUBIN TOTAL: 0.4 mg/dL (ref 0.3–1.2)
BUN: 17 mg/dL (ref 6–23)
CHLORIDE: 101 meq/L (ref 96–112)
CO2: 29 mEq/L (ref 19–32)
CREATININE: 0.71 mg/dL (ref 0.50–1.10)
Calcium: 9.8 mg/dL (ref 8.4–10.5)
GFR calc Af Amer: >90 mL/min (ref >90)
GFR calc non Af Amer: >90 mL/min (ref >90)
Glucose, Bld: 116 mg/dL — ABNORMAL HIGH (ref 70–99)
Potassium: 3.5 mEq/L — ABNORMAL LOW (ref 3.7–5.3)
Sodium: 142 mEq/L (ref 137–147)
Total Protein: 7.3 g/dL (ref 6.0–8.3)

## 2013-11-01 MED ORDER — ACETAMINOPHEN 325 MG PO TABS
650.0000 mg | ORAL_TABLET | Freq: Once | ORAL | Status: AC
  Administered 2013-11-01: 650 mg via ORAL

## 2013-11-01 MED ORDER — SODIUM CHLORIDE 0.9 % IV SOLN
Freq: Once | INTRAVENOUS | Status: AC
  Administered 2013-11-01: 10:00:00 via INTRAVENOUS

## 2013-11-01 MED ORDER — ACETAMINOPHEN 325 MG PO TABS
ORAL_TABLET | ORAL | Status: AC
  Filled 2013-11-01: qty 2

## 2013-11-01 MED ORDER — HEPARIN SOD (PORK) LOCK FLUSH 100 UNIT/ML IV SOLN
500.0000 [IU] | Freq: Once | INTRAVENOUS | Status: AC | PRN
  Administered 2013-11-01: 500 [IU]
  Filled 2013-11-01: qty 5

## 2013-11-01 MED ORDER — DEXAMETHASONE SODIUM PHOSPHATE 10 MG/ML IJ SOLN: 20.0000 mg | Freq: Once | INTRAMUSCULAR | Status: DC

## 2013-11-01 MED ORDER — SODIUM CHLORIDE 0.9 % IV SOLN: 16.0000 mg | Freq: Once | INTRAVENOUS | Status: DC

## 2013-11-01 MED ORDER — DIPHENHYDRAMINE HCL 25 MG PO CAPS
50.0000 mg | ORAL_CAPSULE | Freq: Once | ORAL | Status: AC
  Administered 2013-11-01: 50 mg via ORAL

## 2013-11-01 MED ORDER — DOCETAXEL CHEMO INJECTION 160 MG/16ML
75.0000 mg/m2 | Freq: Once | INTRAVENOUS | Status: AC
  Administered 2013-11-01: 130 mg via INTRAVENOUS
  Filled 2013-11-01: qty 13

## 2013-11-01 MED ORDER — SODIUM CHLORIDE 0.9 % IV SOLN
Freq: Once | INTRAVENOUS | Status: AC
  Administered 2013-11-01: 16 mg via INTRAVENOUS
  Filled 2013-11-01: qty 8

## 2013-11-01 MED ORDER — SODIUM CHLORIDE 0.9 % IJ SOLN
10.0000 mL | INTRAMUSCULAR | Status: DC | PRN
  Administered 2013-11-01: 10 mL

## 2013-11-01 MED ORDER — SODIUM CHLORIDE 0.9 % IV SOLN
6.0000 mg/kg | Freq: Once | INTRAVENOUS | Status: AC
  Administered 2013-11-01: 420 mg via INTRAVENOUS
  Filled 2013-11-01: qty 20

## 2013-11-01 MED ORDER — DIPHENHYDRAMINE HCL 25 MG PO CAPS
ORAL_CAPSULE | ORAL | Status: AC
  Filled 2013-11-01: qty 2

## 2013-11-01 MED ORDER — SODIUM CHLORIDE 0.9 % IV SOLN
570.0000 mg | Freq: Once | INTRAVENOUS | Status: AC
  Administered 2013-11-01: 570 mg via INTRAVENOUS
  Filled 2013-11-01: qty 57

## 2013-11-01 NOTE — Progress Notes (Signed)
[place]  OFFICE PROGRESS NOTE  [name], PA-C [address] [place] [address]  DIAGNOSIS: Invasive ductal carcinoma metastasized to axillary lymph node, left  Chief Complaint  Patient presents with  . Breast Cancer    Adjuvant TCH cycle #4    CURRENT THERAPY: Adjuvant TCH, cycle #4 today  INTERVAL HISTORY: Heather Vaughan 58 y.o. female returns for followup and continuation of adjuvant chemotherapy for HER-2 positive stage II breast cancer, for cycle #4 of [place] today. She still has minimal swelling of both lower extremities with some tenderness on the inner aspect of the right ankle not unlike the discomfort she fell when she injured attendant in the left ankle in the past. She feels flushes warning from taking dexamethasone. She denies a sore mouth, vaginal discharge or itching, with complete resolution of previously noted paresthesias while taking gabapentin twice a day. She denies any hot flashes, vaginal discharge, dysuria, headache, or seizures.  MEDICAL HISTORY: Past Medical History  Diagnosis Date  . GERD (gastroesophageal reflux disease)   . Hypertension   . Bronchitis 07/27/2011  . Barrett's esophagus   . Anxiety   . Breast cancer, left breast     INTERIM HISTORY: has GERD (gastroesophageal reflux disease); Hypertension; Barrett esophagus; and Invasive ductal carcinoma metastasized to axillary lymph node, left on her problem list.   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 S/P left partial mastectomy on 07/03/2013 demonstrating a 1.4 cm invasive component that was ER 74%, PR 16%, Ki-67 52%, and HER2 negative. However, positive margins were noted and therefore on 07/17/2013 she underwent a left modified radical mastectomy and left axillary lymph node dissection. Clear margins were obtained with 1/12 positive lymph nodes positive for metastatic disease. Testing on the positive lymph node  reveals HER2 positivity. She was therefore started on [facility] therapy on 08/27/2013.  ALLERGIES:  has No Known Allergies.  MEDICATIONS: has a current medication list which includes the following prescription(s): acetaminophen, carboplatin, dexamethasone, docetaxel, duloxetine, furosemide, gabapentin, lidocaine-prilocaine, losartan-hydrochlorothiazide, metoclopramide, nexium, pegfilgrastim, potassium chloride sa, prochlorperazine, trastuzumab, and trazodone.  SURGICAL HISTORY:  Past Surgical History  Procedure Laterality Date  . Excision morton's neuroma  07/14/2011    Procedure: EXCISION MORTON'S NEUROMA;  Surgeon: Marcheta Grammes;  Location: [facility];  Service: Orthopedics;  Laterality: Right;  Removal Neuroma 3rd Interspace Right Foot  . Esophagogastroduodenoscopy  08/03/2011    Procedure: ESOPHAGOGASTRODUODENOSCOPY (EGD);  Surgeon: Rogene Houston, MD;  Location: [facility];  Service: Endoscopy;  Laterality: N/A;  12:45  . Partial mastectomy with needle localization Left 07/03/2013    Procedure: PARTIAL MASTECTOMY STATUS POST NEEDLE LOCALIZATION;  Surgeon: Scherry Ran, MD;  Location: [facility];  Service: General;  Laterality: Left;  . Breast biopsy Bilateral     X 4- benign  . Mastectomy modified radical Left 07/17/2013    Procedure: MASTECTOMY MODIFIED RADICAL WITH LEFT AXILLARY TISSUE REMOVAL;  Surgeon: Scherry Ran, MD;  Location: [facility];  Service: General;  Laterality: Left;  . Portacath placement Right 08/16/2013    Procedure: INSERTION PORT-A-CATH RIGHT SUBCLAVIAN;  Surgeon: Scherry Ran, MD;  Location: [facility];  Service: General;  Laterality: Right;    FAMILY HISTORY: family history is negative for Anesthesia problems, Hypotension, Malignant hyperthermia, and Pseudochol deficiency.  SOCIAL HISTORY:  reports that she quit smoking about 20 years ago. Her smoking use included Cigarettes. She has a 20 pack-year smoking history. She does not  have any smokeless tobacco  history on file. She reports that she does not drink alcohol or use illicit drugs.  REVIEW OF SYSTEMS:  Other than that discussed above is noncontributory.  PHYSICAL EXAMINATION: ECOG PERFORMANCE STATUS: 1 - Symptomatic but completely ambulatory  There were no vitals taken for this visit.  GENERAL:alert, no distress and comfortable SKIN: skin color, texture, turgor are normal, no rashes or significant lesions EYES: PERLA; Conjunctiva are pink and non-injected, sclera clear OROPHARYNX:no exudate, no erythema on lips, buccal mucosa, or tongue. NECK: supple, thyroid normal size, non-tender, without nodularity. No masses CHEST: Status post left mastectomy with no subcutaneous nodules. LYMPH:  no palpable lymphadenopathy in the cervical, axillary or inguinal LUNGS: clear to auscultation and percussion with normal breathing effort HEART: regular rate & rhythm and no murmurs. ABDOMEN:abdomen soft, non-tender and normal bowel sounds MUSCULOSKELETAL:no cyanosis of digits and no clubbing. Range of motion normal. +1 edema bilateral locally ankles. NEURO: alert & oriented x 3 with fluent speech, no focal motor/sensory deficits   LABORATORY DATA: Infusion on 11/01/2013  Component Date Value Ref Range Status  . WBC 11/01/2013 9.4  4.0 - 10.5 K/uL Final  . RBC 11/01/2013 2.78* 3.87 - 5.11 MIL/uL Final  . Hemoglobin 11/01/2013 9.9* 12.0 - 15.0 g/dL Final  . HCT 11/01/2013 28.6* 36.0 - 46.0 % Final  . MCV 11/01/2013 102.9* 78.0 - 100.0 fL Final  . MCH 11/01/2013 35.6* 26.0 - 34.0 pg Final  . MCHC 11/01/2013 34.6  30.0 - 36.0 g/dL Final  . RDW 11/01/2013 17.0* 11.5 - 15.5 % Final  . Platelets 11/01/2013 335  150 - 400 K/uL Final  . Neutrophils Relative % 11/01/2013 79* 43 - 77 % Final  . Neutro Abs 11/01/2013 7.4  1.7 - 7.7 K/uL Final  . Lymphocytes Relative 11/01/2013 12  12 - 46 % Final  . Lymphs Abs 11/01/2013 1.2  0.7 - 4.0 K/uL Final  . Monocytes Relative 11/01/2013 9  3 - 12 % Final  .  Monocytes Absolute 11/01/2013 0.8  0.1 - 1.0 K/uL Final  . Eosinophils Relative 11/01/2013 0  0 - 5 % Final  . Eosinophils Absolute 11/01/2013 0.0  0.0 - 0.7 K/uL Final  . Basophils Relative 11/01/2013 0  0 - 1 % Final  . Basophils Absolute 11/01/2013 0.0  0.0 - 0.1 K/uL Final  Infusion on 10/09/2013  Component Date Value Ref Range Status  . WBC 10/09/2013 9.3  4.0 - 10.5 K/uL Final  . RBC 10/09/2013 2.78* 3.87 - 5.11 MIL/uL Final  . Hemoglobin 10/09/2013 9.4* 12.0 - 15.0 g/dL Final  . HCT 10/09/2013 28.0* 36.0 - 46.0 % Final  . MCV 10/09/2013 100.7* 78.0 - 100.0 fL Final  . MCH 10/09/2013 33.8  26.0 - 34.0 pg Final  . MCHC 10/09/2013 33.6  30.0 - 36.0 g/dL Final  . RDW 10/09/2013 15.7* 11.5 - 15.5 % Final  . Platelets 10/09/2013 282  150 - 400 K/uL Final  . Neutrophils Relative % 10/09/2013 79* 43 - 77 % Final  . Neutro Abs 10/09/2013 7.4  1.7 - 7.7 K/uL Final  . Lymphocytes Relative 10/09/2013 11* 12 - 46 % Final  . Lymphs Abs 10/09/2013 1.1  0.7 - 4.0 K/uL Final  . Monocytes Relative 10/09/2013 10  3 - 12 % Final  . Monocytes Absolute 10/09/2013 0.9  0.1 - 1.0 K/uL Final  . Eosinophils Relative 10/09/2013 0  0 - 5 % Final  . Eosinophils Absolute 10/09/2013 0.0  0.0 - 0.7  K/uL Final  . Basophils Relative 10/09/2013 0  0 - 1 % Final  . Basophils Absolute 10/09/2013 0.0  0.0 - 0.1 K/uL Final  . Sodium 10/09/2013 144  137 - 147 mEq/L Final  . Potassium 10/09/2013 3.9  3.7 - 5.3 mEq/L Final  . Chloride 10/09/2013 106  96 - 112 mEq/L Final  . CO2 10/09/2013 26  19 - 32 mEq/L Final  . Glucose, Bld 10/09/2013 95  70 - 99 mg/dL Final  . BUN 10/09/2013 15  6 - 23 mg/dL Final  . Creatinine, Ser 10/09/2013 0.66  0.50 - 1.10 mg/dL Final  . Calcium 10/09/2013 9.4  8.4 - 10.5 mg/dL Final  . Total Protein 10/09/2013 7.0  6.0 - 8.3 g/dL Final  . Albumin 10/09/2013 3.7  3.5 - 5.2 g/dL Final  . AST 10/09/2013 20  0 - 37 U/L Final  . ALT 10/09/2013 25  0 - 35 U/L Final  . Alkaline Phosphatase  10/09/2013 85  39 - 117 U/L Final  . Total Bilirubin 10/09/2013 0.2* 0.3 - 1.2 mg/dL Final  . GFR calc non Af Amer 10/09/2013 >90  >90 mL/min Final  . GFR calc Af Amer 10/09/2013 >90  >90 mL/min Final   Comment: (NOTE)                          The eGFR has been calculated using the CKD EPI equation.                          This calculation has not been validated in all clinical situations.                          eGFR's persistently <90 mL/min signify possible Chronic Kidney                          Disease.    PATHOLOGY: No new pathology.  Urinalysis No results found for this basename: colorurine,  appearanceur,  labspec,  phurine,  glucoseu,  hgbur,  bilirubinur,  ketonesur,  proteinur,  urobilinogen,  nitrite,  leukocytesur    RADIOGRAPHIC STUDIES: No results found.  ASSESSMENT:  #1. 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 S/P left partial mastectomy on 07/03/2013 demonstrating a 1.4 cm invasive component that was ER 74%, PR 16%, Ki-67 52%, and HER2 negative. However, positive margins were noted and therefore on 07/17/2013 she underwent a left modified radical mastectomy and left axillary lymph node dissection. Clear margins were ascertained with 6/72 positive lymph nodes positive for metastatic disease. Testing on the positive lymph node reveals HER2 positivity. She was therefore started on [facility] therapy on 08/27/2013. Cycle #4 due today. #2. Probable sprained right ankle. #3. Peripheral neuropathy well-controlled with gabapentin 300 mg twice a day.     PLAN:  #1. [place] cycle #4 today followed by Neulasta 6 mg subcutaneous he tomorrow. #2. Right ankle brace or wrap right ankle with Ace bandage prior to work. #3. Continue gabapentin 300 mg twice a day. #4. Continue glutamine 1 g twice a day along with Lasix 40 mg each morning. #5. Followup in 3 weeks for cycle #5 of a planned 6.   All questions were answered. The patient knows to call the clinic with any  problems, questions or concerns. We can certainly see the patient much sooner if necessary.   I  spent 25 minutes counseling the patient face to face. The total time spent in the appointment was 30 minutes.    Doroteo Bradford, MD 11/01/2013 9:16 AM

## 2013-11-01 NOTE — Patient Instructions (Signed)
[facility] Discharge Instructions for Patients Receiving Chemotherapy  Today you received the following chemotherapy agents Herceptin, Taxotere and Carboplatin. Continue all medications as prescribed. Keep all appointments as scheduled. Meet Tammy Saturday morning at 10 am in the main lobby, she will be here to give you your Neulasta injection. Report any issues/concerns to clinic as needed prior to appointments.  If you develop nausea and vomiting that is not controlled by your nausea medication, call the clinic. If it is after clinic hours your family physician or the after hours number for the clinic or go to the Emergency Department.   BELOW ARE SYMPTOMS THAT SHOULD BE REPORTED IMMEDIATELY:  *FEVER GREATER THAN 101.0 F  *CHILLS WITH OR WITHOUT FEVER  NAUSEA AND VOMITING THAT IS NOT CONTROLLED WITH YOUR NAUSEA MEDICATION  *UNUSUAL SHORTNESS OF BREATH  *UNUSUAL BRUISING OR BLEEDING  TENDERNESS IN MOUTH AND THROAT WITH OR WITHOUT PRESENCE OF ULCERS  *URINARY PROBLEMS  *BOWEL PROBLEMS  UNUSUAL RASH Items with * indicate a potential emergency and should be followed up as soon as possible.  One of the nurses will contact you 24 hours after your treatment. Please let the nurse know about any problems that you may have experienced. Feel free to call the clinic you have any questions or concerns. The clinic phone number is [phone].   I have been informed and understand all the instructions given to me. I know to contact the clinic, my physician, or go to the Emergency Department if any problems should occur. I do not have any questions at this time, but understand that I may call the clinic during office hours or the Patient Navigator at [phone] should I have any questions or need assistance in obtaining follow up care.    __________________________________________  _____________  __________ Signature of Patient or Authorized Representative             Date                   Time    __________________________________________ Nurse's Signature

## 2013-11-02 ENCOUNTER — Encounter (HOSPITAL_BASED_OUTPATIENT_CLINIC_OR_DEPARTMENT_OTHER): Payer: BC Managed Care – PPO

## 2013-11-02 VITALS — BP 126/63 | HR 79 | Temp 98.1°F | Resp 18

## 2013-11-02 DIAGNOSIS — C50919 Malignant neoplasm of unspecified site of unspecified female breast: Secondary | ICD-10-CM

## 2013-11-02 DIAGNOSIS — Z5189 Encounter for other specified aftercare: Secondary | ICD-10-CM

## 2013-11-02 DIAGNOSIS — C773 Secondary and unspecified malignant neoplasm of axilla and upper limb lymph nodes: Secondary | ICD-10-CM

## 2013-11-02 MED ORDER — PEGFILGRASTIM INJECTION 6 MG/0.6ML
SUBCUTANEOUS | Status: AC
  Filled 2013-11-02: qty 0.6

## 2013-11-02 MED ORDER — PEGFILGRASTIM INJECTION 6 MG/0.6ML
6.0000 mg | Freq: Once | SUBCUTANEOUS | Status: AC
  Administered 2013-11-02: 6 mg via SUBCUTANEOUS

## 2013-11-02 NOTE — Progress Notes (Signed)
Heather Vaughan presents today for injection per MD orders. Neulasta 6mg  administered SQ in left Abdomen. Administration without incident. Patient tolerated well.

## 2013-11-04 ENCOUNTER — Other Ambulatory Visit (HOSPITAL_COMMUNITY): Payer: Self-pay | Admitting: Oncology

## 2013-11-04 DIAGNOSIS — E876 Hypokalemia: Secondary | ICD-10-CM

## 2013-11-04 MED ORDER — POTASSIUM CHLORIDE CRYS ER 20 MEQ PO TBCR: 20.0000 meq | EXTENDED_RELEASE_TABLET | Freq: Once | ORAL | Status: DC

## 2013-11-11 ENCOUNTER — Encounter (HOSPITAL_COMMUNITY)
Admission: RE | Admit: 2013-11-11 | Discharge: 2013-11-11 | Disposition: A | Discharge status: Home / Self Care | Payer: BC Managed Care – PPO | Source: Ambulatory Visit | Attending: Oncology | Admitting: Oncology

## 2013-11-11 ENCOUNTER — Encounter (HOSPITAL_COMMUNITY): Payer: Self-pay

## 2013-11-11 DIAGNOSIS — C50919 Malignant neoplasm of unspecified site of unspecified female breast: Secondary | ICD-10-CM | POA: Insufficient documentation

## 2013-11-11 DIAGNOSIS — Z79899 Other long term (current) drug therapy: Secondary | ICD-10-CM | POA: Insufficient documentation

## 2013-11-11 DIAGNOSIS — C773 Secondary and unspecified malignant neoplasm of axilla and upper limb lymph nodes: Secondary | ICD-10-CM | POA: Insufficient documentation

## 2013-11-11 MED ORDER — HEPARIN SOD (PORK) LOCK FLUSH 100 UNIT/ML IV SOLN
INTRAVENOUS | Status: AC
  Filled 2013-11-11: qty 5

## 2013-11-11 MED ORDER — TECHNETIUM TC 99M-LABELED RED BLOOD CELLS IV KIT
25.0000 | PACK | Freq: Once | INTRAVENOUS | Status: AC | PRN
  Administered 2013-11-11: 25 via INTRAVENOUS

## 2013-11-15 ENCOUNTER — Telehealth (HOSPITAL_COMMUNITY): Payer: Self-pay | Admitting: *Deleted

## 2013-11-15 NOTE — Telephone Encounter (Signed)
Patient is currently taking Lasix 40mg  daily. Patient instructed to increase Lasix to 80mg  daily x 4 days per Dr. Barnet Glasgow. Patient to call back on Monday to let us know if there is improvement in the ankle edema. Patient verbalized how to take her Lasix to me.

## 2013-11-18 ENCOUNTER — Telehealth (HOSPITAL_COMMUNITY): Payer: Self-pay | Admitting: *Deleted

## 2013-11-18 ENCOUNTER — Other Ambulatory Visit (HOSPITAL_COMMUNITY): Payer: Self-pay | Admitting: Hematology and Oncology

## 2013-11-18 DIAGNOSIS — R600 Localized edema: Secondary | ICD-10-CM

## 2013-11-18 NOTE — Telephone Encounter (Signed)
Voicemail left on patient's cell phone that instructed her to continue taking Lasix 80mg  daily.

## 2013-11-18 NOTE — Telephone Encounter (Signed)
Pt called to let us know that the swelling in her ankles and lower legs is not any better after taking Lasix 80 mg daily since Friday. Patient instructed to weigh herself daily and record. Patient instructed to purchase some compression socks that come up to knee to help push fluid back into venous system. These instructions were given to me by Dr. Barnet Glasgow. Patient verbalized understanding of instructions. Patient also instructed that we will check her thyroid when she comes in for MD visit on 11/25/13.

## 2013-11-21 ENCOUNTER — Inpatient Hospital Stay (HOSPITAL_COMMUNITY): Payer: BC Managed Care – PPO

## 2013-11-22 ENCOUNTER — Ambulatory Visit (HOSPITAL_COMMUNITY): Payer: BC Managed Care – PPO

## 2013-11-22 NOTE — Progress Notes (Addendum)
[place], PA-C [address] [place] [address]  Invasive ductal carcinoma metastasized to axillary lymph node, left - Plan: CBC with Differential, Comprehensive metabolic panel, [facility]  CURRENT THERAPY:S/P cycle 4 of TCH + Neulasta support beginning on 08/27/2013.   INTERVAL HISTORY: Heather Vaughan 58 y.o. female returns for  regular  visit for followup of Invasive ductal carcinoma of left breast with high grade DCIS. S/P left partial mastectomy on 07/03/2013 demonstrating a 1.4 cm invasive component that was ER 74%, PR 16%, Ki-67 52%, and HER2 negative. However, positive margins were noted and therefore on 07/17/2013 she underwent a left modified radical mastectomy and left axillary lymph node dissection. Clear margins were ascertained with 3/57 positive lymph nodes positive for metastatic disease. Testing on the positive lymph node reveals HER2 positivity. OncoType DX score was 29 placing her in the high-end of the intermediate risk group for recurrence. She was therefore started on [facility] therapy on 08/27/2013.    Invasive ductal carcinoma metastasized to axillary lymph node, left   05/28/2013 Imaging Screening mammogram   06/19/2013 Imaging Left diagnostic mammogram   06/25/2013 Imaging CT CAP- borderline enlarged axillary lymph node on left.  Small pulmonary nodules noted but too small for PET or biopsy; surveillance recommended.    07/03/2013 Surgery Left partial mastectomy.  Invasive ductal carcinoma. 1.4 cm.  POSITIVE MARGINS.  Additional extensive DCIS noted.  ER 74%, PR 16%, Ki-67 52%, HER2 NEGATIVE.   07/17/2013 Surgery Left modified radiacl mastectomy with left axillary node dissection.  Negative residual cancer.  Negative inked margins.  1/12 positive lymph nodes.  FINAL MARGINS NEGATIVE.  LYMPH NODE IS HER2 POSITIVE.   08/13/2013 Imaging MUGA scan shows left ventricular EF of 62%   08/27/2013 -  Chemotherapy TCH    I personally reviewed and went  over laboratory results with the patient.  The results are noted within this dictation.  I personally reviewed and went over radiographic studies with the patient.  The results are noted within this dictation.   MUGA scan on 11/12/2013 demonstrated a stable EF at 66%.  She is having B/L LE edema that is present 2+ pitting and much worse by the end of the day while working.  She showed me a picture on her cell phone which illustrates a 3+ pitting edema B/L.  She is taking Lasix and I recommended she get this medication refilled.  I will add Aldactone 100 mg to this daily, until signs and symptoms resolve.  With her Hgb being 8.7 g/dL today, we will transfuse her 2 units of PRBCs.  This should help with osmotic pressure and improve her LE edema.  Since she is receiving curative intent treatment, she is not a candidate for ESA therapy.  This will also help with her energy level.  Oncologically, she denies any complaints and ROS questioning is negative.   Past Medical History  Diagnosis Date  . GERD (gastroesophageal reflux disease)   . Hypertension   . Bronchitis 07/27/2011  . Barrett's esophagus   . Anxiety   . Breast cancer, left breast     has GERD (gastroesophageal reflux disease); Hypertension; Barrett esophagus; and Invasive ductal carcinoma metastasized to axillary lymph node, left on her problem list.     has No Known Allergies.  Heather Vaughan does not currently have medications on file.  Past Surgical History  Procedure Laterality Date  . Excision morton's neuroma  07/14/2011    Procedure: EXCISION MORTON'S NEUROMA;  Surgeon: Leanna Sato  Caprice Beaver;  Location: [facility];  Service: Orthopedics;  Laterality: Right;  Removal Neuroma 3rd Interspace Right Foot  . Esophagogastroduodenoscopy  08/03/2011    Procedure: ESOPHAGOGASTRODUODENOSCOPY (EGD);  Surgeon: Rogene [place], MD;  Location: [facility];  Service: Endoscopy;  Laterality: N/A;  12:45  . Partial mastectomy with needle  localization Left 07/03/2013    Procedure: PARTIAL MASTECTOMY STATUS POST NEEDLE LOCALIZATION;  Surgeon: Scherry Ran, MD;  Location: [facility];  Service: General;  Laterality: Left;  . Breast biopsy Bilateral     X 4- benign  . Mastectomy modified radical Left 07/17/2013    Procedure: MASTECTOMY MODIFIED RADICAL WITH LEFT AXILLARY TISSUE REMOVAL;  Surgeon: Scherry Ran, MD;  Location: [facility];  Service: General;  Laterality: Left;  . Portacath placement Right 08/16/2013    Procedure: INSERTION PORT-A-CATH RIGHT SUBCLAVIAN;  Surgeon: Scherry Ran, MD;  Location: [facility];  Service: General;  Laterality: Right;    Denies any headaches, dizziness, double vision, fevers, chills, night sweats, nausea, vomiting, diarrhea, constipation, chest pain, heart palpitations, shortness of breath, blood in stool, black tarry stool, urinary pain, urinary burning, urinary frequency, hematuria.   PHYSICAL EXAMINATION  ECOG PERFORMANCE STATUS: 1 - Symptomatic but completely ambulatory  There were no vitals filed for this visit.  GENERAL:alert, no distress, well nourished, well developed, comfortable, cooperative and smiling SKIN: skin color, texture, turgor are normal, no rashes or significant lesions HEAD: Normocephalic, No masses, lesions, tenderness or abnormalities EYES: normal, PERRLA, EOMI, Conjunctiva are pink and non-injected EARS: External ears normal OROPHARYNX:mucous membranes are moist  NECK: supple, trachea midline LYMPH:  not examined BREAST:not examined LUNGS: clear to auscultation  HEART: regular rate & rhythm ABDOMEN:abdomen soft and normal bowel sounds BACK: Back symmetric, no curvature. EXTREMITIES:less then 2 second capillary refill, no joint deformities, effusion, or inflammation, no skin discoloration, no clubbing, no cyanosis, positive findings:  edema 2+ pitting edema in B/L LE  NEURO: alert & oriented x 3 with fluent speech, no focal motor/sensory deficits, gait  normal   LABORATORY DATA: CBC    Component Value Date/Time   WBC 6.7 11/25/2013 0918   RBC 2.43* 11/25/2013 0918   HGB 8.7* 11/25/2013 0918   HCT 25.6* 11/25/2013 0918   PLT 295 11/25/2013 0918   MCV 105.3* 11/25/2013 0918   MCH 35.8* 11/25/2013 0918   MCHC 34.0 11/25/2013 0918   RDW 16.6* 11/25/2013 0918   LYMPHSABS 0.7 11/25/2013 0918   MONOABS 0.1 11/25/2013 0918   EOSABS 0.0 11/25/2013 0918   BASOSABS 0.0 11/25/2013 0918      Chemistry      Component Value Date/Time   NA 140 11/25/2013 0847   K 3.3* 11/25/2013 0847   CL 98 11/25/2013 0847   CO2 28 11/25/2013 0847   BUN 22 11/25/2013 0847   CREATININE 0.77 11/25/2013 0847      Component Value Date/Time   CALCIUM 9.5 11/25/2013 0847   ALKPHOS 79 11/25/2013 0847   AST 24 11/25/2013 0847   ALT 22 11/25/2013 0847   BILITOT 0.3 11/25/2013 0847     No results found for this basename: TSH     RADIOGRAPHIC STUDIES:  11/12/2013  CLINICAL DATA: Surveillance of left ventricular ejection fraction  wall on Herceptin for breast carcinoma.  EXAM:  NUCLEAR MEDICINE CARDIAC BLOOD POOL IMAGING (MUGA)  TECHNIQUE:  Cardiac multi-gated acquisition was performed at rest following  intravenous injection of Tc-48mlabeled red blood cells.  RADIOPHARMACEUTICALS: 25MILLI CURIE ULTRATAG TECHNETIUM TC  68M-LABELED RED  BLOOD CELLS IV KITmCiTc-35min-vitro labeled red  blood cells.  COMPARISON: NM CARDIA MUGA REST dated 08/13/2013  FINDINGS:  Cine evaluation shows grossly normal left ventricular wall motion.  Estimated quantitative ejection fraction is 66%.  IMPRESSION:  Normal left ventricular function with estimated ejection fraction of  66%.  Electronically Signed  By: GAletta EdouardM.D.  On: 11/11/2013 15:08     ASSESSMENT:  1. 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 S/P left partial mastectomy on 07/03/2013 demonstrating a 1.4 cm invasive component that was ER 74%, PR 16%, Ki-67 52%, and HER2 negative. However,  positive margins were noted and therefore on 07/17/2013 she underwent a left modified radical mastectomy and left axillary lymph node dissection. Clear margins were ascertained with 19/92positive lymph nodes positive for metastatic disease. Testing on the positive lymph node reveals HER2 positivity. OncoType DX score was 29 placing her in the high-end of the intermediate risk group for recurrence.  [facility]  2. Tobacco abuse, 20 pack year smoking history  3. Pulmonary nodules, likely benign. Future surveillance is recommended per radiologist reading report. 4. Peripheral neuropathy well-controlled with gabapentin 300 mg twice a day. 5. B/L LE edema 6. Progressive anemia   Patient Active Problem List   Diagnosis Date Noted  . Invasive ductal carcinoma metastasized to axillary lymph node, left 08/09/2013  . Barrett esophagus 12/22/2011  . GERD (gastroesophageal reflux disease) 06/23/2011  . Hypertension 06/23/2011     PLAN:  1. I personally reviewed and went over laboratory results with the patient.  The results are noted within this dictation. 2. I personally reviewed and went over radiographic studies with the patient.  The results are noted within this dictation.   3. Pre-chemo labs: CBC diff, CMET 4. TSH today 5. Refill of Lasix 6. Aldactone 50 mg daily #30, 0 refills 7. Type and cross today 8. 2 unit PRBC transfusion.  Orders placed.  9. Patient education regarding PRBC transfusions 10. Patient education regarding LE edema 11. MUGA in June 2015 12. [id number]. Return in 3 weeks for follow-up   THERAPY PLAN:  Rhyli's treatment plan is to complete 6 cycles of TCH with 52 weeks worth of Herceptin. We will continue with symptom management, toxicity control, and quality of life issues while she undergoes systemic chemotherapy. Following the completion of TC, she will undergo radiation therapy in addition  to an Aromatase inhibitor.   All questions were answered. The patient knows to call the clinic with any problems, questions or concerns. We can certainly see the patient much sooner if necessary.  Patient and plan discussed with Dr. GFarrel Gobbleand he is in agreement with the aforementioned.   Tekoa Amon 11/25/2013

## 2013-11-25 ENCOUNTER — Encounter (HOSPITAL_BASED_OUTPATIENT_CLINIC_OR_DEPARTMENT_OTHER): Payer: BC Managed Care – PPO | Admitting: Oncology

## 2013-11-25 ENCOUNTER — Other Ambulatory Visit (HOSPITAL_COMMUNITY): Payer: Self-pay | Admitting: Oncology

## 2013-11-25 ENCOUNTER — Other Ambulatory Visit (HOSPITAL_COMMUNITY): Payer: Self-pay | Admitting: Hematology and Oncology

## 2013-11-25 ENCOUNTER — Encounter (HOSPITAL_BASED_OUTPATIENT_CLINIC_OR_DEPARTMENT_OTHER): Payer: BC Managed Care – PPO

## 2013-11-25 VITALS — BP 123/72 | HR 73 | Temp 97.9°F | Resp 18 | Wt 160.0 lb

## 2013-11-25 DIAGNOSIS — R600 Localized edema: Secondary | ICD-10-CM

## 2013-11-25 DIAGNOSIS — T451X5A Adverse effect of antineoplastic and immunosuppressive drugs, initial encounter: Secondary | ICD-10-CM

## 2013-11-25 DIAGNOSIS — D6481 Anemia due to antineoplastic chemotherapy: Secondary | ICD-10-CM | POA: Insufficient documentation

## 2013-11-25 DIAGNOSIS — C773 Secondary and unspecified malignant neoplasm of axilla and upper limb lymph nodes: Secondary | ICD-10-CM

## 2013-11-25 DIAGNOSIS — D649 Anemia, unspecified: Secondary | ICD-10-CM

## 2013-11-25 DIAGNOSIS — E876 Hypokalemia: Secondary | ICD-10-CM

## 2013-11-25 DIAGNOSIS — C50919 Malignant neoplasm of unspecified site of unspecified female breast: Secondary | ICD-10-CM

## 2013-11-25 DIAGNOSIS — R609 Edema, unspecified: Secondary | ICD-10-CM

## 2013-11-25 DIAGNOSIS — Z5112 Encounter for antineoplastic immunotherapy: Secondary | ICD-10-CM

## 2013-11-25 DIAGNOSIS — Z5111 Encounter for antineoplastic chemotherapy: Secondary | ICD-10-CM

## 2013-11-25 LAB — CBC WITH DIFFERENTIAL/PLATELET
Basophils Absolute: 0 10*3/uL (ref 0.0–0.1)
Basophils Relative: 0 % (ref 0–1)
Eosinophils Absolute: 0 10*3/uL (ref 0.0–0.7)
Eosinophils Relative: 0 % (ref 0–5)
HEMATOCRIT: 25.6 % — AB (ref 36.0–46.0)
HEMOGLOBIN: 8.7 g/dL — AB (ref 12.0–15.0)
LYMPHS ABS: 0.7 10*3/uL (ref 0.7–4.0)
Lymphocytes Relative: 10 % — ABNORMAL LOW (ref 12–46)
MCH: 35.8 pg — AB (ref 26.0–34.0)
MCHC: 34 g/dL (ref 30.0–36.0)
MCV: 105.3 fL — ABNORMAL HIGH (ref 78.0–100.0)
MONOS PCT: 2 % — AB (ref 3–12)
Monocytes Absolute: 0.1 10*3/uL (ref 0.1–1.0)
NEUTROS PCT: 88 % — AB (ref 43–77)
Neutro Abs: 5.9 10*3/uL (ref 1.7–7.7)
Platelets: 295 10*3/uL (ref 150–400)
RBC: 2.43 MIL/uL — AB (ref 3.87–5.11)
RDW: 16.6 % — ABNORMAL HIGH (ref 11.5–15.5)
WBC: 6.7 10*3/uL (ref 4.0–10.5)

## 2013-11-25 LAB — PREPARE RBC (CROSSMATCH)

## 2013-11-25 LAB — ABO/RH: ABO/RH(D): O POS

## 2013-11-25 LAB — COMPREHENSIVE METABOLIC PANEL
ALBUMIN: 3.9 g/dL (ref 3.5–5.2)
ALT: 22 U/L (ref 0–35)
AST: 24 U/L (ref 0–37)
Alkaline Phosphatase: 79 U/L (ref 39–117)
BUN: 22 mg/dL (ref 6–23)
CALCIUM: 9.5 mg/dL (ref 8.4–10.5)
CO2: 28 mEq/L (ref 19–32)
Chloride: 98 mEq/L (ref 96–112)
Creatinine, Ser: 0.77 mg/dL (ref 0.50–1.10)
GFR calc non Af Amer: >90 mL/min (ref >90)
Glucose, Bld: 218 mg/dL — ABNORMAL HIGH (ref 70–99)
Potassium: 3.3 mEq/L — ABNORMAL LOW (ref 3.7–5.3)
Sodium: 140 mEq/L (ref 137–147)
TOTAL PROTEIN: 7.1 g/dL (ref 6.0–8.3)
Total Bilirubin: 0.3 mg/dL (ref 0.3–1.2)

## 2013-11-25 LAB — TSH: TSH: 0.335 u[IU]/mL — ABNORMAL LOW (ref 0.350–4.500)

## 2013-11-25 MED ORDER — POTASSIUM CHLORIDE CRYS ER 20 MEQ PO TBCR: 20.0000 meq | EXTENDED_RELEASE_TABLET | Freq: Two times a day (BID) | ORAL | Status: DC

## 2013-11-25 MED ORDER — ONDANSETRON HCL 40 MG/20ML IJ SOLN: 16.0000 mg | Freq: Once | INTRAMUSCULAR | Status: DC

## 2013-11-25 MED ORDER — HEPARIN SOD (PORK) LOCK FLUSH 100 UNIT/ML IV SOLN
500.0000 [IU] | Freq: Once | INTRAVENOUS | Status: AC | PRN
  Administered 2013-11-25: 500 [IU]
  Filled 2013-11-25: qty 5

## 2013-11-25 MED ORDER — DOCETAXEL CHEMO INJECTION 160 MG/16ML
75.0000 mg/m2 | Freq: Once | INTRAVENOUS | Status: AC
  Administered 2013-11-25: 130 mg via INTRAVENOUS
  Filled 2013-11-25: qty 13

## 2013-11-25 MED ORDER — SODIUM CHLORIDE 0.9 % IV SOLN
250.0000 mL | Freq: Once | INTRAVENOUS | Status: AC
  Administered 2013-11-25: 250 mL via INTRAVENOUS

## 2013-11-25 MED ORDER — SODIUM CHLORIDE 0.9 % IJ SOLN: 10.0000 mL | INTRAMUSCULAR | Status: DC | PRN

## 2013-11-25 MED ORDER — ACETAMINOPHEN 325 MG PO TABS
ORAL_TABLET | ORAL | Status: AC
  Filled 2013-11-25: qty 2

## 2013-11-25 MED ORDER — DEXAMETHASONE SODIUM PHOSPHATE 10 MG/ML IJ SOLN: 20.0000 mg | Freq: Once | INTRAMUSCULAR | Status: DC

## 2013-11-25 MED ORDER — DIPHENHYDRAMINE HCL 25 MG PO CAPS
ORAL_CAPSULE | ORAL | Status: AC
Start: 2013-11-25 — End: 2013-11-25
  Filled 2013-11-25: qty 2

## 2013-11-25 MED ORDER — ACETAMINOPHEN 325 MG PO TABS
650.0000 mg | ORAL_TABLET | Freq: Once | ORAL | Status: AC
  Administered 2013-11-25: 650 mg via ORAL

## 2013-11-25 MED ORDER — SPIRONOLACTONE 100 MG PO TABS: 100.0000 mg | ORAL_TABLET | Freq: Every day | ORAL | Status: DC

## 2013-11-25 MED ORDER — SODIUM CHLORIDE 0.9 % IV SOLN
570.0000 mg | Freq: Once | INTRAVENOUS | Status: AC
  Administered 2013-11-25: 570 mg via INTRAVENOUS
  Filled 2013-11-25: qty 57

## 2013-11-25 MED ORDER — SODIUM CHLORIDE 0.9 % IV SOLN
Freq: Once | INTRAVENOUS | Status: AC
  Administered 2013-11-25: 09:00:00 via INTRAVENOUS

## 2013-11-25 MED ORDER — TRASTUZUMAB CHEMO INJECTION 440 MG
6.0000 mg/kg | Freq: Once | INTRAVENOUS | Status: AC
  Administered 2013-11-25: 420 mg via INTRAVENOUS
  Filled 2013-11-25: qty 20

## 2013-11-25 MED ORDER — DIPHENHYDRAMINE HCL 25 MG PO CAPS
50.0000 mg | ORAL_CAPSULE | Freq: Once | ORAL | Status: AC
  Administered 2013-11-25: 50 mg via ORAL

## 2013-11-25 MED ORDER — SODIUM CHLORIDE 0.9 % IV SOLN
Freq: Once | INTRAVENOUS | Status: AC
  Administered 2013-11-25: 16 mg via INTRAVENOUS
  Filled 2013-11-25: qty 8

## 2013-11-25 NOTE — Progress Notes (Signed)
Tamala Bari Tolerated chemotherapy well today. Tolerated 1 unit of PRBC today without incident.  Discharged ambulatory with sister.

## 2013-11-25 NOTE — Patient Instructions (Signed)
   [facility] Discharge Instructions for Patients Receiving Chemotherapy  Today you received the following chemotherapy agents taxotere, carboplatin, herceptin  To help prevent nausea and vomiting after your treatment, we encourage you to take your nausea medication decadron 4 mg for 2 nights as needed after chemo   If you develop nausea and vomiting that is not controlled by your nausea medication, call the clinic.   BELOW ARE SYMPTOMS THAT SHOULD BE REPORTED IMMEDIATELY:  *FEVER GREATER THAN 100.5 F  *CHILLS WITH OR WITHOUT FEVER  NAUSEA AND VOMITING THAT IS NOT CONTROLLED WITH YOUR NAUSEA MEDICATION  *UNUSUAL SHORTNESS OF BREATH  *UNUSUAL BRUISING OR BLEEDING  TENDERNESS IN MOUTH AND THROAT WITH OR WITHOUT PRESENCE OF ULCERS  *URINARY PROBLEMS  *BOWEL PROBLEMS  UNUSUAL RASH Items with * indicate a potential emergency and should be followed up as soon as possible.  Feel free to call the clinic you have any questions or concerns. The clinic phone number is [phone].

## 2013-11-25 NOTE — Patient Instructions (Signed)
[facility] Discharge Instructions  RECOMMENDATIONS MADE BY THE CONSULTANT AND ANY TEST RESULTS WILL BE SENT TO YOUR REFERRING PHYSICIAN.  EXAM FINDINGS BY THE PHYSICIAN TODAY AND SIGNS OR SYMPTOMS TO REPORT TO CLINIC OR PRIMARY PHYSICIAN: Exam and findings as discussed by Robynn Pane, PA-C.  Will give you 2 units of blood today.  Will make referral for radiation consult at [place]:  Aldactone - take with lasix   INSTRUCTIONS/FOLLOW-UP: Follow-up in 3 weeks.  Thank you for choosing [facility] to provide your oncology and hematology care.  To afford each patient quality time with our providers, please arrive at least 15 minutes before your scheduled appointment time.  With your help, our goal is to use those 15 minutes to complete the necessary work-up to ensure our physicians have the information they need to help with your evaluation and healthcare recommendations.    Effective January 1st, 2014, we ask that you re-schedule your appointment with our physicians should you arrive 10 or more minutes late for your appointment.  We strive to give you quality time with our providers, and arriving late affects you and other patients whose appointments are after yours.    Again, thank you for choosing [facility].  Our hope is that these requests will decrease the amount of time that you wait before being seen by our physicians.       _____________________________________________________________  Should you have questions after your visit to [facility], please contact our office at [phone] between the hours of 8:30 a.m. and 5:00 p.m.  Voicemails left after 4:30 p.m. will not be returned until the following business day.  For prescription refill requests, have your pharmacy contact our office with your prescription refill request.

## 2013-11-26 ENCOUNTER — Encounter (HOSPITAL_BASED_OUTPATIENT_CLINIC_OR_DEPARTMENT_OTHER): Payer: BC Managed Care – PPO

## 2013-11-26 ENCOUNTER — Encounter (HOSPITAL_COMMUNITY): Payer: BC Managed Care – PPO

## 2013-11-26 ENCOUNTER — Ambulatory Visit (HOSPITAL_COMMUNITY): Payer: BC Managed Care – PPO

## 2013-11-26 VITALS — BP 107/59 | HR 82 | Temp 98.1°F | Resp 16

## 2013-11-26 DIAGNOSIS — C50919 Malignant neoplasm of unspecified site of unspecified female breast: Secondary | ICD-10-CM

## 2013-11-26 DIAGNOSIS — C773 Secondary and unspecified malignant neoplasm of axilla and upper limb lymph nodes: Secondary | ICD-10-CM

## 2013-11-26 DIAGNOSIS — Z5189 Encounter for other specified aftercare: Secondary | ICD-10-CM

## 2013-11-26 MED ORDER — PEGFILGRASTIM INJECTION 6 MG/0.6ML
SUBCUTANEOUS | Status: AC
  Filled 2013-11-26: qty 0.6

## 2013-11-26 MED ORDER — PEGFILGRASTIM INJECTION 6 MG/0.6ML
6.0000 mg | Freq: Once | SUBCUTANEOUS | Status: AC
  Administered 2013-11-26: 6 mg via SUBCUTANEOUS

## 2013-11-26 MED ORDER — SODIUM CHLORIDE 0.9 % IJ SOLN
10.0000 mL | INTRAMUSCULAR | Status: AC | PRN
  Administered 2013-11-26: 10 mL

## 2013-11-26 MED ORDER — HEPARIN SOD (PORK) LOCK FLUSH 100 UNIT/ML IV SOLN
500.0000 [IU] | Freq: Every day | INTRAVENOUS | Status: AC | PRN
  Administered 2013-11-26: 500 [IU]
  Filled 2013-11-26: qty 5

## 2013-11-26 MED ORDER — SODIUM CHLORIDE 0.9 % IV SOLN
250.0000 mL | Freq: Once | INTRAVENOUS | Status: AC
  Administered 2013-11-26: 250 mL via INTRAVENOUS

## 2013-11-26 NOTE — Progress Notes (Signed)
Heather Vaughan presents today for injection per MD orders. Neulasta 6mg  administered SQ in left Abdomen. Administration without incident. Patient tolerated well.   Tolerated transfusion without s/s adverse reaction.

## 2013-11-27 LAB — TYPE AND SCREEN
ABO/RH(D): O POS
Antibody Screen: NEGATIVE
UNIT DIVISION: 0
Unit division: 0

## 2013-12-13 ENCOUNTER — Inpatient Hospital Stay (HOSPITAL_COMMUNITY): Payer: BC Managed Care – PPO

## 2013-12-14 ENCOUNTER — Ambulatory Visit (HOSPITAL_COMMUNITY): Payer: BC Managed Care – PPO

## 2013-12-15 NOTE — Progress Notes (Addendum)
Holley, PA-C [address] [place] [address]  Invasive ductal carcinoma metastasized to axillary lymph node, left - Plan: CBC with Differential, Comprehensive metabolic panel  CURRENT THERAPY: S/P cycle 5 of TCH + Neulasta support beginning on 08/27/2013.   INTERVAL HISTORY: Heather Vaughan 58 y.o. female returns for  regular  visit for followup of Invasive ductal carcinoma of left breast with high grade DCIS. S/P left partial mastectomy on 07/03/2013 demonstrating a 1.4 cm invasive component that was ER 74%, PR 16%, Ki-67 52%, and HER2 negative. However, positive margins were noted and therefore on 07/17/2013 she underwent a left modified radical mastectomy and left axillary lymph node dissection. Clear margins were ascertained with 7/42 positive lymph nodes positive for metastatic disease. Testing on the positive lymph node reveals HER2 positivity. OncoType DX score was 29 placing her in the high-end of the intermediate risk group for recurrence. She was therefore started on [facility] therapy on 08/27/2013.    Invasive ductal carcinoma metastasized to axillary lymph node, left   05/28/2013 Imaging Screening mammogram   06/19/2013 Imaging Left diagnostic mammogram   06/25/2013 Imaging CT CAP- borderline enlarged axillary lymph node on left.  Small pulmonary nodules noted but too small for PET or biopsy; surveillance recommended.    07/03/2013 Surgery Left partial mastectomy.  Invasive ductal carcinoma. 1.4 cm.  POSITIVE MARGINS.  Additional extensive DCIS noted.  ER 74%, PR 16%, Ki-67 52%, HER2 NEGATIVE.   07/17/2013 Surgery Left modified radiacl mastectomy with left axillary node dissection.  Negative residual cancer.  Negative inked margins.  1/12 positive lymph nodes.  FINAL MARGINS NEGATIVE.  LYMPH NODE IS HER2 POSITIVE.   08/13/2013 Imaging MUGA scan shows left ventricular EF of 62%   08/27/2013 -  Chemotherapy TCH   I personally reviewed and went over laboratory  results with the patient.  The results are noted within this dictation.  She saw Dr. Lisbeth Renshaw in [place] and she notes that she did not enjoy her interaction with him.  She reports that he spoke to a level that was above her understanding level.  I had a long conversation with the patient, her husband, and her sister-in-law about radiation.  I reviewed the role of adjuvant chemotherapy and radiation.  We discussed the risks, benefits, alternatives (including no radiation), and side effects of radiation.  I provided her an extensive amount of education regarding radiation, its role, its indication, it mechanism of action, etc.  I explained the recommendation for radiation with regards to her oncology care.    After our long conversation, her husband reports that they will pray about this decision.  They will update me with their decision.  If is agreeable, I will get her to see another Radiation Oncologist.  Oncologically, she denies any complaints and ROS questioning is negative.    Past Medical History  Diagnosis Date  . GERD (gastroesophageal reflux disease)   . Hypertension   . Bronchitis 07/27/2011  . Barrett's esophagus   . Anxiety   . Breast cancer, left breast     has GERD (gastroesophageal reflux disease); Hypertension; Barrett esophagus; Invasive ductal carcinoma metastasized to axillary lymph node, left; and Anemia associated with chemotherapy on her problem list.     has No Known Allergies.  Heather Vaughan does not currently have medications on file.  Past Surgical History  Procedure Laterality Date  . Excision morton's neuroma  07/14/2011    Procedure: EXCISION MORTON'S NEUROMA;  Surgeon: Marcheta Grammes;  Location: [facility];  Service: Orthopedics;  Laterality: Right;  Removal Neuroma 3rd Interspace Right Foot  . Esophagogastroduodenoscopy  08/03/2011    Procedure: ESOPHAGOGASTRODUODENOSCOPY (EGD);  Surgeon: Rogene Houston, MD;  Location: [facility];  Service: Endoscopy;   Laterality: N/A;  12:45  . Partial mastectomy with needle localization Left 07/03/2013    Procedure: PARTIAL MASTECTOMY STATUS POST NEEDLE LOCALIZATION;  Surgeon: Scherry Ran, MD;  Location: [facility];  Service: General;  Laterality: Left;  . Breast biopsy Bilateral     X 4- benign  . Mastectomy modified radical Left 07/17/2013    Procedure: MASTECTOMY MODIFIED RADICAL WITH LEFT AXILLARY TISSUE REMOVAL;  Surgeon: Scherry Ran, MD;  Location: [facility];  Service: General;  Laterality: Left;  . Portacath placement Right 08/16/2013    Procedure: INSERTION PORT-A-CATH RIGHT SUBCLAVIAN;  Surgeon: Scherry Ran, MD;  Location: [facility];  Service: General;  Laterality: Right;    Denies any headaches, dizziness, double vision, fevers, chills, night sweats, nausea, vomiting, diarrhea, constipation, chest pain, heart palpitations, shortness of breath, blood in stool, black tarry stool, urinary pain, urinary burning, urinary frequency, hematuria.   PHYSICAL EXAMINATION  ECOG PERFORMANCE STATUS: 0 - Asymptomatic  There were no vitals filed for this visit.  GENERAL:alert, no distress, well nourished, well developed, comfortable, cooperative and smiling SKIN: skin color, texture, turgor are normal, no rashes or significant lesions HEAD: Normocephalic, No masses, lesions, tenderness or abnormalities EYES: normal, PERRLA, EOMI, Conjunctiva are pink and non-injected EARS: External ears normal OROPHARYNX:mucous membranes are moist  NECK: trachea midline LYMPH:  not examined BREAST:not examined LUNGS: examined HEART: not examined ABDOMEN:not examined BACK: Back symmetric, no curvature. EXTREMITIES:less then 2 second capillary refill, no joint deformities, effusion, or inflammation, no clubbing, no cyanosis, positive findings:  edema 1+ B/L pitting edema of ankle and feet   NEURO: alert & oriented x 3 with fluent speech, no focal motor/sensory deficits, gait normal   LABORATORY DATA: CBC     Component Value Date/Time   WBC 7.9 12/18/2013 0906   RBC 2.60* 12/18/2013 0906   HGB 9.1* 12/18/2013 0906   HCT 26.1* 12/18/2013 0906   PLT 239 12/18/2013 0906   MCV 100.4* 12/18/2013 0906   MCH 35.0* 12/18/2013 0906   MCHC 34.9 12/18/2013 0906   RDW 16.2* 12/18/2013 0906   LYMPHSABS 0.8 12/18/2013 0906   MONOABS 0.4 12/18/2013 0906   EOSABS 0.0 12/18/2013 0906   BASOSABS 0.0 12/18/2013 0906      Chemistry      Component Value Date/Time   NA 141 12/18/2013 0906   K 4.3 12/18/2013 0906   CL 104 12/18/2013 0906   CO2 25 12/18/2013 0906   BUN 29* 12/18/2013 0906   CREATININE 0.81 12/18/2013 0906      Component Value Date/Time   CALCIUM 9.8 12/18/2013 0906   ALKPHOS 87 12/18/2013 0906   AST 15 12/18/2013 0906   ALT 18 12/18/2013 0906   BILITOT 0.2* 12/18/2013 0906         ASSESSMENT:  1. 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 S/P left partial mastectomy on 07/03/2013 demonstrating a 1.4 cm invasive component that was ER 74%, PR 16%, Ki-67 52%, and HER2 negative. However, positive margins were noted and therefore on 07/17/2013 she underwent a left modified radical mastectomy and left axillary lymph node dissection. Clear margins were ascertained with 0/93 positive lymph nodes positive for metastatic disease. Testing on the positive lymph node reveals HER2 positivity. OncoType DX score was 29 placing  her in the high-end of the intermediate risk group for recurrence. She was therefore started on [facility] therapy on 08/27/2013.  2. Tobacco abuse, 20 pack year smoking history  3. Pulmonary nodules, likely benign. Future surveillance is recommended per radiologist reading report.  4. Peripheral neuropathy well-controlled with gabapentin 300 mg twice a day.  5. B/L LE edema  6. Chemotherapy-induced anemia, S/P 2 units of PRBCs on 11/25/2013  Patient Active Problem List   Diagnosis Date Noted  . Anemia associated with chemotherapy 11/25/2013  . Invasive ductal carcinoma metastasized  to axillary lymph node, left 08/09/2013  . Barrett esophagus 12/22/2011  . GERD (gastroesophageal reflux disease) 06/23/2011  . Hypertension 06/23/2011      PLAN:  1. I personally reviewed and went over laboratory results with the patient. The results are noted within this dictation. 2. Pre-chemo labs: CBC diff, CMET 3. MUGA in June 2015 4. Long discussion regarding radiation therapy 5. Long discussion regarding chemotherapy 6. Recommend radiation therapy as planned.  7. She will contact us reagrding her radiation therapy decision. 8. With her approval, I have messaged Dr. Pablo Ledger and Dr. Isidore Moos. 9. Return in 3-4 weeks for follow-up and to start AI therapy.   THERAPY PLAN:  Heather Vaughan's treatment plan is to complete 6 cycles of TCH with 52 weeks worth of Herceptin. Today is cycle 6 and therefore she is completing TC today.  It is recommended that she undergo radiation therapy, but she is not sure about this intervention following her consultation with Dr. Lisbeth Renshaw.  She is going to think following our conversation today about her treatment options.  We will start an AI in 3 weeks when she returns for follow-up and Herceptin.    All questions were answered. The patient knows to call the clinic with any problems, questions or concerns. We can certainly see the patient much sooner if necessary.  Patient and plan discussed with Dr. Farrel Gobble and he is in agreement with the aforementioned.   I spent >40 minutes counseling the patient face to face. The total time spent in the appointment was 60 minutes.  Baird Cancer 12/18/2013    Addendum: On the patient's departure from the clinic, she reported to me that she will do radiation and she looks forward to meeting Dr. Pablo Ledger or Dr. Isidore Moos.  Baird Cancer 12/18/2013

## 2013-12-16 ENCOUNTER — Ambulatory Visit (HOSPITAL_COMMUNITY): Payer: BC Managed Care – PPO | Admitting: Oncology

## 2013-12-18 ENCOUNTER — Encounter (HOSPITAL_BASED_OUTPATIENT_CLINIC_OR_DEPARTMENT_OTHER): Payer: BC Managed Care – PPO | Admitting: Oncology

## 2013-12-18 ENCOUNTER — Encounter (HOSPITAL_COMMUNITY): Payer: BC Managed Care – PPO | Attending: Hematology and Oncology

## 2013-12-18 VITALS — BP 122/76 | HR 91 | Temp 98.2°F | Resp 16

## 2013-12-18 DIAGNOSIS — C773 Secondary and unspecified malignant neoplasm of axilla and upper limb lymph nodes: Secondary | ICD-10-CM | POA: Insufficient documentation

## 2013-12-18 DIAGNOSIS — D6481 Anemia due to antineoplastic chemotherapy: Secondary | ICD-10-CM

## 2013-12-18 DIAGNOSIS — Z5112 Encounter for antineoplastic immunotherapy: Secondary | ICD-10-CM

## 2013-12-18 DIAGNOSIS — Z5111 Encounter for antineoplastic chemotherapy: Secondary | ICD-10-CM

## 2013-12-18 DIAGNOSIS — T451X5A Adverse effect of antineoplastic and immunosuppressive drugs, initial encounter: Secondary | ICD-10-CM

## 2013-12-18 DIAGNOSIS — C50919 Malignant neoplasm of unspecified site of unspecified female breast: Secondary | ICD-10-CM

## 2013-12-18 DIAGNOSIS — G609 Hereditary and idiopathic neuropathy, unspecified: Secondary | ICD-10-CM

## 2013-12-18 DIAGNOSIS — I10 Essential (primary) hypertension: Secondary | ICD-10-CM

## 2013-12-18 LAB — CBC WITH DIFFERENTIAL/PLATELET
Basophils Absolute: 0 10*3/uL (ref 0.0–0.1)
Basophils Relative: 0 % (ref 0–1)
Eosinophils Absolute: 0 10*3/uL (ref 0.0–0.7)
Eosinophils Relative: 0 % (ref 0–5)
HCT: 26.1 % — ABNORMAL LOW (ref 36.0–46.0)
HEMOGLOBIN: 9.1 g/dL — AB (ref 12.0–15.0)
LYMPHS ABS: 0.8 10*3/uL (ref 0.7–4.0)
LYMPHS PCT: 11 % — AB (ref 12–46)
MCH: 35 pg — ABNORMAL HIGH (ref 26.0–34.0)
MCHC: 34.9 g/dL (ref 30.0–36.0)
MCV: 100.4 fL — ABNORMAL HIGH (ref 78.0–100.0)
MONOS PCT: 5 % (ref 3–12)
Monocytes Absolute: 0.4 10*3/uL (ref 0.1–1.0)
NEUTROS PCT: 84 % — AB (ref 43–77)
Neutro Abs: 6.6 10*3/uL (ref 1.7–7.7)
Platelets: 239 10*3/uL (ref 150–400)
RBC: 2.6 MIL/uL — AB (ref 3.87–5.11)
RDW: 16.2 % — ABNORMAL HIGH (ref 11.5–15.5)
WBC: 7.9 10*3/uL (ref 4.0–10.5)

## 2013-12-18 LAB — COMPREHENSIVE METABOLIC PANEL
ALT: 18 U/L (ref 0–35)
AST: 15 U/L (ref 0–37)
Albumin: 3.8 g/dL (ref 3.5–5.2)
Alkaline Phosphatase: 87 U/L (ref 39–117)
BILIRUBIN TOTAL: 0.2 mg/dL — AB (ref 0.3–1.2)
BUN: 29 mg/dL — ABNORMAL HIGH (ref 6–23)
CHLORIDE: 104 meq/L (ref 96–112)
CO2: 25 meq/L (ref 19–32)
Calcium: 9.8 mg/dL (ref 8.4–10.5)
Creatinine, Ser: 0.81 mg/dL (ref 0.50–1.10)
GFR calc Af Amer: >90 mL/min (ref >90)
GFR, EST NON AFRICAN AMERICAN: 78 mL/min — AB (ref >90)
GLUCOSE: 118 mg/dL — AB (ref 70–99)
POTASSIUM: 4.3 meq/L (ref 3.7–5.3)
SODIUM: 141 meq/L (ref 137–147)
Total Protein: 6.5 g/dL (ref 6.0–8.3)

## 2013-12-18 MED ORDER — TRASTUZUMAB CHEMO INJECTION 440 MG
6.0000 mg/kg | Freq: Once | INTRAVENOUS | Status: AC
  Administered 2013-12-18: 420 mg via INTRAVENOUS
  Filled 2013-12-18: qty 20

## 2013-12-18 MED ORDER — ACETAMINOPHEN 325 MG PO TABS
ORAL_TABLET | ORAL | Status: AC
  Filled 2013-12-18: qty 2

## 2013-12-18 MED ORDER — SODIUM CHLORIDE 0.9 % IV SOLN
Freq: Once | INTRAVENOUS | Status: AC
  Administered 2013-12-18: 16 mg via INTRAVENOUS
  Filled 2013-12-18: qty 8

## 2013-12-18 MED ORDER — ACETAMINOPHEN 325 MG PO TABS
650.0000 mg | ORAL_TABLET | Freq: Once | ORAL | Status: AC
  Administered 2013-12-18: 650 mg via ORAL

## 2013-12-18 MED ORDER — DIPHENHYDRAMINE HCL 25 MG PO CAPS
ORAL_CAPSULE | ORAL | Status: AC
  Filled 2013-12-18: qty 2

## 2013-12-18 MED ORDER — DOCETAXEL CHEMO INJECTION 160 MG/16ML
75.0000 mg/m2 | Freq: Once | INTRAVENOUS | Status: AC
  Administered 2013-12-18: 130 mg via INTRAVENOUS
  Filled 2013-12-18: qty 13

## 2013-12-18 MED ORDER — HEPARIN SOD (PORK) LOCK FLUSH 100 UNIT/ML IV SOLN
500.0000 [IU] | Freq: Once | INTRAVENOUS | Status: AC | PRN
  Administered 2013-12-18: 500 [IU]
  Filled 2013-12-18: qty 5

## 2013-12-18 MED ORDER — DIPHENHYDRAMINE HCL 25 MG PO CAPS
50.0000 mg | ORAL_CAPSULE | Freq: Once | ORAL | Status: AC
  Administered 2013-12-18: 50 mg via ORAL

## 2013-12-18 MED ORDER — SODIUM CHLORIDE 0.9 % IV SOLN
Freq: Once | INTRAVENOUS | Status: AC
  Administered 2013-12-18: 10:00:00 via INTRAVENOUS

## 2013-12-18 MED ORDER — SODIUM CHLORIDE 0.9 % IV SOLN
570.0000 mg | Freq: Once | INTRAVENOUS | Status: AC
  Administered 2013-12-18: 570 mg via INTRAVENOUS
  Filled 2013-12-18: qty 57

## 2013-12-18 MED ORDER — SODIUM CHLORIDE 0.9 % IJ SOLN: 10.0000 mL | INTRAMUSCULAR | Status: DC | PRN

## 2013-12-18 NOTE — Progress Notes (Signed)
Tolerated well

## 2013-12-18 NOTE — Patient Instructions (Addendum)
[place] Discharge Instructions  RECOMMENDATIONS MADE BY THE CONSULTANT AND ANY TEST RESULTS WILL BE SENT TO YOUR REFERRING PHYSICIAN.  EXAM FINDINGS BY THE PHYSICIAN TODAY AND SIGNS OR SYMPTOMS TO REPORT TO CLINIC OR PRIMARY PHYSICIAN:   Return in 3-4 weeks to see doctor.  We will start an Aromatase Inhibitor in 3-4 weeks.   You will continue to get Herceptin every 3 weeks for 6 months.   Labs every 3 weeks with Herceptin.  You will still get MUGA scans q12 weeks.     Thank you for choosing [facility] to provide your oncology and hematology care.  To afford each patient quality time with our providers, please arrive at least 15 minutes before your scheduled appointment time.  With your help, our goal is to use those 15 minutes to complete the necessary work-up to ensure our physicians have the information they need to help with your evaluation and healthcare recommendations.    Effective January 1st, 2014, we ask that you re-schedule your appointment with our physicians should you arrive 10 or more minutes late for your appointment.  We strive to give you quality time with our providers, and arriving late affects you and other patients whose appointments are after yours.    Again, thank you for choosing [facility].  Our hope is that these requests will decrease the amount of time that you wait before being seen by our physicians.       _____________________________________________________________  Should you have questions after your visit to [facility], please contact our office at [phone] between the hours of 8:30 a.m. and 5:00 p.m.  Voicemails left after 4:30 p.m. will not be returned until the following business day.  For prescription refill requests, have your pharmacy contact our office with your prescription refill request.

## 2013-12-19 ENCOUNTER — Other Ambulatory Visit (HOSPITAL_COMMUNITY): Payer: Self-pay | Admitting: Hematology and Oncology

## 2013-12-19 ENCOUNTER — Ambulatory Visit (HOSPITAL_COMMUNITY): Payer: BC Managed Care – PPO

## 2013-12-19 ENCOUNTER — Encounter (HOSPITAL_BASED_OUTPATIENT_CLINIC_OR_DEPARTMENT_OTHER): Payer: BC Managed Care – PPO

## 2013-12-19 ENCOUNTER — Telehealth (HOSPITAL_COMMUNITY): Payer: Self-pay | Admitting: *Deleted

## 2013-12-19 DIAGNOSIS — C50919 Malignant neoplasm of unspecified site of unspecified female breast: Secondary | ICD-10-CM

## 2013-12-19 DIAGNOSIS — Z5189 Encounter for other specified aftercare: Secondary | ICD-10-CM

## 2013-12-19 DIAGNOSIS — C773 Secondary and unspecified malignant neoplasm of axilla and upper limb lymph nodes: Secondary | ICD-10-CM

## 2013-12-19 MED ORDER — PEGFILGRASTIM INJECTION 6 MG/0.6ML
6.0000 mg | Freq: Once | SUBCUTANEOUS | Status: AC
  Administered 2013-12-19: 6 mg via SUBCUTANEOUS

## 2013-12-19 MED ORDER — PEGFILGRASTIM INJECTION 6 MG/0.6ML
SUBCUTANEOUS | Status: AC
  Filled 2013-12-19: qty 0.6

## 2013-12-19 NOTE — Progress Notes (Signed)
Heather Vaughan presents today for injection per MD orders. Neulasta 6mg  administered SQ in left Abdomen. Administration without incident. Patient tolerated well.

## 2014-01-07 NOTE — Progress Notes (Addendum)
Heather Cheadle, PA-C [address] [place] [address]  Invasive ductal carcinoma metastasized to axillary lymph node, left - Plan: exemestane (AROMASIN) 25 MG tablet  Right ankle pain - Plan: traMADol (ULTRAM) 50 MG tablet, DG Ankle 2 Views Right  Bilateral lower extremity edema  CURRENT THERAPY: Completing 52 weeks worth of Herceptin therapy.  S/P cycle 6 of TCH + Neulasta support.  INTERVAL HISTORY: Heather Vaughan 58 y.o. female returns for  regular  visit for followup of Invasive ductal carcinoma of left breast with high grade DCIS. S/P left partial mastectomy on 07/03/2013 demonstrating a 1.4 cm invasive component that was ER 74%, PR 16%, Ki-67 52%, and HER2 negative. However, positive margins were noted and therefore on 07/17/2013 she underwent a left modified radical mastectomy and left axillary lymph node dissection. Clear margins were ascertained with 3/00 positive lymph nodes positive for metastatic disease. Testing on the positive lymph node reveals HER2 positivity. OncoType DX score was 29 placing her in the high-end of the intermediate risk group for recurrence. She was therefore started on [facility] therapy on 08/27/2013 and finished TC on 12/18/2013 after 6 cycles.  She is now completing 52 weeks worth of Herceptin therapy and moving on to radiation therapy.  To start Aromasin in near future    Invasive ductal carcinoma metastasized to axillary lymph node, left   05/28/2013 Imaging Screening mammogram   06/19/2013 Imaging Left diagnostic mammogram   06/25/2013 Imaging CT CAP- borderline enlarged axillary lymph node on left.  Small pulmonary nodules noted but too small for PET or biopsy; surveillance recommended.    07/03/2013 Surgery Left partial mastectomy.  Invasive ductal carcinoma. 1.4 cm.  POSITIVE MARGINS.  Additional extensive DCIS noted.  ER 74%, PR 16%, Ki-67 52%, HER2 NEGATIVE.   07/17/2013 Surgery Left modified radiacl mastectomy with left axillary node  dissection.  Negative residual cancer.  Negative inked margins.  1/12 positive lymph nodes.  FINAL MARGINS NEGATIVE.  LYMPH NODE IS HER2 POSITIVE.   08/13/2013 Imaging MUGA scan shows left ventricular EF of 62%   08/27/2013 - 12/18/2013 Chemotherapy TCH x 6 cycles   01/07/2014 -  Chemotherapy Herceptin x 52 weeks    She has seen Dr. Pablo Ledger ([place]) and the patient reports a much improved experience.  She is pleased to move forward with radiation therapy and this will commence in the near future.    I have a long discussion regarding AI therapy with the patient.  We discussed the risks, benefits, alternatives, and side effects of therapy including hot flashes, joint aches, and muscles aches, increased risk of osteoporosis, and secondary malignancy.  She is agreeable to pursue this treatment choice.   She reports a right ankle pain that is on the lateral aspect of ankle inferior to lateral malleolus and anterior to this bony prominence.  She denies any tenderness to deep palpation.  She notes weight bearing increases the pain.  She denies any trauma.  She take Tylenol and ibuprofen are minimally effective for pain control.  I will order an xray to rule out fracture.   Oncologically, she denies any complaints and ROS questioning is negative.   Past Medical History  Diagnosis Date  . GERD (gastroesophageal reflux disease)   . Hypertension   . Bronchitis 07/27/2011  . Barrett's esophagus   . Anxiety   . Breast cancer, left breast     has GERD (gastroesophageal reflux disease); Hypertension; Barrett esophagus; Invasive ductal carcinoma metastasized to axillary lymph node, left; and Anemia  associated with chemotherapy on her problem list.     has No Known Allergies.  [name] had no medications administered during this visit.  Past Surgical History  Procedure Laterality Date  . Excision morton's neuroma  07/14/2011    Procedure: EXCISION MORTON'S NEUROMA;  Surgeon: Marcheta Grammes;  Location: [facility];  Service: Orthopedics;  Laterality: Right;  Removal Neuroma 3rd Interspace Right Foot  . Esophagogastroduodenoscopy  08/03/2011    Procedure: ESOPHAGOGASTRODUODENOSCOPY (EGD);  Surgeon: Rogene Houston, MD;  Location: [facility];  Service: Endoscopy;  Laterality: N/A;  12:45  . Partial mastectomy with needle localization Left 07/03/2013    Procedure: PARTIAL MASTECTOMY STATUS POST NEEDLE LOCALIZATION;  Surgeon: Scherry Ran, MD;  Location: [facility];  Service: General;  Laterality: Left;  . Breast biopsy Bilateral     X 4- benign  . Mastectomy modified radical Left 07/17/2013    Procedure: MASTECTOMY MODIFIED RADICAL WITH LEFT AXILLARY TISSUE REMOVAL;  Surgeon: Scherry Ran, MD;  Location: [facility];  Service: General;  Laterality: Left;  . Portacath placement Right 08/16/2013    Procedure: INSERTION PORT-A-CATH RIGHT SUBCLAVIAN;  Surgeon: Scherry Ran, MD;  Location: [facility];  Service: General;  Laterality: Right;    Denies any headaches, dizziness, double vision, fevers, chills, night sweats, nausea, vomiting, diarrhea, constipation, chest pain, heart palpitations, shortness of breath, blood in stool, black tarry stool, urinary pain, urinary burning, urinary frequency, hematuria.   PHYSICAL EXAMINATION  ECOG PERFORMANCE STATUS: 1 - Symptomatic but completely ambulatory  Filed Vitals:   01/10/14 1110  BP: 110/63  Pulse: 87  Temp: 97 F (36.1 C)  Resp: 20    GENERAL:alert, no distress, well nourished, well developed, comfortable, cooperative, obese and smiling SKIN: skin color, texture, turgor are normal, no rashes or significant lesions HEAD: Normocephalic, No masses, lesions, tenderness or abnormalities EYES: normal, PERRLA, EOMI, Conjunctiva are pink and non-injected EARS: External ears normal OROPHARYNX:mucous membranes are moist  NECK: supple, trachea midline LYMPH:  not examined BREAST:not examined LUNGS: not examined HEART: not  examined ABDOMEN:not examined BACK: Back symmetric, no curvature. EXTREMITIES:less then 2 second capillary refill, no joint deformities, effusion, or inflammation, no skin discoloration, no clubbing, no cyanosis, positive findings:  edema Bilateral 2+ pitting edema pre-tibially, ankle, and pedal and right lateral ankle tenderness with weight bearing inferior to lateral malleolus and anterior to this bony prominence NEURO: alert & oriented x 3 with fluent speech, no focal motor/sensory deficits, gait normal    LABORATORY DATA: CBC    Component Value Date/Time   WBC 3.6* 01/10/2014 1101   RBC 2.27* 01/10/2014 1101   HGB 7.8* 01/10/2014 1101   HCT 23.5* 01/10/2014 1101   PLT 210 01/10/2014 1101   MCV 103.5* 01/10/2014 1101   MCH 34.4* 01/10/2014 1101   MCHC 33.2 01/10/2014 1101   RDW 19.1* 01/10/2014 1101   LYMPHSABS 1.2 01/10/2014 1101   MONOABS 0.4 01/10/2014 1101   EOSABS 0.2 01/10/2014 1101   BASOSABS 0.0 01/10/2014 1101      Chemistry      Component Value Date/Time   NA 142 01/10/2014 1101   K 3.5* 01/10/2014 1101   CL 103 01/10/2014 1101   CO2 25 01/10/2014 1101   BUN 14 01/10/2014 1101   CREATININE 1.01 01/10/2014 1101      Component Value Date/Time   CALCIUM 8.9 01/10/2014 1101   ALKPHOS 112 01/10/2014 1101   AST 18 01/10/2014 1101   ALT 19 01/10/2014 1101  BILITOT 0.2* 01/10/2014 1101        ASSESSMENT:  1. 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 S/P left partial mastectomy on 07/03/2013 demonstrating a 1.4 cm invasive component that was ER 74%, PR 16%, Ki-67 52%, and HER2 negative. However, positive margins were noted and therefore on 07/17/2013 she underwent a left modified radical mastectomy and left axillary lymph node dissection. Clear margins were ascertained with 7/62 positive lymph nodes positive for metastatic disease. Testing on the positive lymph node reveals HER2 positivity. OncoType DX score was 29 placing her in the high-end of the intermediate risk  group for recurrence. She is S/P TCH therapy x 6 cycles and is now on Herceptin x 52 weeks.  She is to embark on radiation therapy and aromatase inhibitor in the near future.   2. Tobacco abuse, 20 pack year smoking history  3. Pulmonary nodules, likely benign. Future surveillance is recommended per radiologist reading report.  4. Peripheral neuropathy well-controlled with gabapentin 300 mg twice a day.  5. Chemotherapy-induced anemia, S/P 2 units of PRBCs on 11/25/2013 6. Right ankle pain  Patient Active Problem List   Diagnosis Date Noted  . Anemia associated with chemotherapy 11/25/2013  . Invasive ductal carcinoma metastasized to axillary lymph node, left 08/09/2013  . Barrett esophagus 12/22/2011  . GERD (gastroesophageal reflux disease) 06/23/2011  . Hypertension 06/23/2011     PLAN:  1. I personally reviewed and went over laboratory results with the patient.  The results are noted within this dictation. 2. MUGA in June 2015 as ordered 3. Pre-chemo labs as ordered 4. Continue Herceptin as planned for 52 weeks. 5. Embark on radiation as directed by Dr. Pablo Ledger 6. Start AI therapy, Aromasin in the near future. 7. Rx for Exemestane printed for the patient. 8. Rx for Tramadol for ankle pain #45 with 0 refills.  9. DG right ankle for ankle pain 10. Bone density will need to be ordered and performed in the near future.  I will order and schedule with next follow-up appointment.  11. Continue LE elevation and fluid pills as directed 12. Return in 3 weeks for follow-up.   THERAPY PLAN:  Annagrace completed 6 cycles of [place] on 12/18/2013.  She is now completing 52 weeks worth of Herceptin.  She will be moving on to radiation in the very near future and she will start AI therapy soon.    All questions were answered. The patient knows to call the clinic with any problems, questions or concerns. We can certainly see the patient much sooner if necessary.  Patient and plan discussed with  Dr. Farrel Gobble and he is in agreement with the aforementioned.   Baird Cancer 01/10/2014    Addendum:  The patient's labs were reviewed following today's visit and she is noted to be anemic at 7.8 g/dL with a macrocytosis.  This could be secondary to chemotherapy that finished 3 weeks ago.  Since she is due for radiation in the near future, we will set her up for a 2 unit PRBC transfusion and anemia panel to side-step any decreased efficacy of radiation secondary to anemia.   Baird Cancer 01/10/2014

## 2014-01-10 ENCOUNTER — Encounter (HOSPITAL_BASED_OUTPATIENT_CLINIC_OR_DEPARTMENT_OTHER): Payer: BC Managed Care – PPO | Admitting: Oncology

## 2014-01-10 ENCOUNTER — Encounter (HOSPITAL_COMMUNITY): Payer: BC Managed Care – PPO | Attending: Hematology and Oncology

## 2014-01-10 VITALS — BP 102/61 | HR 82 | Temp 97.2°F | Resp 16

## 2014-01-10 VITALS — BP 110/63 | HR 87 | Temp 97.0°F | Resp 20 | Wt 155.8 lb

## 2014-01-10 DIAGNOSIS — M25579 Pain in unspecified ankle and joints of unspecified foot: Secondary | ICD-10-CM | POA: Insufficient documentation

## 2014-01-10 DIAGNOSIS — R600 Localized edema: Secondary | ICD-10-CM

## 2014-01-10 DIAGNOSIS — C773 Secondary and unspecified malignant neoplasm of axilla and upper limb lymph nodes: Secondary | ICD-10-CM

## 2014-01-10 DIAGNOSIS — M25571 Pain in right ankle and joints of right foot: Secondary | ICD-10-CM

## 2014-01-10 DIAGNOSIS — G609 Hereditary and idiopathic neuropathy, unspecified: Secondary | ICD-10-CM

## 2014-01-10 DIAGNOSIS — D539 Nutritional anemia, unspecified: Secondary | ICD-10-CM

## 2014-01-10 DIAGNOSIS — D6481 Anemia due to antineoplastic chemotherapy: Secondary | ICD-10-CM

## 2014-01-10 DIAGNOSIS — C50919 Malignant neoplasm of unspecified site of unspecified female breast: Secondary | ICD-10-CM | POA: Insufficient documentation

## 2014-01-10 DIAGNOSIS — R911 Solitary pulmonary nodule: Secondary | ICD-10-CM

## 2014-01-10 DIAGNOSIS — T451X5A Adverse effect of antineoplastic and immunosuppressive drugs, initial encounter: Secondary | ICD-10-CM

## 2014-01-10 DIAGNOSIS — Z5112 Encounter for antineoplastic immunotherapy: Secondary | ICD-10-CM

## 2014-01-10 DIAGNOSIS — F17200 Nicotine dependence, unspecified, uncomplicated: Secondary | ICD-10-CM

## 2014-01-10 LAB — CBC WITH DIFFERENTIAL/PLATELET
BASOS PCT: 1 % (ref 0–1)
Basophils Absolute: 0 10*3/uL (ref 0.0–0.1)
EOS PCT: 5 % (ref 0–5)
Eosinophils Absolute: 0.2 10*3/uL (ref 0.0–0.7)
HEMATOCRIT: 23.5 % — AB (ref 36.0–46.0)
Hemoglobin: 7.8 g/dL — ABNORMAL LOW (ref 12.0–15.0)
LYMPHS PCT: 33 % (ref 12–46)
Lymphs Abs: 1.2 10*3/uL (ref 0.7–4.0)
MCH: 34.4 pg — ABNORMAL HIGH (ref 26.0–34.0)
MCHC: 33.2 g/dL (ref 30.0–36.0)
MCV: 103.5 fL — AB (ref 78.0–100.0)
MONO ABS: 0.4 10*3/uL (ref 0.1–1.0)
Monocytes Relative: 10 % (ref 3–12)
NEUTROS ABS: 1.9 10*3/uL (ref 1.7–7.7)
Neutrophils Relative %: 51 % (ref 43–77)
PLATELETS: 210 10*3/uL (ref 150–400)
RBC: 2.27 MIL/uL — ABNORMAL LOW (ref 3.87–5.11)
RDW: 19.1 % — ABNORMAL HIGH (ref 11.5–15.5)
WBC: 3.6 10*3/uL — AB (ref 4.0–10.5)

## 2014-01-10 LAB — COMPREHENSIVE METABOLIC PANEL
ALBUMIN: 3.8 g/dL (ref 3.5–5.2)
ALT: 19 U/L (ref 0–35)
AST: 18 U/L (ref 0–37)
Alkaline Phosphatase: 112 U/L (ref 39–117)
BUN: 14 mg/dL (ref 6–23)
CALCIUM: 8.9 mg/dL (ref 8.4–10.5)
CO2: 25 meq/L (ref 19–32)
CREATININE: 1.01 mg/dL (ref 0.50–1.10)
Chloride: 103 mEq/L (ref 96–112)
GFR calc Af Amer: 69 mL/min — ABNORMAL LOW (ref >90)
GFR calc non Af Amer: 60 mL/min — ABNORMAL LOW (ref >90)
Glucose, Bld: 134 mg/dL — ABNORMAL HIGH (ref 70–99)
Potassium: 3.5 mEq/L — ABNORMAL LOW (ref 3.7–5.3)
SODIUM: 142 meq/L (ref 137–147)
Total Bilirubin: 0.2 mg/dL — ABNORMAL LOW (ref 0.3–1.2)
Total Protein: 6.8 g/dL (ref 6.0–8.3)

## 2014-01-10 LAB — PREPARE RBC (CROSSMATCH)

## 2014-01-10 MED ORDER — SODIUM CHLORIDE 0.9 % IJ SOLN
10.0000 mL | INTRAMUSCULAR | Status: DC | PRN
  Administered 2014-01-10: 10 mL

## 2014-01-10 MED ORDER — TRAMADOL HCL 50 MG PO TABS: 50.0000 mg | ORAL_TABLET | Freq: Four times a day (QID) | ORAL | Status: DC | PRN

## 2014-01-10 MED ORDER — SODIUM CHLORIDE 0.9 % IV SOLN
Freq: Once | INTRAVENOUS | Status: AC
  Administered 2014-01-10: 11:00:00 via INTRAVENOUS

## 2014-01-10 MED ORDER — HEPARIN SOD (PORK) LOCK FLUSH 100 UNIT/ML IV SOLN
500.0000 [IU] | Freq: Once | INTRAVENOUS | Status: AC | PRN
  Administered 2014-01-10: 500 [IU]
  Filled 2014-01-10: qty 5

## 2014-01-10 MED ORDER — EXEMESTANE 25 MG PO TABS: 25.0000 mg | ORAL_TABLET | Freq: Every day | ORAL | Status: DC

## 2014-01-10 MED ORDER — ACETAMINOPHEN 325 MG PO TABS
650.0000 mg | ORAL_TABLET | Freq: Once | ORAL | Status: AC
  Administered 2014-01-10: 650 mg via ORAL
  Filled 2014-01-10: qty 2

## 2014-01-10 MED ORDER — TRASTUZUMAB CHEMO INJECTION 440 MG
6.0000 mg/kg | Freq: Once | INTRAVENOUS | Status: AC
  Administered 2014-01-10: 420 mg via INTRAVENOUS
  Filled 2014-01-10: qty 20

## 2014-01-10 MED ORDER — DIPHENHYDRAMINE HCL 25 MG PO CAPS
50.0000 mg | ORAL_CAPSULE | Freq: Once | ORAL | Status: AC
  Administered 2014-01-10: 50 mg via ORAL
  Filled 2014-01-10: qty 2

## 2014-01-10 NOTE — Patient Instructions (Signed)
[facility] Discharge Instructions  RECOMMENDATIONS MADE BY THE CONSULTANT AND ANY TEST RESULTS WILL BE SENT TO YOUR REFERRING PHYSICIAN.  Return for ankle x-ray at your convenience Follow-up visit with Tom and herceptin infusion in 3 weeks   Thank you for choosing [facility] to provide your oncology and hematology care.  To afford each patient quality time with our providers, please arrive at least 15 minutes before your scheduled appointment time.  With your help, our goal is to use those 15 minutes to complete the necessary work-up to ensure our physicians have the information they need to help with your evaluation and healthcare recommendations.    Effective January 1st, 2014, we ask that you re-schedule your appointment with our physicians should you arrive 10 or more minutes late for your appointment.  We strive to give you quality time with our providers, and arriving late affects you and other patients whose appointments are after yours.    Again, thank you for choosing [facility].  Our hope is that these requests will decrease the amount of time that you wait before being seen by our physicians.       _____________________________________________________________  Should you have questions after your visit to [facility], please contact our office at [phone] between the hours of 8:30 a.m. and 5:00 p.m.  Voicemails left after 4:30 p.m. will not be returned until the following business day.  For prescription refill requests, have your pharmacy contact our office with your prescription refill request.

## 2014-01-10 NOTE — Addendum Note (Signed)
Addended by: Baird Cancer on: 01/10/2014 02:12 PM   Modules accepted: Orders, Level of Service, SmartSet

## 2014-01-10 NOTE — Addendum Note (Signed)
Addended by: Berneta Levins on: 01/10/2014 02:28 PM   Modules accepted: Orders

## 2014-01-13 ENCOUNTER — Encounter (HOSPITAL_BASED_OUTPATIENT_CLINIC_OR_DEPARTMENT_OTHER): Payer: BC Managed Care – PPO

## 2014-01-13 VITALS — BP 109/52 | HR 99 | Temp 98.2°F | Resp 18

## 2014-01-13 DIAGNOSIS — C773 Secondary and unspecified malignant neoplasm of axilla and upper limb lymph nodes: Secondary | ICD-10-CM

## 2014-01-13 DIAGNOSIS — D539 Nutritional anemia, unspecified: Secondary | ICD-10-CM

## 2014-01-13 DIAGNOSIS — C50919 Malignant neoplasm of unspecified site of unspecified female breast: Secondary | ICD-10-CM

## 2014-01-13 DIAGNOSIS — T451X5A Adverse effect of antineoplastic and immunosuppressive drugs, initial encounter: Secondary | ICD-10-CM

## 2014-01-13 DIAGNOSIS — D6481 Anemia due to antineoplastic chemotherapy: Secondary | ICD-10-CM

## 2014-01-13 LAB — IRON AND TIBC
Iron: 93 ug/dL (ref 42–135)
SATURATION RATIOS: 27 % (ref 20–55)
TIBC: 347 ug/dL (ref 250–470)
UIBC: 254 ug/dL (ref 125–400)

## 2014-01-13 LAB — RETICULOCYTES
RBC.: 2.41 MIL/uL — AB (ref 3.87–5.11)
Retic Count, Absolute: 151.8 10*3/uL (ref 19.0–186.0)
Retic Ct Pct: 6.3 % — ABNORMAL HIGH (ref 0.4–3.1)

## 2014-01-13 LAB — VITAMIN B12: VITAMIN B 12: 1709 pg/mL — AB (ref 211–911)

## 2014-01-13 LAB — FOLATE: FOLATE: 12.8 ng/mL

## 2014-01-13 LAB — FERRITIN: Ferritin: 244 ng/mL (ref 10–291)

## 2014-01-13 MED ORDER — HEPARIN SOD (PORK) LOCK FLUSH 100 UNIT/ML IV SOLN
500.0000 [IU] | Freq: Every day | INTRAVENOUS | Status: AC | PRN
  Administered 2014-01-13: 500 [IU]
  Filled 2014-01-13: qty 5

## 2014-01-13 MED ORDER — DIPHENHYDRAMINE HCL 25 MG PO CAPS
ORAL_CAPSULE | ORAL | Status: AC
  Filled 2014-01-13: qty 2

## 2014-01-13 MED ORDER — ACETAMINOPHEN 325 MG PO TABS
650.0000 mg | ORAL_TABLET | Freq: Once | ORAL | Status: AC
  Administered 2014-01-13: 650 mg via ORAL

## 2014-01-13 MED ORDER — SODIUM CHLORIDE 0.9 % IV SOLN
250.0000 mL | Freq: Once | INTRAVENOUS | Status: AC
  Administered 2014-01-13: 250 mL via INTRAVENOUS

## 2014-01-13 MED ORDER — DIPHENHYDRAMINE HCL 25 MG PO CAPS
25.0000 mg | ORAL_CAPSULE | Freq: Once | ORAL | Status: AC
  Administered 2014-01-13: 25 mg via ORAL

## 2014-01-13 MED ORDER — SODIUM CHLORIDE 0.9 % IJ SOLN
10.0000 mL | INTRAMUSCULAR | Status: AC | PRN
  Administered 2014-01-13: 10 mL

## 2014-01-13 MED ORDER — ACETAMINOPHEN 325 MG PO TABS
ORAL_TABLET | ORAL | Status: AC
  Filled 2014-01-13: qty 2

## 2014-01-13 NOTE — Progress Notes (Signed)
Tolerated transfusion without s/s adverse reaction. 

## 2014-01-14 LAB — TYPE AND SCREEN
ABO/RH(D): O POS
Antibody Screen: NEGATIVE
UNIT DIVISION: 0
UNIT DIVISION: 0

## 2014-01-16 ENCOUNTER — Ambulatory Visit (HOSPITAL_COMMUNITY)
Admission: RE | Admit: 2014-01-16 | Discharge: 2014-01-16 | Disposition: A | Discharge status: Home / Self Care | Payer: BC Managed Care – PPO | Source: Ambulatory Visit | Attending: Oncology | Admitting: Oncology

## 2014-01-16 DIAGNOSIS — M7989 Other specified soft tissue disorders: Secondary | ICD-10-CM | POA: Insufficient documentation

## 2014-01-16 DIAGNOSIS — M25571 Pain in right ankle and joints of right foot: Secondary | ICD-10-CM

## 2014-01-16 DIAGNOSIS — M25579 Pain in unspecified ankle and joints of unspecified foot: Secondary | ICD-10-CM | POA: Insufficient documentation
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
## 2014-02-03 ENCOUNTER — Encounter (HOSPITAL_COMMUNITY): Payer: Self-pay | Admitting: Oncology

## 2014-02-03 ENCOUNTER — Encounter (HOSPITAL_BASED_OUTPATIENT_CLINIC_OR_DEPARTMENT_OTHER): Payer: BC Managed Care – PPO | Admitting: Oncology

## 2014-02-03 ENCOUNTER — Encounter (HOSPITAL_COMMUNITY): Payer: BC Managed Care – PPO | Attending: Hematology and Oncology

## 2014-02-03 ENCOUNTER — Other Ambulatory Visit (HOSPITAL_COMMUNITY): Payer: Self-pay | Admitting: Oncology

## 2014-02-03 VITALS — BP 129/60 | HR 79 | Temp 98.6°F | Resp 18

## 2014-02-03 VITALS — BP 108/75 | HR 83 | Temp 98.3°F | Resp 20 | Wt 151.2 lb

## 2014-02-03 DIAGNOSIS — R21 Rash and other nonspecific skin eruption: Secondary | ICD-10-CM | POA: Insufficient documentation

## 2014-02-03 DIAGNOSIS — T451X5A Adverse effect of antineoplastic and immunosuppressive drugs, initial encounter: Secondary | ICD-10-CM

## 2014-02-03 DIAGNOSIS — C50919 Malignant neoplasm of unspecified site of unspecified female breast: Secondary | ICD-10-CM

## 2014-02-03 DIAGNOSIS — D6481 Anemia due to antineoplastic chemotherapy: Secondary | ICD-10-CM

## 2014-02-03 DIAGNOSIS — L259 Unspecified contact dermatitis, unspecified cause: Secondary | ICD-10-CM

## 2014-02-03 DIAGNOSIS — R918 Other nonspecific abnormal finding of lung field: Secondary | ICD-10-CM

## 2014-02-03 DIAGNOSIS — C773 Secondary and unspecified malignant neoplasm of axilla and upper limb lymph nodes: Secondary | ICD-10-CM

## 2014-02-03 DIAGNOSIS — E876 Hypokalemia: Secondary | ICD-10-CM

## 2014-02-03 DIAGNOSIS — Z5112 Encounter for antineoplastic immunotherapy: Secondary | ICD-10-CM

## 2014-02-03 LAB — CBC WITH DIFFERENTIAL/PLATELET
BASOS ABS: 0 10*3/uL (ref 0.0–0.1)
BASOS PCT: 1 % (ref 0–1)
EOS ABS: 0.8 10*3/uL — AB (ref 0.0–0.7)
EOS PCT: 14 % — AB (ref 0–5)
HCT: 33 % — ABNORMAL LOW (ref 36.0–46.0)
HEMOGLOBIN: 11.1 g/dL — AB (ref 12.0–15.0)
Lymphocytes Relative: 25 % (ref 12–46)
Lymphs Abs: 1.5 10*3/uL (ref 0.7–4.0)
MCH: 34.3 pg — ABNORMAL HIGH (ref 26.0–34.0)
MCHC: 33.6 g/dL (ref 30.0–36.0)
MCV: 101.9 fL — AB (ref 78.0–100.0)
MONO ABS: 0.5 10*3/uL (ref 0.1–1.0)
Monocytes Relative: 8 % (ref 3–12)
Neutro Abs: 3.1 10*3/uL (ref 1.7–7.7)
Neutrophils Relative %: 52 % (ref 43–77)
Platelets: 194 10*3/uL (ref 150–400)
RBC: 3.24 MIL/uL — ABNORMAL LOW (ref 3.87–5.11)
RDW: 17.9 % — AB (ref 11.5–15.5)
WBC: 5.9 10*3/uL (ref 4.0–10.5)

## 2014-02-03 LAB — COMPREHENSIVE METABOLIC PANEL
ALBUMIN: 3.9 g/dL (ref 3.5–5.2)
ALT: 15 U/L (ref 0–35)
AST: 16 U/L (ref 0–37)
Alkaline Phosphatase: 108 U/L (ref 39–117)
BILIRUBIN TOTAL: 0.3 mg/dL (ref 0.3–1.2)
BUN: 15 mg/dL (ref 6–23)
CALCIUM: 9.4 mg/dL (ref 8.4–10.5)
CHLORIDE: 102 meq/L (ref 96–112)
CO2: 30 mEq/L (ref 19–32)
CREATININE: 0.74 mg/dL (ref 0.50–1.10)
GFR calc Af Amer: >90 mL/min (ref >90)
GFR calc non Af Amer: >90 mL/min (ref >90)
Glucose, Bld: 116 mg/dL — ABNORMAL HIGH (ref 70–99)
Potassium: 3.3 mEq/L — ABNORMAL LOW (ref 3.7–5.3)
Sodium: 144 mEq/L (ref 137–147)
TOTAL PROTEIN: 6.8 g/dL (ref 6.0–8.3)

## 2014-02-03 MED ORDER — ACETAMINOPHEN 325 MG PO TABS
650.0000 mg | ORAL_TABLET | Freq: Once | ORAL | Status: AC
Start: 2014-02-03 — End: 2014-02-03
  Administered 2014-02-03: 650 mg via ORAL

## 2014-02-03 MED ORDER — DIPHENHYDRAMINE HCL 25 MG PO CAPS
50.0000 mg | ORAL_CAPSULE | Freq: Once | ORAL | Status: AC
  Administered 2014-02-03: 50 mg via ORAL

## 2014-02-03 MED ORDER — HEPARIN SOD (PORK) LOCK FLUSH 100 UNIT/ML IV SOLN
500.0000 [IU] | Freq: Once | INTRAVENOUS | Status: AC | PRN
  Administered 2014-02-03: 500 [IU]
  Filled 2014-02-03: qty 5

## 2014-02-03 MED ORDER — POTASSIUM CHLORIDE CRYS ER 20 MEQ PO TBCR: 20.0000 meq | EXTENDED_RELEASE_TABLET | Freq: Two times a day (BID) | ORAL | Status: DC

## 2014-02-03 MED ORDER — HEPARIN SOD (PORK) LOCK FLUSH 100 UNIT/ML IV SOLN
INTRAVENOUS | Status: AC
  Filled 2014-02-03: qty 5

## 2014-02-03 MED ORDER — ACETAMINOPHEN 325 MG PO TABS
ORAL_TABLET | ORAL | Status: AC
  Filled 2014-02-03: qty 2

## 2014-02-03 MED ORDER — SODIUM CHLORIDE 0.9 % IV SOLN
Freq: Once | INTRAVENOUS | Status: AC
  Administered 2014-02-03: 12:00:00 via INTRAVENOUS

## 2014-02-03 MED ORDER — HYDROCORTISONE 1 % EX CREA
TOPICAL_CREAM | Freq: Once | CUTANEOUS | Status: DC
  Filled 2014-02-03: qty 28

## 2014-02-03 MED ORDER — SODIUM CHLORIDE 0.9 % IV SOLN
6.0000 mg/kg | Freq: Once | INTRAVENOUS | Status: AC
  Administered 2014-02-03: 420 mg via INTRAVENOUS
  Filled 2014-02-03: qty 20

## 2014-02-03 MED ORDER — SODIUM CHLORIDE 0.9 % IJ SOLN
10.0000 mL | INTRAMUSCULAR | Status: DC | PRN
  Administered 2014-02-03: 10 mL

## 2014-02-03 MED ORDER — DIPHENHYDRAMINE HCL 25 MG PO CAPS
ORAL_CAPSULE | ORAL | Status: AC
  Filled 2014-02-03: qty 2

## 2014-02-03 NOTE — Progress Notes (Signed)
Tolerated well

## 2014-02-03 NOTE — Patient Instructions (Addendum)
[facility] Discharge Instructions  RECOMMENDATIONS MADE BY THE CONSULTANT AND ANY TEST RESULTS WILL BE SENT TO YOUR REFERRING PHYSICIAN.   MEDICATIONS PRESCRIBED:  Hydrocortisone 1% cream, to be applied now in the chemotherapy area and twice daily at home as needed for itching. Recommend phenolated calamine lotion to be applied as directed Recommend coverage of area to reduce desire to scratch. You can take Benadryl by mouth for itching too, keeping in mind it can make you drowsy.  At bed time may be the best time to take Benadryl to help with itching and to help with sleep. This is unlikely chemotherapy-induced  INSTRUCTIONS GIVEN AND DISCUSSED: Follow-up with podiatrist as directed.  Foot/ankle pain is not from chemotherapy.  SPECIAL INSTRUCTIONS/FOLLOW-UP: MUGA scan to evaluate heart function on 6/17 as scheduled Herceptin infusion in 3 weeks Return for follow-up appointment in 3 weeks Radiation therapy daily as directed.  Thank you for choosing [facility] to provide your oncology and hematology care.  To afford each patient quality time with our providers, please arrive at least 15 minutes before your scheduled appointment time.  With your help, our goal is to use those 15 minutes to complete the necessary work-up to ensure our physicians have the information they need to help with your evaluation and healthcare recommendations.    Effective January 1st, 2014, we ask that you re-schedule your appointment with our physicians should you arrive 10 or more minutes late for your appointment.  We strive to give you quality time with our providers, and arriving late affects you and other patients whose appointments are after yours.    Again, thank you for choosing [facility].  Our hope is that these requests will decrease the amount of time that you wait before being seen by our physicians.        _____________________________________________________________  Should you have questions after your visit to [facility], please contact our office at [phone] between the hours of 8:30 a.m. and 5:00 p.m.  Voicemails left after 4:30 p.m. will not be returned until the following business day.  For prescription refill requests, have your pharmacy contact our office with your prescription refill request.

## 2014-02-10 ENCOUNTER — Encounter (HOSPITAL_COMMUNITY): Payer: BC Managed Care – PPO

## 2014-02-12 ENCOUNTER — Encounter (HOSPITAL_COMMUNITY)
Admission: RE | Admit: 2014-02-12 | Discharge: 2014-02-12 | Disposition: A | Discharge status: Home / Self Care | Payer: BC Managed Care – PPO | Source: Ambulatory Visit | Attending: Oncology | Admitting: Oncology

## 2014-02-12 ENCOUNTER — Encounter (HOSPITAL_COMMUNITY): Payer: Self-pay

## 2014-02-12 DIAGNOSIS — Z09 Encounter for follow-up examination after completed treatment for conditions other than malignant neoplasm: Secondary | ICD-10-CM | POA: Insufficient documentation

## 2014-02-12 DIAGNOSIS — Z9221 Personal history of antineoplastic chemotherapy: Secondary | ICD-10-CM | POA: Insufficient documentation

## 2014-02-12 DIAGNOSIS — C773 Secondary and unspecified malignant neoplasm of axilla and upper limb lymph nodes: Secondary | ICD-10-CM

## 2014-02-12 DIAGNOSIS — C50919 Malignant neoplasm of unspecified site of unspecified female breast: Secondary | ICD-10-CM | POA: Insufficient documentation

## 2014-02-12 MED ORDER — TECHNETIUM TC 99M-LABELED RED BLOOD CELLS IV KIT
25.0000 | PACK | Freq: Once | INTRAVENOUS | Status: AC | PRN
  Administered 2014-02-12: 25 via INTRAVENOUS

## 2014-02-12 MED ORDER — HEPARIN SOD (PORK) LOCK FLUSH 100 UNIT/ML IV SOLN
INTRAVENOUS | Status: AC
  Filled 2014-02-12: qty 5

## 2014-02-14 ENCOUNTER — Encounter (HOSPITAL_COMMUNITY): Payer: Self-pay

## 2014-02-23 NOTE — Progress Notes (Signed)
[name], [place] [id number]  Breast cancer metastasized to axillary lymph node, left - Plan: [facility]  CURRENT THERAPY: Completing 52 weeks worth of Herceptin therapy. Undergoing radiation therapy. S/P cycle 6 of TCH + Neulasta support.   INTERVAL HISTORY: KEOSHA ROSSA 58 y.o. female returns for  regular  visit for followup of Invasive ductal carcinoma of left breast with high grade DCIS. S/P left partial mastectomy on 07/03/2013 demonstrating a 1.4 cm invasive component that was ER 74%, PR 16%, Ki-67 52%, and HER2 negative. However, positive margins were noted and therefore on 07/17/2013 she underwent a left modified radical mastectomy and left axillary lymph node dissection. Clear margins were ascertained with 6/04 positive lymph nodes positive for metastatic disease. Testing on the positive lymph node reveals HER2 positivity. OncoType DX score was 29 placing her in the high-end of the intermediate risk group for recurrence. She was therefore started on [facility] therapy on 08/27/2013 and finished TC on 12/18/2013 after 6 cycles. She is now completing 52 weeks worth of Herceptin therapy and now undergoing radiation therapy. To start Aromasin in near future    Invasive ductal carcinoma metastasized to axillary lymph node, left   05/28/2013 Imaging Screening mammogram   06/19/2013 Imaging Left diagnostic mammogram   06/25/2013 Imaging CT CAP- borderline enlarged axillary lymph node on left.  Small pulmonary nodules noted but too small for PET or biopsy; surveillance recommended.    07/03/2013 Surgery Left partial mastectomy.  Invasive ductal carcinoma. 1.4 cm.  POSITIVE MARGINS.  Additional extensive DCIS noted.  ER 74%, PR 16%, Ki-67 52%, HER2 NEGATIVE.   07/17/2013 Surgery Left modified radiacl mastectomy with left axillary node dissection.  Negative residual cancer.  Negative inked margins.  1/12 positive lymph nodes.  FINAL MARGINS NEGATIVE.   LYMPH NODE IS HER2 POSITIVE.   08/13/2013 Imaging MUGA scan shows left ventricular EF of 62%   08/27/2013 - 12/18/2013 Chemotherapy TCH x 6 cycles   01/07/2014 -  Chemotherapy Herceptin x 52 weeks   01/27/2014 -  Radiation Therapy Anticipated to be completed on 7/17    Chemotherapy Aromasin to start on 03/29/2014    I personally reviewed and went over radiographic studies with the patient.  The results are noted within this dictation.   MUGA scan on 6/19 showed a stable EF.  She is tolerating radiation therapy well.  She will undergo a boost in the future.  She is anticipated to complete radiation on 7/17.  Oncologically, she denies any complaints and ROS questioning is negative.   She reports that she had a nice time at the [place] celebration.  She looks forward to next years celebration.  She requests some education regarding her treatment and how that compares to other treatments for breast cancer.  She has been talking with other survivor's that she runs across in the radiation suite and their treatments differ.  She is given information regarding this.    Past Medical History  Diagnosis Date  . GERD (gastroesophageal reflux disease)   . Hypertension   . Bronchitis 07/27/2011  . Barrett's esophagus   . Anxiety   . Breast cancer, left breast     has GERD (gastroesophageal reflux disease); Hypertension; Barrett esophagus; and Invasive ductal carcinoma metastasized to axillary lymph node, left on her problem list.     has No Known Allergies.  Ms. Brandi does not currently have medications on file.  Past Surgical History  Procedure Laterality Date  . Excision morton's neuroma  07/14/2011    Procedure: EXCISION MORTON'S NEUROMA;  Surgeon: Marcheta Grammes;  Location: [facility];  Service: Orthopedics;  Laterality: Right;  Removal Neuroma 3rd Interspace Right Foot  . Esophagogastroduodenoscopy  08/03/2011    Procedure:  ESOPHAGOGASTRODUODENOSCOPY (EGD);  Surgeon: Rogene Houston, MD;  Location: [facility];  Service: Endoscopy;  Laterality: N/A;  12:45  . Partial mastectomy with needle localization Left 07/03/2013    Procedure: PARTIAL MASTECTOMY STATUS POST NEEDLE LOCALIZATION;  Surgeon: Scherry Ran, MD;  Location: [facility];  Service: General;  Laterality: Left;  . Breast biopsy Bilateral     X 4- benign  . Mastectomy modified radical Left 07/17/2013    Procedure: MASTECTOMY MODIFIED RADICAL WITH LEFT AXILLARY TISSUE REMOVAL;  Surgeon: Scherry Ran, MD;  Location: [facility];  Service: General;  Laterality: Left;  . Portacath placement Right 08/16/2013    Procedure: INSERTION PORT-A-CATH RIGHT SUBCLAVIAN;  Surgeon: Scherry Ran, MD;  Location: [facility];  Service: General;  Laterality: Right;    Denies any headaches, dizziness, double vision, fevers, chills, night sweats, nausea, vomiting, diarrhea, constipation, chest pain, heart palpitations, shortness of breath, blood in stool, black tarry stool, urinary pain, urinary burning, urinary frequency, hematuria.   PHYSICAL EXAMINATION  ECOG PERFORMANCE STATUS: 0 - Asymptomatic  Filed Vitals:   02/26/14 1000  BP: 126/63  Pulse: 73  Temp: 98.7 F (37.1 C)  Resp: 18    GENERAL:alert, no distress, well nourished, well developed, comfortable, cooperative and smiling SKIN: skin color, texture, turgor are normal, no rashes or significant lesions HEAD: Normocephalic, No masses, lesions, tenderness or abnormalities EYES: normal, PERRLA, EOMI, Conjunctiva are pink and non-injected EARS: External ears normal OROPHARYNX:lips, buccal mucosa, and tongue normal and mucous membranes are moist  NECK: supple, trachea midline LYMPH:  not examined BREAST:not examined LUNGS: not examined HEART: not examined ABDOMEN:not examined BACK: Back symmetric, no curvature. EXTREMITIES:less then 2 second capillary refill, no skin discoloration, no clubbing, no  cyanosis.  Right posterior arm scar from dermatitis NEURO: alert & oriented x 3 with fluent speech, no focal motor/sensory deficits, gait normal    LABORATORY DATA: CBC    Component Value Date/Time   WBC 5.9 02/03/2014 1213   RBC 3.24* 02/03/2014 1213   RBC 2.41* 01/13/2014 1000   HGB 11.1* 02/03/2014 1213   HCT 33.0* 02/03/2014 1213   PLT 194 02/03/2014 1213   MCV 101.9* 02/03/2014 1213   MCH 34.3* 02/03/2014 1213   MCHC 33.6 02/03/2014 1213   RDW 17.9* 02/03/2014 1213   LYMPHSABS 1.5 02/03/2014 1213   MONOABS 0.5 02/03/2014 1213   EOSABS 0.8* 02/03/2014 1213   BASOSABS 0.0 02/03/2014 1213      Chemistry      Component Value Date/Time   NA 144 02/03/2014 1213   K 3.3* 02/03/2014 1213   CL 102 02/03/2014 1213   CO2 30 02/03/2014 1213   BUN 15 02/03/2014 1213   CREATININE 0.74 02/03/2014 1213      Component Value Date/Time   CALCIUM 9.4 02/03/2014 1213   ALKPHOS 108 02/03/2014 1213   AST 16 02/03/2014 1213   ALT 15 02/03/2014 1213   BILITOT 0.3 02/03/2014 1213       RADIOGRAPHIC STUDIES:  02/14/2014  CLINICAL DATA: Cardiotoxic chemotherapy. Breast cancer.  EXAM:  NUCLEAR MEDICINE CARDIAC BLOOD POOL IMAGING (MUGA)  TECHNIQUE:  Cardiac multi-gated acquisition was performed at rest following  intravenous injection of Tc-74mlabeled red blood  cells.  RADIOPHARMACEUTICALS: 25 mCi MCiTc-1min-vitro labeled red blood  cells.  COMPARISON: MUGA 11/11/13  FINDINGS:  No focal wall motion abnormality.  Left ventricular ejection fraction equals 67 % compared to 66% on  11/11/2013.  IMPRESSION:  Left ventricular ejection fraction equals 67%. Not changed from  prior.  Electronically Signed  By: SSuzy BouchardM.D.  On: 02/12/2014 15:59     ASSESSMENT:  1. 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 S/P left partial mastectomy on 07/03/2013 demonstrating a 1.4 cm invasive component that was ER 74%, PR 16%, Ki-67 52%, and HER2 negative. However, positive margins were noted and therefore on  07/17/2013 she underwent a left modified radical mastectomy and left axillary lymph node dissection. Clear margins were ascertained with 11/28positive lymph nodes positive for metastatic disease. Testing on the positive lymph node reveals HER2 positivity. OncoType DX score was 29 placing her in the high-end of the intermediate risk group for recurrence. She is S/P TCH therapy x 6 cycles and is now on Herceptin x 52 weeks. She is undergoing radiation therapy and aromatase inhibitor in the near future.  2. Tobacco abuse, 20 pack year smoking history  3. Pulmonary nodules, likely benign. Future surveillance is recommended per radiologist reading report.  4. Peripheral neuropathy well-controlled with gabapentin 300 mg twice a day.  5. Chemotherapy-induced anemia, S/P 2 units of PRBCs on 11/25/2013  6. Localized contact dermatitis on right lower arm, posteriorly with resolution today and scarring noted.  Patient Active Problem List   Diagnosis Date Noted  . Invasive ductal carcinoma metastasized to axillary lymph node, left 08/09/2013  . Barrett esophagus 12/22/2011  . GERD (gastroesophageal reflux disease) 06/23/2011  . Hypertension 06/23/2011     PLAN:  1. I personally reviewed and went over laboratory results with the patient.  The results are noted within this dictation. 2. I personally reviewed and went over radiographic studies with the patient.  The results are noted within this dictation.   3. Herceptin today as planned 4. MUGA scan in 12 weeks. 5. Recommend Vit E oil for right arm scar. 6. Start AI therapy on 03/29/2014. 7. Labs today: CBC diff, CMET 8. Education regarding different treatment modalities of breast cancer 9. Return in 6 weeks for follow-up   THERAPY PLAN:  She is actively undergoing radiation therapy. Once completed, she will start AI therapy. She will also complete 52 weeks worth of Herceptin.   All questions were answered. The patient knows to call the clinic with  any problems, questions or concerns. We can certainly see the patient much sooner if necessary.  Patient and plan discussed with Dr. GFarrel Gobbleand he is in agreement with the aforementioned.   Daimon Kean 02/26/2014

## 2014-02-26 ENCOUNTER — Other Ambulatory Visit (HOSPITAL_COMMUNITY): Payer: Self-pay | Admitting: Oncology

## 2014-02-26 ENCOUNTER — Encounter (HOSPITAL_BASED_OUTPATIENT_CLINIC_OR_DEPARTMENT_OTHER): Payer: BC Managed Care – PPO | Admitting: Oncology

## 2014-02-26 ENCOUNTER — Encounter (HOSPITAL_COMMUNITY): Payer: Self-pay | Admitting: Oncology

## 2014-02-26 ENCOUNTER — Encounter (HOSPITAL_COMMUNITY): Payer: BC Managed Care – PPO | Attending: Hematology and Oncology

## 2014-02-26 VITALS — BP 123/72 | HR 72 | Temp 98.0°F | Resp 18

## 2014-02-26 VITALS — BP 126/63 | HR 73 | Temp 98.7°F | Resp 18 | Wt 151.1 lb

## 2014-02-26 DIAGNOSIS — E876 Hypokalemia: Secondary | ICD-10-CM

## 2014-02-26 DIAGNOSIS — C773 Secondary and unspecified malignant neoplasm of axilla and upper limb lymph nodes: Secondary | ICD-10-CM | POA: Insufficient documentation

## 2014-02-26 DIAGNOSIS — G609 Hereditary and idiopathic neuropathy, unspecified: Secondary | ICD-10-CM

## 2014-02-26 DIAGNOSIS — C50919 Malignant neoplasm of unspecified site of unspecified female breast: Secondary | ICD-10-CM

## 2014-02-26 DIAGNOSIS — R918 Other nonspecific abnormal finding of lung field: Secondary | ICD-10-CM

## 2014-02-26 DIAGNOSIS — C50912 Malignant neoplasm of unspecified site of left female breast: Secondary | ICD-10-CM

## 2014-02-26 DIAGNOSIS — T451X5A Adverse effect of antineoplastic and immunosuppressive drugs, initial encounter: Secondary | ICD-10-CM

## 2014-02-26 DIAGNOSIS — D6481 Anemia due to antineoplastic chemotherapy: Secondary | ICD-10-CM

## 2014-02-26 DIAGNOSIS — Z5112 Encounter for antineoplastic immunotherapy: Secondary | ICD-10-CM

## 2014-02-26 LAB — COMPREHENSIVE METABOLIC PANEL
ALBUMIN: 3.9 g/dL (ref 3.5–5.2)
ALK PHOS: 86 U/L (ref 39–117)
ALT: 25 U/L (ref 0–35)
ANION GAP: 10 (ref 5–15)
AST: 22 U/L (ref 0–37)
BUN: 12 mg/dL (ref 6–23)
CO2: 29 mEq/L (ref 19–32)
CREATININE: 0.65 mg/dL (ref 0.50–1.10)
Calcium: 9.5 mg/dL (ref 8.4–10.5)
Chloride: 104 mEq/L (ref 96–112)
GFR calc non Af Amer: >90 mL/min (ref >90)
GLUCOSE: 117 mg/dL — AB (ref 70–99)
Potassium: 3.4 mEq/L — ABNORMAL LOW (ref 3.7–5.3)
Sodium: 143 mEq/L (ref 137–147)
TOTAL PROTEIN: 6.9 g/dL (ref 6.0–8.3)
Total Bilirubin: 0.3 mg/dL (ref 0.3–1.2)

## 2014-02-26 LAB — CBC WITH DIFFERENTIAL/PLATELET
BASOS PCT: 1 % (ref 0–1)
Basophils Absolute: 0 10*3/uL (ref 0.0–0.1)
EOS ABS: 0.6 10*3/uL (ref 0.0–0.7)
EOS PCT: 12 % — AB (ref 0–5)
HCT: 33.5 % — ABNORMAL LOW (ref 36.0–46.0)
HEMOGLOBIN: 11.2 g/dL — AB (ref 12.0–15.0)
LYMPHS ABS: 1.2 10*3/uL (ref 0.7–4.0)
Lymphocytes Relative: 23 % (ref 12–46)
MCH: 34.5 pg — AB (ref 26.0–34.0)
MCHC: 33.4 g/dL (ref 30.0–36.0)
MCV: 103.1 fL — AB (ref 78.0–100.0)
MONO ABS: 0.3 10*3/uL (ref 0.1–1.0)
MONOS PCT: 6 % (ref 3–12)
Neutro Abs: 2.9 10*3/uL (ref 1.7–7.7)
Neutrophils Relative %: 58 % (ref 43–77)
Platelets: 155 10*3/uL (ref 150–400)
RBC: 3.25 MIL/uL — AB (ref 3.87–5.11)
RDW: 15.8 % — ABNORMAL HIGH (ref 11.5–15.5)
WBC: 5.1 10*3/uL (ref 4.0–10.5)

## 2014-02-26 MED ORDER — POTASSIUM CHLORIDE CRYS ER 20 MEQ PO TBCR: 20.0000 meq | EXTENDED_RELEASE_TABLET | Freq: Two times a day (BID) | ORAL | Status: DC

## 2014-02-26 MED ORDER — ACETAMINOPHEN 325 MG PO TABS
650.0000 mg | ORAL_TABLET | Freq: Once | ORAL | Status: AC
  Administered 2014-02-26: 650 mg via ORAL
  Filled 2014-02-26: qty 2

## 2014-02-26 MED ORDER — TRASTUZUMAB CHEMO INJECTION 440 MG
6.0000 mg/kg | Freq: Once | INTRAVENOUS | Status: AC
  Administered 2014-02-26: 420 mg via INTRAVENOUS
  Filled 2014-02-26: qty 20

## 2014-02-26 MED ORDER — DIPHENHYDRAMINE HCL 25 MG PO CAPS
50.0000 mg | ORAL_CAPSULE | Freq: Once | ORAL | Status: AC
  Administered 2014-02-26: 50 mg via ORAL
  Filled 2014-02-26: qty 2

## 2014-02-26 MED ORDER — SODIUM CHLORIDE 0.9 % IV SOLN
Freq: Once | INTRAVENOUS | Status: AC
  Administered 2014-02-26: 11:00:00 via INTRAVENOUS

## 2014-02-26 MED ORDER — SODIUM CHLORIDE 0.9 % IJ SOLN: 10.0000 mL | INTRAMUSCULAR | Status: DC | PRN

## 2014-02-26 MED ORDER — HEPARIN SOD (PORK) LOCK FLUSH 100 UNIT/ML IV SOLN
500.0000 [IU] | Freq: Once | INTRAVENOUS | Status: AC | PRN
  Administered 2014-02-26: 500 [IU]
  Filled 2014-02-26: qty 5

## 2014-02-26 NOTE — Progress Notes (Signed)
Patient tolerated chemotherapy infusion well. Walked out unaided.

## 2014-02-26 NOTE — Patient Instructions (Signed)
[facility] Discharge Instructions  RECOMMENDATIONS MADE BY THE CONSULTANT AND ANY TEST RESULTS WILL BE SENT TO YOUR REFERRING PHYSICIAN.  EXAM FINDINGS BY THE PHYSICIAN TODAY AND SIGNS OR SYMPTOMS TO REPORT TO CLINIC OR PRIMARY PHYSICIAN: Exam and findings as discussed by Robynn Pane, PA-C.  No changes in therapy.  Start the Aromasin on 03/29/14. Report any new lumps, bone pain, shortness of breath or other symptoms.    INSTRUCTIONS/FOLLOW-UP: Chemo every 3 weeks and follow-up visit in 6 weeks.  Will do MUGA in 12 weeks.  Thank you for choosing [facility] to provide your oncology and hematology care.  To afford each patient quality time with our providers, please arrive at least 15 minutes before your scheduled appointment time.  With your help, our goal is to use those 15 minutes to complete the necessary work-up to ensure our physicians have the information they need to help with your evaluation and healthcare recommendations.    Effective January 1st, 2014, we ask that you re-schedule your appointment with our physicians should you arrive 10 or more minutes late for your appointment.  We strive to give you quality time with our providers, and arriving late affects you and other patients whose appointments are after yours.    Again, thank you for choosing [facility].  Our hope is that these requests will decrease the amount of time that you wait before being seen by our physicians.       _____________________________________________________________  Should you have questions after your visit to [facility], please contact our office at [phone] between the hours of 8:30 a.m. and 4:30 p.m.  Voicemails left after 4:30 p.m. will not be returned until the following business day.  For prescription refill requests, have your pharmacy contact our office with your prescription refill request.     _______________________________________________________________  We hope that we have given you very good care.  You may receive a patient satisfaction survey in the mail, please complete it and return it as soon as possible.  We value your feedback!  _______________________________________________________________  Have you asked about our STAR program?  STAR stands for Survivorship Training and Rehabilitation, and this is a nationally recognized cancer care program that focuses on survivorship and rehabilitation.  Cancer and cancer treatments may cause problems, such as, pain, making you feel tired and keeping you from doing the things that you need or want to do. Cancer rehabilitation can help. Our goal is to reduce these troubling effects and help you have the best quality of life possible.  You may receive a survey from a nurse that asks questions about your current state of health.  Based on the survey results, all eligible patients will be referred to the [facility] program for an evaluation so we can better serve you!  A frequently asked questions sheet is available upon request.

## 2014-03-21 ENCOUNTER — Other Ambulatory Visit (HOSPITAL_COMMUNITY): Payer: Self-pay | Admitting: Oncology

## 2014-03-21 ENCOUNTER — Encounter (HOSPITAL_BASED_OUTPATIENT_CLINIC_OR_DEPARTMENT_OTHER): Payer: BC Managed Care – PPO

## 2014-03-21 VITALS — BP 120/58 | HR 84 | Temp 98.3°F | Resp 20 | Wt 150.0 lb

## 2014-03-21 DIAGNOSIS — C50912 Malignant neoplasm of unspecified site of left female breast: Secondary | ICD-10-CM

## 2014-03-21 DIAGNOSIS — C773 Secondary and unspecified malignant neoplasm of axilla and upper limb lymph nodes: Secondary | ICD-10-CM

## 2014-03-21 DIAGNOSIS — E876 Hypokalemia: Secondary | ICD-10-CM

## 2014-03-21 DIAGNOSIS — C50919 Malignant neoplasm of unspecified site of unspecified female breast: Secondary | ICD-10-CM

## 2014-03-21 DIAGNOSIS — Z5112 Encounter for antineoplastic immunotherapy: Secondary | ICD-10-CM

## 2014-03-21 LAB — CBC WITH DIFFERENTIAL/PLATELET
BASOS PCT: 1 % (ref 0–1)
Basophils Absolute: 0 10*3/uL (ref 0.0–0.1)
Eosinophils Absolute: 0.2 10*3/uL (ref 0.0–0.7)
Eosinophils Relative: 6 % — ABNORMAL HIGH (ref 0–5)
HCT: 32.7 % — ABNORMAL LOW (ref 36.0–46.0)
HEMOGLOBIN: 11.2 g/dL — AB (ref 12.0–15.0)
Lymphocytes Relative: 17 % (ref 12–46)
Lymphs Abs: 0.7 10*3/uL (ref 0.7–4.0)
MCH: 35.9 pg — AB (ref 26.0–34.0)
MCHC: 34.3 g/dL (ref 30.0–36.0)
MCV: 104.8 fL — ABNORMAL HIGH (ref 78.0–100.0)
MONOS PCT: 9 % (ref 3–12)
Monocytes Absolute: 0.4 10*3/uL (ref 0.1–1.0)
NEUTROS PCT: 67 % (ref 43–77)
Neutro Abs: 2.9 10*3/uL (ref 1.7–7.7)
Platelets: 145 10*3/uL — ABNORMAL LOW (ref 150–400)
RBC: 3.12 MIL/uL — ABNORMAL LOW (ref 3.87–5.11)
RDW: 13.8 % (ref 11.5–15.5)
WBC: 4.3 10*3/uL (ref 4.0–10.5)

## 2014-03-21 LAB — COMPREHENSIVE METABOLIC PANEL
ALBUMIN: 3.8 g/dL (ref 3.5–5.2)
ALT: 17 U/L (ref 0–35)
ANION GAP: 10 (ref 5–15)
AST: 17 U/L (ref 0–37)
Alkaline Phosphatase: 87 U/L (ref 39–117)
BUN: 14 mg/dL (ref 6–23)
CO2: 27 mEq/L (ref 19–32)
CREATININE: 0.68 mg/dL (ref 0.50–1.10)
Calcium: 9.4 mg/dL (ref 8.4–10.5)
Chloride: 107 mEq/L (ref 96–112)
GFR calc Af Amer: >90 mL/min (ref >90)
GFR calc non Af Amer: >90 mL/min (ref >90)
Glucose, Bld: 125 mg/dL — ABNORMAL HIGH (ref 70–99)
POTASSIUM: 3.6 meq/L — AB (ref 3.7–5.3)
Sodium: 144 mEq/L (ref 137–147)
TOTAL PROTEIN: 6.6 g/dL (ref 6.0–8.3)
Total Bilirubin: 0.3 mg/dL (ref 0.3–1.2)

## 2014-03-21 MED ORDER — TRASTUZUMAB CHEMO INJECTION 440 MG
6.0000 mg/kg | Freq: Once | INTRAVENOUS | Status: AC
  Administered 2014-03-21: 420 mg via INTRAVENOUS
  Filled 2014-03-21: qty 20

## 2014-03-21 MED ORDER — ACETAMINOPHEN 325 MG PO TABS
650.0000 mg | ORAL_TABLET | Freq: Once | ORAL | Status: AC
  Administered 2014-03-21: 650 mg via ORAL
  Filled 2014-03-21: qty 2

## 2014-03-21 MED ORDER — SODIUM CHLORIDE 0.9 % IV SOLN
Freq: Once | INTRAVENOUS | Status: AC
  Administered 2014-03-21: 11:00:00 via INTRAVENOUS

## 2014-03-21 MED ORDER — POTASSIUM CHLORIDE CRYS ER 20 MEQ PO TBCR: 20.0000 meq | EXTENDED_RELEASE_TABLET | Freq: Two times a day (BID) | ORAL | Status: DC

## 2014-03-21 MED ORDER — DIPHENHYDRAMINE HCL 25 MG PO CAPS
50.0000 mg | ORAL_CAPSULE | Freq: Once | ORAL | Status: AC
  Administered 2014-03-21: 50 mg via ORAL
  Filled 2014-03-21: qty 2

## 2014-03-21 MED ORDER — HEPARIN SOD (PORK) LOCK FLUSH 100 UNIT/ML IV SOLN
500.0000 [IU] | Freq: Once | INTRAVENOUS | Status: AC | PRN
  Administered 2014-03-21: 500 [IU]
  Filled 2014-03-21: qty 5

## 2014-03-21 MED ORDER — SODIUM CHLORIDE 0.9 % IJ SOLN: 10.0000 mL | INTRAMUSCULAR | Status: DC | PRN

## 2014-03-21 NOTE — Patient Instructions (Signed)
..  [facility] Discharge Instructions for Patients Receiving Chemotherapy  Today you received the following chemotherapy agents Herceptin.  If you develop nausea and vomiting, or diarrhea that is not controlled by your medication, call the clinic.  The clinic phone number is [phone]. Office hours are Monday-Friday 8:30am-5:00pm.  BELOW ARE SYMPTOMS THAT SHOULD BE REPORTED IMMEDIATELY:  *FEVER GREATER THAN 101.0 F  *CHILLS WITH OR WITHOUT FEVER  NAUSEA AND VOMITING THAT IS NOT CONTROLLED WITH YOUR NAUSEA MEDICATION  *UNUSUAL SHORTNESS OF BREATH  *UNUSUAL BRUISING OR BLEEDING  TENDERNESS IN MOUTH AND THROAT WITH OR WITHOUT PRESENCE OF ULCERS  *URINARY PROBLEMS  *BOWEL PROBLEMS  UNUSUAL RASH Items with * indicate a potential emergency and should be followed up as soon as possible. If you have an emergency after office hours please contact your primary care physician or go to the nearest emergency department.  Please call the clinic during office hours if you have any questions or concerns.   You may also contact the Patient Navigator at [phone] should you have any questions or need assistance in obtaining follow up care. _____________________________________________________________________ Have you asked about our STAR program?    STAR stands for Survivorship Training and Rehabilitation, and this is a nationally recognized cancer care program that focuses on survivorship and rehabilitation.  Cancer and cancer treatments may cause problems, such as, pain, making you feel tired and keeping you from doing the things that you need or want to do. Cancer rehabilitation can help. Our goal is to reduce these troubling effects and help you have the best quality of life possible.  You may receive a survey from a nurse that asks questions about your current state of health.  Based on the survey results, all eligible patients will be referred to the [facility] program for  an evaluation so we can better serve you! A frequently asked questions sheet is available upon request.

## 2014-04-11 NOTE — Progress Notes (Signed)
[place], [place] [address]  Breast cancer metastasized to axillary lymph node, left - Plan: CBC with Differential, Comprehensive metabolic panel, [facility] [place], [facility] [place] [place]  Pulmonary nodules - Plan: CT Chest Wo Contrast, CANCELED: CT Chest Low Dose F/U Screening Wo Contrast  Peripheral neuropathy - Plan: gabapentin (NEURONTIN) 300 MG capsule  CURRENT THERAPY: Started Aromasin on 03/29/2014.  Completing 52 weeks worth of Herceptin therapy finishing in December 2015. Undergoing radiation therapy. S/P cycle 6 of TCH + Neulasta support.  INTERVAL HISTORY: Heather Vaughan 58 y.o. female returns for  regular  visit for followup of Invasive ductal carcinoma of left breast with high grade DCIS. S/P left partial mastectomy on 07/03/2013 demonstrating a 1.4 cm invasive component that was ER 74%, PR 16%, Ki-67 52%, and HER2 negative. However, positive margins were noted and therefore on 07/17/2013 she underwent a left modified radical mastectomy and left axillary lymph node dissection. Clear margins were ascertained with 7/65 positive lymph nodes positive for metastatic disease. Testing on the positive lymph node reveals HER2 positivity. OncoType DX score was 29 placing her in the high-end of the intermediate risk group for recurrence. She was therefore started on [facility] therapy on 08/27/2013 and finished TC on 12/18/2013 after 6 cycles. She is now completing 52 weeks worth of Herceptin therapy and now undergoing radiation therapy. Started Aromasin on 03/29/2014.     Invasive ductal carcinoma metastasized to axillary lymph node, left   05/28/2013 Imaging Screening mammogram   06/19/2013 Imaging Left diagnostic mammogram   06/25/2013 Imaging CT CAP- borderline enlarged axillary lymph node on left.  Small pulmonary nodules noted but too small for PET or biopsy; surveillance recommended.    07/03/2013 Surgery Left partial mastectomy.  Invasive ductal  carcinoma. 1.4 cm.  POSITIVE MARGINS.  Additional extensive DCIS noted.  ER 74%, PR 16%, Ki-67 52%, HER2 NEGATIVE.   07/17/2013 Surgery Left modified radiacl mastectomy with left axillary node dissection.  Negative residual cancer.  Negative inked margins.  1/12 positive lymph nodes.  FINAL MARGINS NEGATIVE.  LYMPH NODE IS HER2 POSITIVE.   08/13/2013 Imaging MUGA scan shows left ventricular EF of 62%   08/27/2013 - 12/18/2013 Chemotherapy TCH x 6 cycles   01/07/2014 -  Chemotherapy Herceptin x 52 weeks   01/27/2014 - 03/14/2014 Radiation Therapy By Dr. Pablo Ledger   03/29/2014 -  Chemotherapy Aromasin to start on 03/29/2014    I personally reviewed and went over laboratory results with the patient.  The results are noted within this dictation.  I personally reviewed and went over radiographic studies with the patient.  The results are noted within this dictation. MUGA on 6/19 demonstrates a Left ventricular ejection fraction equals 67%. Not changed from prior.  She will be due for her next MUGA in September 2015.  She reports symptoms indicative of peripheral neuropathy.  Review of Herceptin lists peripheral neuropathy at 1%, but I think this is more likely delayed- peripheral neuropathy after discussion with Dr. Barnet Glasgow.  She has not taken any gabapentin for this although it was prescribed to her in the past.  Therefore, I have asked her to initiate this therapy.  Additionally, she is noted to have B/L LE edema.  She is not taking her diuretics that have been prescribed.  Therefore, I have asked her to start that intervention.  Oncologically, she otherwise denies any complaints and ROS questioning is negative.   Past Medical History  Diagnosis Date  . GERD (gastroesophageal reflux  disease)   . Hypertension   . Bronchitis 07/27/2011  . Barrett's esophagus   . Anxiety   . Breast cancer, left breast     has GERD (gastroesophageal reflux disease); Hypertension; Barrett esophagus; and Invasive ductal  carcinoma metastasized to axillary lymph node, left on her problem list.     has No Known Allergies.  Heather Vaughan does not currently have medications on file.  Past Surgical History  Procedure Laterality Date  . Excision morton's neuroma  07/14/2011    Procedure: EXCISION MORTON'S NEUROMA;  Surgeon: Marcheta Grammes;  Location: [facility];  Service: Orthopedics;  Laterality: Right;  Removal Neuroma 3rd Interspace Right Foot  . Esophagogastroduodenoscopy  08/03/2011    Procedure: ESOPHAGOGASTRODUODENOSCOPY (EGD);  Surgeon: Rogene Houston, MD;  Location: [facility] END[facility] SU[facility];  Service: Endoscopy;  Laterality: N/A;  12:45  . Partial mastectomy with needle localization Left 07/03/2013    Procedure: PARTIAL MASTECTOMY STATUS POST NEEDLE LOCALIZATION;  Surgeon: Scherry Ran, MD;  Location: [facility] OR[facility];  Service: General;  Laterality: Left;  . Breast biopsy Bilateral     X 4- benign  . Mastectomy modified radical Left 07/17/2013    Procedure: MASTECTOMY MODIFIED RADICAL WITH LEFT AXILLARY TISSUE REMOVAL;  Surgeon: Scherry Ran, MD;  Location: [facility];  Service: General;  Laterality: Left;  . Portacath placement Right 08/16/2013    Procedure: INSERTION PORT-A-CATH RIGHT SUBCLAVIAN;  Surgeon: Scherry Ran, MD;  Location: [facility];  Service: General;  Laterality: Right;    Denies any headaches, dizziness, double vision, fevers, chills, night sweats, nausea, vomiting, diarrhea, constipation, chest pain, heart palpitations, shortness of breath, blood in stool, black tarry stool, urinary pain, urinary burning, urinary frequency, hematuria.   PHYSICAL EXAMINATION  ECOG PERFORMANCE STATUS: 1 - Symptomatic but completely ambulatory  Filed Vitals:   04/14/14 1000  BP: 108/56  Pulse: 71  Temp: 99.3 F (37.4 C)  Resp: 18    GENERAL:alert, healthy, no distress, well nourished, well developed, comfortable, cooperative and smiling SKIN: skin color, texture, turgor are normal, no rashes or  significant lesions HEAD: Normocephalic, No masses, lesions, tenderness or abnormalities EYES: normal, PERRLA, EOMI, Conjunctiva are pink and non-injected EARS: External ears normal OROPHARYNX:lips, buccal mucosa, and tongue normal and mucous membranes are moist  NECK: supple, no adenopathy, thyroid normal size, non-tender, without nodularity, no stridor, non-tender, trachea midline LYMPH:  no palpable lymphadenopathy BREAST:not examined LUNGS: clear to auscultation and percussion HEART: regular rate & rhythm, no murmurs, no gallops, S1 normal and S2 normal ABDOMEN:abdomen soft, non-tender and normal bowel sounds BACK: No CVA tenderness EXTREMITIES:less then 2 second capillary refill, no joint deformities, effusion, or inflammation, no skin discoloration, no clubbing, no cyanosis, positive findings:  edema B/L 3+ pitting edema  NEURO: alert & oriented x 3 with fluent speech, no focal motor/sensory deficits, gait normal   LABORATORY DATA: CBC    Component Value Date/Time   WBC 4.3 03/21/2014 1044   RBC 3.12* 03/21/2014 1044   RBC 2.41* 01/13/2014 1000   HGB 11.2* 03/21/2014 1044   HCT 32.7* 03/21/2014 1044   PLT 145* 03/21/2014 1044   MCV 104.8* 03/21/2014 1044   MCH 35.9* 03/21/2014 1044   MCHC 34.3 03/21/2014 1044   RDW 13.8 03/21/2014 1044   LYMPHSABS 0.7 03/21/2014 1044   MONOABS 0.4 03/21/2014 1044   EOSABS 0.2 03/21/2014 1044   BASOSABS 0.0 03/21/2014 1044      Chemistry      Component Value Date/Time   NA 144 03/21/2014  1044   K 3.6* 03/21/2014 1044   CL 107 03/21/2014 1044   CO2 27 03/21/2014 1044   BUN 14 03/21/2014 1044   CREATININE 0.68 03/21/2014 1044      Component Value Date/Time   CALCIUM 9.4 03/21/2014 1044   ALKPHOS 87 03/21/2014 1044   AST 17 03/21/2014 1044   ALT 17 03/21/2014 1044   BILITOT 0.3 03/21/2014 1044        ASSESSMENT:  1. 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 S/P left partial mastectomy on 07/03/2013 demonstrating a 1.4 cm invasive  component that was ER 74%, PR 16%, Ki-67 52%, and HER2 negative. However, positive margins were noted and therefore on 07/17/2013 she underwent a left modified radical mastectomy and left axillary lymph node dissection. Clear margins were ascertained with 8/76 positive lymph nodes positive for metastatic disease. Testing on the positive lymph node reveals HER2 positivity. OncoType DX score was 29 placing her in the high-end of the intermediate risk group for recurrence. She is S/P TCH therapy x 6 cycles and is now on Herceptin x 52 weeks. She is S/P radiation therapy and started Aromasin on 03/29/2014.   2. Tobacco abuse, 20 pack year smoking history  3. Pulmonary nodules, likely benign. Future surveillance is recommended per radiologist reading report.  4. Peripheral neuropathy, worsening, will start Gabapentin.  5. B/L LE edema  Patient Active Problem List   Diagnosis Date Noted  . Invasive ductal carcinoma metastasized to axillary lymph node, left 08/09/2013  . Barrett esophagus 12/22/2011  . GERD (gastroesophageal reflux disease) 06/23/2011  . Hypertension 06/23/2011     PLAN:  1. I personally reviewed and went over laboratory results with the patient.  The results are noted within this dictation. 2. I personally reviewed and went over radiographic studies with the patient.  The results are noted within this dictation.   3. MUGA as scheduled on 9/23 4. Continue Herceptin every 21 days to complete 52 weeks worth of therapy (finishing in December 2015) 5. Repeat CT of chest wo contrast in October/November 2015. 6. Gabapentin 900 mg at bedtime ordered 7. Recommend Lasix 20 mg + Aldactone 50 mg daily until LE edema is resolved 8. Continue Kdur daily 9. Continue Aromasin daily 10. Return in 9 weeks for follow-up following CT of chest.   THERAPY PLAN:  She will continue Aromasin for 5+ years beginning on 03/29/2014.  She will complete 52 weeks worth of Herceptin in December 2015.  She needs a  follow-up CT of chest to follow pulmonary nodules discovered during her breast cancer work-up in October 2014.   All questions were answered. The patient knows to call the clinic with any problems, questions or concerns. We can certainly see the patient much sooner if necessary.  Patient and plan discussed with Dr. Farrel Gobble and he is in agreement with the aforementioned.   Veda Arrellano 04/14/2014

## 2014-04-14 ENCOUNTER — Encounter (HOSPITAL_BASED_OUTPATIENT_CLINIC_OR_DEPARTMENT_OTHER): Payer: BC Managed Care – PPO | Admitting: Oncology

## 2014-04-14 ENCOUNTER — Encounter (HOSPITAL_COMMUNITY): Payer: Self-pay | Admitting: Oncology

## 2014-04-14 ENCOUNTER — Encounter (HOSPITAL_COMMUNITY): Payer: BC Managed Care – PPO | Attending: Hematology and Oncology

## 2014-04-14 VITALS — BP 125/49 | HR 84 | Temp 97.8°F | Resp 18

## 2014-04-14 VITALS — BP 108/56 | HR 71 | Temp 99.3°F | Resp 18 | Wt 150.4 lb

## 2014-04-14 DIAGNOSIS — R918 Other nonspecific abnormal finding of lung field: Secondary | ICD-10-CM

## 2014-04-14 DIAGNOSIS — G609 Hereditary and idiopathic neuropathy, unspecified: Secondary | ICD-10-CM

## 2014-04-14 DIAGNOSIS — Z5112 Encounter for antineoplastic immunotherapy: Secondary | ICD-10-CM

## 2014-04-14 DIAGNOSIS — C50919 Malignant neoplasm of unspecified site of unspecified female breast: Secondary | ICD-10-CM

## 2014-04-14 DIAGNOSIS — C773 Secondary and unspecified malignant neoplasm of axilla and upper limb lymph nodes: Secondary | ICD-10-CM

## 2014-04-14 DIAGNOSIS — C50912 Malignant neoplasm of unspecified site of left female breast: Secondary | ICD-10-CM

## 2014-04-14 DIAGNOSIS — G629 Polyneuropathy, unspecified: Secondary | ICD-10-CM

## 2014-04-14 LAB — COMPREHENSIVE METABOLIC PANEL
ALT: 15 U/L (ref 0–35)
AST: 20 U/L (ref 0–37)
Albumin: 4.2 g/dL (ref 3.5–5.2)
Alkaline Phosphatase: 88 U/L (ref 39–117)
Anion gap: 12 (ref 5–15)
BILIRUBIN TOTAL: 0.3 mg/dL (ref 0.3–1.2)
BUN: 13 mg/dL (ref 6–23)
CALCIUM: 9.5 mg/dL (ref 8.4–10.5)
CHLORIDE: 103 meq/L (ref 96–112)
CO2: 28 meq/L (ref 19–32)
CREATININE: 0.71 mg/dL (ref 0.50–1.10)
GLUCOSE: 102 mg/dL — AB (ref 70–99)
Potassium: 3.8 mEq/L (ref 3.7–5.3)
Sodium: 143 mEq/L (ref 137–147)
Total Protein: 7.1 g/dL (ref 6.0–8.3)

## 2014-04-14 LAB — CBC WITH DIFFERENTIAL/PLATELET
BASOS ABS: 0 10*3/uL (ref 0.0–0.1)
Basophils Relative: 0 % (ref 0–1)
EOS PCT: 6 % — AB (ref 0–5)
Eosinophils Absolute: 0.3 10*3/uL (ref 0.0–0.7)
HCT: 32.8 % — ABNORMAL LOW (ref 36.0–46.0)
HEMOGLOBIN: 11.4 g/dL — AB (ref 12.0–15.0)
LYMPHS ABS: 1.2 10*3/uL (ref 0.7–4.0)
LYMPHS PCT: 24 % (ref 12–46)
MCH: 36.4 pg — ABNORMAL HIGH (ref 26.0–34.0)
MCHC: 34.8 g/dL (ref 30.0–36.0)
MCV: 104.8 fL — ABNORMAL HIGH (ref 78.0–100.0)
MONOS PCT: 6 % (ref 3–12)
Monocytes Absolute: 0.3 10*3/uL (ref 0.1–1.0)
NEUTROS ABS: 3.2 10*3/uL (ref 1.7–7.7)
Neutrophils Relative %: 64 % (ref 43–77)
Platelets: 169 10*3/uL (ref 150–400)
RBC: 3.13 MIL/uL — AB (ref 3.87–5.11)
RDW: 12.8 % (ref 11.5–15.5)
WBC: 5.1 10*3/uL (ref 4.0–10.5)

## 2014-04-14 MED ORDER — TRASTUZUMAB CHEMO INJECTION 440 MG
6.0000 mg/kg | Freq: Once | INTRAVENOUS | Status: AC
  Administered 2014-04-14: 420 mg via INTRAVENOUS
  Filled 2014-04-14: qty 20

## 2014-04-14 MED ORDER — HEPARIN SOD (PORK) LOCK FLUSH 100 UNIT/ML IV SOLN
500.0000 [IU] | Freq: Once | INTRAVENOUS | Status: AC | PRN
  Administered 2014-04-14: 500 [IU]
  Filled 2014-04-14: qty 5

## 2014-04-14 MED ORDER — SODIUM CHLORIDE 0.9 % IJ SOLN
10.0000 mL | INTRAMUSCULAR | Status: DC | PRN
  Administered 2014-04-14: 10 mL

## 2014-04-14 MED ORDER — SODIUM CHLORIDE 0.9 % IV SOLN
Freq: Once | INTRAVENOUS | Status: AC
  Administered 2014-04-14: 11:00:00 via INTRAVENOUS

## 2014-04-14 MED ORDER — DIPHENHYDRAMINE HCL 25 MG PO CAPS
ORAL_CAPSULE | ORAL | Status: AC
  Filled 2014-04-14: qty 2

## 2014-04-14 MED ORDER — GABAPENTIN 300 MG PO CAPS: 900.0000 mg | ORAL_CAPSULE | Freq: Every day | ORAL | Status: DC

## 2014-04-14 MED ORDER — ACETAMINOPHEN 325 MG PO TABS
650.0000 mg | ORAL_TABLET | Freq: Once | ORAL | Status: AC
  Administered 2014-04-14: 650 mg via ORAL

## 2014-04-14 MED ORDER — DIPHENHYDRAMINE HCL 25 MG PO CAPS
50.0000 mg | ORAL_CAPSULE | Freq: Once | ORAL | Status: AC
  Administered 2014-04-14: 50 mg via ORAL

## 2014-04-14 MED ORDER — ACETAMINOPHEN 325 MG PO TABS
ORAL_TABLET | ORAL | Status: AC
  Filled 2014-04-14: qty 2

## 2014-04-14 NOTE — Patient Instructions (Addendum)
[facility] Discharge Instructions  RECOMMENDATIONS MADE BY THE CONSULTANT AND ANY TEST RESULTS WILL BE SENT TO YOUR REFERRING PHYSICIAN.  EXAM FINDINGS BY THE PHYSICIAN TODAY AND SIGNS OR SYMPTOMS TO REPORT TO CLINIC OR PRIMARY PHYSICIAN: Exam and findings as discussed by Robynn Pane, PA - C.  Star referral will be made to Rehab to see if they can help with your foot discomfort.   MEDICATIONS PRESCRIBED:  Gabapentin - take 3 at bedtime Lasix and Spironolactone - take each 1 time daily for swelling.  Once swelling decreases you can take one or the other as needed.  INSTRUCTIONS/FOLLOW-UP: Chemotherapy as scheduled CT of chest  Office visit in 9 weeks.  Thank you for choosing [facility] to provide your oncology and hematology care.  To afford each patient quality time with our providers, please arrive at least 15 minutes before your scheduled appointment time.  With your help, our goal is to use those 15 minutes to complete the necessary work-up to ensure our physicians have the information they need to help with your evaluation and healthcare recommendations.    Effective January 1st, 2014, we ask that you re-schedule your appointment with our physicians should you arrive 10 or more minutes late for your appointment.  We strive to give you quality time with our providers, and arriving late affects you and other patients whose appointments are after yours.    Again, thank you for choosing [facility].  Our hope is that these requests will decrease the amount of time that you wait before being seen by our physicians.       _____________________________________________________________  Should you have questions after your visit to [facility], please contact our office at [phone] between the hours of 8:30 a.m. and 4:30 p.m.  Voicemails left after 4:30 p.m. will not be returned until the following business day.  For  prescription refill requests, have your pharmacy contact our office with your prescription refill request.    _______________________________________________________________  We hope that we have given you very good care.  You may receive a patient satisfaction survey in the mail, please complete it and return it as soon as possible.  We value your feedback!  _______________________________________________________________  Have you asked about our STAR program?  STAR stands for Survivorship Training and Rehabilitation, and this is a nationally recognized cancer care program that focuses on survivorship and rehabilitation.  Cancer and cancer treatments may cause problems, such as, pain, making you feel tired and keeping you from doing the things that you need or want to do. Cancer rehabilitation can help. Our goal is to reduce these troubling effects and help you have the best quality of life possible.  You may receive a survey from a nurse that asks questions about your current state of health.  Based on the survey results, all eligible patients will be referred to the [facility] program for an evaluation so we can better serve you!  A frequently asked questions sheet is available upon request. Peripheral Neuropathy Peripheral neuropathy is a type of nerve damage. It affects nerves that carry signals between the spinal cord and other parts of the body. These are called peripheral nerves. With peripheral neuropathy, one nerve or a group of nerves may be damaged.  CAUSES  Many things can damage peripheral nerves. For some people with peripheral neuropathy, the cause is unknown. Some causes include:  Diabetes. This is the most common cause of peripheral neuropathy.  Injury to a nerve.  Pressure or stress on a nerve that lasts a long time.  Too little vitamin B. Alcoholism can lead to this.  Infections.  Autoimmune diseases, such as multiple sclerosis and systemic lupus erythematosus.  Inherited  nerve diseases.  Some medicines, such as cancer drugs.  Toxic substances, such as lead and mercury.  Too little blood flowing to the legs.  Kidney disease.  Thyroid disease. SIGNS AND SYMPTOMS  Different people have different symptoms. The symptoms you have will depend on which of your nerves is damaged. Common symptoms include:  Loss of feeling (numbness) in the feet and hands.  Tingling in the feet and hands.  Pain that burns.  Very sensitive skin.  Weakness.  Not being able to move a part of the body (paralysis).  Muscle twitching.  Clumsiness or poor coordination.  Loss of balance.  Not being able to control your bladder.  Feeling dizzy.  Sexual problems. DIAGNOSIS  Peripheral neuropathy is a symptom, not a disease. Finding the cause of peripheral neuropathy can be hard. To figure that out, your health care provider will take a medical history and do a physical exam. A neurological exam will also be done. This involves checking things affected by your brain, spinal cord, and nerves (nervous system). For example, your health care provider will check your reflexes, how you move, and what you can feel.  Other types of tests may also be ordered, such as:  Blood tests.  A test of the fluid in your spinal cord.  Imaging tests, such as CT scans or an MRI.  Electromyography (EMG). This test checks the nerves that control muscles.  Nerve conduction velocity tests. These tests check how fast messages pass through your nerves.  Nerve biopsy. A small piece of nerve is removed. It is then checked under a microscope. TREATMENT   Medicine is often used to treat peripheral neuropathy. Medicines may include:  Pain-relieving medicines. Prescription or over-the-counter medicine may be suggested.  Antiseizure medicine. This may be used for pain.  Antidepressants. These also may help ease pain from neuropathy.  Lidocaine. This is a numbing medicine. You might wear a  patch or be given a shot.  Mexiletine. This medicine is typically used to help control irregular heart rhythms.  Surgery. Surgery may be needed to relieve pressure on a nerve or to destroy a nerve that is causing pain.  Physical therapy to help movement.  Assistive devices to help movement. HOME CARE INSTRUCTIONS   Only take over-the-counter or prescription medicines as directed by your health care provider. Follow the instructions carefully for any given medicines. Do not take any other medicines without first getting approval from your health care provider.  If you have diabetes, work closely with your health care provider to keep your blood sugar under control.  If you have numbness in your feet:  Check every day for signs of injury or infection. Watch for redness, warmth, and swelling.  Wear padded socks and comfortable shoes. These help protect your feet.  Do not do things that put pressure on your damaged nerve.  Do not smoke. Smoking keeps blood from getting to damaged nerves.  Avoid or limit alcohol. Too much alcohol can cause a lack of B vitamins. These vitamins are needed for healthy nerves.  Develop a good support system. Coping with peripheral neuropathy can be stressful. Talk to a mental health specialist or join a support group if you are struggling.  Follow up with your health care  provider as directed. SEEK MEDICAL CARE IF:   You have new signs or symptoms of peripheral neuropathy.  You are struggling emotionally from dealing with peripheral neuropathy.  You have a fever. SEEK IMMEDIATE MEDICAL CARE IF:   You have an injury or infection that is not healing.  You feel very dizzy or begin vomiting.  You have chest pain.  You have trouble breathing. Document Released: 08/05/2002 Document Revised: 04/27/2011 Document Reviewed: 04/22/2013 [facility] Patient Information 2015 [place], [place]. This information is not intended to replace advice given to you by your  health care provider. Make sure you discuss any questions you have with your health care provider.

## 2014-04-14 NOTE — Progress Notes (Signed)
STAR Program Physical Impairment and Functional Assessment Screening Tool  1. Are you having any pain, including headaches, joint pain, or muscle pain (upper body = OT; lower body = PT)?  No  2. Do your hands and/or feet feel numb or tingle (PT)?  Yes, this started after my diagnosis and is still a problem.   3. Does any part of your body feel swollen or larger than usual (upper body = OT; lower body = PT)  Yes, this started after my diagnosis and is still a problem.  4. Are you so tired that you cannot do the things you want or need to do (PT or OT)?  No  5. Are you feeling weak or are you having trouble moving any part of your body (PT/OT)?  Yes, this started after my diagnosis and is still a problem.  6. Are you having trouble concentrating, thinking, or remembering things (OT/ST)?  No  7. Are you having trouble moving around or feel like you might trip or fall (PT)?  No  8. Are you having trouble swallowing (ST)?  No  9. Are you having trouble speaking (ST)?  No  10. Are you having trouble with going or getting to the bathroom (OT)?  No  11. Are you having trouble with your sexual function (OT)?  No  12. Are you having trouble lifting things, even just your arms (OT/PT)?  No  13. Are you having trouble taking care of yourself as in dressing or bathing (OT)?  No  14. Are you having trouble with daily tasks like chores or shopping (OT)?  No  15. Are you having trouble driving (OT)?  No  16. Are you having trouble returning to work or completing your tasks at work (OT)?  No  Other concerns:    Legend: OT = Occupational Therapy PT = Physical Therapy ST = Speech Therapy

## 2014-04-14 NOTE — Addendum Note (Signed)
Addended by: Mellissa Kohut on: 04/14/2014 12:00 PM   Modules accepted: Orders

## 2014-05-07 ENCOUNTER — Encounter (HOSPITAL_COMMUNITY): Payer: BC Managed Care – PPO | Attending: Hematology and Oncology

## 2014-05-07 ENCOUNTER — Encounter (HOSPITAL_BASED_OUTPATIENT_CLINIC_OR_DEPARTMENT_OTHER): Payer: BC Managed Care – PPO | Admitting: Oncology

## 2014-05-07 VITALS — BP 121/66 | HR 66 | Temp 98.0°F | Resp 16 | Wt 148.4 lb

## 2014-05-07 DIAGNOSIS — Z5112 Encounter for antineoplastic immunotherapy: Secondary | ICD-10-CM

## 2014-05-07 DIAGNOSIS — G620 Drug-induced polyneuropathy: Secondary | ICD-10-CM

## 2014-05-07 DIAGNOSIS — R918 Other nonspecific abnormal finding of lung field: Secondary | ICD-10-CM | POA: Insufficient documentation

## 2014-05-07 DIAGNOSIS — G622 Polyneuropathy due to other toxic agents: Secondary | ICD-10-CM

## 2014-05-07 DIAGNOSIS — C773 Secondary and unspecified malignant neoplasm of axilla and upper limb lymph nodes: Secondary | ICD-10-CM | POA: Insufficient documentation

## 2014-05-07 DIAGNOSIS — C50919 Malignant neoplasm of unspecified site of unspecified female breast: Secondary | ICD-10-CM | POA: Insufficient documentation

## 2014-05-07 DIAGNOSIS — T451X5A Adverse effect of antineoplastic and immunosuppressive drugs, initial encounter: Secondary | ICD-10-CM

## 2014-05-07 DIAGNOSIS — G609 Hereditary and idiopathic neuropathy, unspecified: Secondary | ICD-10-CM | POA: Diagnosis present

## 2014-05-07 DIAGNOSIS — C50912 Malignant neoplasm of unspecified site of left female breast: Secondary | ICD-10-CM

## 2014-05-07 LAB — CBC WITH DIFFERENTIAL/PLATELET
BASOS ABS: 0.1 10*3/uL (ref 0.0–0.1)
BASOS PCT: 1 % (ref 0–1)
Eosinophils Absolute: 0.6 10*3/uL (ref 0.0–0.7)
Eosinophils Relative: 11 % — ABNORMAL HIGH (ref 0–5)
HCT: 30.6 % — ABNORMAL LOW (ref 36.0–46.0)
Hemoglobin: 10.5 g/dL — ABNORMAL LOW (ref 12.0–15.0)
LYMPHS PCT: 25 % (ref 12–46)
Lymphs Abs: 1.3 10*3/uL (ref 0.7–4.0)
MCH: 35.4 pg — ABNORMAL HIGH (ref 26.0–34.0)
MCHC: 34.3 g/dL (ref 30.0–36.0)
MCV: 103 fL — ABNORMAL HIGH (ref 78.0–100.0)
Monocytes Absolute: 0.4 10*3/uL (ref 0.1–1.0)
Monocytes Relative: 7 % (ref 3–12)
NEUTROS PCT: 56 % (ref 43–77)
Neutro Abs: 3 10*3/uL (ref 1.7–7.7)
Platelets: 167 10*3/uL (ref 150–400)
RBC: 2.97 MIL/uL — ABNORMAL LOW (ref 3.87–5.11)
RDW: 12 % (ref 11.5–15.5)
WBC: 5.4 10*3/uL (ref 4.0–10.5)

## 2014-05-07 LAB — COMPREHENSIVE METABOLIC PANEL
ALBUMIN: 3.8 g/dL (ref 3.5–5.2)
ALK PHOS: 87 U/L (ref 39–117)
ALT: 14 U/L (ref 0–35)
AST: 16 U/L (ref 0–37)
Anion gap: 10 (ref 5–15)
BILIRUBIN TOTAL: 0.3 mg/dL (ref 0.3–1.2)
BUN: 18 mg/dL (ref 6–23)
CHLORIDE: 102 meq/L (ref 96–112)
CO2: 27 meq/L (ref 19–32)
Calcium: 9.2 mg/dL (ref 8.4–10.5)
Creatinine, Ser: 0.79 mg/dL (ref 0.50–1.10)
GFR calc Af Amer: >90 mL/min (ref >90)
GFR calc non Af Amer: 89 mL/min — ABNORMAL LOW (ref >90)
Glucose, Bld: 93 mg/dL (ref 70–99)
POTASSIUM: 3.9 meq/L (ref 3.7–5.3)
Sodium: 139 mEq/L (ref 137–147)
Total Protein: 6.6 g/dL (ref 6.0–8.3)

## 2014-05-07 MED ORDER — SODIUM CHLORIDE 0.9 % IV SOLN
6.0000 mg/kg | Freq: Once | INTRAVENOUS | Status: AC
  Administered 2014-05-07: 420 mg via INTRAVENOUS
  Filled 2014-05-07: qty 20

## 2014-05-07 MED ORDER — ACETAMINOPHEN 325 MG PO TABS
650.0000 mg | ORAL_TABLET | Freq: Once | ORAL | Status: AC
  Administered 2014-05-07: 650 mg via ORAL

## 2014-05-07 MED ORDER — DIPHENHYDRAMINE HCL 25 MG PO CAPS
ORAL_CAPSULE | ORAL | Status: AC
  Filled 2014-05-07: qty 2

## 2014-05-07 MED ORDER — SODIUM CHLORIDE 0.9 % IV SOLN
Freq: Once | INTRAVENOUS | Status: AC
  Administered 2014-05-07: 11:00:00 via INTRAVENOUS

## 2014-05-07 MED ORDER — DIPHENHYDRAMINE HCL 25 MG PO CAPS
50.0000 mg | ORAL_CAPSULE | Freq: Once | ORAL | Status: AC
  Administered 2014-05-07: 50 mg via ORAL

## 2014-05-07 MED ORDER — HEPARIN SOD (PORK) LOCK FLUSH 100 UNIT/ML IV SOLN
500.0000 [IU] | Freq: Once | INTRAVENOUS | Status: AC | PRN
  Administered 2014-05-07: 500 [IU]
  Filled 2014-05-07 (×2): qty 5

## 2014-05-07 MED ORDER — SODIUM CHLORIDE 0.9 % IJ SOLN
10.0000 mL | INTRAMUSCULAR | Status: DC | PRN
  Administered 2014-05-07: 10 mL

## 2014-05-07 MED ORDER — ACETAMINOPHEN 325 MG PO TABS
ORAL_TABLET | ORAL | Status: AC
  Filled 2014-05-07: qty 2

## 2014-05-07 NOTE — Patient Instructions (Signed)
[facility] Discharge Instructions for Patients Receiving Chemotherapy  Today you received the following chemotherapy agents Herceptin. Return to clinic as scheduled. Report any issues/concerns as needed.  If you develop nausea and vomiting that is not controlled by your nausea medication, call the clinic. If it is after clinic hours your family physician or the after hours number for the clinic or go to the Emergency Department.   BELOW ARE SYMPTOMS THAT SHOULD BE REPORTED IMMEDIATELY:  *FEVER GREATER THAN 101.0 F  *CHILLS WITH OR WITHOUT FEVER  NAUSEA AND VOMITING THAT IS NOT CONTROLLED WITH YOUR NAUSEA MEDICATION  *UNUSUAL SHORTNESS OF BREATH  *UNUSUAL BRUISING OR BLEEDING  TENDERNESS IN MOUTH AND THROAT WITH OR WITHOUT PRESENCE OF ULCERS  *URINARY PROBLEMS  *BOWEL PROBLEMS  UNUSUAL RASH Items with * indicate a potential emergency and should be followed up as soon as possible.   I have been informed and understand all the instructions given to me. I know to contact the clinic, my physician, or go to the Emergency Department if any problems should occur. I do not have any questions at this time, but understand that I may call the clinic during office hours or the Patient Navigator at [phone] should I have any questions or need assistance in obtaining follow up care.    __________________________________________  _____________  __________ Signature of Patient or Authorized Representative            Date                   Time    __________________________________________ Nurse's Signature

## 2014-05-07 NOTE — Progress Notes (Signed)
Tolerated well

## 2014-05-07 NOTE — Progress Notes (Signed)
Patient seen as a work in today.  She is here today for Herceptin and asked to see me today.  She knows her peripheral neuropathy is not much better. She explains that she may be a little bit worse than it has been. I provided her education regarding chemotherapy induced peripheral neuropathy. She explains her neuropathy improves when she's on her feet moving and that it can be painful upon ambulating after sitting for a long period time.  She is on gabapentin 900 mg.  She will call me next week. If she is not better, I'll increase from 900 mg to 1200 mg daily at bedtime  She is tolerating gabapentin well.  I provided education regarding peripheral neuropathy and the role of gabapentin and his therapy.  Dajai Wahlert 05/07/2014

## 2014-05-08 ENCOUNTER — Ambulatory Visit (HOSPITAL_COMMUNITY)
Admission: RE | Admit: 2014-05-08 | Discharge: 2014-05-08 | Disposition: A | Discharge status: Home / Self Care | Payer: BC Managed Care – PPO | Source: Ambulatory Visit | Attending: Oncology | Admitting: Oncology

## 2014-05-08 DIAGNOSIS — C50919 Malignant neoplasm of unspecified site of unspecified female breast: Secondary | ICD-10-CM | POA: Diagnosis not present

## 2014-05-08 DIAGNOSIS — IMO0001 Reserved for inherently not codable concepts without codable children: Secondary | ICD-10-CM | POA: Diagnosis not present

## 2014-05-08 DIAGNOSIS — M25673 Stiffness of unspecified ankle, not elsewhere classified: Secondary | ICD-10-CM

## 2014-05-08 DIAGNOSIS — T451X5A Adverse effect of antineoplastic and immunosuppressive drugs, initial encounter: Secondary | ICD-10-CM | POA: Diagnosis not present

## 2014-05-08 DIAGNOSIS — R2689 Other abnormalities of gait and mobility: Secondary | ICD-10-CM

## 2014-05-08 DIAGNOSIS — R262 Difficulty in walking, not elsewhere classified: Secondary | ICD-10-CM | POA: Diagnosis not present

## 2014-05-08 DIAGNOSIS — M25579 Pain in unspecified ankle and joints of unspecified foot: Secondary | ICD-10-CM | POA: Insufficient documentation

## 2014-05-08 DIAGNOSIS — G620 Drug-induced polyneuropathy: Secondary | ICD-10-CM | POA: Insufficient documentation

## 2014-05-08 DIAGNOSIS — R29898 Other symptoms and signs involving the musculoskeletal system: Secondary | ICD-10-CM

## 2014-05-08 DIAGNOSIS — M25676 Stiffness of unspecified foot, not elsewhere classified: Secondary | ICD-10-CM

## 2014-05-08 NOTE — Progress Notes (Signed)
Occupational Therapy Screen   Patient Details  Name: Heather Vaughan MRN: [id number] Date of Birth: 12/05/1955  Today's Date: 05/08/2014  Spoke with patient regarding any difficulties completing ADL tasks, fine or gross motor coordination, BUE weakness, etc. Patient voices no concerns. Patient does not require OT services at this time. No charge needed for OT screen.   Ailene Ravel, OTR/L,CBIS   05/08/2014, 1:58 PM

## 2014-05-08 NOTE — Evaluation (Addendum)
Physical Therapy Evaluation  Patient Details  Name: Heather Vaughan MRN: [id number] Date of Birth: Apr 01, 1956  Today's Date: 05/08/2014 Time: 1300-1430 PT Time Calculation (min): 20 min Charge:  evaluatoin 3382-5053; there ex 1340-1400; measure UE 9767-3419; education 1415-1430              Visit#: 1 of 8  Re-eval: 06/07/14 Assessment Diagnosis: peripheral neuropathy Prior Therapy: none  Authorization: BCBS    Past Medical History:  Past Medical History  Diagnosis Date  . GERD (gastroesophageal reflux disease)   . Hypertension   . Bronchitis 07/27/2011  . Barrett's esophagus   . Anxiety   . Breast cancer, left breast    Past Surgical History:  Past Surgical History  Procedure Laterality Date  . Excision morton's neuroma  07/14/2011    Procedure: EXCISION MORTON'S NEUROMA;  Surgeon: Marcheta Grammes;  Location: [facility];  Service: Orthopedics;  Laterality: Right;  Removal Neuroma 3rd Interspace Right Foot  . Esophagogastroduodenoscopy  08/03/2011    Procedure: ESOPHAGOGASTRODUODENOSCOPY (EGD);  Surgeon: Rogene Houston, MD;  Location: [facility];  Service: Endoscopy;  Laterality: N/A;  12:45  . Partial mastectomy with needle localization Left 07/03/2013    Procedure: PARTIAL MASTECTOMY STATUS POST NEEDLE LOCALIZATION;  Surgeon: Scherry Ran, MD;  Location: [facility];  Service: General;  Laterality: Left;  . Breast biopsy Bilateral     X 4- benign  . Mastectomy modified radical Left 07/17/2013    Procedure: MASTECTOMY MODIFIED RADICAL WITH LEFT AXILLARY TISSUE REMOVAL;  Surgeon: Scherry Ran, MD;  Location: [facility];  Service: General;  Laterality: Left;  . Portacath placement Right 08/16/2013    Procedure: INSERTION PORT-A-CATH RIGHT SUBCLAVIAN;  Surgeon: Scherry Ran, MD;  Location: [facility];  Service: General;  Laterality: Right;    Subjective Symptoms/Limitations Symptoms: Heather Vaughan states that she has pain in B LE .   She states she has the most pain  after she comes home from work.  She states that the pain is the worst getting up and starting to walk. She states that it takes about 6-7 minutes to work out.  The pain is not as severe in the mornings but there is some stiffness.  Pain is B and equal .   Pertinent History: Pt has been diagnosed with Lt breast cancer in December of 2014.  She had a Lt modified radical masectomy and has been recieving chemotherapy. How long can you sit comfortably?: no problem  How long can you stand comfortably?: after 10 minutes pt wants to hold onto something  How long can you walk comfortably?: not walking any significant distance  Pain Assessment Currently in Pain?: No/denies Pain Score:  (10/10 when she gets up after resting after work )    Dealer Screen Has the patient fallen in the past 6 months: No  Prior Functioning  Prior Function Vocation: Full time employment Vocation Requirements: 12 hour- walking,  Leisure: Hobbies-yes (Comment) Comments: grandchildren   Sensation/Coordination/Flexibility/Functional Tests Functional Tests Functional Tests: FACIT-F 144/160, VAS fatigue 7; vas pain 7,VAS distress 7; TUG 11   Assessment RLE AROM (degrees) Right Ankle Dorsiflexion: 10 Right Ankle Plantar Flexion: 55 Right Ankle Inversion: 5 Right Ankle Eversion: 18 RLE Strength Right Ankle Dorsiflexion: 3+/5 Right Ankle Plantar Flexion: 2/5 Right Ankle Inversion: 3-/5 Right Ankle Eversion: 4/5 LLE AROM (degrees) Left Ankle Dorsiflexion: 17 Left Ankle Plantar Flexion: 45 Left Ankle Inversion: 10 Left Ankle Eversion: 15 LLE Strength Left Ankle Dorsiflexion: 5/5 Left Ankle  Plantar Flexion: 2/5 Left Ankle Inversion: 3-/5 Left Ankle Eversion: 3/5  UE circumferential measurements:  Date 05/08/2014 05/08/2014   Left Right  MCP 17.70 17.50  wrist 16.2 15  4cm 17.00 17.00  8cm 20.20 20.30  12 cm 23.00 23.50  16cm 25.20 25.00  20cm 25.80 26.00  24cm 26.00 25.70  28cm 26.20  27.80  32cm 27.70 28.80  36cm 30.20 30.20  40cm    44cm    48cm    52cm    56cm    60cm    64cm        Sum of squares 6144.22 6260.40  Total Volume 1955.767 1992.7479    Exercise/Treatments Mobility/Balance  Static Standing Balance Single Leg Stance - Right Leg: 1 Single Leg Stance - Left Leg: 1  SLS: x 3 B best= 1 second.  Heel Raises: 10 reps Ankle Exercises - Seated Towel Crunch: 1 rep Heel Raises: 5 reps Toe Raise: 5 reps    Ankle Exercises - Sidelying Ankle Inversion: Both;10 reps Ankle Eversion: Both;10 reps    Physical Therapy Assessment and Plan PT Assessment and Plan Clinical Impression Statement: Heather Vaughan is a 58 yo female who has undergone 6 months of chemo and 6 months of radiatin following diagnosis of L invasive breast cancer.  She has finished with both chemo and radiation but has severe periodic pain in both of her feet as well as noting swelling in her Lt hand.  Pt was educated on lymphedema as she had not heard of it.  Measurments demonstrate mild swelling which should be able to be taken care of be a compression glove as she has no swelling noted in her arm.  (pt was given an edema glove in department with low compression since swelling was minimal to see if this would be enough compression to keep swelling down we will check with pt next treatment).  Evaluatin of pt ankles showed decreased ROM, decrased strength, decreased balance and increased pain.  Heather Vaughan will benefit from skilled PT to address these issues to improve the pt's quality of life.   Pt will benefit from skilled therapeutic intervention in order to improve on the following deficits: Decreased balance;Decreased strength;Pain;Decreased range of motion PT Frequency: Min 2X/week PT Duration: 4 weeks PT Treatment/Interventions: Gait training;Therapeutic exercise;Therapeutic activities;Manual techniques;Balance training PT Plan: begin rocker board, Baps, gastroc stretch, IN/Eversion with  towel.     Goals Home Exercise Program Pt/caregiver will Perform Home Exercise Program: For increased ROM;For increased strengthening PT Short Term Goals Time to Complete Short Term Goals: 2 weeks PT Short Term Goal 1: Pt pain to be no greater than a 7/10  PT Short Term Goal 2: Pt to be able to stand for  15 minutes with no fatigue PT Short Term Goal 3: Pt to be walking for 15 minutes for better health habits  PT Short Term Goal 4: PT to verbalize the benefit of using compression hose on her LE  PT Short Term Goal 5: Pt to be able to verbalize the knowledge of lymphedema and what she should do should she notice any increase swelling in her UE>  PT Long Term Goals Time to Complete Long Term Goals: 4 weeks PT Long Term Goal 1: Pt to be I in advance HEP  PT Long Term Goal 2: Pt to be able to SLS x 10 sec B to reduce risk of fall  Long Term Goal 3: Pt pain to be no greater than a 5/10  Long Term Goal  4: Pt to be able to stand for 20 minutes to be able to socialize   Problem List Patient Active Problem List   Diagnosis Date Noted  . Peripheral neuropathy due to chemotherapy 05/08/2014  . Unstable balance 05/08/2014  . Stiffness of joint, not elsewhere classified, ankle and foot 05/08/2014  . Ankle weakness 05/08/2014  . Invasive ductal carcinoma metastasized to axillary lymph node, left 08/09/2013  . Barrett esophagus 12/22/2011  . GERD (gastroesophageal reflux disease) 06/23/2011  . Hypertension 06/23/2011    General Behavior During Therapy: [facility] for tasks assessed/performed PT Plan of Care PT Home Exercise Plan: given   GP    RUSSELL,CINDY 05/08/2014, 4:52 PM  Physician Documentation Your signature is required to indicate approval of the treatment plan as stated above.  Please sign and either send electronically or make a copy of this report for your files and return this physician signed original.   Please mark one 1.__approve of plan  2. ___approve of plan with the following  conditions.   ______________________________                                                          _____________________ Physician Signature                                                                                                             Date

## 2014-05-13 ENCOUNTER — Ambulatory Visit (HOSPITAL_COMMUNITY)
Admission: RE | Admit: 2014-05-13 | Discharge: 2014-05-13 | Disposition: A | Discharge status: Home / Self Care | Payer: BC Managed Care – PPO | Source: Ambulatory Visit | Attending: Oncology | Admitting: Oncology

## 2014-05-13 DIAGNOSIS — IMO0001 Reserved for inherently not codable concepts without codable children: Secondary | ICD-10-CM | POA: Diagnosis not present

## 2014-05-13 NOTE — Progress Notes (Signed)
Physical Therapy Treatment Patient Details  Name: Heather Vaughan MRN: [id number] Date of Birth: June 09, 1956  Today's Date: 05/13/2014 Time: 1638-4536 PT Time Calculation (min): 46 min  Visit#: 2 of 8  Re-eval: 06/07/14 Authorization: BCBS  Charges:  therex 4680-3212 (36'), manual 1340-1350  Subjective: Symptoms/Limitations Symptoms: Pt states her bilateral feet and ankles are sore today from the new activities.   Reports complaince with HEP. Pain Assessment Currently in Pain?: No/denies   Exercise/Treatments Ankle Stretches Slant Board Stretch: 3 reps;30 seconds Ankle Exercises - Standing SLS: x 3 B best=3" Lt, 2" Rt. no UE's Heel Raises: 10 reps Toe Raise: 10 reps Other Standing Ankle Exercises: rebounder A/P, R/L 10 reps each, static stance each 1 minute no UE's Other Standing Ankle Exercises: tandem stance Lt in front of Rt 12" max of 3 no UE's Ankle Exercises - Seated Towel Inversion/Eversion: 3 reps     Manual Therapy Manual Therapy: Other (comment) Other Manual Therapy: joint mobilizations to increase ROM (focus on increasing inversion bilaterally), PROM and Myofascial techniques to lateral ankles.  Physical Therapy Assessment and Plan PT Assessment and Plan Clinical Impression Statement: Pt wearing compression glove with good fit and comfort.  Noted instability unable to SLS greater than 3 seconds without UE assist. tandem gait also difficult for patient.  PT with minimal Inversion bilatterally (Rt is worse than Lt).  Pt with improved mobility following manual techniques/joint moblizations.  Pt toes out with Rt LE with ambulation with lack of heel-toe roll.    PT Plan: Focus on improving ankle inversion, heel-toe gait and general LE stability.     Problem List Patient Active Problem List   Diagnosis Date Noted  . Peripheral neuropathy due to chemotherapy 05/08/2014  . Unstable balance 05/08/2014  . Stiffness of joint, not elsewhere classified, ankle and foot  05/08/2014  . Ankle weakness 05/08/2014  . Invasive ductal carcinoma metastasized to axillary lymph node, left 08/09/2013  . Barrett esophagus 12/22/2011  . GERD (gastroesophageal reflux disease) 06/23/2011  . Hypertension 06/23/2011    General Behavior During Therapy: [facility] for tasks assessed/performed   Teena Irani, PTA/CLT 05/13/2014, 2:10 PM

## 2014-05-16 ENCOUNTER — Ambulatory Visit (HOSPITAL_COMMUNITY)
Admission: RE | Admit: 2014-05-16 | Payer: BC Managed Care – PPO | Source: Ambulatory Visit | Admitting: Physical Therapy

## 2014-05-16 ENCOUNTER — Telehealth (HOSPITAL_COMMUNITY): Payer: Self-pay

## 2014-05-16 NOTE — Telephone Encounter (Signed)
Left a message that she went to [facility] and didn't get back in time for appointment

## 2014-05-19 ENCOUNTER — Ambulatory Visit (HOSPITAL_COMMUNITY): Payer: BC Managed Care – PPO

## 2014-05-19 ENCOUNTER — Other Ambulatory Visit (HOSPITAL_COMMUNITY): Payer: BC Managed Care – PPO

## 2014-05-21 ENCOUNTER — Ambulatory Visit (HOSPITAL_COMMUNITY)
Admission: RE | Admit: 2014-05-21 | Discharge: 2014-05-21 | Disposition: A | Discharge status: Home / Self Care | Payer: BC Managed Care – PPO | Source: Ambulatory Visit | Attending: Oncology | Admitting: Oncology

## 2014-05-21 ENCOUNTER — Encounter (HOSPITAL_COMMUNITY)
Admission: RE | Admit: 2014-05-21 | Discharge: 2014-05-21 | Disposition: A | Discharge status: Home / Self Care | Payer: BC Managed Care – PPO | Source: Ambulatory Visit | Attending: Oncology | Admitting: Oncology

## 2014-05-21 ENCOUNTER — Encounter (HOSPITAL_COMMUNITY): Payer: Self-pay

## 2014-05-21 DIAGNOSIS — C50912 Malignant neoplasm of unspecified site of left female breast: Secondary | ICD-10-CM

## 2014-05-21 DIAGNOSIS — C50919 Malignant neoplasm of unspecified site of unspecified female breast: Secondary | ICD-10-CM | POA: Diagnosis present

## 2014-05-21 DIAGNOSIS — IMO0001 Reserved for inherently not codable concepts without codable children: Secondary | ICD-10-CM | POA: Diagnosis not present

## 2014-05-21 DIAGNOSIS — C773 Secondary and unspecified malignant neoplasm of axilla and upper limb lymph nodes: Secondary | ICD-10-CM | POA: Insufficient documentation

## 2014-05-21 MED ORDER — HEPARIN SOD (PORK) LOCK FLUSH 100 UNIT/ML IV SOLN
INTRAVENOUS | Status: DC
Start: 2014-05-21 — End: 2014-05-27
  Filled 2014-05-21: qty 5

## 2014-05-21 MED ORDER — TECHNETIUM TC 99M-LABELED RED BLOOD CELLS IV KIT
25.0000 | PACK | Freq: Once | INTRAVENOUS | Status: AC | PRN
  Administered 2014-05-21: 25 via INTRAVENOUS

## 2014-05-21 NOTE — Progress Notes (Signed)
Physical Therapy Treatment Patient Details  Name: Heather Vaughan MRN: [id number] Date of Birth: 1956/02/27  Today's Date: 05/21/2014 Time: 1555-1640 PT Time Calculation (min): 45 min Visit#: 3 of 8  Re-eval: 06/07/14 Charges:  therex 1555-1630 (35'), manual 1630-1640 (10')    Subjective: Symptoms/Limitations Symptoms: Pt reports compliance with HEP.  States she is sore and stiff in her bilateral feet and lateral ankles.   Exercise/Treatments Ankle Stretches Slant Board Stretch: 3 reps;30 seconds Other Stretch: soleus stretch slant board 3X30" Ankle Exercises - Standing SLS: x 3 B best=3" Lt, 2" Rt. no UE's Rocker Board: Limitations Rocker Board Limitations: 10 each R/L A/P Heel Raises: 15 reps Toe Raise: 15 reps Other Standing Ankle Exercises: tandem stance B 30" X 3  no UE's   Manual Therapy Manual Therapy: Other (comment) Other Manual Therapy: joint mobilizations to increase ROM (focus on increasing inversion bilaterally), PROM and Myofascial techniques to lateral ankles  Physical Therapy Assessment and Plan PT Assessment and Plan Clinical Impression Statement: continued with manual techniques to increase ROM in bilateral ankles.  Noted improvement in gait today.  Progressed standing exercises with rebounder and added soleus stretch. PT Plan: Focus on improving ankle inversion, heel-toe gait and general LE stability.     Problem List Patient Active Problem List   Diagnosis Date Noted  . Peripheral neuropathy due to chemotherapy 05/08/2014  . Unstable balance 05/08/2014  . Stiffness of joint, not elsewhere classified, ankle and foot 05/08/2014  . Ankle weakness 05/08/2014  . Invasive ductal carcinoma metastasized to axillary lymph node, left 08/09/2013  . Barrett esophagus 12/22/2011  . GERD (gastroesophageal reflux disease) 06/23/2011  . Hypertension 06/23/2011    General Behavior During Therapy: [facility] for tasks assessed/performed   Teena Irani,  PTA/CLT 05/21/2014, 4:54 PM

## 2014-05-22 ENCOUNTER — Ambulatory Visit (HOSPITAL_COMMUNITY)
Admission: RE | Admit: 2014-05-22 | Discharge: 2014-05-22 | Disposition: A | Discharge status: Home / Self Care | Payer: BC Managed Care – PPO | Source: Ambulatory Visit | Attending: Oncology | Admitting: Oncology

## 2014-05-22 DIAGNOSIS — IMO0001 Reserved for inherently not codable concepts without codable children: Secondary | ICD-10-CM | POA: Diagnosis not present

## 2014-05-22 DIAGNOSIS — M25673 Stiffness of unspecified ankle, not elsewhere classified: Secondary | ICD-10-CM

## 2014-05-22 DIAGNOSIS — T451X5A Adverse effect of antineoplastic and immunosuppressive drugs, initial encounter: Secondary | ICD-10-CM

## 2014-05-22 DIAGNOSIS — R29898 Other symptoms and signs involving the musculoskeletal system: Secondary | ICD-10-CM

## 2014-05-22 DIAGNOSIS — R2689 Other abnormalities of gait and mobility: Secondary | ICD-10-CM

## 2014-05-22 DIAGNOSIS — M25676 Stiffness of unspecified foot, not elsewhere classified: Secondary | ICD-10-CM

## 2014-05-22 DIAGNOSIS — G620 Drug-induced polyneuropathy: Secondary | ICD-10-CM

## 2014-05-22 NOTE — Progress Notes (Signed)
Physical Therapy Treatment Patient Details  Name: Heather Vaughan MRN: [id number] Date of Birth: [birth year]  Today's Date: 05/22/2014 Time: 4696-2952 PT Time Calculation (min): 48 min   Charges: TE 8413-2440, Manual 1027-2536  Visit#: 4 of 8  Re-eval: 06/07/14 Assessment Diagnosis: peripheral neuropathy Prior Therapy: none  Authorization: BCBS   Subjective: Symptoms/Limitations Symptoms: Patient reports no pain. Notes pain only with active inversion Pain Assessment Currently in Pain?: No/denies  Exercise/Treatments Ankle Stretches Gastroc Stretch: 3 reps;20 seconds Slant Board Stretch: 3 reps;30 seconds Other Stretch: soleus stretch slant board 3X30" Other Stretch: piriformis stretch 3x20 seconds Ankle Exercises - Standing Rocker Board: Limitations Rocker Board Limitations: 20 each R/L A/P Heel Raises: Limitations Heel Raises Limitations: neutral, IR, Er 10x each  Toe Raise: Limitations Toe Raise Limitations: neutral, IR, Er 10x each   Manual Therapy Other Manual Therapy: joint mobilizations to increase ROM (focus on increasing inversion bilaterally), PROM and Myofascial techniques to lateral ankles  Physical Therapy Assessment and Plan PT Assessment and Plan Clinical Impression Statement: CFonitnued focus on improving ankle inversion ROM as well as general foot mobility. Introduced piriformis stretch to increase hip internal rotation bilaterally to decrease excessive toe out durign gait and better preposition foot  for transition phase 1 of gait.  PT Plan: Focus on improving ankle inversion, heel-toe gait and general LE stability. Add squat walk around to increase hip internal rotation    Goals PT Short Term Goals PT Short Term Goal 1: Pt pain to be no greater than a 7/10  PT Short Term Goal 1 - Progress: Progressing toward goal PT Short Term Goal 2: Pt to be able to stand for  15 minutes with no fatigue PT Short Term Goal 2 - Progress: Progressing toward goal PT  Short Term Goal 3: Pt to be walking for 15 minutes for better health habits  PT Short Term Goal 3 - Progress: Progressing toward goal PT Short Term Goal 4: PT to verbalize the benefit of using compression hose on her LE  PT Short Term Goal 4 - Progress: Progressing toward goal PT Short Term Goal 5: Pt to be able to verbalize the knowledge of lymphedema and what she should do should she notice any increase swelling in her UE>  PT Short Term Goal 5 - Progress: Progressing toward goal  Problem List Patient Active Problem List   Diagnosis Date Noted  . Peripheral neuropathy due to chemotherapy 05/08/2014  . Unstable balance 05/08/2014  . Stiffness of joint, not elsewhere classified, ankle and foot 05/08/2014  . Ankle weakness 05/08/2014  . Invasive ductal carcinoma metastasized to axillary lymph node, left 08/09/2013  . Barrett esophagus 12/22/2011  . GERD (gastroesophageal reflux disease) 06/23/2011  . Hypertension 06/23/2011    PT - End of Session Activity Tolerance: Patient tolerated treatment well General Behavior During Therapy: [facility] for tasks assessed/performed   Heather Vaughan R 05/22/2014, 4:53 PM

## 2014-05-26 ENCOUNTER — Other Ambulatory Visit (HOSPITAL_COMMUNITY): Payer: Self-pay | Admitting: Oncology

## 2014-05-30 ENCOUNTER — Encounter (HOSPITAL_BASED_OUTPATIENT_CLINIC_OR_DEPARTMENT_OTHER): Payer: BC Managed Care – PPO | Admitting: Oncology

## 2014-05-30 ENCOUNTER — Encounter (HOSPITAL_COMMUNITY): Payer: BC Managed Care – PPO | Attending: Hematology and Oncology

## 2014-05-30 VITALS — BP 139/74 | HR 76 | Temp 98.2°F | Resp 16 | Wt 152.0 lb

## 2014-05-30 DIAGNOSIS — C50919 Malignant neoplasm of unspecified site of unspecified female breast: Secondary | ICD-10-CM | POA: Insufficient documentation

## 2014-05-30 DIAGNOSIS — G620 Drug-induced polyneuropathy: Secondary | ICD-10-CM

## 2014-05-30 DIAGNOSIS — C773 Secondary and unspecified malignant neoplasm of axilla and upper limb lymph nodes: Secondary | ICD-10-CM | POA: Insufficient documentation

## 2014-05-30 DIAGNOSIS — C50912 Malignant neoplasm of unspecified site of left female breast: Secondary | ICD-10-CM

## 2014-05-30 DIAGNOSIS — Z5112 Encounter for antineoplastic immunotherapy: Secondary | ICD-10-CM

## 2014-05-30 DIAGNOSIS — T451X5A Adverse effect of antineoplastic and immunosuppressive drugs, initial encounter: Secondary | ICD-10-CM

## 2014-05-30 DIAGNOSIS — G622 Polyneuropathy due to other toxic agents: Secondary | ICD-10-CM

## 2014-05-30 DIAGNOSIS — R600 Localized edema: Secondary | ICD-10-CM

## 2014-05-30 LAB — CBC WITH DIFFERENTIAL/PLATELET
Basophils Absolute: 0 10*3/uL (ref 0.0–0.1)
Basophils Relative: 1 % (ref 0–1)
EOS ABS: 1.4 10*3/uL — AB (ref 0.0–0.7)
EOS PCT: 24 % — AB (ref 0–5)
HEMATOCRIT: 30.9 % — AB (ref 36.0–46.0)
Hemoglobin: 10.4 g/dL — ABNORMAL LOW (ref 12.0–15.0)
LYMPHS ABS: 1.2 10*3/uL (ref 0.7–4.0)
Lymphocytes Relative: 20 % (ref 12–46)
MCH: 35.4 pg — ABNORMAL HIGH (ref 26.0–34.0)
MCHC: 33.7 g/dL (ref 30.0–36.0)
MCV: 105.1 fL — AB (ref 78.0–100.0)
MONO ABS: 0.4 10*3/uL (ref 0.1–1.0)
Monocytes Relative: 7 % (ref 3–12)
Neutro Abs: 3 10*3/uL (ref 1.7–7.7)
Neutrophils Relative %: 48 % (ref 43–77)
Platelets: 180 10*3/uL (ref 150–400)
RBC: 2.94 MIL/uL — AB (ref 3.87–5.11)
RDW: 12.6 % (ref 11.5–15.5)
WBC: 6 10*3/uL (ref 4.0–10.5)

## 2014-05-30 LAB — COMPREHENSIVE METABOLIC PANEL
ALT: 14 U/L (ref 0–35)
ANION GAP: 12 (ref 5–15)
AST: 16 U/L (ref 0–37)
Albumin: 4 g/dL (ref 3.5–5.2)
Alkaline Phosphatase: 130 U/L — ABNORMAL HIGH (ref 39–117)
BUN: 15 mg/dL (ref 6–23)
CALCIUM: 9.3 mg/dL (ref 8.4–10.5)
CO2: 28 meq/L (ref 19–32)
CREATININE: 0.74 mg/dL (ref 0.50–1.10)
Chloride: 102 mEq/L (ref 96–112)
GLUCOSE: 97 mg/dL (ref 70–99)
Potassium: 3.6 mEq/L — ABNORMAL LOW (ref 3.7–5.3)
Sodium: 142 mEq/L (ref 137–147)
TOTAL PROTEIN: 7 g/dL (ref 6.0–8.3)
Total Bilirubin: 0.4 mg/dL (ref 0.3–1.2)

## 2014-05-30 MED ORDER — ACETAMINOPHEN 325 MG PO TABS
ORAL_TABLET | ORAL | Status: AC
  Filled 2014-05-30: qty 2

## 2014-05-30 MED ORDER — DIPHENHYDRAMINE HCL 25 MG PO CAPS
50.0000 mg | ORAL_CAPSULE | Freq: Once | ORAL | Status: AC
  Administered 2014-05-30: 50 mg via ORAL

## 2014-05-30 MED ORDER — LACOSAMIDE 100 MG PO TABS: 100.0000 mg | ORAL_TABLET | Freq: Two times a day (BID) | ORAL | Status: DC

## 2014-05-30 MED ORDER — ACETAMINOPHEN 325 MG PO TABS
650.0000 mg | ORAL_TABLET | Freq: Once | ORAL | Status: AC
  Administered 2014-05-30: 650 mg via ORAL

## 2014-05-30 MED ORDER — HEPARIN SOD (PORK) LOCK FLUSH 100 UNIT/ML IV SOLN
500.0000 [IU] | Freq: Once | INTRAVENOUS | Status: AC | PRN
  Administered 2014-05-30: 500 [IU]
  Filled 2014-05-30: qty 5

## 2014-05-30 MED ORDER — SODIUM CHLORIDE 0.9 % IV SOLN
Freq: Once | INTRAVENOUS | Status: AC
  Administered 2014-05-30: 12:00:00 via INTRAVENOUS

## 2014-05-30 MED ORDER — TRASTUZUMAB CHEMO INJECTION 440 MG
6.0000 mg/kg | Freq: Once | INTRAVENOUS | Status: AC
  Administered 2014-05-30: 420 mg via INTRAVENOUS
  Filled 2014-05-30: qty 20

## 2014-05-30 MED ORDER — SPIRONOLACTONE 100 MG PO TABS
100.0000 mg | ORAL_TABLET | Freq: Every day | ORAL | Status: DC
Start: 2014-05-30 — End: 2015-04-24

## 2014-05-30 MED ORDER — DIPHENHYDRAMINE HCL 25 MG PO CAPS
ORAL_CAPSULE | ORAL | Status: AC
  Filled 2014-05-30: qty 2

## 2014-05-30 MED ORDER — SODIUM CHLORIDE 0.9 % IJ SOLN
10.0000 mL | INTRAMUSCULAR | Status: DC | PRN
  Administered 2014-05-30: 10 mL

## 2014-05-30 NOTE — Progress Notes (Signed)
The patient is seen as a work-in today.  She requests a refill on her aldactone for her continue LE edema.  Edema is improved and may be Gabapentin-induced.  As a result, I have asked her to stop the Gabapentin medication in a step-down fashion (ie 2 capsules at bedtime x 2 day, 1 capsule at bedtime x 2 days, then stop).  I have escribed her a refill of Aldactone to her pharmacy.  A case report published in The Journal of Community and Supportive Oncology in May 2015 titled "Significant response to lacosamide in a patient with severe chemotherapy-induced peripheral neuropathy" demonstrated a immediate and significant improvement in Cisplatin-induced peripheral neuropathy that was severe after failure of 1200 mg of Gabapentin and narcotics.    Due to her continue peripheral neuropathy affecting the patient's ability to ambulate normally, and her continued lower extremity edema (possibly Gabapentin-induced), a change in medication is warranted with a discontinuation of Gabapentin.  The aforementioned case report demonstrated that the patient tolerated lacosamide well without any side effects and experienced a significant response to this therapy.  He was started on 100 mg BID and we will copy this regimen for Heather Vaughan.  She will update [place] with results and she will return as scheduled for follow-up.  Patient and plan discussed with Dr. Farrel Gobble and he is in agreement with the aforementioned.    Heather Vaughan 05/30/2014

## 2014-05-30 NOTE — Patient Instructions (Signed)
Decrease Gabapentin by 1 capsule every 2 nights until you are off the medication (ie: 2 capsules at bedtime for 2 days, then 1 capsule at bedtime for 2 days, then stop).  Prescription called in to your pharmacy for Aldactone (fluid pill)  Prescription for Lacosamide 100 mg tablets twice per day printed and given to you in the clinic for peripheral neuropathy.  Return as planned for therapy and follow-up appointment.

## 2014-05-30 NOTE — Progress Notes (Signed)
Tolerated infusion well. Discussion with T. Sheldon Silvan PA today to  follow up with neuropathy and new meds prescribed

## 2014-06-04 ENCOUNTER — Ambulatory Visit (HOSPITAL_COMMUNITY)
Admission: RE | Admit: 2014-06-04 | Discharge: 2014-06-04 | Disposition: A | Discharge status: Home / Self Care | Payer: BC Managed Care – PPO | Source: Ambulatory Visit | Attending: Oncology | Admitting: Oncology

## 2014-06-04 DIAGNOSIS — G620 Drug-induced polyneuropathy: Secondary | ICD-10-CM

## 2014-06-04 DIAGNOSIS — G629 Polyneuropathy, unspecified: Secondary | ICD-10-CM | POA: Diagnosis not present

## 2014-06-04 DIAGNOSIS — R2689 Other abnormalities of gait and mobility: Secondary | ICD-10-CM

## 2014-06-04 DIAGNOSIS — T451X5A Adverse effect of antineoplastic and immunosuppressive drugs, initial encounter: Secondary | ICD-10-CM

## 2014-06-04 DIAGNOSIS — R29898 Other symptoms and signs involving the musculoskeletal system: Secondary | ICD-10-CM

## 2014-06-04 NOTE — Evaluation (Signed)
outPhysical Therapy Evaluation  Patient Details  Name: Heather Vaughan MRN: [id number] Date of Birth: Feb 17, 1956  Today's Date: 06/04/2014 Time: 5400-8676 PT Time Calculation (min): 43 min     Charges: TE [id number], manual [id number]          Visit#: 5 of 8  Re-eval: 06/07/14 Assessment Diagnosis: peripheral neuropathy Prior Therapy: none  Authorization: [employer]    Authorization Time Period:    Authorization Visit#:   of     Past Medical History:  Past Medical History  Diagnosis Date  . GERD (gastroesophageal reflux disease)   . Hypertension   . Bronchitis 07/27/2011  . Barrett's esophagus   . Anxiety   . Breast cancer, left breast    Past Surgical History:  Past Surgical History  Procedure Laterality Date  . Excision morton's neuroma  07/14/2011    Procedure: EXCISION MORTON'S NEUROMA;  Surgeon: Marcheta Grammes;  Location: [facility];  Service: Orthopedics;  Laterality: Right;  Removal Neuroma 3rd Interspace Right Foot  . Esophagogastroduodenoscopy  08/03/2011    Procedure: ESOPHAGOGASTRODUODENOSCOPY (EGD);  Surgeon: Rogene Houston, MD;  Location: [facility];  Service: Endoscopy;  Laterality: N/A;  12:45  . Partial mastectomy with needle localization Left 07/03/2013    Procedure: PARTIAL MASTECTOMY STATUS POST NEEDLE LOCALIZATION;  Surgeon: Scherry Ran, MD;  Location: [facility];  Service: General;  Laterality: Left;  . Breast biopsy Bilateral     X 4- benign  . Mastectomy modified radical Left 07/17/2013    Procedure: MASTECTOMY MODIFIED RADICAL WITH LEFT AXILLARY TISSUE REMOVAL;  Surgeon: Scherry Ran, MD;  Location: [facility];  Service: General;  Laterality: Left;  . Portacath placement Right 08/16/2013    Procedure: INSERTION PORT-A-CATH RIGHT SUBCLAVIAN;  Surgeon: Scherry Ran, MD;  Location: [facility];  Service: General;  Laterality: Right;    Subjective Symptoms/Limitations Symptoms: patient reports pain in Rt lateral foot only.  Pain  Assessment Currently in Pain?: No/denies Pain Score: 5   Exercise/Treatments Ankle Stretches Soleus Stretch: Limitations Soleus Stretch Limitations: 3 ways Gastroc Stretch: 20 seconds;2 reps;Limitations Gastroc Stretch Limitations: 3 ways Ankle Exercises - Standing Other Standing Ankle Exercises: 3D ankle excursion 10x Other Standing Ankle Exercises: Flamengo for ankle inversion strengtheing. 10x  Manual Therapy Other Manual Therapy: joint mobilizations to increase ankle inversion (calcaneaous, talus, cuboid)  ROM (focus on increasing inversion bilaterally), PROM and Myofascial techniques to lateral ankles/gastrocs  Physical Therapy Assessment and Plan PT Assessment and Plan Clinical Impression Statement: Conitnued focus on improving ankle/calcaneous inversion ROM as well as general foot mobility. session ended with cfocus on increaseing strength of calcaneal inverters. patient ntoed conintued pain throughtou session that only improved following mannual therapy PT Plan: Focus on improving ankle inversion, heel-toe gait and general LE stability. Add squat walk around to increase hip internal rotation, conitnue ankle inversion flamengo, and add toe in heel raises.     Goals PT Short Term Goals PT Short Term Goal 1: Pt pain to be no greater than a 7/10  PT Short Term Goal 1 - Progress: Progressing toward goal PT Short Term Goal 2: Pt to be able to stand for  15 minutes with no fatigue PT Short Term Goal 2 - Progress: Progressing toward goal PT Short Term Goal 3: Pt to be walking for 15 minutes for better health habits  PT Short Term Goal 3 - Progress: Progressing toward goal PT Short Term Goal 4: PT to verbalize the benefit of using compression hose on her LE  PT Short Term Goal 4 - Progress: Progressing toward goal PT Short Term Goal 5: Pt to be able to verbalize the knowledge of lymphedema and what she should do should she notice any increase swelling in her UE>  PT Short Term Goal 5  - Progress: Progressing toward goal  Problem List Patient Active Problem List   Diagnosis Date Noted  . Peripheral neuropathy due to chemotherapy 05/08/2014  . Unstable balance 05/08/2014  . Stiffness of joint, not elsewhere classified, ankle and foot 05/08/2014  . Ankle weakness 05/08/2014  . Invasive ductal carcinoma metastasized to axillary lymph node, left 08/09/2013  . Barrett esophagus 12/22/2011  . GERD (gastroesophageal reflux disease) 06/23/2011  . Hypertension 06/23/2011    PT - End of Session Activity Tolerance: Patient tolerated treatment well General Behavior During Therapy: [facility] for tasks assessed/performed  GP    Tymeka Privette R 06/04/2014, 2:31 PM  Physician Documentation Your signature is required to indicate approval of the treatment plan as stated above.  Please sign and either send electronically or make a copy of this report for your files and return this physician signed original.   Please mark one 1.__approve of plan  2. ___approve of plan with the following conditions.   ______________________________                                                          _____________________ Physician Signature                                                                                                             Date

## 2014-06-05 ENCOUNTER — Ambulatory Visit (HOSPITAL_COMMUNITY)
Admission: RE | Admit: 2014-06-05 | Discharge: 2014-06-05 | Disposition: A | Discharge status: Home / Self Care | Payer: BC Managed Care – PPO | Source: Ambulatory Visit | Attending: Physical Therapy | Admitting: Physical Therapy

## 2014-06-05 DIAGNOSIS — G629 Polyneuropathy, unspecified: Secondary | ICD-10-CM | POA: Diagnosis not present

## 2014-06-05 NOTE — Progress Notes (Signed)
Physical Therapy Treatment Patient Details  Name: Heather Vaughan MRN: [id number] Date of Birth: 10/28/1955  Today's Date: 06/05/2014 Time: 1275-1700 PT Time Calculation (min): 56 min TE 1301-1336, MT 1749-4496  Visit#: 6 of 8  Re-eval: 06/07/14 Assessment Diagnosis: peripheral neuropathy   Subjective: Symptoms/Limitations Symptoms: Pt reports pain 6/10 in Rt lateral foot.  Pain Assessment Currently in Pain?: Yes Pain Score: 6  Pain Location: Foot Pain Orientation: Right;Lateral   Exercise/Treatments Ankle Stretches Soleus Stretch: 2 reps;30 seconds Gastroc Stretch: 20 seconds;2 reps;Limitations Gastroc Stretch Limitations: 3 ways Other Stretch: piriformis stretch  2x30", seated Ankle Exercises - Standing SLS: Lt 4", Rt 2"  Heel Raises: 10 reps Heel Raises Limitations: neutral, IR, Er Toe Raise: 10 reps Toe Raise Limitations: neutral, IR, Er  Other Standing Ankle Exercises: Squat Walk Around 3x each LE Other Standing Ankle Exercises: Tandem Stance Lt 8", 10"   Rt 10", 6"  Manual Therapy Manual Therapy: Other (comment) Joint Mobilization: Joint mobs for increasing ankle mobility, with talar/calcaeous mobs Other Manual Therapy: PROM ankle inversion/eversion, ice cup massage 2' to Rt lateral ankle  Physical Therapy Assessment and Plan PT Assessment and Plan Clinical Impression Statement: Pt reports complaints of Rt lateral ankle pain, with increasing pain with toe raises with internal rotation only.  VC with HR/TR for correct technique for WB over toes vs. rolling ankles out.  Noted difficulty/limited movement with ankle inversion < eversion, requiring manual techniques for joint mobs and PROM to complete movement.  Added ice cup massage to Rt lateral ankle secondary to complaints of pain on lateral ankle, with education to continue at home if pain/swelling is present.   Pt will benefit from skilled therapeutic intervention in order to improve on the following deficits:  Decreased balance;Decreased strength;Pain;Decreased range of motion PT Treatment/Interventions: Gait training;Therapeutic exercise;Therapeutic activities;Manual techniques;Balance training PT Plan: Focus on improving ankle inversion, heel-toe gait and general LE stability.     Goals PT Short Term Goals PT Short Term Goal 1: Pt pain to be no greater than a 7/10  PT Short Term Goal 1 - Progress: Progressing toward goal  Problem List Patient Active Problem List   Diagnosis Date Noted  . Peripheral neuropathy due to chemotherapy 05/08/2014  . Unstable balance 05/08/2014  . Stiffness of joint, not elsewhere classified, ankle and foot 05/08/2014  . Ankle weakness 05/08/2014  . Invasive ductal carcinoma metastasized to axillary lymph node, left 08/09/2013  . Barrett esophagus 12/22/2011  . GERD (gastroesophageal reflux disease) 06/23/2011  . Hypertension 06/23/2011    PT - End of Session Activity Tolerance: Patient tolerated treatment well General Behavior During Therapy: [facility] for tasks assessed/performed   Heather Vaughan 06/05/2014, 2:14 PM

## 2014-06-09 ENCOUNTER — Ambulatory Visit (HOSPITAL_COMMUNITY)
Admission: RE | Admit: 2014-06-09 | Discharge: 2014-06-09 | Disposition: A | Discharge status: Home / Self Care | Payer: BC Managed Care – PPO | Source: Ambulatory Visit | Attending: Physical Therapy | Admitting: Physical Therapy

## 2014-06-09 ENCOUNTER — Ambulatory Visit (HOSPITAL_COMMUNITY)
Admission: RE | Admit: 2014-06-09 | Discharge: 2014-06-09 | Disposition: A | Discharge status: Home / Self Care | Payer: BC Managed Care – PPO | Source: Ambulatory Visit | Attending: Oncology | Admitting: Oncology

## 2014-06-09 DIAGNOSIS — Z87891 Personal history of nicotine dependence: Secondary | ICD-10-CM | POA: Diagnosis not present

## 2014-06-09 DIAGNOSIS — K2270 Barrett's esophagus without dysplasia: Secondary | ICD-10-CM | POA: Diagnosis not present

## 2014-06-09 DIAGNOSIS — R911 Solitary pulmonary nodule: Secondary | ICD-10-CM | POA: Diagnosis present

## 2014-06-09 DIAGNOSIS — G629 Polyneuropathy, unspecified: Secondary | ICD-10-CM | POA: Diagnosis not present

## 2014-06-09 DIAGNOSIS — Z09 Encounter for follow-up examination after completed treatment for conditions other than malignant neoplasm: Secondary | ICD-10-CM | POA: Insufficient documentation

## 2014-06-09 DIAGNOSIS — Z853 Personal history of malignant neoplasm of breast: Secondary | ICD-10-CM | POA: Diagnosis not present

## 2014-06-09 DIAGNOSIS — K219 Gastro-esophageal reflux disease without esophagitis: Secondary | ICD-10-CM | POA: Diagnosis not present

## 2014-06-09 DIAGNOSIS — R918 Other nonspecific abnormal finding of lung field: Secondary | ICD-10-CM

## 2014-06-09 NOTE — Progress Notes (Signed)
Physical Therapy Treatment Patient Details  Name: Heather Vaughan MRN: [id number] Date of Birth: 1955/12/24  Today's Date: 06/09/2014 Time: 9242-6834 PT Time Calculation (min): 42 min TE [id number], [place] 9892-1194  Visit#: 7 of 8  Re-eval: 06/07/14 Assessment Diagnosis: peripheral neuropathy Prior Therapy: none  Subjective: Symptoms/Limitations Symptoms: Pt reports she is going to see MD Caprice Beaver tomorrow regarding her ankle pain.  Pain Assessment Pain Score: 6  Pain Location: Foot Pain Orientation: Right;Lateral;Anterior   Exercise/Treatments Ankle Stretches Soleus Stretch: 2 reps;30 seconds Gastroc Stretch: 20 seconds;2 reps;Limitations Gastroc Stretch Limitations: 3 ways Other Stretch: piriformis stretch  2x30", seated Ankle Exercises - Standing Heel Raises: 10 reps Heel Raises Limitations: neutral, IR, Er Toe Raise: 10 reps Toe Raise Limitations: neutral, IR, Er     Modalities Modalities: Ultrasound Ultrasound Ultrasound Location: Rt lateral ankle Ultrasound Parameters: 3 mHz, 0.8 w/cm2, non-continuous Ultrasound Goals: Pain  Physical Therapy Assessment and Plan PT Assessment and Plan Clinical Impression Statement: Pt continues to report lateral ankle pain, despite MT/ice and therapuetic exericses.  No visible swelling present today in ankle, though some pain reported with deep palpation.  Added [place] for pain management, at low settings secondary to hx of breast cancer, though pt is not activitly recieving chemo/radiation treatments at this time.   Assess [place] treatment tomorrow.  Pt to see MD Caprice Beaver tomorrow for assessment of ankle/foot pain and pt due for re-assessment tomorrow; will determine if pt is to continue or if medical treatment is to be considerd after MD visit and re-assessment tomorrow.   Pt will benefit from skilled therapeutic intervention in order to improve on the following deficits: Decreased balance;Decreased strength;Pain;Decreased range of  motion PT Plan: Re-assessment next visit.  Assess outcome of MD visit.     Goals PT Short Term Goals PT Short Term Goal 1: Pt pain to be no greater than a 7/10  PT Short Term Goal 1 - Progress: Progressing toward goal PT Short Term Goal 2: Pt to be able to stand for  15 minutes with no fatigue PT Short Term Goal 2 - Progress: Progressing toward goal PT Short Term Goal 3: Pt to be walking for 15 minutes for better health habits  PT Short Term Goal 3 - Progress: Progressing toward goal  Problem List Patient Active Problem List   Diagnosis Date Noted  . Peripheral neuropathy due to chemotherapy 05/08/2014  . Unstable balance 05/08/2014  . Stiffness of joint, not elsewhere classified, ankle and foot 05/08/2014  . Ankle weakness 05/08/2014  . Invasive ductal carcinoma metastasized to axillary lymph node, left 08/09/2013  . Barrett esophagus 12/22/2011  . GERD (gastroesophageal reflux disease) 06/23/2011  . Hypertension 06/23/2011    PT - End of Session Activity Tolerance: Patient tolerated treatment well General Behavior During Therapy: [facility] for tasks assessed/performed   Russie Gulledge 06/09/2014, 4:59 PM

## 2014-06-10 ENCOUNTER — Ambulatory Visit (HOSPITAL_COMMUNITY)
Admission: RE | Admit: 2014-06-10 | Discharge: 2014-06-10 | Disposition: A | Discharge status: Home / Self Care | Payer: BC Managed Care – PPO | Source: Ambulatory Visit | Attending: Physical Therapy | Admitting: Physical Therapy

## 2014-06-10 DIAGNOSIS — G629 Polyneuropathy, unspecified: Secondary | ICD-10-CM | POA: Diagnosis not present

## 2014-06-10 NOTE — Progress Notes (Signed)
Physical Therapy Re-evaluation  Patient Details  Name: Heather Vaughan MRN: [id number] Date of Birth: 30-Aug-1955  Today's Date: 06/10/2014 Time: 1604-1700 PT Time Calculation (min): 56 min              Visit#: 8 of 8  Re-eval: 07/08/14 Assessment Diagnosis: peripheral neuropathy Prior Therapy: none Charges:  therex 9528-4132 (79'), MMT/ROM 4401-0272, [place] 1650-1700  Subjective Pt states she went to MD and he wants to have MRI's done to determine why she is still having problems.  Pt reports pain of 4/10 today Rt lateral ankle.  Pt states the [place] helped a lot last visit.   Assessment RLE AROM (degrees) Right Ankle Dorsiflexion: 10 (was 10 degrees) Right Ankle Plantar Flexion: 55 (was 55 degrees) Right Ankle Inversion: 10 (was 5 degrees) Right Ankle Eversion: 20 (was 18 degrees) RLE Strength Right Hip External Rotation : 70 Right Hip Internal Rotation : 20 Right Ankle Dorsiflexion: 4/5 (was 3+/5) Right Ankle Plantar Flexion: 3-/5 (was 2/5) Right Ankle Inversion: 3-/5 (was 3-/5) Right Ankle Eversion: 4/5 (was 4/5) LLE AROM (degrees) Left Hip External Rotation : 70 Left Hip Internal Rotation : 25 Left Ankle Dorsiflexion: 17 (was 17 degrees) Left Ankle Plantar Flexion: 45 (was 45 degrees) Left Ankle Inversion: 20 (was 10 degrees) Left Ankle Eversion: 15 (was 15 degrees) LLE Strength Left Ankle Dorsiflexion: 5/5 (was 5/5) Left Ankle Plantar Flexion: 3-/5 (was 3-/5) Left Ankle Inversion: 3-/5 (was 3-/5) Left Ankle Eversion: 3/5 (was 3/5)  Exercise/Treatments Ankle Stretches Soleus Stretch: 2 reps;30 seconds Gastroc Stretch: 20 seconds;2 reps;Limitations Gastroc Stretch Limitations: 3 ways Ankle Exercises - Standing Heel Raises: 10 reps Heel Raises Limitations: neutral, IR, Er Toe Raise: 10 reps Toe Raise Limitations: neutral, IR, Er     Modalities Modalities: Ultrasound Ultrasound Ultrasound Location: Rt lateral ankle Ultrasound Parameters: 3 MHz, 0.8 w/cm2  pulsed Ultrasound Goals: Pain  Physical Therapy Assessment and Plan PT Assessment and Plan Clinical Impression Statement: Pt has completed 8 visits for bilateral ankle pain and dysfunction.  Pt has met all STG's and progressing toward LTG's.  Largest gains made in bilateral ankle inversion ROM with other improvments in strength and stability.  Pt returned to MD today and is scheduled for MRI.  Pt would benefit from continued therapy unless MD disagrees following results from MRI. Pt will benefit from skilled therapeutic intervention in order to improve on the following deficits: Decreased balance;Decreased strength;Pain;Decreased range of motion PT Frequency: Min 2X/week PT Duration: 4 weeks PT Treatment/Interventions: Gait training;Therapeutic exercise;Therapeutic activities;Manual techniques;Balance training PT Plan: Recommend continuation of PT for 4 more weeks MRI pending.    Goals Home Exercise Program PT Goal: Perform Home Exercise Program - Progress: Met PT Short Term Goals PT Short Term Goal 1: Pt pain to be no greater than a 7/10  PT Short Term Goal 1 - Progress: Met PT Short Term Goal 2: Pt to be able to stand for  15 minutes with no fatigue PT Short Term Goal 2 - Progress: Met PT Short Term Goal 3: Pt to be walking for 15 minutes for better health habits  PT Short Term Goal 3 - Progress: Met PT Short Term Goal 4 - Progress: Discontinued (comment) PT Long Term Goals PT Long Term Goal 1 - Progress: Progressing toward goal PT Long Term Goal 2 - Progress: Progressing toward goal Long Term Goal 3 Progress: Progressing toward goal Long Term Goal 4 Progress: Progressing toward goal  Problem List Patient Active Problem List   Diagnosis Date Noted  .  Peripheral neuropathy due to chemotherapy 05/08/2014  . Unstable balance 05/08/2014  . Stiffness of joint, not elsewhere classified, ankle and foot 05/08/2014  . Ankle weakness 05/08/2014  . Invasive ductal carcinoma metastasized to  axillary lymph node, left 08/09/2013  . Barrett esophagus 12/22/2011  . GERD (gastroesophageal reflux disease) 06/23/2011  . Hypertension 06/23/2011      Teena Irani, PTA/CLT 06/10/2014, 6:32 PM

## 2014-06-10 NOTE — Progress Notes (Signed)
Physical Therapy Re-evaluation  Patient Details  Name: Heather Vaughan MRN: [id number] Date of Birth: 30-Aug-1955  Today's Date: 06/10/2014 Time: 1604-1700 PT Time Calculation (min): 56 min              Visit#: 8 of 8  Re-eval: 07/08/14 Assessment Diagnosis: peripheral neuropathy Prior Therapy: none Charges:  therex [id number] (79'), MMT/ROM 4401-0272, [place] 1650-1700  Subjective Pt states she went to MD and he wants to have MRI's done to determine why she is still having problems.  Pt reports pain of 4/10 today Rt lateral ankle.  Pt states the [place] helped a lot last visit.   Assessment RLE AROM (degrees) Right Ankle Dorsiflexion: 10 (was 10 degrees) Right Ankle Plantar Flexion: 55 (was 55 degrees) Right Ankle Inversion: 10 (was 5 degrees) Right Ankle Eversion: 20 (was 18 degrees) RLE Strength Right Hip External Rotation : 70 Right Hip Internal Rotation : 20 Right Ankle Dorsiflexion: 4/5 (was 3+/5) Right Ankle Plantar Flexion: 3-/5 (was 2/5) Right Ankle Inversion: 3-/5 (was 3-/5) Right Ankle Eversion: 4/5 (was 4/5) LLE AROM (degrees) Left Hip External Rotation : 70 Left Hip Internal Rotation : 25 Left Ankle Dorsiflexion: 17 (was 17 degrees) Left Ankle Plantar Flexion: 45 (was 45 degrees) Left Ankle Inversion: 20 (was 10 degrees) Left Ankle Eversion: 15 (was 15 degrees) LLE Strength Left Ankle Dorsiflexion: 5/5 (was 5/5) Left Ankle Plantar Flexion: 3-/5 (was 3-/5) Left Ankle Inversion: 3-/5 (was 3-/5) Left Ankle Eversion: 3/5 (was 3/5)  Exercise/Treatments Ankle Stretches Soleus Stretch: 2 reps;30 seconds Gastroc Stretch: 20 seconds;2 reps;Limitations Gastroc Stretch Limitations: 3 ways Ankle Exercises - Standing Heel Raises: 10 reps Heel Raises Limitations: neutral, IR, Er Toe Raise: 10 reps Toe Raise Limitations: neutral, IR, Er     Modalities Modalities: Ultrasound Ultrasound Ultrasound Location: Rt lateral ankle Ultrasound Parameters: 3 MHz, 0.8 w/cm2  pulsed Ultrasound Goals: Pain  Physical Therapy Assessment and Plan PT Assessment and Plan Clinical Impression Statement: Pt has completed 8 visits for bilateral ankle pain and dysfunction.  Pt has met all STG's and progressing toward LTG's.  Largest gains made in bilateral ankle inversion ROM with other improvments in strength and stability.  Pt returned to MD today and is scheduled for MRI.  Pt would benefit from continued therapy unless MD disagrees following results from MRI. Pt will benefit from skilled therapeutic intervention in order to improve on the following deficits: Decreased balance;Decreased strength;Pain;Decreased range of motion PT Frequency: Min 2X/week PT Duration: 4 weeks PT Treatment/Interventions: Gait training;Therapeutic exercise;Therapeutic activities;Manual techniques;Balance training PT Plan: Recommend continuation of PT for 4 more weeks MRI pending.    Goals Home Exercise Program PT Goal: Perform Home Exercise Program - Progress: Met PT Short Term Goals PT Short Term Goal 1: Pt pain to be no greater than a 7/10  PT Short Term Goal 1 - Progress: Met PT Short Term Goal 2: Pt to be able to stand for  15 minutes with no fatigue PT Short Term Goal 2 - Progress: Met PT Short Term Goal 3: Pt to be walking for 15 minutes for better health habits  PT Short Term Goal 3 - Progress: Met PT Short Term Goal 4 - Progress: Discontinued (comment) PT Long Term Goals PT Long Term Goal 1 - Progress: Progressing toward goal PT Long Term Goal 2 - Progress: Progressing toward goal Long Term Goal 3 Progress: Progressing toward goal Long Term Goal 4 Progress: Progressing toward goal  Problem List Patient Active Problem List   Diagnosis Date Noted  .  Peripheral neuropathy due to chemotherapy 05/08/2014  . Unstable balance 05/08/2014  . Stiffness of joint, not elsewhere classified, ankle and foot 05/08/2014  . Ankle weakness 05/08/2014  . Invasive ductal carcinoma metastasized to  axillary lymph node, left 08/09/2013  . Barrett esophagus 12/22/2011  . GERD (gastroesophageal reflux disease) 06/23/2011  . Hypertension 06/23/2011      Teena Irani, PTA/CLT 06/10/2014, 6:32 PM  Nakema Fake PT, DPT

## 2014-06-12 ENCOUNTER — Other Ambulatory Visit (HOSPITAL_COMMUNITY): Payer: Self-pay | Admitting: Podiatry

## 2014-06-12 DIAGNOSIS — S96911A Strain of unspecified muscle and tendon at ankle and foot level, right foot, initial encounter: Secondary | ICD-10-CM

## 2014-06-16 ENCOUNTER — Ambulatory Visit (HOSPITAL_COMMUNITY): Payer: BC Managed Care – PPO | Admitting: Oncology

## 2014-06-16 NOTE — Progress Notes (Signed)
[place], [place]-C [address] [place] [address]  Breast cancer metastasized to axillary lymph node, left  CURRENT THERAPY: Started Aromasin on 03/29/2014. Completing 52 weeks worth of Herceptin therapy finishing in December 2015. Undergoing radiation therapy. S/P cycle 6 of TCH + Neulasta support.  INTERVAL HISTORY: Heather Vaughan 58 y.o. female returns for  regular  visit for followup of Invasive ductal carcinoma of left breast with high grade DCIS. S/P left partial mastectomy on 07/03/2013 demonstrating a 1.4 cm invasive component that was ER 74%, PR 16%, Ki-67 52%, and HER2 negative. However, positive margins were noted and therefore on 07/17/2013 she underwent a left modified radical mastectomy and left axillary lymph node dissection. Clear margins were ascertained with 6/07 positive lymph nodes positive for metastatic disease. Testing on the positive lymph node reveals HER2 positivity. OncoType DX score was 29 placing her in the high-end of the intermediate risk group for recurrence. She was therefore started on [facility] therapy on 08/27/2013 and finished TC on 12/18/2013 after 6 cycles. She is S/P radiation therapy by Dr. Pablo Ledger on 01/27/2014- 03/14/2014. She is now completing 52 weeks worth of Herceptin therapy and Aromasin (beginning on on 03/29/2014).     Invasive ductal carcinoma metastasized to axillary lymph node, left   05/28/2013 Imaging Screening mammogram   06/19/2013 Imaging Left diagnostic mammogram   06/25/2013 Imaging CT CAP- borderline enlarged axillary lymph node on left.  Small pulmonary nodules noted but too small for PET or biopsy; surveillance recommended.    07/03/2013 Surgery Left partial mastectomy.  Invasive ductal carcinoma. 1.4 cm.  POSITIVE MARGINS.  Additional extensive DCIS noted.  ER 74%, PR 16%, Ki-67 52%, HER2 NEGATIVE.   07/17/2013 Surgery Left modified radiacl mastectomy with left axillary node dissection.  Negative residual cancer.  Negative inked  margins.  1/12 positive lymph nodes.  FINAL MARGINS NEGATIVE.  LYMPH NODE IS HER2 POSITIVE.   08/13/2013 Imaging MUGA scan shows left ventricular EF of 62%   08/27/2013 - 12/18/2013 Chemotherapy TCH x 6 cycles   01/07/2014 -  Chemotherapy Herceptin x 52 weeks   01/27/2014 - 03/13/2014 Radiation Therapy By Dr. Pablo Ledger   03/29/2014 -  Chemotherapy Aromasin to start on 03/29/2014   I personally reviewed and went over laboratory results with the patient.  The results are noted within this dictation.  She reports that she is having an MRI of left foot later today for continued discomfort.  I do not think this is peripheral neuropathy.  She is seeing podiatrist, Dr. Caprice Beaver, who has ordered the test.  By the patient's description, I believe her peripheral neuropathy is improved on Lacosamide.  Additionally, her peripheral edema is improved.  Oncologically, she denies any complaints and ROS questioning is negative.  Past Medical History  Diagnosis Date  . GERD (gastroesophageal reflux disease)   . Hypertension   . Bronchitis 07/27/2011  . Barrett's esophagus   . Anxiety   . Breast cancer, left breast     has GERD (gastroesophageal reflux disease); Hypertension; Barrett esophagus; Invasive ductal carcinoma metastasized to axillary lymph node, left; Peripheral neuropathy due to chemotherapy; Unstable balance; Stiffness of joint, not elsewhere classified, ankle and foot; and Ankle weakness on her problem list.     has No Known Allergies.  Ms. Kregel does not currently have medications on file.  Past Surgical History  Procedure Laterality Date  . Excision morton's neuroma  07/14/2011    Procedure: EXCISION MORTON'S NEUROMA;  Surgeon: Marcheta Grammes;  Location: [facility];  Service: Orthopedics;  Laterality: Right;  Removal Neuroma 3rd Interspace Right Foot  . Esophagogastroduodenoscopy  08/03/2011    Procedure: ESOPHAGOGASTRODUODENOSCOPY (EGD);  Surgeon: Rogene Houston, MD;  Location: [facility];  Service: Endoscopy;  Laterality: N/A;  12:45  . Partial mastectomy with needle localization Left 07/03/2013    Procedure: PARTIAL MASTECTOMY STATUS POST NEEDLE LOCALIZATION;  Surgeon: Scherry Ran, MD;  Location: [facility];  Service: General;  Laterality: Left;  . Breast biopsy Bilateral     X 4- benign  . Mastectomy modified radical Left 07/17/2013    Procedure: MASTECTOMY MODIFIED RADICAL WITH LEFT AXILLARY TISSUE REMOVAL;  Surgeon: Scherry Ran, MD;  Location: [facility];  Service: General;  Laterality: Left;  . Portacath placement Right 08/16/2013    Procedure: INSERTION PORT-A-CATH RIGHT SUBCLAVIAN;  Surgeon: Scherry Ran, MD;  Location: [facility];  Service: General;  Laterality: Right;    Denies any headaches, dizziness, double vision, fevers, chills, night sweats, nausea, vomiting, diarrhea, constipation, chest pain, heart palpitations, shortness of breath, blood in stool, black tarry stool, urinary pain, urinary burning, urinary frequency, hematuria.   PHYSICAL EXAMINATION  ECOG PERFORMANCE STATUS: 1 - Symptomatic but completely ambulatory  Filed Vitals:   06/18/14 1345  BP: 123/68  Pulse: 87  Temp: 98.9 F (37.2 C)  Resp: 18    GENERAL:alert, no distress, well nourished, well developed, comfortable, cooperative and smiling SKIN: skin color, texture, turgor are normal, no rashes or significant lesions HEAD: Normocephalic, No masses, lesions, tenderness or abnormalities EYES: normal, PERRLA, EOMI, Conjunctiva are pink and non-injected EARS: External ears normal OROPHARYNX:lips, buccal mucosa, and tongue normal and mucous membranes are moist  NECK: supple, no adenopathy, thyroid normal size, non-tender, without nodularity, no stridor, non-tender, trachea midline LYMPH:  no palpable lymphadenopathy, no hepatosplenomegaly BREAST:right breast normal without mass, skin or nipple changes or axillary nodes, left post-mastectomy site well healed and free of suspicious  changes LUNGS: clear to auscultation and percussion HEART: regular rate & rhythm, no murmurs, no gallops, S1 normal and S2 normal ABDOMEN:abdomen soft, non-tender, obese, normal bowel sounds, no masses or organomegaly and no hepatosplenomegaly BACK: Back symmetric, no curvature., No CVA tenderness EXTREMITIES:less then 2 second capillary refill, no joint deformities, effusion, or inflammation, no skin discoloration, no clubbing, no cyanosis, positive findings:  edema 1 + pitting edema B/L ankles and feet.  NEURO: alert & oriented x 3 with fluent speech, no focal motor/sensory deficits   LABORATORY DATA: CBC    Component Value Date/Time   WBC 6.0 05/30/2014 1318   RBC 2.94* 05/30/2014 1318   RBC 2.41* 01/13/2014 1000   HGB 10.4* 05/30/2014 1318   HCT 30.9* 05/30/2014 1318   PLT 180 05/30/2014 1318   MCV 105.1* 05/30/2014 1318   MCH 35.4* 05/30/2014 1318   MCHC 33.7 05/30/2014 1318   RDW 12.6 05/30/2014 1318   LYMPHSABS 1.2 05/30/2014 1318   MONOABS 0.4 05/30/2014 1318   EOSABS 1.4* 05/30/2014 1318   BASOSABS 0.0 05/30/2014 1318      Chemistry      Component Value Date/Time   NA 142 05/30/2014 1318   K 3.6* 05/30/2014 1318   CL 102 05/30/2014 1318   CO2 28 05/30/2014 1318   BUN 15 05/30/2014 1318   CREATININE 0.74 05/30/2014 1318      Component Value Date/Time   CALCIUM 9.3 05/30/2014 1318   ALKPHOS 130* 05/30/2014 1318   AST 16 05/30/2014 1318   ALT 14 05/30/2014 1318   BILITOT 0.4  05/30/2014 1318        ASSESSMENT:  1. 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 S/P left partial mastectomy on 07/03/2013 demonstrating a 1.4 cm invasive component that was ER 74%, PR 16%, Ki-67 52%, and HER2 negative. However, positive margins were noted and therefore on 07/17/2013 she underwent a left modified radical mastectomy and left axillary lymph node dissection. Clear margins were ascertained with 2/39 positive lymph nodes positive for metastatic disease. Testing on the positive lymph  node reveals HER2 positivity. OncoType DX score was 29 placing her in the high-end of the intermediate risk group for recurrence. She is S/P TCH therapy x 6 cycles and is now on Herceptin x 52 weeks. She is S/P radiation therapy and started Aromasin on 03/29/2014.  2. Tobacco abuse, 20 pack year smoking history  3. Pulmonary nodules, likely benign. Future surveillance is recommended per radiologist reading report.  4. Peripheral neuropathy, switched to Lacosamide on 05/30/2014 5. B/L LE edema, worsened with gabapentin  Patient Active Problem List   Diagnosis Date Noted  . Peripheral neuropathy due to chemotherapy 05/08/2014  . Unstable balance 05/08/2014  . Stiffness of joint, not elsewhere classified, ankle and foot 05/08/2014  . Ankle weakness 05/08/2014  . Invasive ductal carcinoma metastasized to axillary lymph node, left 08/09/2013  . Barrett esophagus 12/22/2011  . GERD (gastroesophageal reflux disease) 06/23/2011  . Hypertension 06/23/2011     PLAN:  1. I personally reviewed and went over laboratory results with the patient.  The results are noted within this dictation. 2. Pre-chemo labs every 3 weeks: CBC diff, CMET 3. Herceptin today as planned 4. Continue Lacosamide for peripheral neuropathy 5. Continue Aromasin 6. MUGA in December 2015 7. MRI of left foot today, ordered by Dr. Caprice Beaver 8. Return in 6 weeks for follow-up   THERAPY PLAN:  She will complete 52 weeks worth of Herceptin therapy and at least 5 years worth of aromatase inhibitor.  All questions were answered. The patient knows to call the clinic with any problems, questions or concerns. We can certainly see the patient much sooner if necessary.  Patient and plan discussed with Dr. Farrel Gobble and he is in agreement with the aforementioned.   KEFALAS,THOMAS 06/18/2014

## 2014-06-18 ENCOUNTER — Encounter (HOSPITAL_BASED_OUTPATIENT_CLINIC_OR_DEPARTMENT_OTHER): Payer: BC Managed Care – PPO | Admitting: Oncology

## 2014-06-18 ENCOUNTER — Ambulatory Visit (HOSPITAL_COMMUNITY)
Admission: RE | Admit: 2014-06-18 | Discharge: 2014-06-18 | Disposition: A | Discharge status: Home / Self Care | Payer: BC Managed Care – PPO | Source: Ambulatory Visit | Attending: Podiatry | Admitting: Podiatry

## 2014-06-18 ENCOUNTER — Other Ambulatory Visit (HOSPITAL_COMMUNITY): Payer: Self-pay | Admitting: General Surgery

## 2014-06-18 VITALS — BP 123/68 | HR 87 | Temp 98.9°F | Resp 18 | Wt 146.0 lb

## 2014-06-18 DIAGNOSIS — M25571 Pain in right ankle and joints of right foot: Secondary | ICD-10-CM | POA: Insufficient documentation

## 2014-06-18 DIAGNOSIS — M25471 Effusion, right ankle: Secondary | ICD-10-CM | POA: Diagnosis not present

## 2014-06-18 DIAGNOSIS — G609 Hereditary and idiopathic neuropathy, unspecified: Secondary | ICD-10-CM

## 2014-06-18 DIAGNOSIS — M659 Synovitis and tenosynovitis, unspecified: Secondary | ICD-10-CM | POA: Diagnosis not present

## 2014-06-18 DIAGNOSIS — C773 Secondary and unspecified malignant neoplasm of axilla and upper limb lymph nodes: Secondary | ICD-10-CM

## 2014-06-18 DIAGNOSIS — I10 Essential (primary) hypertension: Secondary | ICD-10-CM

## 2014-06-18 DIAGNOSIS — S96911A Strain of unspecified muscle and tendon at ankle and foot level, right foot, initial encounter: Secondary | ICD-10-CM

## 2014-06-18 DIAGNOSIS — Z1231 Encounter for screening mammogram for malignant neoplasm of breast: Secondary | ICD-10-CM

## 2014-06-18 DIAGNOSIS — C50912 Malignant neoplasm of unspecified site of left female breast: Secondary | ICD-10-CM

## 2014-06-18 NOTE — Patient Instructions (Signed)
[facility] Discharge Instructions  RECOMMENDATIONS MADE BY THE CONSULTANT AND ANY TEST RESULTS WILL BE SENT TO YOUR REFERRING PHYSICIAN.  MEDICATIONS PRESCRIBED:  None  INSTRUCTIONS GIVEN AND DISCUSSED: Continue with Herceptin and Aromasin  SPECIAL INSTRUCTIONS/FOLLOW-UP: Return in 6 weeks for follow-up  Thank you for choosing [facility] to provide your oncology and hematology care.  To afford each patient quality time with our providers, please arrive at least 15 minutes before your scheduled appointment time.  With your help, our goal is to use those 15 minutes to complete the necessary work-up to ensure our physicians have the information they need to help with your evaluation and healthcare recommendations.    Effective January 1st, 2014, we ask that you re-schedule your appointment with our physicians should you arrive 10 or more minutes late for your appointment.  We strive to give you quality time with our providers, and arriving late affects you and other patients whose appointments are after yours.    Again, thank you for choosing [facility].  Our hope is that these requests will decrease the amount of time that you wait before being seen by our physicians.       _____________________________________________________________  Should you have questions after your visit to [facility], please contact our office at [phone] between the hours of 8:30 a.m. and 5:00 p.m.  Voicemails left after 4:30 p.m. will not be returned until the following business day.  For prescription refill requests, have your pharmacy contact our office with your prescription refill request.

## 2014-06-23 ENCOUNTER — Encounter (HOSPITAL_BASED_OUTPATIENT_CLINIC_OR_DEPARTMENT_OTHER): Payer: BC Managed Care – PPO

## 2014-06-23 VITALS — BP 133/73 | HR 70 | Temp 98.3°F | Resp 20 | Wt 148.6 lb

## 2014-06-23 DIAGNOSIS — C50912 Malignant neoplasm of unspecified site of left female breast: Secondary | ICD-10-CM | POA: Diagnosis not present

## 2014-06-23 DIAGNOSIS — Z5112 Encounter for antineoplastic immunotherapy: Secondary | ICD-10-CM

## 2014-06-23 DIAGNOSIS — C773 Secondary and unspecified malignant neoplasm of axilla and upper limb lymph nodes: Secondary | ICD-10-CM

## 2014-06-23 LAB — CBC WITH DIFFERENTIAL/PLATELET
BASOS ABS: 0 10*3/uL (ref 0.0–0.1)
Basophils Relative: 1 % (ref 0–1)
EOS PCT: 5 % (ref 0–5)
Eosinophils Absolute: 0.2 10*3/uL (ref 0.0–0.7)
HCT: 31.5 % — ABNORMAL LOW (ref 36.0–46.0)
Hemoglobin: 10.7 g/dL — ABNORMAL LOW (ref 12.0–15.0)
LYMPHS PCT: 22 % (ref 12–46)
Lymphs Abs: 1 10*3/uL (ref 0.7–4.0)
MCH: 35.1 pg — ABNORMAL HIGH (ref 26.0–34.0)
MCHC: 34 g/dL (ref 30.0–36.0)
MCV: 103.3 fL — AB (ref 78.0–100.0)
Monocytes Absolute: 0.4 10*3/uL (ref 0.1–1.0)
Monocytes Relative: 8 % (ref 3–12)
Neutro Abs: 2.8 10*3/uL (ref 1.7–7.7)
Neutrophils Relative %: 64 % (ref 43–77)
Platelets: 203 10*3/uL (ref 150–400)
RBC: 3.05 MIL/uL — ABNORMAL LOW (ref 3.87–5.11)
RDW: 12.2 % (ref 11.5–15.5)
WBC: 4.4 10*3/uL (ref 4.0–10.5)

## 2014-06-23 LAB — COMPREHENSIVE METABOLIC PANEL
ALT: 13 U/L (ref 0–35)
AST: 15 U/L (ref 0–37)
Albumin: 4 g/dL (ref 3.5–5.2)
Alkaline Phosphatase: 109 U/L (ref 39–117)
Anion gap: 11 (ref 5–15)
BUN: 14 mg/dL (ref 6–23)
CALCIUM: 9.8 mg/dL (ref 8.4–10.5)
CO2: 28 mEq/L (ref 19–32)
Chloride: 101 mEq/L (ref 96–112)
Creatinine, Ser: 0.72 mg/dL (ref 0.50–1.10)
GFR calc Af Amer: >90 mL/min (ref >90)
GFR calc non Af Amer: >90 mL/min (ref >90)
Glucose, Bld: 106 mg/dL — ABNORMAL HIGH (ref 70–99)
Potassium: 3.4 mEq/L — ABNORMAL LOW (ref 3.7–5.3)
Sodium: 140 mEq/L (ref 137–147)
TOTAL PROTEIN: 7.1 g/dL (ref 6.0–8.3)
Total Bilirubin: 0.4 mg/dL (ref 0.3–1.2)

## 2014-06-23 MED ORDER — TRASTUZUMAB CHEMO INJECTION 440 MG
6.0000 mg/kg | Freq: Once | INTRAVENOUS | Status: AC
  Administered 2014-06-23: 420 mg via INTRAVENOUS
  Filled 2014-06-23: qty 20

## 2014-06-23 MED ORDER — DIPHENHYDRAMINE HCL 25 MG PO CAPS
50.0000 mg | ORAL_CAPSULE | Freq: Once | ORAL | Status: AC
  Administered 2014-06-23: 50 mg via ORAL
  Filled 2014-06-23: qty 2

## 2014-06-23 MED ORDER — ACETAMINOPHEN 325 MG PO TABS
650.0000 mg | ORAL_TABLET | Freq: Once | ORAL | Status: AC
  Administered 2014-06-23: 650 mg via ORAL
  Filled 2014-06-23: qty 2

## 2014-06-23 MED ORDER — HEPARIN SOD (PORK) LOCK FLUSH 100 UNIT/ML IV SOLN
500.0000 [IU] | Freq: Once | INTRAVENOUS | Status: AC | PRN
  Administered 2014-06-23: 500 [IU]
  Filled 2014-06-23: qty 5

## 2014-06-23 MED ORDER — SODIUM CHLORIDE 0.9 % IV SOLN
Freq: Once | INTRAVENOUS | Status: AC
  Administered 2014-06-23: 12:00:00 via INTRAVENOUS

## 2014-06-23 MED ORDER — SODIUM CHLORIDE 0.9 % IJ SOLN: 10.0000 mL | INTRAMUSCULAR | Status: DC | PRN

## 2014-06-23 NOTE — Patient Instructions (Signed)
[facility] Discharge Instructions for Patients Receiving Chemotherapy  Today you received the following: Herceptin.   Follow up as scheduled.  Please call for any questions or concerns.     If you develop nausea and vomiting, or diarrhea that is not controlled by your medication, call the clinic.  The clinic phone number is [phone]. Office hours are Monday-Friday 8:30am-5:00pm.  BELOW ARE SYMPTOMS THAT SHOULD BE REPORTED IMMEDIATELY:  *FEVER GREATER THAN 101.0 F  *CHILLS WITH OR WITHOUT FEVER  NAUSEA AND VOMITING THAT IS NOT CONTROLLED WITH YOUR NAUSEA MEDICATION  *UNUSUAL SHORTNESS OF BREATH  *UNUSUAL BRUISING OR BLEEDING  TENDERNESS IN MOUTH AND THROAT WITH OR WITHOUT PRESENCE OF ULCERS  *URINARY PROBLEMS  *BOWEL PROBLEMS  UNUSUAL RASH Items with * indicate a potential emergency and should be followed up as soon as possible. If you have an emergency after office hours please contact your primary care physician or go to the nearest emergency department.  Please call the clinic during office hours if you have any questions or concerns.   You may also contact the Patient Navigator at [phone] should you have any questions or need assistance in obtaining follow up care. _____________________________________________________________________ Have you asked about our STAR program?    STAR stands for Survivorship Training and Rehabilitation, and this is a nationally recognized cancer care program that focuses on survivorship and rehabilitation.  Cancer and cancer treatments may cause problems, such as, pain, making you feel tired and keeping you from doing the things that you need or want to do. Cancer rehabilitation can help. Our goal is to reduce these troubling effects and help you have the best quality of life possible.  You may receive a survey from a nurse that asks questions about your current state of health.  Based on the survey results, all  eligible patients will be referred to the [facility] program for an evaluation so we can better serve you! A frequently asked questions sheet is available upon request.

## 2014-06-23 NOTE — Progress Notes (Signed)
Patient tolerated infusion well.

## 2014-06-24 ENCOUNTER — Other Ambulatory Visit (HOSPITAL_COMMUNITY): Payer: Self-pay | Admitting: Oncology

## 2014-06-24 DIAGNOSIS — E876 Hypokalemia: Secondary | ICD-10-CM

## 2014-06-24 MED ORDER — POTASSIUM CHLORIDE CRYS ER 20 MEQ PO TBCR: 20.0000 meq | EXTENDED_RELEASE_TABLET | Freq: Two times a day (BID) | ORAL | Status: DC

## 2014-06-27 ENCOUNTER — Ambulatory Visit (HOSPITAL_COMMUNITY)
Admission: RE | Admit: 2014-06-27 | Payer: BC Managed Care – PPO | Source: Ambulatory Visit | Attending: Physical Therapy | Admitting: Physical Therapy

## 2014-07-02 ENCOUNTER — Ambulatory Visit (HOSPITAL_COMMUNITY): Payer: BC Managed Care – PPO

## 2014-07-03 ENCOUNTER — Ambulatory Visit (HOSPITAL_COMMUNITY): Payer: BC Managed Care – PPO | Admitting: Physical Therapy

## 2014-07-07 ENCOUNTER — Ambulatory Visit (HOSPITAL_COMMUNITY)
Admission: RE | Admit: 2014-07-07 | Discharge: 2014-07-07 | Disposition: A | Discharge status: Home / Self Care | Payer: BC Managed Care – PPO | Source: Ambulatory Visit | Attending: General Surgery | Admitting: General Surgery

## 2014-07-07 ENCOUNTER — Ambulatory Visit (HOSPITAL_COMMUNITY): Payer: BC Managed Care – PPO | Attending: Oncology

## 2014-07-07 DIAGNOSIS — Z1231 Encounter for screening mammogram for malignant neoplasm of breast: Secondary | ICD-10-CM | POA: Diagnosis present

## 2014-07-07 DIAGNOSIS — G629 Polyneuropathy, unspecified: Secondary | ICD-10-CM | POA: Insufficient documentation

## 2014-07-08 ENCOUNTER — Ambulatory Visit (HOSPITAL_COMMUNITY): Payer: BC Managed Care – PPO | Admitting: Physical Therapy

## 2014-07-16 ENCOUNTER — Encounter (HOSPITAL_COMMUNITY): Payer: Self-pay

## 2014-07-16 ENCOUNTER — Ambulatory Visit (HOSPITAL_COMMUNITY)
Admission: RE | Admit: 2014-07-16 | Payer: BC Managed Care – PPO | Source: Ambulatory Visit | Attending: Physical Therapy | Admitting: Physical Therapy

## 2014-07-16 ENCOUNTER — Encounter (HOSPITAL_COMMUNITY): Payer: BC Managed Care – PPO | Attending: Hematology and Oncology

## 2014-07-16 DIAGNOSIS — C50912 Malignant neoplasm of unspecified site of left female breast: Secondary | ICD-10-CM | POA: Diagnosis not present

## 2014-07-16 DIAGNOSIS — C773 Secondary and unspecified malignant neoplasm of axilla and upper limb lymph nodes: Secondary | ICD-10-CM | POA: Diagnosis present

## 2014-07-16 DIAGNOSIS — C50919 Malignant neoplasm of unspecified site of unspecified female breast: Secondary | ICD-10-CM | POA: Diagnosis present

## 2014-07-16 DIAGNOSIS — Z5112 Encounter for antineoplastic immunotherapy: Secondary | ICD-10-CM

## 2014-07-16 LAB — CBC WITH DIFFERENTIAL/PLATELET
BASOS ABS: 0 10*3/uL (ref 0.0–0.1)
Basophils Relative: 1 % (ref 0–1)
Eosinophils Absolute: 0.1 10*3/uL (ref 0.0–0.7)
Eosinophils Relative: 3 % (ref 0–5)
HEMATOCRIT: 33.8 % — AB (ref 36.0–46.0)
Hemoglobin: 11.5 g/dL — ABNORMAL LOW (ref 12.0–15.0)
LYMPHS ABS: 1.2 10*3/uL (ref 0.7–4.0)
Lymphocytes Relative: 29 % (ref 12–46)
MCH: 35.2 pg — ABNORMAL HIGH (ref 26.0–34.0)
MCHC: 34 g/dL (ref 30.0–36.0)
MCV: 103.4 fL — ABNORMAL HIGH (ref 78.0–100.0)
Monocytes Absolute: 0.2 10*3/uL (ref 0.1–1.0)
Monocytes Relative: 5 % (ref 3–12)
NEUTROS ABS: 2.7 10*3/uL (ref 1.7–7.7)
Neutrophils Relative %: 62 % (ref 43–77)
PLATELETS: 213 10*3/uL (ref 150–400)
RBC: 3.27 MIL/uL — ABNORMAL LOW (ref 3.87–5.11)
RDW: 12.2 % (ref 11.5–15.5)
WBC: 4.3 10*3/uL (ref 4.0–10.5)

## 2014-07-16 LAB — COMPREHENSIVE METABOLIC PANEL
ALK PHOS: 103 U/L (ref 39–117)
ALT: 15 U/L (ref 0–35)
AST: 17 U/L (ref 0–37)
Albumin: 4 g/dL (ref 3.5–5.2)
Anion gap: 12 (ref 5–15)
BUN: 12 mg/dL (ref 6–23)
CHLORIDE: 103 meq/L (ref 96–112)
CO2: 27 mEq/L (ref 19–32)
Calcium: 9.6 mg/dL (ref 8.4–10.5)
Creatinine, Ser: 0.91 mg/dL (ref 0.50–1.10)
GFR calc Af Amer: 78 mL/min — ABNORMAL LOW (ref >90)
GFR calc non Af Amer: 68 mL/min — ABNORMAL LOW (ref >90)
GLUCOSE: 118 mg/dL — AB (ref 70–99)
POTASSIUM: 3.8 meq/L (ref 3.7–5.3)
Sodium: 142 mEq/L (ref 137–147)
Total Bilirubin: 0.4 mg/dL (ref 0.3–1.2)
Total Protein: 7 g/dL (ref 6.0–8.3)

## 2014-07-16 MED ORDER — TRASTUZUMAB CHEMO INJECTION 440 MG
6.0000 mg/kg | Freq: Once | INTRAVENOUS | Status: AC
  Administered 2014-07-16: 420 mg via INTRAVENOUS
  Filled 2014-07-16: qty 20

## 2014-07-16 MED ORDER — DIPHENHYDRAMINE HCL 25 MG PO CAPS
50.0000 mg | ORAL_CAPSULE | Freq: Once | ORAL | Status: AC
  Administered 2014-07-16: 50 mg via ORAL
  Filled 2014-07-16: qty 2

## 2014-07-16 MED ORDER — HEPARIN SOD (PORK) LOCK FLUSH 100 UNIT/ML IV SOLN
500.0000 [IU] | Freq: Once | INTRAVENOUS | Status: AC | PRN
  Administered 2014-07-16: 500 [IU]
  Filled 2014-07-16: qty 5

## 2014-07-16 MED ORDER — SODIUM CHLORIDE 0.9 % IJ SOLN: 10.0000 mL | INTRAMUSCULAR | Status: DC | PRN

## 2014-07-16 MED ORDER — ACETAMINOPHEN 325 MG PO TABS
650.0000 mg | ORAL_TABLET | Freq: Once | ORAL | Status: AC
  Administered 2014-07-16: 650 mg via ORAL
  Filled 2014-07-16: qty 2

## 2014-07-16 MED ORDER — SODIUM CHLORIDE 0.9 % IV SOLN
Freq: Once | INTRAVENOUS | Status: AC
  Administered 2014-07-16: 11:00:00 via INTRAVENOUS

## 2014-07-16 NOTE — Progress Notes (Signed)
Sherral Dirocco Khurana  tolerated chemotherapy well today.  Discharged ambulatory

## 2014-07-16 NOTE — Patient Instructions (Signed)
[facility] Discharge Instructions for Patients Receiving Chemotherapy  Today you received the following chemotherapy agents herceptin Please call the clinic if you have any questions or concerns  To help prevent nausea and vomiting after your treatment, we encourage you to take your nausea medication    If you develop nausea and vomiting that is not controlled by your nausea medication, call the clinic. If it is after clinic hours your family physician or the after hours number for the clinic or go to the Emergency Department.   BELOW ARE SYMPTOMS THAT SHOULD BE REPORTED IMMEDIATELY:  *FEVER GREATER THAN 101.0 F  *CHILLS WITH OR WITHOUT FEVER  NAUSEA AND VOMITING THAT IS NOT CONTROLLED WITH YOUR NAUSEA MEDICATION  *UNUSUAL SHORTNESS OF BREATH  *UNUSUAL BRUISING OR BLEEDING  TENDERNESS IN MOUTH AND THROAT WITH OR WITHOUT PRESENCE OF ULCERS  *URINARY PROBLEMS  *BOWEL PROBLEMS  UNUSUAL RASH Items with * indicate a potential emergency and should be followed up as soon as possible.  One of the nurses will contact you 24 hours after your treatment. Please let the nurse know about any problems that you may have experienced. Feel free to call the clinic you have any questions or concerns. The clinic phone number is [phone].   I have been informed and understand all the instructions given to me. I know to contact the clinic, my physician, or go to the Emergency Department if any problems should occur. I do not have any questions at this time, but understand that I may call the clinic during office hours or the Patient Navigator at [phone] should I have any questions or need assistance in obtaining follow up care.   Trastuzumab injection for infusion What is this medicine? TRASTUZUMAB (tras TOO zoo mab) is a monoclonal antibody. It targets a protein called HER2. This protein is found in some stomach and breast cancers. This medicine can stop cancer cell  growth. This medicine may be used with other cancer treatments. This medicine may be used for other purposes; ask your health care provider or pharmacist if you have questions. COMMON BRAND NAME(S): Herceptin What should I tell my health care provider before I take this medicine? They need to know if you have any of these conditions: -heart disease -heart failure -infection (especially a virus infection such as chickenpox, cold sores, or herpes) -lung or breathing disease, like asthma -recent or ongoing radiation therapy -an unusual or allergic reaction to trastuzumab, benzyl alcohol, or other medications, foods, dyes, or preservatives -pregnant or trying to get pregnant -breast-feeding How should I use this medicine? This drug is given as an infusion into a vein. It is administered in a hospital or clinic by a specially trained health care professional. Talk to your pediatrician regarding the use of this medicine in children. This medicine is not approved for use in children. Overdosage: If you think you have taken too much of this medicine contact a poison control center or emergency room at once. NOTE: This medicine is only for you. Do not share this medicine with others. What if I miss a dose? It is important not to miss a dose. Call your doctor or health care professional if you are unable to keep an appointment. What may interact with this medicine? -cyclophosphamide -doxorubicin -warfarin This list may not describe all possible interactions. Give your health care provider a list of all the medicines, herbs, non-prescription drugs, or dietary supplements you use. Also tell them if you smoke, drink alcohol,   or use illegal drugs. Some items may interact with your medicine. What should I watch for while using this medicine? Visit your doctor for checks on your progress. Report any side effects. Continue your course of treatment even though you feel ill unless your doctor tells you to  stop. Call your doctor or health care professional for advice if you get a fever, chills or sore throat, or other symptoms of a cold or flu. Do not treat yourself. Try to avoid being around people who are sick. You may experience fever, chills and shaking during your first infusion. These effects are usually mild and can be treated with other medicines. Report any side effects during the infusion to your health care professional. Fever and chills usually do not happen with later infusions. What side effects may I notice from receiving this medicine? Side effects that you should report to your doctor or other health care professional as soon as possible: -breathing difficulties -chest pain or palpitations -cough -dizziness or fainting -fever or chills, sore throat -skin rash, itching or hives -swelling of the legs or ankles -unusually weak or tired Side effects that usually do not require medical attention (report to your doctor or other health care professional if they continue or are bothersome): -loss of appetite -headache -muscle aches -nausea This list may not describe all possible side effects. Call your doctor for medical advice about side effects. You may report side effects to FDA at 1-800-FDA-1088. Where should I keep my medicine? This drug is given in a hospital or clinic and will not be stored at home. NOTE: This sheet is a summary. It may not cover all possible information. If you have questions about this medicine, talk to your doctor, pharmacist, or health care provider.  2015, [employer]/Gold Standard. (2009-06-19 13:43:15)     

## 2014-07-17 ENCOUNTER — Ambulatory Visit (HOSPITAL_COMMUNITY): Payer: BC Managed Care – PPO

## 2014-07-25 ENCOUNTER — Other Ambulatory Visit (HOSPITAL_COMMUNITY): Payer: Self-pay | Admitting: Oncology

## 2014-07-27 NOTE — Progress Notes (Signed)
[place], [name] [address]  Breast cancer metastasized to axillary lymph node, left  Peripheral neuropathy due to chemotherapy - Plan: Lacosamide 100 MG TABS  CURRENT THERAPY: Started Aromasin on 03/29/2014. Completing 52 weeks worth of Herceptin therapy finishing in December 2015.   INTERVAL HISTORY: Heather Vaughan 58 y.o. female returns for  regular  visit for followup of Invasive ductal carcinoma of left breast with high grade DCIS. S/P left partial mastectomy on 07/03/2013 demonstrating a 1.4 cm invasive component that was ER 74%, PR 16%, Ki-67 52%, and HER2 negative. However, positive margins were noted and therefore on 07/17/2013 she underwent a left modified radical mastectomy and left axillary lymph node dissection. Clear margins were ascertained with 6/96 positive lymph nodes positive for metastatic disease. Testing on the positive lymph node reveals HER2 positivity. OncoType DX score was 29 placing her in the high-end of the intermediate risk group for recurrence. She was therefore started on [facility] therapy on 08/27/2013 and finished TC on 12/18/2013 after 6 cycles. She is S/P radiation therapy by Dr. Pablo Ledger on 01/27/2014- 03/14/2014. She is now completing 52 weeks worth of Herceptin therapy and Aromasin (beginning on on 03/29/2014).      Invasive ductal carcinoma metastasized to axillary lymph node, left   05/28/2013 Imaging Screening mammogram   06/19/2013 Imaging Left diagnostic mammogram   06/25/2013 Imaging CT CAP- borderline enlarged axillary lymph node on left.  Small pulmonary nodules noted but too small for PET or biopsy; surveillance recommended.    07/03/2013 Surgery Left partial mastectomy.  Invasive ductal carcinoma. 1.4 cm.  POSITIVE MARGINS.  Additional extensive DCIS noted.  ER 74%, PR 16%, Ki-67 52%, HER2 NEGATIVE.   07/17/2013 Surgery Left modified radiacl mastectomy with left axillary node dissection.  Negative residual cancer.   Negative inked margins.  1/12 positive lymph nodes.  FINAL MARGINS NEGATIVE.  LYMPH NODE IS HER2 POSITIVE.   08/13/2013 Imaging MUGA scan shows left ventricular EF of 62%   08/27/2013 - 12/18/2013 Chemotherapy TCH x 6 cycles   01/07/2014 -  Chemotherapy Herceptin x 52 weeks   01/27/2014 - 03/13/2014 Radiation Therapy By Dr. Pablo Ledger   03/29/2014 -  Chemotherapy Aromasin to start on 03/29/2014   I personally reviewed and went over laboratory results with the patient.  The results are noted within this dictation.  She is tolerating all therapy well without any complaints.   I personally reviewed and went over radiographic studies with the patient.  The results are noted within this dictation.  Mammogram performed recently is negative.  She wonders about surveillance on her mastectomy side and she is educated that there is not surveillance testing that can be performed locally on that side.  However, annual mammogram and regular examinations will be able to keep tabs on that side and identify any gross changes.    She has seen her podiatrist to follow-up on her MRI findings.  She reports that she received a cortisone injection by Dr. Caprice Beaver for her inflammation of the right foot.  She admits that it was a very painful procedure.  She notes that it is a little better.   She admits that her peripheral neuropathy is improved some and she is encouraged to continue Lacosamide for peripheral neuropathy.  Oncologically, she denies any complaints and ROS questioning is negative.    Past Medical History  Diagnosis Date  . GERD (gastroesophageal reflux disease)   . Hypertension   . Bronchitis 07/27/2011  . Barrett's esophagus   .  Anxiety   . Breast cancer, left breast     has GERD (gastroesophageal reflux disease); Hypertension; Barrett esophagus; Invasive ductal carcinoma metastasized to axillary lymph node, left; Peripheral neuropathy due to chemotherapy; Unstable balance; Stiffness of joint, not  elsewhere classified, ankle and foot; and Ankle weakness on her problem list.     has No Known Allergies.  Ms. Whipple had no medications administered during this visit.  Past Surgical History  Procedure Laterality Date  . Excision morton's neuroma  07/14/2011    Procedure: EXCISION MORTON'S NEUROMA;  Surgeon: Marcheta Grammes;  Location: [facility];  Service: Orthopedics;  Laterality: Right;  Removal Neuroma 3rd Interspace Right Foot  . Esophagogastroduodenoscopy  08/03/2011    Procedure: ESOPHAGOGASTRODUODENOSCOPY (EGD);  Surgeon: Rogene Houston, MD;  Location: [facility];  Service: Endoscopy;  Laterality: N/A;  12:45  . Partial mastectomy with needle localization Left 07/03/2013    Procedure: PARTIAL MASTECTOMY STATUS POST NEEDLE LOCALIZATION;  Surgeon: Scherry Ran, MD;  Location: [facility];  Service: General;  Laterality: Left;  . Breast biopsy Bilateral     X 4- benign  . Mastectomy modified radical Left 07/17/2013    Procedure: MASTECTOMY MODIFIED RADICAL WITH LEFT AXILLARY TISSUE REMOVAL;  Surgeon: Scherry Ran, MD;  Location: [facility];  Service: General;  Laterality: Left;  . Portacath placement Right 08/16/2013    Procedure: INSERTION PORT-A-CATH RIGHT SUBCLAVIAN;  Surgeon: Scherry Ran, MD;  Location: [facility];  Service: General;  Laterality: Right;    Denies any headaches, dizziness, double vision, fevers, chills, night sweats, nausea, vomiting, diarrhea, constipation, chest pain, heart palpitations, shortness of breath, blood in stool, black tarry stool, urinary pain, urinary burning, urinary frequency, hematuria.   PHYSICAL EXAMINATION  ECOG PERFORMANCE STATUS: 1 - Symptomatic but completely ambulatory  Filed Vitals:   07/30/14 1445  BP: 128/66  Pulse: 75  Temp: 98.9 F (37.2 C)  Resp: 18    GENERAL:alert, no distress, well nourished, well developed, comfortable, cooperative and smiling SKIN: skin color, texture, turgor are normal, no rashes or  significant lesions HEAD: Normocephalic, No masses, lesions, tenderness or abnormalities EYES: normal, PERRLA, EOMI, Conjunctiva are pink and non-injected EARS: External ears normal OROPHARYNX:lips, buccal mucosa, and tongue normal and mucous membranes are moist  NECK: supple, trachea midline LYMPH:  not examined BREAST:not examined LUNGS: not examined HEART: not examined ABDOMEN:abdomen soft and normal bowel sounds BACK: Back symmetric, no curvature. EXTREMITIES:less then 2 second capillary refill, no edema, no clubbing, no cyanosis  NEURO: alert & oriented x 3 with fluent speech, no focal motor/sensory deficits, gait normal    LABORATORY DATA: CBC    Component Value Date/Time   WBC 4.3 07/16/2014 1100   RBC 3.27* 07/16/2014 1100   RBC 2.41* 01/13/2014 1000   HGB 11.5* 07/16/2014 1100   HCT 33.8* 07/16/2014 1100   PLT 213 07/16/2014 1100   MCV 103.4* 07/16/2014 1100   MCH 35.2* 07/16/2014 1100   MCHC 34.0 07/16/2014 1100   RDW 12.2 07/16/2014 1100   LYMPHSABS 1.2 07/16/2014 1100   MONOABS 0.2 07/16/2014 1100   EOSABS 0.1 07/16/2014 1100   BASOSABS 0.0 07/16/2014 1100      Chemistry      Component Value Date/Time   NA 142 07/16/2014 1100   K 3.8 07/16/2014 1100   CL 103 07/16/2014 1100   CO2 27 07/16/2014 1100   BUN 12 07/16/2014 1100   CREATININE 0.91 07/16/2014 1100      Component Value Date/Time  CALCIUM 9.6 07/16/2014 1100   ALKPHOS 103 07/16/2014 1100   AST 17 07/16/2014 1100   ALT 15 07/16/2014 1100   BILITOT 0.4 07/16/2014 1100      RADIOGRAPHIC STUDIES:  07/08/2014  CLINICAL DATA: Screening.  EXAM: DIGITAL SCREENING UNILATERAL RIGHT MAMMOGRAM WITH CAD  COMPARISON: Previous exam(s).  ACR Breast Density Category c: The breast tissue is heterogeneously dense, which may obscure small masses.  FINDINGS: There are no findings suspicious for malignancy. Images were processed with CAD.  IMPRESSION: No mammographic evidence of  malignancy. A result letter of this screening mammogram will be mailed directly to the patient.  RECOMMENDATION: Screening mammogram in one year. (Code:SM-B-01Y)  BI-RADS CATEGORY 1: Negative.   Electronically Signed  By: Conchita Paris M.D.  On: 07/08/2014 08:16    ASSESSMENT:  1. 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 S/P left partial mastectomy on 07/03/2013 demonstrating a 1.4 cm invasive component that was ER 74%, PR 16%, Ki-67 52%, and HER2 negative. However, positive margins were noted and therefore on 07/17/2013 she underwent a left modified radical mastectomy and left axillary lymph node dissection. Clear margins were ascertained with 6/54 positive lymph nodes positive for metastatic disease. Testing on the positive lymph node reveals HER2 positivity. OncoType DX score was 29 placing her in the high-end of the intermediate risk group for recurrence. She is S/P TCH therapy x 6 cycles and is now on Herceptin x 52 weeks. She is S/P radiation therapy and started Aromasin on 03/29/2014.  2. Tobacco abuse, 20 pack year smoking history  3. Pulmonary nodules, likely benign. Future surveillance is recommended per radiologist reading report.  4. Peripheral neuropathy, switched to Lacosamide on 05/30/2014 and patient reported improvement on office visit on 07/30/2014 5. B/L LE edema, worsened with gabapentin, resolved with discontinuation.  Patient Active Problem List   Diagnosis Date Noted  . Peripheral neuropathy due to chemotherapy 05/08/2014  . Unstable balance 05/08/2014  . Stiffness of joint, not elsewhere classified, ankle and foot 05/08/2014  . Ankle weakness 05/08/2014  . Invasive ductal carcinoma metastasized to axillary lymph node, left 08/09/2013  . Barrett esophagus 12/22/2011  . GERD (gastroesophageal reflux disease) 06/23/2011  . Hypertension 06/23/2011     PLAN:  1. I personally reviewed and went over laboratory results with the patient.   The results are noted within this dictation. 2. I personally reviewed and went over radiographic studies with the patient.  The results are noted within this dictation.   3. Herceptin on 12/11 as planned.   4. MUGA scan on 12/7 as ordered and then in 3 months for final testing. 5. Return in 3 months for follow-up   THERAPY PLAN:  NCCN guidelines recommends the following surveillance for invasive breast cancer:  A. History and Physical exam every 4-6 months for 5 years and then every 12 months.  B. Mammography every 12 months  C. Women on Tamoxifen: annual gynecologic assessment every 12 months if uterus is present.  D. Women on aromatase inhibitor or who experience ovarian failure secondary to treatment should have monitoring of bone health with a bone mineral density determination at baseline and periodically thereafter.  E. Assess and encourage adherence to adjuvant endocrine therapy.  F. Evidence suggests that active lifestyle and achieving and maintaining an ideal body weight (20-25 BMI) may lead to optimal breast cancer outcomes.   All questions were answered. The patient knows to call the clinic with any problems, questions or concerns. We can certainly see the patient  much sooner if necessary.  Patient and plan discussed with Dr. Farrel Gobble and he is in agreement with the aforementioned.   KEFALAS,THOMAS 07/30/2014

## 2014-07-30 ENCOUNTER — Encounter (HOSPITAL_COMMUNITY): Payer: Self-pay | Admitting: Hematology and Oncology

## 2014-07-30 ENCOUNTER — Encounter (HOSPITAL_COMMUNITY): Payer: BC Managed Care – PPO | Attending: Hematology and Oncology | Admitting: Oncology

## 2014-07-30 ENCOUNTER — Encounter (HOSPITAL_COMMUNITY): Payer: Self-pay | Admitting: Oncology

## 2014-07-30 VITALS — BP 128/66 | HR 75 | Temp 98.9°F | Resp 18 | Wt 139.0 lb

## 2014-07-30 DIAGNOSIS — Z170 Estrogen receptor positive status [ER+]: Secondary | ICD-10-CM

## 2014-07-30 DIAGNOSIS — T451X5A Adverse effect of antineoplastic and immunosuppressive drugs, initial encounter: Secondary | ICD-10-CM

## 2014-07-30 DIAGNOSIS — C773 Secondary and unspecified malignant neoplasm of axilla and upper limb lymph nodes: Secondary | ICD-10-CM | POA: Insufficient documentation

## 2014-07-30 DIAGNOSIS — C50912 Malignant neoplasm of unspecified site of left female breast: Secondary | ICD-10-CM

## 2014-07-30 DIAGNOSIS — G620 Drug-induced polyneuropathy: Secondary | ICD-10-CM

## 2014-07-30 DIAGNOSIS — C50919 Malignant neoplasm of unspecified site of unspecified female breast: Secondary | ICD-10-CM | POA: Insufficient documentation

## 2014-07-30 MED ORDER — LACOSAMIDE 100 MG PO TABS: 100.0000 mg | ORAL_TABLET | Freq: Two times a day (BID) | ORAL | Status: DC

## 2014-07-30 NOTE — Patient Instructions (Signed)
[place] Discharge Instructions  RECOMMENDATIONS MADE BY THE CONSULTANT AND ANY TEST RESULTS WILL BE SENT TO YOUR REFERRING PHYSICIAN.  Continue Herceptin as planned.  Your next treatment is #16 of #17.  You will finish in December 2015. MUGA scan as scheduled on 12/7.  After this, we will do one more in 3 months. Labs in 3 months Return in 3 months for follow-up Continue taking Aromasin daily. Follow-up with primary care as directed  Merry Christmas  Thank you for choosing [place] to provide your oncology and hematology care.  To afford each patient quality time with our providers, please arrive at least 15 minutes before your scheduled appointment time.  With your help, our goal is to use those 15 minutes to complete the necessary work-up to ensure our physicians have the information they need to help with your evaluation and healthcare recommendations.    Effective January 1st, 2014, we ask that you re-schedule your appointment with our physicians should you arrive 10 or more minutes late for your appointment.  We strive to give you quality time with our providers, and arriving late affects you and other patients whose appointments are after yours.    Again, thank you for choosing [facility].  Our hope is that these requests will decrease the amount of time that you wait before being seen by our physicians.       _____________________________________________________________  Should you have questions after your visit to [facility], please contact our office at [phone] between the hours of 8:30 a.m. and 5:00 p.m.  Voicemails left after 4:30 p.m. will not be returned until the following business day.  For prescription refill requests, have your pharmacy contact our office with your prescription refill request.

## 2014-08-04 ENCOUNTER — Encounter (INDEPENDENT_AMBULATORY_CARE_PROVIDER_SITE_OTHER): Payer: Self-pay | Admitting: *Deleted

## 2014-08-04 ENCOUNTER — Encounter (HOSPITAL_COMMUNITY)
Admission: RE | Admit: 2014-08-04 | Discharge: 2014-08-04 | Disposition: A | Discharge status: Home / Self Care | Payer: BC Managed Care – PPO | Source: Ambulatory Visit | Attending: Oncology | Admitting: Oncology

## 2014-08-04 DIAGNOSIS — C773 Secondary and unspecified malignant neoplasm of axilla and upper limb lymph nodes: Secondary | ICD-10-CM | POA: Insufficient documentation

## 2014-08-04 DIAGNOSIS — C50912 Malignant neoplasm of unspecified site of left female breast: Secondary | ICD-10-CM | POA: Insufficient documentation

## 2014-08-04 MED ORDER — HEPARIN SOD (PORK) LOCK FLUSH 100 UNIT/ML IV SOLN
INTRAVENOUS | Status: AC
  Filled 2014-08-04: qty 5

## 2014-08-05 ENCOUNTER — Encounter (HOSPITAL_COMMUNITY)
Admission: RE | Admit: 2014-08-05 | Discharge: 2014-08-05 | Disposition: A | Discharge status: Home / Self Care | Payer: BC Managed Care – PPO | Source: Ambulatory Visit | Attending: Oncology | Admitting: Oncology

## 2014-08-05 ENCOUNTER — Encounter (HOSPITAL_COMMUNITY): Payer: Self-pay

## 2014-08-05 DIAGNOSIS — C50912 Malignant neoplasm of unspecified site of left female breast: Secondary | ICD-10-CM | POA: Insufficient documentation

## 2014-08-05 DIAGNOSIS — C773 Secondary and unspecified malignant neoplasm of axilla and upper limb lymph nodes: Secondary | ICD-10-CM | POA: Diagnosis not present

## 2014-08-05 MED ORDER — SODIUM CHLORIDE 0.9 % IJ SOLN
INTRAMUSCULAR | Status: AC
  Filled 2014-08-05: qty 18

## 2014-08-05 MED ORDER — TECHNETIUM TC 99M-LABELED RED BLOOD CELLS IV KIT
25.0000 | PACK | Freq: Once | INTRAVENOUS | Status: AC | PRN
  Administered 2014-08-05: 26 via INTRAVENOUS

## 2014-08-05 MED ORDER — HEPARIN SOD (PORK) LOCK FLUSH 100 UNIT/ML IV SOLN
INTRAVENOUS | Status: AC
  Filled 2014-08-05: qty 5

## 2014-08-07 NOTE — Progress Notes (Signed)
Will discuss lab results at appointment 08/08/14

## 2014-08-08 ENCOUNTER — Encounter (HOSPITAL_BASED_OUTPATIENT_CLINIC_OR_DEPARTMENT_OTHER): Payer: BC Managed Care – PPO

## 2014-08-08 ENCOUNTER — Other Ambulatory Visit (HOSPITAL_COMMUNITY): Payer: Self-pay | Admitting: Oncology

## 2014-08-08 ENCOUNTER — Encounter (HOSPITAL_COMMUNITY): Payer: Self-pay

## 2014-08-08 DIAGNOSIS — E876 Hypokalemia: Secondary | ICD-10-CM

## 2014-08-08 DIAGNOSIS — Z5112 Encounter for antineoplastic immunotherapy: Secondary | ICD-10-CM

## 2014-08-08 DIAGNOSIS — C50919 Malignant neoplasm of unspecified site of unspecified female breast: Secondary | ICD-10-CM | POA: Diagnosis present

## 2014-08-08 DIAGNOSIS — C773 Secondary and unspecified malignant neoplasm of axilla and upper limb lymph nodes: Secondary | ICD-10-CM | POA: Diagnosis present

## 2014-08-08 DIAGNOSIS — C50912 Malignant neoplasm of unspecified site of left female breast: Secondary | ICD-10-CM

## 2014-08-08 LAB — CBC WITH DIFFERENTIAL/PLATELET
BASOS PCT: 1 % (ref 0–1)
Basophils Absolute: 0 10*3/uL (ref 0.0–0.1)
EOS ABS: 0.2 10*3/uL (ref 0.0–0.7)
EOS PCT: 5 % (ref 0–5)
HEMATOCRIT: 32.7 % — AB (ref 36.0–46.0)
HEMOGLOBIN: 11.2 g/dL — AB (ref 12.0–15.0)
LYMPHS ABS: 1.2 10*3/uL (ref 0.7–4.0)
Lymphocytes Relative: 31 % (ref 12–46)
MCH: 35.4 pg — ABNORMAL HIGH (ref 26.0–34.0)
MCHC: 34.3 g/dL (ref 30.0–36.0)
MCV: 103.5 fL — AB (ref 78.0–100.0)
MONO ABS: 0.3 10*3/uL (ref 0.1–1.0)
Monocytes Relative: 8 % (ref 3–12)
Neutro Abs: 2.2 10*3/uL (ref 1.7–7.7)
Neutrophils Relative %: 55 % (ref 43–77)
Platelets: 167 10*3/uL (ref 150–400)
RBC: 3.16 MIL/uL — AB (ref 3.87–5.11)
RDW: 12 % (ref 11.5–15.5)
WBC: 4 10*3/uL (ref 4.0–10.5)

## 2014-08-08 LAB — COMPREHENSIVE METABOLIC PANEL
ALBUMIN: 3.9 g/dL (ref 3.5–5.2)
ALK PHOS: 83 U/L (ref 39–117)
ALT: 11 U/L (ref 0–35)
ANION GAP: 12 (ref 5–15)
AST: 13 U/L (ref 0–37)
BUN: 11 mg/dL (ref 6–23)
CO2: 27 mEq/L (ref 19–32)
CREATININE: 0.73 mg/dL (ref 0.50–1.10)
Calcium: 9.5 mg/dL (ref 8.4–10.5)
Chloride: 103 mEq/L (ref 96–112)
GFR calc Af Amer: >90 mL/min (ref >90)
GFR calc non Af Amer: >90 mL/min (ref >90)
Glucose, Bld: 89 mg/dL (ref 70–99)
POTASSIUM: 3.2 meq/L — AB (ref 3.7–5.3)
Sodium: 142 mEq/L (ref 137–147)
TOTAL PROTEIN: 6.9 g/dL (ref 6.0–8.3)
Total Bilirubin: 0.4 mg/dL (ref 0.3–1.2)

## 2014-08-08 MED ORDER — HEPARIN SOD (PORK) LOCK FLUSH 100 UNIT/ML IV SOLN
500.0000 [IU] | Freq: Once | INTRAVENOUS | Status: AC | PRN
  Administered 2014-08-08: 500 [IU]
  Filled 2014-08-08: qty 5

## 2014-08-08 MED ORDER — TRASTUZUMAB CHEMO INJECTION 440 MG
6.0000 mg/kg | Freq: Once | INTRAVENOUS | Status: AC
  Administered 2014-08-08: 420 mg via INTRAVENOUS
  Filled 2014-08-08: qty 20

## 2014-08-08 MED ORDER — ACETAMINOPHEN 325 MG PO TABS
650.0000 mg | ORAL_TABLET | Freq: Once | ORAL | Status: AC
  Administered 2014-08-08: 650 mg via ORAL
  Filled 2014-08-08: qty 2

## 2014-08-08 MED ORDER — SODIUM CHLORIDE 0.9 % IV SOLN
Freq: Once | INTRAVENOUS | Status: AC
  Administered 2014-08-08: 11:00:00 via INTRAVENOUS

## 2014-08-08 MED ORDER — POTASSIUM CHLORIDE CRYS ER 20 MEQ PO TBCR: 20.0000 meq | EXTENDED_RELEASE_TABLET | Freq: Two times a day (BID) | ORAL | Status: DC

## 2014-08-08 MED ORDER — DIPHENHYDRAMINE HCL 25 MG PO CAPS
50.0000 mg | ORAL_CAPSULE | Freq: Once | ORAL | Status: AC
  Administered 2014-08-08: 50 mg via ORAL
  Filled 2014-08-08: qty 2

## 2014-08-08 MED ORDER — SODIUM CHLORIDE 0.9 % IJ SOLN: 10.0000 mL | INTRAMUSCULAR | Status: DC | PRN

## 2014-08-08 NOTE — Patient Instructions (Signed)
[place] Discharge Instructions  RECOMMENDATIONS MADE BY THE CONSULTANT AND ANY TEST RESULTS WILL BE SENT TO YOUR REFERRING PHYSICIAN.  Today you received Herceptin. Please follow up as scheduled. Please call for any questions or concerns.   Thank you for choosing [place] to provide your oncology and hematology care.  To afford each patient quality time with our providers, please arrive at least 15 minutes before your scheduled appointment time.  With your help, our goal is to use those 15 minutes to complete the necessary work-up to ensure our physicians have the information they need to help with your evaluation and healthcare recommendations.    Effective January 1st, 2014, we ask that you re-schedule your appointment with our physicians should you arrive 10 or more minutes late for your appointment.  We strive to give you quality time with our providers, and arriving late affects you and other patients whose appointments are after yours.    Again, thank you for choosing [facility].  Our hope is that these requests will decrease the amount of time that you wait before being seen by our physicians.       _____________________________________________________________  Should you have questions after your visit to [facility], please contact our office at [phone] between the hours of 8:30 a.m. and 4:30 p.m.  Voicemails left after 4:30 p.m. will not be returned until the following business day.  For prescription refill requests, have your pharmacy contact our office with your prescription refill request.    _______________________________________________________________  We hope that we have given you very good care.  You may receive a patient satisfaction survey in the mail, please complete it and return it as soon as possible.  We value your  feedback!  _______________________________________________________________  Have you asked about our STAR program?  STAR stands for Survivorship Training and Rehabilitation, and this is a nationally recognized cancer care program that focuses on survivorship and rehabilitation.  Cancer and cancer treatments may cause problems, such as, pain, making you feel tired and keeping you from doing the things that you need or want to do. Cancer rehabilitation can help. Our goal is to reduce these troubling effects and help you have the best quality of life possible.  You may receive a survey from a nurse that asks questions about your current state of health.  Based on the survey results, all eligible patients will be referred to the [facility] program for an evaluation so we can better serve you!  A frequently asked questions sheet is available upon request.

## 2014-08-08 NOTE — Progress Notes (Signed)
Patient tolerated treatment well.

## 2014-08-14 ENCOUNTER — Telehealth (HOSPITAL_COMMUNITY): Payer: Self-pay | Admitting: *Deleted

## 2014-08-14 NOTE — Telephone Encounter (Signed)
I notified Tom via inbasket yesterday that patient wass having a very strange feeling/sensation in her esophagus when she bends over x the last 2 days. (in esophagus area). This is occurring from the bottom of the neck to the middle of the chest. Tom instructed me to tell her that it sounded like acid reflux and to be sure that she was taking her Nexium BID. He also instructed me to tell her she could f/u with her PCP or GI MD if she had a GI MD. I left a voicemail on Daya's phone giving her these details. I told her to call back if there were any questions.

## 2014-08-28 ENCOUNTER — Encounter (HOSPITAL_COMMUNITY): Payer: Self-pay

## 2014-08-28 ENCOUNTER — Encounter (HOSPITAL_COMMUNITY): Payer: BC Managed Care – PPO | Attending: Hematology & Oncology

## 2014-08-28 DIAGNOSIS — C50912 Malignant neoplasm of unspecified site of left female breast: Secondary | ICD-10-CM

## 2014-08-28 DIAGNOSIS — Z5112 Encounter for antineoplastic immunotherapy: Secondary | ICD-10-CM

## 2014-08-28 DIAGNOSIS — C773 Secondary and unspecified malignant neoplasm of axilla and upper limb lymph nodes: Secondary | ICD-10-CM | POA: Diagnosis present

## 2014-08-28 LAB — CBC WITH DIFFERENTIAL/PLATELET
BASOS ABS: 0 10*3/uL (ref 0.0–0.1)
BASOS PCT: 1 % (ref 0–1)
Eosinophils Absolute: 0.2 10*3/uL (ref 0.0–0.7)
Eosinophils Relative: 6 % — ABNORMAL HIGH (ref 0–5)
HEMATOCRIT: 35.1 % — AB (ref 36.0–46.0)
HEMOGLOBIN: 11.5 g/dL — AB (ref 12.0–15.0)
LYMPHS ABS: 1.4 10*3/uL (ref 0.7–4.0)
Lymphocytes Relative: 32 % (ref 12–46)
MCH: 34 pg (ref 26.0–34.0)
MCHC: 32.8 g/dL (ref 30.0–36.0)
MCV: 103.8 fL — AB (ref 78.0–100.0)
Monocytes Absolute: 0.3 10*3/uL (ref 0.1–1.0)
Monocytes Relative: 8 % (ref 3–12)
Neutro Abs: 2.4 10*3/uL (ref 1.7–7.7)
Neutrophils Relative %: 53 % (ref 43–77)
Platelets: 183 10*3/uL (ref 150–400)
RBC: 3.38 MIL/uL — AB (ref 3.87–5.11)
RDW: 11.9 % (ref 11.5–15.5)
WBC: 4.4 10*3/uL (ref 4.0–10.5)

## 2014-08-28 LAB — COMPREHENSIVE METABOLIC PANEL
ALT: 14 U/L (ref 0–35)
AST: 18 U/L (ref 0–37)
Albumin: 4.3 g/dL (ref 3.5–5.2)
Alkaline Phosphatase: 74 U/L (ref 39–117)
Anion gap: 5 (ref 5–15)
BUN: 13 mg/dL (ref 6–23)
CALCIUM: 9.4 mg/dL (ref 8.4–10.5)
CO2: 30 mmol/L (ref 19–32)
CREATININE: 0.75 mg/dL (ref 0.50–1.10)
Chloride: 104 mEq/L (ref 96–112)
GFR calc Af Amer: >90 mL/min (ref >90)
Glucose, Bld: 94 mg/dL (ref 70–99)
Potassium: 3.4 mmol/L — ABNORMAL LOW (ref 3.5–5.1)
SODIUM: 139 mmol/L (ref 135–145)
TOTAL PROTEIN: 6.7 g/dL (ref 6.0–8.3)
Total Bilirubin: 0.6 mg/dL (ref 0.3–1.2)

## 2014-08-28 MED ORDER — DIPHENHYDRAMINE HCL 25 MG PO CAPS
50.0000 mg | ORAL_CAPSULE | Freq: Once | ORAL | Status: AC
  Administered 2014-08-28: 50 mg via ORAL

## 2014-08-28 MED ORDER — HEPARIN SOD (PORK) LOCK FLUSH 100 UNIT/ML IV SOLN
500.0000 [IU] | Freq: Once | INTRAVENOUS | Status: AC | PRN
  Administered 2014-08-28: 500 [IU]
  Filled 2014-08-28: qty 5

## 2014-08-28 MED ORDER — ACETAMINOPHEN 325 MG PO TABS
ORAL_TABLET | ORAL | Status: AC
  Filled 2014-08-28: qty 2

## 2014-08-28 MED ORDER — SODIUM CHLORIDE 0.9 % IV SOLN
Freq: Once | INTRAVENOUS | Status: AC
  Administered 2014-08-28: 10:00:00 via INTRAVENOUS

## 2014-08-28 MED ORDER — DIPHENHYDRAMINE HCL 25 MG PO CAPS
ORAL_CAPSULE | ORAL | Status: AC
  Filled 2014-08-28: qty 2

## 2014-08-28 MED ORDER — ACETAMINOPHEN 325 MG PO TABS
650.0000 mg | ORAL_TABLET | Freq: Once | ORAL | Status: AC
  Administered 2014-08-28: 650 mg via ORAL

## 2014-08-28 MED ORDER — SODIUM CHLORIDE 0.9 % IJ SOLN: 10.0000 mL | INTRAMUSCULAR | Status: DC | PRN

## 2014-08-28 MED ORDER — TRASTUZUMAB CHEMO INJECTION 440 MG
6.0000 mg/kg | Freq: Once | INTRAVENOUS | Status: AC
  Administered 2014-08-28: 420 mg via INTRAVENOUS
  Filled 2014-08-28: qty 20

## 2014-08-28 NOTE — Progress Notes (Signed)
Tolerated last Herceptin with no problems.

## 2014-08-28 NOTE — Patient Instructions (Signed)
[place] Discharge Instructions  RECOMMENDATIONS MADE BY THE CONSULTANT AND ANY TEST RESULTS WILL BE SENT TO YOUR REFERRING PHYSICIAN.  EXAM FINDINGS BY THE PHYSICIAN TODAY AND SIGNS OR SYMPTOMS TO REPORT TO CLINIC OR PRIMARY PHYSICIAN:    This was your LAST Herceptin! Go Girl!  Return to see Dr. Whitney Muse on January 22 @ 10am. Arrive 15 minutes early.  Port flushes every 6 weeks until Dr. Whitney Muse tells you it is ok to remove port.   Thank you for choosing [place] to provide your oncology and hematology care.  To afford each patient quality time with our providers, please arrive at least 15 minutes before your scheduled appointment time.  With your help, our goal is to use those 15 minutes to complete the necessary work-up to ensure our physicians have the information they need to help with your evaluation and healthcare recommendations.    Effective January 1st, 2014, we ask that you re-schedule your appointment with our physicians should you arrive 10 or more minutes late for your appointment.  We strive to give you quality time with our providers, and arriving late affects you and other patients whose appointments are after yours.    Again, thank you for choosing [facility].  Our hope is that these requests will decrease the amount of time that you wait before being seen by our physicians.       _____________________________________________________________  Should you have questions after your visit to [facility], please contact our office at [phone] between the hours of 8:30 a.m. and 4:30 p.m.  Voicemails left after 4:30 p.m. will not be returned until the following business day.  For prescription refill requests, have your pharmacy contact our office with your prescription refill request.    _______________________________________________________________  We hope that we have given you very good care.  You may  receive a patient satisfaction survey in the mail, please complete it and return it as soon as possible.  We value your feedback!  _______________________________________________________________  Have you asked about our STAR program?  STAR stands for Survivorship Training and Rehabilitation, and this is a nationally recognized cancer care program that focuses on survivorship and rehabilitation.  Cancer and cancer treatments may cause problems, such as, pain, making you feel tired and keeping you from doing the things that you need or want to do. Cancer rehabilitation can help. Our goal is to reduce these troubling effects and help you have the best quality of life possible.  You may receive a survey from a nurse that asks questions about your current state of health.  Based on the survey results, all eligible patients will be referred to the [facility] program for an evaluation so we can better serve you!  A frequently asked questions sheet is available upon request.

## 2014-09-19 ENCOUNTER — Ambulatory Visit (HOSPITAL_COMMUNITY): Payer: BC Managed Care – PPO | Admitting: Hematology & Oncology

## 2014-09-24 ENCOUNTER — Encounter (HOSPITAL_COMMUNITY): Payer: Self-pay | Admitting: Hematology & Oncology

## 2014-09-24 ENCOUNTER — Encounter (HOSPITAL_COMMUNITY): Payer: Self-pay | Admitting: Lab

## 2014-09-24 ENCOUNTER — Encounter (HOSPITAL_COMMUNITY): Payer: BC Managed Care – PPO | Attending: Hematology and Oncology | Admitting: Hematology & Oncology

## 2014-09-24 VITALS — BP 114/70 | HR 73 | Resp 16 | Wt 130.3 lb

## 2014-09-24 DIAGNOSIS — G629 Polyneuropathy, unspecified: Secondary | ICD-10-CM

## 2014-09-24 DIAGNOSIS — C50912 Malignant neoplasm of unspecified site of left female breast: Secondary | ICD-10-CM | POA: Diagnosis not present

## 2014-09-24 DIAGNOSIS — Z170 Estrogen receptor positive status [ER+]: Secondary | ICD-10-CM

## 2014-09-24 DIAGNOSIS — C773 Secondary and unspecified malignant neoplasm of axilla and upper limb lymph nodes: Secondary | ICD-10-CM

## 2014-09-24 DIAGNOSIS — M25571 Pain in right ankle and joints of right foot: Secondary | ICD-10-CM

## 2014-09-24 DIAGNOSIS — C50919 Malignant neoplasm of unspecified site of unspecified female breast: Secondary | ICD-10-CM | POA: Insufficient documentation

## 2014-09-24 MED ORDER — EXEMESTANE 25 MG PO TABS: ORAL_TABLET | ORAL | Status: DC

## 2014-09-24 MED ORDER — TRAMADOL HCL 50 MG PO TABS: 50.0000 mg | ORAL_TABLET | Freq: Four times a day (QID) | ORAL | Status: DC | PRN

## 2014-09-24 NOTE — Progress Notes (Signed)
Referral made to Dr Merlene Laughter for nerve conduction study appt Feb 15

## 2014-09-24 NOTE — Patient Instructions (Signed)
[place] at [facility]  Discharge Instructions:  Please call with any problems or concerns prior to your next followup We will call you with the results of your DEXA and your nerve conduction test _______________________________________________________________  Thank you for choosing [place] at [facility] to provide your oncology and hematology care.  To afford each patient quality time with our providers, please arrive at least 15 minutes before your scheduled appointment.  You need to re-schedule your appointment if you arrive 10 or more minutes late.  We strive to give you quality time with our providers, and arriving late affects you and other patients whose appointments are after yours.  Also, if you no show three or more times for appointments you may be dismissed from the clinic.  Again, thank you for choosing [place] at [facility] hope is that these requests will allow you access to exceptional care and in a timely manner. _______________________________________________________________  If you have questions after your visit, please contact our office at [phone] between the hours of 8:30 a.m. and 5:00 p.m. Voicemails left after 4:30 p.m. will not be returned until the following business day. _______________________________________________________________  For prescription refill requests, have your pharmacy contact our office. _______________________________________________________________  Recommendations made by the consultant and any test results will be sent to your referring physician. _______________________________________________________________

## 2014-09-24 NOTE — Progress Notes (Signed)
Heather Vaughan, Heather Vaughan [place] [address]   DIAGNOSIS: Invasive ductal carcinoma metastasized to axillary lymph node, left   Staging form: Breast, AJCC 7th Edition     Clinical: Stage IIA (T1c, N1, cM0) - Signed by Baird Cancer, PA-C on 09/15/2013   SUMMARY OF ONCOLOGIC HISTORY:   Invasive ductal carcinoma metastasized to axillary lymph node, left   05/28/2013 Imaging Screening mammogram   06/19/2013 Imaging Left diagnostic mammogram   06/25/2013 Imaging CT CAP- borderline enlarged axillary lymph node on left.  Small pulmonary nodules noted but too small for PET or biopsy; surveillance recommended.    07/03/2013 Surgery Left partial mastectomy.  Invasive ductal carcinoma. 1.4 cm.  POSITIVE MARGINS.  Additional extensive DCIS noted.  ER 74%, PR 16%, Ki-67 52%, HER2 NEGATIVE.   07/17/2013 Surgery Left modified radiacl mastectomy with left axillary node dissection.  Negative residual cancer.  Negative inked margins.  1/12 positive lymph nodes.  FINAL MARGINS NEGATIVE.  LYMPH NODE IS HER2 POSITIVE.   08/13/2013 Imaging MUGA scan shows left ventricular EF of 62%   08/27/2013 - 12/18/2013 Chemotherapy TCH x 6 cycles   01/07/2014 -  Chemotherapy Herceptin x 52 weeks   01/27/2014 - 03/13/2014 Radiation Therapy By Dr. Pablo Ledger   03/29/2014 -  Chemotherapy Aromasin to start on 03/29/2014    CURRENT THERAPY: Aromasin (beginning on on 03/29/2014).   INTERVAL HISTORY: Heather Vaughan 59 y.o. female returns for follow-up of her ER+, HER-2 neu + breast cancer. Her feet continue to hurt. She has been on gabapentin (caused swelling) and she is currently on lacosamide. Her neuropathy is worse on the left side, although she describes symptoms in both feet. She thinks it is getting worse and not improving.  She takes her aromasin daily.  She denies any major problems other than occasional nausea.  MEDICAL HISTORY: Past Medical History  Diagnosis Date  . GERD (gastroesophageal  reflux disease)   . Hypertension   . Bronchitis 07/27/2011  . Barrett's esophagus   . Anxiety   . Breast cancer, left breast     has GERD (gastroesophageal reflux disease); Hypertension; Barrett esophagus; Invasive ductal carcinoma metastasized to axillary lymph node, left; Peripheral neuropathy due to chemotherapy; Unstable balance; Stiffness of joint, not elsewhere classified, ankle and foot; and Ankle weakness on her problem list.     has No Known Allergies.  [name] had no medications administered during this visit.  SURGICAL HISTORY: Past Surgical History  Procedure Laterality Date  . Excision morton's neuroma  07/14/2011    Procedure: EXCISION MORTON'S NEUROMA;  Surgeon: Marcheta Grammes;  Location: [facility];  Service: Orthopedics;  Laterality: Right;  Removal Neuroma 3rd Interspace Right Foot  . Esophagogastroduodenoscopy  08/03/2011    Procedure: ESOPHAGOGASTRODUODENOSCOPY (EGD);  Surgeon: Rogene Houston, MD;  Location: [facility];  Service: Endoscopy;  Laterality: N/A;  12:45  . Partial mastectomy with needle localization Left 07/03/2013    Procedure: PARTIAL MASTECTOMY STATUS POST NEEDLE LOCALIZATION;  Surgeon: Scherry Ran, MD;  Location: [facility];  Service: General;  Laterality: Left;  . Breast biopsy Bilateral     X 4- benign  . Mastectomy modified radical Left 07/17/2013    Procedure: MASTECTOMY MODIFIED RADICAL WITH LEFT AXILLARY TISSUE REMOVAL;  Surgeon: Scherry Ran, MD;  Location: [facility];  Service: General;  Laterality: Left;  . Portacath placement Right 08/16/2013    Procedure: INSERTION PORT-A-CATH RIGHT SUBCLAVIAN;  Surgeon: Scherry Ran, MD;  Location: [facility];  Service: General;  Laterality: Right;    SOCIAL HISTORY: History   Social History  . Marital Status: Married    Spouse Name: N/A    Number of Children: N/A  . Years of Education: N/A   Occupational History  . Not on file.   Social History Main Topics  . Smoking status:  Former Smoker -- 1.00 packs/day for 20 years    Types: Cigarettes    Quit date: 09/05/1993  . Smokeless tobacco: Never Used  . Alcohol Use: No     Comment: occasionally  . Drug Use: No  . Sexual Activity: Not on file   Other Topics Concern  . Not on file   Social History Narrative    FAMILY HISTORY: Family History  Problem Relation Age of Onset  . Anesthesia problems Neg Hx   . Hypotension Neg Hx   . Malignant hyperthermia Neg Hx   . Pseudochol deficiency Neg Hx     Review of Systems  Constitutional: Negative.   HENT: Negative.   Eyes: Negative.   Respiratory: Negative.   Cardiovascular: Negative.   Gastrointestinal: Negative.   Genitourinary: Negative.   Musculoskeletal:       Describes L foot as throbbing. Wants to ball toes up in the am before walking  Skin: Negative.   Neurological: Positive for tingling.       Left foot > right   Endo/Heme/Allergies: Negative.   Psychiatric/Behavioral: Negative.     PHYSICAL EXAMINATION  ECOG PERFORMANCE STATUS: 0 - Asymptomatic  Filed Vitals:   09/24/14 0900  BP: 114/70  Pulse: 73  Resp: 16    Physical Exam  Constitutional: She is oriented to person, place, and time and well-developed, well-nourished, and in no distress.  HENT:  Head: Normocephalic and atraumatic.  Nose: Nose normal.  Mouth/Throat: Oropharynx is clear and moist. No oropharyngeal exudate.  Eyes: Conjunctivae and EOM are normal. Pupils are equal, round, and reactive to light. Right eye exhibits no discharge. Left eye exhibits no discharge. No scleral icterus.  Neck: Normal range of motion. Neck supple. No tracheal deviation present. No thyromegaly present.  Cardiovascular: Normal rate, regular rhythm and normal heart sounds.  Exam reveals no gallop and no friction rub.   No murmur heard. Pulmonary/Chest: Effort normal and breath sounds normal. She has no wheezes. She has no rales.  Left chest wall without nodularity or palpable abnormality. Both  axillae clear. Right breast without skin or nipple changes. No palpable masses  Abdominal: Soft. Bowel sounds are normal. She exhibits no distension and no mass. There is no tenderness. There is no rebound and no guarding.  Musculoskeletal: Normal range of motion. She exhibits no edema.  Lymphadenopathy:    She has no cervical adenopathy.  Neurological: She is alert and oriented to person, place, and time. She has normal reflexes. No cranial nerve deficit. Gait normal. Coordination normal.  Skin: Skin is warm and dry. No rash noted.  Psychiatric: Mood, memory, affect and judgment normal.  Nursing note and vitals reviewed.   LABORATORY DATA:  CBC    Component Value Date/Time   WBC 4.4 08/28/2014 1001   RBC 3.38* 08/28/2014 1001   RBC 2.41* 01/13/2014 1000   HGB 11.5* 08/28/2014 1001   HCT 35.1* 08/28/2014 1001   PLT 183 08/28/2014 1001   MCV 103.8* 08/28/2014 1001   MCH 34.0 08/28/2014 1001   MCHC 32.8 08/28/2014 1001   RDW 11.9 08/28/2014 1001   LYMPHSABS 1.4 08/28/2014 1001   MONOABS 0.3 08/28/2014 1001  EOSABS 0.2 08/28/2014 1001   BASOSABS 0.0 08/28/2014 1001   CMP     Component Value Date/Time   NA 139 08/28/2014 1001   K 3.4* 08/28/2014 1001   CL 104 08/28/2014 1001   CO2 30 08/28/2014 1001   GLUCOSE 94 08/28/2014 1001   BUN 13 08/28/2014 1001   CREATININE 0.75 08/28/2014 1001   CALCIUM 9.4 08/28/2014 1001   PROT 6.7 08/28/2014 1001   ALBUMIN 4.3 08/28/2014 1001   AST 18 08/28/2014 1001   ALT 14 08/28/2014 1001   ALKPHOS 74 08/28/2014 1001   BILITOT 0.6 08/28/2014 1001   GFRNONAA >90 08/28/2014 1001   GFRAA >90 08/28/2014 1001       ASSESSMENT and THERAPY PLAN:    Invasive ductal carcinoma metastasized to axillary lymph node, left This is a pleasant 59 year old female with an ER positive PR positive HER-2 positive carcinoma of the left breast she completed [facility] and one full year of Herceptin. Underwent adjuvant radiation with Dr. Pablo Ledger. Breast  exam/chest wall exam today is benign. She is currently on Aromasin and tolerating it well. I advised her she could take nausea medication for her intermittent nausea. X  I discussed with her obtaining a nerve conduction study as a do not feel we have a good understanding of her neuropathy. It occurred several months after completing Taxotere, and it appears to be getting worse and not better. She is agreeable to proceed with a nerve conduction. I advised her that pending the results of the study we may need to refer her to a neurologist.  She is due for a DEXA we will get that arranged for her and advise her to call her with the results. Should she have evidence of osteopenia we will then talk about the addition of prolia, given its benefits in women on aromatase inhibitors. I will see her back again in 3 months with laboratory studies and sooner if needed.   I did sugggest to her that we could increase her cymbalta for her neuropathy. She was not interested in pursuing that at this time.   All questions were answered. The patient knows to call the clinic with any problems, questions or concerns. We can certainly see the patient much sooner if necessary. Molli Hazard 09/25/2014

## 2014-09-25 ENCOUNTER — Encounter (HOSPITAL_COMMUNITY): Payer: Self-pay | Admitting: Hematology & Oncology

## 2014-09-25 NOTE — Assessment & Plan Note (Signed)
This is a pleasant 58-year-old female with an ER positive PR positive HER-2 positive carcinoma of the left breast she completed TCH and one full year of Herceptin. Underwent adjuvant radiation with Dr. Wentworth. Breast exam/chest wall exam today is benign. She is currently on Aromasin and tolerating it well. I advised her she could take nausea medication for her intermittent nausea. X  I discussed with her obtaining a nerve conduction study as a do not feel we have a good understanding of her neuropathy. It occurred several months after completing Taxotere, and it appears to be getting worse and not better. She is agreeable to proceed with a nerve conduction. I advised her that pending the results of the study we may need to refer her to a neurologist.  She is due for a DEXA we will get that arranged for her and advise her to call her with the results. Should she have evidence of osteopenia we will then talk about the addition of prolia, given its benefits in women on aromatase inhibitors. I will see her back again in 3 months with laboratory studies and sooner if needed. 

## 2014-09-29 ENCOUNTER — Ambulatory Visit (HOSPITAL_COMMUNITY)
Admission: RE | Admit: 2014-09-29 | Discharge: 2014-09-29 | Disposition: A | Discharge status: Home / Self Care | Payer: 59 | Source: Ambulatory Visit | Attending: Hematology & Oncology | Admitting: Hematology & Oncology

## 2014-09-29 DIAGNOSIS — Z780 Asymptomatic menopausal state: Secondary | ICD-10-CM | POA: Diagnosis present

## 2014-09-29 DIAGNOSIS — C773 Secondary and unspecified malignant neoplasm of axilla and upper limb lymph nodes: Secondary | ICD-10-CM

## 2014-09-29 DIAGNOSIS — C50912 Malignant neoplasm of unspecified site of left female breast: Secondary | ICD-10-CM

## 2014-09-29 DIAGNOSIS — M8580 Other specified disorders of bone density and structure, unspecified site: Secondary | ICD-10-CM | POA: Diagnosis not present

## 2014-10-09 ENCOUNTER — Encounter (HOSPITAL_COMMUNITY): Payer: 59 | Attending: Hematology & Oncology

## 2014-10-09 ENCOUNTER — Encounter (HOSPITAL_COMMUNITY): Payer: Self-pay

## 2014-10-09 VITALS — BP 106/56 | HR 65 | Temp 98.7°F | Resp 16

## 2014-10-09 DIAGNOSIS — C50912 Malignant neoplasm of unspecified site of left female breast: Secondary | ICD-10-CM | POA: Diagnosis not present

## 2014-10-09 DIAGNOSIS — C773 Secondary and unspecified malignant neoplasm of axilla and upper limb lymph nodes: Secondary | ICD-10-CM | POA: Insufficient documentation

## 2014-10-09 DIAGNOSIS — M8580 Other specified disorders of bone density and structure, unspecified site: Secondary | ICD-10-CM | POA: Insufficient documentation

## 2014-10-09 DIAGNOSIS — Z95828 Presence of other vascular implants and grafts: Secondary | ICD-10-CM

## 2014-10-09 DIAGNOSIS — Z9889 Other specified postprocedural states: Secondary | ICD-10-CM | POA: Insufficient documentation

## 2014-10-09 DIAGNOSIS — M810 Age-related osteoporosis without current pathological fracture: Secondary | ICD-10-CM | POA: Insufficient documentation

## 2014-10-09 HISTORY — DX: Other specified disorders of bone density and structure, unspecified site: M85.80

## 2014-10-09 LAB — COMPREHENSIVE METABOLIC PANEL
ALK PHOS: 80 U/L (ref 39–117)
ALT: 14 U/L (ref 0–35)
ANION GAP: 4 — AB (ref 5–15)
AST: 16 U/L (ref 0–37)
Albumin: 3.9 g/dL (ref 3.5–5.2)
BUN: 15 mg/dL (ref 6–23)
CO2: 28 mmol/L (ref 19–32)
CREATININE: 0.66 mg/dL (ref 0.50–1.10)
Calcium: 8.9 mg/dL (ref 8.4–10.5)
Chloride: 105 mmol/L (ref 96–112)
GFR calc Af Amer: >90 mL/min (ref >90)
GFR calc non Af Amer: >90 mL/min (ref >90)
GLUCOSE: 123 mg/dL — AB (ref 70–99)
POTASSIUM: 3.3 mmol/L — AB (ref 3.5–5.1)
Sodium: 137 mmol/L (ref 135–145)
TOTAL PROTEIN: 6.6 g/dL (ref 6.0–8.3)
Total Bilirubin: 0.5 mg/dL (ref 0.3–1.2)

## 2014-10-09 MED ORDER — DENOSUMAB 60 MG/ML ~~LOC~~ SOLN
60.0000 mg | Freq: Once | SUBCUTANEOUS | Status: AC
  Administered 2014-10-09: 60 mg via SUBCUTANEOUS
  Filled 2014-10-09: qty 1

## 2014-10-09 MED ORDER — SODIUM CHLORIDE 0.9 % IJ SOLN
10.0000 mL | INTRAMUSCULAR | Status: DC | PRN
  Administered 2014-10-09: 10 mL via INTRAVENOUS
  Filled 2014-10-09: qty 10

## 2014-10-09 MED ORDER — HEPARIN SOD (PORK) LOCK FLUSH 100 UNIT/ML IV SOLN
500.0000 [IU] | Freq: Once | INTRAVENOUS | Status: AC
  Administered 2014-10-09: 500 [IU] via INTRAVENOUS
  Filled 2014-10-09: qty 5

## 2014-10-09 NOTE — Progress Notes (Signed)
Heather Vaughan presented for Portacath access and flush. Proper placement of portacath confirmed by CXR. Portacath located rt chest wall accessed with  H 20 needle. Good blood return present. Portacath flushed with 64ml NS and 500U/13ml Heparin and needle removed intact. Procedure without incident. Patient tolerated procedure well. Heather Vaughan presents today for injection per MD orders. Prolia administered SQ in left Abdomen. Administration without incident. Patient tolerated well.

## 2014-10-10 ENCOUNTER — Other Ambulatory Visit (HOSPITAL_COMMUNITY): Payer: Self-pay | Admitting: Oncology

## 2014-10-10 DIAGNOSIS — E876 Hypokalemia: Secondary | ICD-10-CM

## 2014-10-10 MED ORDER — POTASSIUM CHLORIDE CRYS ER 20 MEQ PO TBCR
20.0000 meq | EXTENDED_RELEASE_TABLET | Freq: Two times a day (BID) | ORAL | Status: DC
Start: 2014-10-10 — End: 2015-03-27

## 2014-10-22 ENCOUNTER — Ambulatory Visit (HOSPITAL_COMMUNITY): Payer: 59

## 2014-10-23 ENCOUNTER — Other Ambulatory Visit (HOSPITAL_COMMUNITY): Payer: 59

## 2014-10-29 ENCOUNTER — Other Ambulatory Visit (HOSPITAL_COMMUNITY): Payer: BC Managed Care – PPO

## 2014-11-05 ENCOUNTER — Encounter (HOSPITAL_COMMUNITY)
Admission: RE | Admit: 2014-11-05 | Discharge: 2014-11-05 | Disposition: A | Discharge status: Home / Self Care | Payer: 59 | Source: Ambulatory Visit | Attending: Oncology | Admitting: Oncology

## 2014-11-05 ENCOUNTER — Encounter (HOSPITAL_COMMUNITY): Payer: Self-pay

## 2014-11-05 DIAGNOSIS — C50919 Malignant neoplasm of unspecified site of unspecified female breast: Secondary | ICD-10-CM | POA: Diagnosis present

## 2014-11-05 DIAGNOSIS — C773 Secondary and unspecified malignant neoplasm of axilla and upper limb lymph nodes: Secondary | ICD-10-CM

## 2014-11-05 DIAGNOSIS — C50912 Malignant neoplasm of unspecified site of left female breast: Secondary | ICD-10-CM

## 2014-11-05 MED ORDER — HEPARIN SOD (PORK) LOCK FLUSH 100 UNIT/ML IV SOLN
INTRAVENOUS | Status: AC
  Filled 2014-11-05: qty 5

## 2014-11-05 MED ORDER — TECHNETIUM TC 99M-LABELED RED BLOOD CELLS IV KIT
25.0000 | PACK | Freq: Once | INTRAVENOUS | Status: AC | PRN
  Administered 2014-11-05: 25 via INTRAVENOUS

## 2014-11-06 ENCOUNTER — Ambulatory Visit (HOSPITAL_COMMUNITY): Payer: BC Managed Care – PPO | Admitting: Oncology

## 2014-11-07 ENCOUNTER — Ambulatory Visit (HOSPITAL_COMMUNITY): Payer: BC Managed Care – PPO | Admitting: Oncology

## 2014-11-20 ENCOUNTER — Encounter (HOSPITAL_COMMUNITY): Payer: 59

## 2014-11-24 ENCOUNTER — Encounter (HOSPITAL_COMMUNITY): Payer: 59 | Attending: Hematology and Oncology

## 2014-11-24 ENCOUNTER — Encounter (HOSPITAL_COMMUNITY): Payer: Self-pay

## 2014-11-24 VITALS — BP 127/67 | HR 72 | Temp 98.3°F | Resp 18

## 2014-11-24 DIAGNOSIS — C50912 Malignant neoplasm of unspecified site of left female breast: Secondary | ICD-10-CM | POA: Insufficient documentation

## 2014-11-24 DIAGNOSIS — Z452 Encounter for adjustment and management of vascular access device: Secondary | ICD-10-CM | POA: Diagnosis not present

## 2014-11-24 DIAGNOSIS — C773 Secondary and unspecified malignant neoplasm of axilla and upper limb lymph nodes: Secondary | ICD-10-CM | POA: Insufficient documentation

## 2014-11-24 DIAGNOSIS — Z95828 Presence of other vascular implants and grafts: Secondary | ICD-10-CM

## 2014-11-24 DIAGNOSIS — C50919 Malignant neoplasm of unspecified site of unspecified female breast: Secondary | ICD-10-CM | POA: Insufficient documentation

## 2014-11-24 MED ORDER — SODIUM CHLORIDE 0.9 % IJ SOLN
10.0000 mL | INTRAMUSCULAR | Status: DC | PRN
  Administered 2014-11-24: 10 mL via INTRAVENOUS
  Filled 2014-11-24: qty 10

## 2014-11-24 MED ORDER — HEPARIN SOD (PORK) LOCK FLUSH 100 UNIT/ML IV SOLN
500.0000 [IU] | Freq: Once | INTRAVENOUS | Status: AC
  Administered 2014-11-24: 500 [IU] via INTRAVENOUS

## 2014-11-24 NOTE — Progress Notes (Signed)
Heather Vaughan presented for Portacath access and flush. Portacath located right chest wall accessed with  H 20 needle. Good blood return present. Portacath flushed with 51ml NS and 500U/45ml Heparin and needle removed intact. Procedure without incident. Patient tolerated procedure well.

## 2014-11-24 NOTE — Patient Instructions (Signed)
[place] at [facility] Discharge Instructions  RECOMMENDATIONS MADE BY THE CONSULTANT AND ANY TEST RESULTS WILL BE SENT TO YOUR REFERRING PHYSICIAN.  You received your port flush today. Call for any concerns or questions. Will see you at your next appt.  Thank you for choosing [place] at [facility] to provide your oncology and hematology care.  To afford each patient quality time with our provider, please arrive at least 15 minutes before your scheduled appointment time.    You need to re-schedule your appointment should you arrive 10 or more minutes late.  We strive to give you quality time with our providers, and arriving late affects you and other patients whose appointments are after yours.  Also, if you no show three or more times for appointments you may be dismissed from the clinic at the providers discretion.     Again, thank you for choosing [facility].  Our hope is that these requests will decrease the amount of time that you wait before being seen by our physicians.       _____________________________________________________________  Should you have questions after your visit to [facility], please contact our office at [phone] between the hours of 8:30 a.m. and 4:30 p.m.  Voicemails left after 4:30 p.m. will not be returned until the following business day.  For prescription refill requests, have your pharmacy contact our office.

## 2014-12-23 ENCOUNTER — Ambulatory Visit (HOSPITAL_COMMUNITY): Payer: Self-pay | Admitting: Hematology & Oncology

## 2014-12-23 ENCOUNTER — Encounter (HOSPITAL_COMMUNITY): Payer: 59

## 2014-12-24 ENCOUNTER — Ambulatory Visit (HOSPITAL_COMMUNITY): Payer: Self-pay | Admitting: Hematology & Oncology

## 2014-12-26 ENCOUNTER — Encounter (HOSPITAL_COMMUNITY): Payer: 59 | Attending: Hematology and Oncology | Admitting: Oncology

## 2014-12-26 ENCOUNTER — Encounter (HOSPITAL_COMMUNITY): Payer: 59

## 2014-12-26 ENCOUNTER — Encounter (HOSPITAL_COMMUNITY): Payer: Self-pay | Admitting: Oncology

## 2014-12-26 VITALS — BP 127/71 | HR 68 | Temp 98.5°F | Resp 16 | Wt 122.8 lb

## 2014-12-26 DIAGNOSIS — C773 Secondary and unspecified malignant neoplasm of axilla and upper limb lymph nodes: Secondary | ICD-10-CM | POA: Insufficient documentation

## 2014-12-26 DIAGNOSIS — K219 Gastro-esophageal reflux disease without esophagitis: Secondary | ICD-10-CM | POA: Diagnosis not present

## 2014-12-26 DIAGNOSIS — C50919 Malignant neoplasm of unspecified site of unspecified female breast: Secondary | ICD-10-CM

## 2014-12-26 DIAGNOSIS — M8580 Other specified disorders of bone density and structure, unspecified site: Secondary | ICD-10-CM | POA: Diagnosis not present

## 2014-12-26 DIAGNOSIS — C50912 Malignant neoplasm of unspecified site of left female breast: Secondary | ICD-10-CM | POA: Diagnosis present

## 2014-12-26 LAB — CBC WITH DIFFERENTIAL/PLATELET
BASOS ABS: 0 10*3/uL (ref 0.0–0.1)
Basophils Relative: 0 % (ref 0–1)
EOS ABS: 0.2 10*3/uL (ref 0.0–0.7)
EOS PCT: 2 % (ref 0–5)
HCT: 38.5 % (ref 36.0–46.0)
HEMOGLOBIN: 12.6 g/dL (ref 12.0–15.0)
LYMPHS ABS: 2 10*3/uL (ref 0.7–4.0)
Lymphocytes Relative: 27 % (ref 12–46)
MCH: 34.1 pg — ABNORMAL HIGH (ref 26.0–34.0)
MCHC: 32.7 g/dL (ref 30.0–36.0)
MCV: 104.1 fL — ABNORMAL HIGH (ref 78.0–100.0)
MONO ABS: 0.5 10*3/uL (ref 0.1–1.0)
MONOS PCT: 6 % (ref 3–12)
Neutro Abs: 4.8 10*3/uL (ref 1.7–7.7)
Neutrophils Relative %: 65 % (ref 43–77)
Platelets: 213 10*3/uL (ref 150–400)
RBC: 3.7 MIL/uL — AB (ref 3.87–5.11)
RDW: 12 % (ref 11.5–15.5)
WBC: 7.4 10*3/uL (ref 4.0–10.5)

## 2014-12-26 LAB — COMPREHENSIVE METABOLIC PANEL
ALK PHOS: 71 U/L (ref 39–117)
ALT: 16 U/L (ref 0–35)
AST: 20 U/L (ref 0–37)
Albumin: 4.6 g/dL (ref 3.5–5.2)
Anion gap: 8 (ref 5–15)
BUN: 14 mg/dL (ref 6–23)
CO2: 29 mmol/L (ref 19–32)
Calcium: 9.1 mg/dL (ref 8.4–10.5)
Chloride: 101 mmol/L (ref 96–112)
Creatinine, Ser: 0.71 mg/dL (ref 0.50–1.10)
GFR calc Af Amer: >90 mL/min (ref >90)
Glucose, Bld: 77 mg/dL (ref 70–99)
POTASSIUM: 3.5 mmol/L (ref 3.5–5.1)
SODIUM: 138 mmol/L (ref 135–145)
TOTAL PROTEIN: 7.3 g/dL (ref 6.0–8.3)
Total Bilirubin: 0.7 mg/dL (ref 0.3–1.2)

## 2014-12-26 MED ORDER — SODIUM CHLORIDE 0.9 % IJ SOLN
10.0000 mL | INTRAMUSCULAR | Status: DC | PRN
  Administered 2014-12-26: 10 mL via INTRAVENOUS
  Filled 2014-12-26: qty 10

## 2014-12-26 MED ORDER — EXEMESTANE 25 MG PO TABS: ORAL_TABLET | ORAL | Status: DC

## 2014-12-26 MED ORDER — LIDOCAINE-PRILOCAINE 2.5-2.5 % EX CREA: TOPICAL_CREAM | CUTANEOUS | Status: DC

## 2014-12-26 MED ORDER — HEPARIN SOD (PORK) LOCK FLUSH 100 UNIT/ML IV SOLN
500.0000 [IU] | Freq: Once | INTRAVENOUS | Status: AC
  Administered 2014-12-26: 500 [IU] via INTRAVENOUS

## 2014-12-26 NOTE — Patient Instructions (Signed)
..  [place] at [facility] Discharge Instructions  RECOMMENDATIONS MADE BY THE CONSULTANT AND ANY TEST RESULTS WILL BE SENT TO YOUR REFERRING PHYSICIAN.  Exam and discussion with Robynn Pane, PA-C. Port flush today and in 6 weeks. Lab work today.  Will call with results. Return in 3 months to see Dr. Whitney Muse. Aromesin and EMLA cream electronically sent to pharmacy. Call with any new symptoms, questions, or concerns.   Thank you for choosing [place] at [facility] to provide your oncology and hematology care.  To afford each patient quality time with our provider, please arrive at least 15 minutes before your scheduled appointment time.    You need to re-schedule your appointment should you arrive 10 or more minutes late.  We strive to give you quality time with our providers, and arriving late affects you and other patients whose appointments are after yours.  Also, if you no show three or more times for appointments you may be dismissed from the clinic at the providers discretion.     Again, thank you for choosing [facility].  Our hope is that these requests will decrease the amount of time that you wait before being seen by our physicians.       _____________________________________________________________  Should you have questions after your visit to [facility], please contact our office at [phone] between the hours of 8:30 a.m. and 4:30 p.m.  Voicemails left after 4:30 p.m. will not be returned until the following business day.  For prescription refill requests, have your pharmacy contact our office.

## 2014-12-26 NOTE — Assessment & Plan Note (Addendum)
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 S/P left partial mastectomy on 07/03/2013 demonstrating a 1.4 cm invasive component that was ER 74%, PR 16%, Ki-67 52%, and HER2 negative. However, positive margins were noted and therefore on 07/17/2013 she underwent a left modified radical mastectomy and left axillary lymph node dissection. Clear margins were ascertained with 0/89 positive lymph nodes positive for metastatic disease. Testing on the positive lymph node reveals HER2 positivity. OncoType DX score was 29 placing her in the high-end of the intermediate risk group for recurrence. She was therefore started on [facility] therapy on 08/27/2013 and finished TC on 12/18/2013 after 6 cycles. She is S/P radiation therapy by Dr. Pablo Ledger on 01/27/2014- 03/14/2014. She is S/P completion of 52 weeks worth of Herceptin therapy.  Now on Aromasin (beginning on on 03/29/2014).    Continue Aromasin daily.  Compliance encouraged.   Labs today: CBC diff, CMET.  Labs in 3 months: CBC diff, CMET  I have refilled her Aromasin daily.  I have refilled her EMLA cream.  I will await her K+ results before refilling her Kdur.  If she becomes Hypokalemic in the future, we will defer treatment to her primary care provider as it is no longer treatment-induced and is most likely secondary to HCTZ requirements.  Return in 3 months for follow-up.

## 2014-12-26 NOTE — Progress Notes (Signed)
Heather Vaughan, [place] [address]  Breast cancer metastasized to axillary lymph node, left - Plan: CBC with Differential, Comprehensive metabolic panel  Osteopenia  Breast cancer metastasized to axillary lymph node, unspecified laterality - Plan: exemestane (AROMASIN) 25 MG tablet, lidocaine-prilocaine (EMLA) cream  CURRENT THERAPY: Aromasin beginning on 03/29/2014  INTERVAL HISTORY: Heather Vaughan 59 y.o. 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 S/P left partial mastectomy on 07/03/2013 demonstrating a 1.4 cm invasive component that was ER 74%, PR 16%, Ki-67 52%, and HER2 negative. However, positive margins were noted and therefore on 07/17/2013 she underwent a left modified radical mastectomy and left axillary lymph node dissection. Clear margins were ascertained with 9/93 positive lymph nodes positive for metastatic disease. Testing on the positive lymph node reveals HER2 positivity. OncoType DX score was 29 placing her in the high-end of the intermediate risk group for recurrence. She was therefore started on [facility] therapy on 08/27/2013 and finished TC on 12/18/2013 after 6 cycles. She is S/P radiation therapy by Dr. Pablo Ledger on 01/27/2014- 03/14/2014. She is S/P completion of 52 weeks worth of Herceptin therapy.  Now on Aromasin (beginning on on 03/29/2014).      Invasive ductal carcinoma metastasized to axillary lymph node, left   05/28/2013 Imaging Screening mammogram   06/19/2013 Imaging Left diagnostic mammogram   06/25/2013 Imaging CT CAP- borderline enlarged axillary lymph node on left.  Small pulmonary nodules noted but too small for PET or biopsy; surveillance recommended.    07/03/2013 Surgery Left partial mastectomy.  Invasive ductal carcinoma. 1.4 cm.  POSITIVE MARGINS.  Additional extensive DCIS noted.  ER 74%, PR 16%, Ki-67 52%, HER2 NEGATIVE.   07/17/2013 Surgery Left modified radiacl  mastectomy with left axillary node dissection.  Negative residual cancer.  Negative inked margins.  1/12 positive lymph nodes.  FINAL MARGINS NEGATIVE.  LYMPH NODE IS HER2 POSITIVE.   08/13/2013 Imaging MUGA scan shows left ventricular EF of 62%   08/27/2013 - 12/18/2013 Chemotherapy TCH x 6 cycles   01/07/2014 -  Chemotherapy Herceptin x 52 weeks   01/27/2014 - 03/13/2014 Radiation Therapy By Dr. Pablo Ledger   03/29/2014 -  Chemotherapy Aromasin to start on 03/29/2014   11/05/2014 Imaging MUGA- Left ventricular ejection fraction equals 70%. No change from prior.    I personally reviewed and went over laboratory results with the patient.  The results are noted within this dictation.  She has lost 8 lbs in the past 3 months.  She has been trying to lose weight.  She notes that she has 1/2'd her portion size and therefore, she is dieting via portion control.  She looks excellent.  She feels good.   She saw Dr. Merlene Laughter for nerve conduction study and was negative.  Her right foot is improved after an injection is a suspected strained ligament.  She notes that it is painful when she first gets up, but improves significantly and quickly after ambulation.  Oncologically, she denies any complaints and ROS questioning is negative.  Past Medical History  Diagnosis Date  . GERD (gastroesophageal reflux disease)   . Hypertension   . Bronchitis 07/27/2011  . Barrett's esophagus   . Anxiety   . Breast cancer, left breast   . Osteopenia 10/09/2014    has GERD (gastroesophageal reflux disease); Hypertension; Barrett esophagus; Invasive ductal carcinoma metastasized to axillary lymph node, left; Peripheral neuropathy due to chemotherapy; Unstable balance; Stiffness of joint,  not elsewhere classified, ankle and foot; Ankle weakness; and Osteopenia on her problem list.     has No Known Allergies.  Heather Vaughan had no medications administered during this visit.  Past Surgical History  Procedure Laterality Date  .  Excision morton's neuroma  07/14/2011    Procedure: EXCISION MORTON'S NEUROMA;  Surgeon: Marcheta Grammes;  Location: [facility];  Service: Orthopedics;  Laterality: Right;  Removal Neuroma 3rd Interspace Right Foot  . Esophagogastroduodenoscopy  08/03/2011    Procedure: ESOPHAGOGASTRODUODENOSCOPY (EGD);  Surgeon: Rogene Houston, MD;  Location: [facility];  Service: Endoscopy;  Laterality: N/A;  12:45  . Partial mastectomy with needle localization Left 07/03/2013    Procedure: PARTIAL MASTECTOMY STATUS POST NEEDLE LOCALIZATION;  Surgeon: Scherry Ran, MD;  Location: [facility];  Service: General;  Laterality: Left;  . Breast biopsy Bilateral     X 4- benign  . Mastectomy modified radical Left 07/17/2013    Procedure: MASTECTOMY MODIFIED RADICAL WITH LEFT AXILLARY TISSUE REMOVAL;  Surgeon: Scherry Ran, MD;  Location: [facility];  Service: General;  Laterality: Left;  . Portacath placement Right 08/16/2013    Procedure: INSERTION PORT-A-CATH RIGHT SUBCLAVIAN;  Surgeon: Scherry Ran, MD;  Location: [facility];  Service: General;  Laterality: Right;    Denies any headaches, dizziness, double vision, fevers, chills, night sweats, nausea, vomiting, diarrhea, constipation, chest pain, heart palpitations, shortness of breath, blood in stool, black tarry stool, urinary pain, urinary burning, urinary frequency, hematuria.   PHYSICAL EXAMINATION  ECOG PERFORMANCE STATUS: 0 - Asymptomatic  Filed Vitals:   12/26/14 1352  BP: 127/71  Pulse: 68  Temp: 98.5 F (36.9 C)  Resp: 16    GENERAL:alert, healthy, no distress, well nourished, well developed, comfortable, cooperative and smiling SKIN: skin color, texture, turgor are normal, no rashes or significant lesions HEAD: Normocephalic, No masses, lesions, tenderness or abnormalities EYES: normal, PERRLA, EOMI, Conjunctiva are pink and non-injected EARS: External ears normal OROPHARYNX:lips, buccal mucosa, and tongue normal and mucous  membranes are moist  NECK: supple, no adenopathy, thyroid normal size, non-tender, without nodularity, no stridor, non-tender, trachea midline LYMPH:  no palpable lymphadenopathy BREAST:risk and benefit of breast self-exam was discussed LUNGS: clear to auscultation and percussion HEART: regular rate & rhythm, no murmurs, no gallops, S1 normal and S2 normal ABDOMEN:abdomen soft, non-tender, normal bowel sounds and no masses or organomegaly BACK: Back symmetric, no curvature., No CVA tenderness EXTREMITIES:less then 2 second capillary refill, no joint deformities, effusion, or inflammation, no edema, no skin discoloration, no clubbing, no cyanosis  NEURO: alert & oriented x 3 with fluent speech, no focal motor/sensory deficits, gait normal   LABORATORY DATA: CBC    Component Value Date/Time   WBC 4.4 08/28/2014 1001   RBC 3.38* 08/28/2014 1001   RBC 2.41* 01/13/2014 1000   HGB 11.5* 08/28/2014 1001   HCT 35.1* 08/28/2014 1001   PLT 183 08/28/2014 1001   MCV 103.8* 08/28/2014 1001   MCH 34.0 08/28/2014 1001   MCHC 32.8 08/28/2014 1001   RDW 11.9 08/28/2014 1001   LYMPHSABS 1.4 08/28/2014 1001   MONOABS 0.3 08/28/2014 1001   EOSABS 0.2 08/28/2014 1001   BASOSABS 0.0 08/28/2014 1001      Chemistry      Component Value Date/Time   NA 137 10/09/2014 0830   K 3.3* 10/09/2014 0830   CL 105 10/09/2014 0830   CO2 28 10/09/2014 0830   BUN 15 10/09/2014 0830   CREATININE 0.66 10/09/2014 0830  Component Value Date/Time   CALCIUM 8.9 10/09/2014 0830   ALKPHOS 80 10/09/2014 0830   AST 16 10/09/2014 0830   ALT 14 10/09/2014 0830   BILITOT 0.5 10/09/2014 0830       ASSESSMENT AND PLAN:  Invasive ductal carcinoma metastasized to axillary lymph node, left Invasive ductal carcinoma of left breast with high grade DCIS. S/P left partial mastectomy on 07/03/2013 demonstrating a 1.4 cm invasive component that was ER 74%, PR 16%, Ki-67 52%, and HER2 negative. However, positive margins  were noted and therefore on 07/17/2013 she underwent a left modified radical mastectomy and left axillary lymph node dissection. Clear margins were ascertained with 0/81 positive lymph nodes positive for metastatic disease. Testing on the positive lymph node reveals HER2 positivity. OncoType DX score was 29 placing her in the high-end of the intermediate risk group for recurrence. She was therefore started on [facility] therapy on 08/27/2013 and finished TC on 12/18/2013 after 6 cycles. She is S/P radiation therapy by Dr. Pablo Ledger on 01/27/2014- 03/14/2014. She is S/P completion of 52 weeks worth of Herceptin therapy.  Now on Aromasin (beginning on on 03/29/2014).    Continue Aromasin daily.  Compliance encouraged.   Labs today: CBC diff, CMET.  Labs in 3 months: CBC diff, CMET  I have refilled her Aromasin daily.  I have refilled her EMLA cream.  I will await her K+ results before refilling her Kdur.  If she becomes Hypokalemic in the future, we will defer treatment to her primary care provider as it is no longer treatment-induced and is most likely secondary to HCTZ requirements.  Return in 3 months for follow-up.    Osteopenia On Ca++, Vit D, and Prolia every 6 months.     THERAPY PLAN:  NCCN guidelines recommends the following surveillance for invasive breast cancer:  A. History and Physical exam every 4-6 months for 5 years and then every 12 months.  B. Mammography every 12 months  C. Women on Tamoxifen: annual gynecologic assessment every 12 months if uterus is present.  D. Women on aromatase inhibitor or who experience ovarian failure secondary to treatment should have monitoring of bone health with a bone mineral density determination at baseline and periodically thereafter.  E. Assess and encourage adherence to adjuvant endocrine therapy.  F. Evidence suggests that active lifestyle and achieving and maintaining an ideal body weight (20-25 BMI) may lead to optimal breast cancer  outcomes.   All questions were answered. The patient knows to call the clinic with any problems, questions or concerns. We can certainly see the patient much sooner if necessary.  Patient and plan discussed with Dr. Ancil Linsey and she is in agreement with the aforementioned.   This note is electronically signed by: Robynn Pane 12/26/2014 2:18 PM

## 2014-12-26 NOTE — Assessment & Plan Note (Signed)
On Ca++, Vit D, and Prolia every 6 months.

## 2014-12-26 NOTE — Progress Notes (Signed)
Heather Vaughan presented for Portacath access and flush.  Portacath located right chest wall accessed with  H 20 needle.  Good blood return present. Portacath flushed with 27ml NS and 500U/51ml Heparin and needle removed intact.  Procedure tolerated well and without incident.  See office visit encounter for further information.

## 2015-01-01 ENCOUNTER — Encounter (HOSPITAL_COMMUNITY): Payer: 59

## 2015-01-08 ENCOUNTER — Encounter (HOSPITAL_COMMUNITY): Payer: 59

## 2015-02-12 ENCOUNTER — Encounter (HOSPITAL_COMMUNITY): Payer: 59 | Attending: Hematology and Oncology

## 2015-02-12 ENCOUNTER — Encounter (HOSPITAL_COMMUNITY): Payer: Self-pay

## 2015-02-12 VITALS — BP 117/54 | HR 72 | Temp 98.6°F | Resp 18

## 2015-02-12 DIAGNOSIS — C50912 Malignant neoplasm of unspecified site of left female breast: Secondary | ICD-10-CM | POA: Insufficient documentation

## 2015-02-12 DIAGNOSIS — Z452 Encounter for adjustment and management of vascular access device: Secondary | ICD-10-CM | POA: Diagnosis not present

## 2015-02-12 DIAGNOSIS — Z95828 Presence of other vascular implants and grafts: Secondary | ICD-10-CM

## 2015-02-12 DIAGNOSIS — C50919 Malignant neoplasm of unspecified site of unspecified female breast: Secondary | ICD-10-CM | POA: Insufficient documentation

## 2015-02-12 DIAGNOSIS — C773 Secondary and unspecified malignant neoplasm of axilla and upper limb lymph nodes: Secondary | ICD-10-CM | POA: Insufficient documentation

## 2015-02-12 MED ORDER — HEPARIN SOD (PORK) LOCK FLUSH 100 UNIT/ML IV SOLN
500.0000 [IU] | Freq: Once | INTRAVENOUS | Status: AC
  Administered 2015-02-12: 500 [IU] via INTRAVENOUS
  Filled 2015-02-12: qty 5

## 2015-02-12 MED ORDER — SODIUM CHLORIDE 0.9 % IJ SOLN
10.0000 mL | INTRAMUSCULAR | Status: DC | PRN
  Administered 2015-02-12: 10 mL via INTRAVENOUS
  Filled 2015-02-12: qty 10

## 2015-02-12 NOTE — Progress Notes (Signed)
Heather Vaughan presented for Portacath access and flush. Portacath located right chest wall accessed with  H 20 needle.  Good blood return present. Portacath flushed with 25ml NS and 500U/62ml Heparin and needle removed intact.  Procedure tolerated well and without incident.

## 2015-02-12 NOTE — Patient Instructions (Signed)
[facility] Discharge Instructions  RECOMMENDATIONS MADE BY THE CONSULTANT AND ANY TEST RESULTS WILL BE SENT TO YOUR REFERRING PHYSICIAN.  Port flush today Follow up as scheduled Please call the clinic if you have any questions or concerns  Thank you for choosing [facility] to provide your oncology and hematology care.  To afford each patient quality time with our provider, please arrive at least 15 minutes before your scheduled appointment time.    You need to re-schedule your appointment should you arrive 10 or more minutes late.  We strive to give you quality time with our providers, and arriving late affects you and other patients whose appointments are after yours.  Also, if you no show three or more times for appointments you may be dismissed from the clinic at the providers discretion.     Again, thank you for choosing [facility].  Our hope is that these requests will decrease the amount of time that you wait before being seen by our physicians.       _____________________________________________________________  Should you have questions after your visit to [facility], please contact our office at [phone] between the hours of 8:30 a.m. and 4:30 p.m.  Voicemails left after 4:30 p.m. will not be returned until the following business day.  For prescription refill requests, have your pharmacy contact our office.    

## 2015-03-26 ENCOUNTER — Encounter (HOSPITAL_COMMUNITY): Payer: Self-pay | Admitting: Hematology & Oncology

## 2015-03-26 ENCOUNTER — Encounter (HOSPITAL_COMMUNITY): Payer: 59 | Attending: Hematology & Oncology

## 2015-03-26 ENCOUNTER — Encounter (HOSPITAL_COMMUNITY): Payer: 59 | Attending: Hematology and Oncology | Admitting: Hematology & Oncology

## 2015-03-26 VITALS — BP 98/54 | HR 68 | Temp 99.0°F | Resp 14 | Wt 123.1 lb

## 2015-03-26 DIAGNOSIS — C50912 Malignant neoplasm of unspecified site of left female breast: Secondary | ICD-10-CM | POA: Diagnosis present

## 2015-03-26 DIAGNOSIS — Z452 Encounter for adjustment and management of vascular access device: Secondary | ICD-10-CM | POA: Diagnosis not present

## 2015-03-26 DIAGNOSIS — C50919 Malignant neoplasm of unspecified site of unspecified female breast: Secondary | ICD-10-CM | POA: Diagnosis present

## 2015-03-26 DIAGNOSIS — C773 Secondary and unspecified malignant neoplasm of axilla and upper limb lymph nodes: Secondary | ICD-10-CM | POA: Insufficient documentation

## 2015-03-26 DIAGNOSIS — Z79811 Long term (current) use of aromatase inhibitors: Secondary | ICD-10-CM | POA: Diagnosis not present

## 2015-03-26 DIAGNOSIS — R918 Other nonspecific abnormal finding of lung field: Secondary | ICD-10-CM

## 2015-03-26 DIAGNOSIS — Z170 Estrogen receptor positive status [ER+]: Secondary | ICD-10-CM | POA: Diagnosis not present

## 2015-03-26 DIAGNOSIS — Z139 Encounter for screening, unspecified: Secondary | ICD-10-CM

## 2015-03-26 LAB — CBC WITH DIFFERENTIAL/PLATELET
Basophils Absolute: 0 10*3/uL (ref 0.0–0.1)
Basophils Relative: 1 % (ref 0–1)
EOS PCT: 4 % (ref 0–5)
Eosinophils Absolute: 0.2 10*3/uL (ref 0.0–0.7)
HEMATOCRIT: 35.1 % — AB (ref 36.0–46.0)
HEMOGLOBIN: 11.7 g/dL — AB (ref 12.0–15.0)
Lymphocytes Relative: 31 % (ref 12–46)
Lymphs Abs: 1.8 10*3/uL (ref 0.7–4.0)
MCH: 35 pg — AB (ref 26.0–34.0)
MCHC: 33.3 g/dL (ref 30.0–36.0)
MCV: 105.1 fL — ABNORMAL HIGH (ref 78.0–100.0)
Monocytes Absolute: 0.4 10*3/uL (ref 0.1–1.0)
Monocytes Relative: 6 % (ref 3–12)
NEUTROS ABS: 3.3 10*3/uL (ref 1.7–7.7)
Neutrophils Relative %: 58 % (ref 43–77)
PLATELETS: 201 10*3/uL (ref 150–400)
RBC: 3.34 MIL/uL — AB (ref 3.87–5.11)
RDW: 12.4 % (ref 11.5–15.5)
WBC: 5.7 10*3/uL (ref 4.0–10.5)

## 2015-03-26 LAB — COMPREHENSIVE METABOLIC PANEL
ALT: 15 U/L (ref 14–54)
AST: 16 U/L (ref 15–41)
Albumin: 4 g/dL (ref 3.5–5.0)
Alkaline Phosphatase: 62 U/L (ref 38–126)
Anion gap: 9 (ref 5–15)
BILIRUBIN TOTAL: 0.5 mg/dL (ref 0.3–1.2)
BUN: 15 mg/dL (ref 6–20)
CO2: 27 mmol/L (ref 22–32)
Calcium: 8.8 mg/dL — ABNORMAL LOW (ref 8.9–10.3)
Chloride: 102 mmol/L (ref 101–111)
Creatinine, Ser: 0.75 mg/dL (ref 0.44–1.00)
GFR calc Af Amer: >60 mL/min (ref >60)
GFR calc non Af Amer: >60 mL/min (ref >60)
Glucose, Bld: 104 mg/dL — ABNORMAL HIGH (ref 65–99)
Potassium: 3.1 mmol/L — ABNORMAL LOW (ref 3.5–5.1)
SODIUM: 138 mmol/L (ref 135–145)
Total Protein: 6.5 g/dL (ref 6.5–8.1)

## 2015-03-26 MED ORDER — SODIUM CHLORIDE 0.9 % IJ SOLN
10.0000 mL | INTRAMUSCULAR | Status: DC | PRN
  Administered 2015-03-26: 10 mL via INTRAVENOUS
  Filled 2015-03-26: qty 10

## 2015-03-26 MED ORDER — HEPARIN SOD (PORK) LOCK FLUSH 100 UNIT/ML IV SOLN
500.0000 [IU] | Freq: Once | INTRAVENOUS | Status: AC
  Administered 2015-03-26: 500 [IU] via INTRAVENOUS
  Filled 2015-03-26: qty 5

## 2015-03-26 NOTE — Progress Notes (Signed)
Heather Vaughan, [place] [address]   DIAGNOSIS: Invasive ductal carcinoma metastasized to axillary lymph node, left   Staging form: Breast, AJCC 7th Edition     Clinical: Stage IIA (T1c, N1, cM0) - Signed by Baird Cancer, PA-C on 09/15/2013   SUMMARY OF ONCOLOGIC HISTORY:   Invasive ductal carcinoma metastasized to axillary lymph node, left   05/28/2013 Imaging Screening mammogram   06/19/2013 Imaging Left diagnostic mammogram   06/25/2013 Imaging CT CAP- borderline enlarged axillary lymph node on left.  Small pulmonary nodules noted but too small for PET or biopsy; surveillance recommended.    07/03/2013 Surgery Left partial mastectomy.  Invasive ductal carcinoma. 1.4 cm.  POSITIVE MARGINS.  Additional extensive DCIS noted.  ER 74%, PR 16%, Ki-67 52%, HER2 NEGATIVE.   07/17/2013 Surgery Left modified radiacl mastectomy with left axillary node dissection.  Negative residual cancer.  Negative inked margins.  1/12 positive lymph nodes.  FINAL MARGINS NEGATIVE.  LYMPH NODE IS HER2 POSITIVE.   08/13/2013 Imaging MUGA scan shows left ventricular EF of 62%   08/27/2013 - 12/18/2013 Chemotherapy TCH x 6 cycles   01/07/2014 -  Chemotherapy Herceptin x 52 weeks   01/27/2014 - 03/13/2014 Radiation Therapy By Dr. Pablo Ledger   03/29/2014 -  Chemotherapy Aromasin to start on 03/29/2014   11/05/2014 Imaging MUGA- Left ventricular ejection fraction equals 70%. No change from prior.    CURRENT THERAPY: Aromasin (beginning on on 03/29/2014).   INTERVAL HISTORY: Heather Vaughan 59 y.o. female returns for follow-up of her ER+, HER-2 neu + breast cancer.   She takes her aromasin daily.   She is here alone today and feeling well. She is eating and sleeping well. She has been staying busy, working 12 hours a day. She feels fatigued sometimes. She saw Dr. Merlene Laughter recently about her foot, finding that she did not have any nerve damage. She received an injection that has helped.  Sometimes her foot still bothers her when she first gets up but the pain soon goes away. She denies any new aches or pains. She is concerned about her recent weight loss, realizing it may be due to her increased activity since treatment. Her weight is down almost 30 pounds since the fall of last year. She had her two front teeth removed about a month ago. She was sure to tell them she has "bone shots." She now has a partial plate.  She is up to date on her mammograms, with her last one performed in November 2015.   MEDICAL HISTORY: Past Medical History  Diagnosis Date  . GERD (gastroesophageal reflux disease)   . Hypertension   . Bronchitis 07/27/2011  . Barrett's esophagus   . Anxiety   . Breast cancer, left breast   . Osteopenia 10/09/2014    has GERD (gastroesophageal reflux disease); Hypertension; Barrett esophagus; Invasive ductal carcinoma metastasized to axillary lymph node, left; Peripheral neuropathy due to chemotherapy; Unstable balance; Stiffness of joint, not elsewhere classified, ankle and foot; Ankle weakness; and Osteopenia on her problem list.     has No Known Allergies.  We administered heparin lock flush and sodium chloride.  SURGICAL HISTORY: Past Surgical History  Procedure Laterality Date  . Excision morton's neuroma  07/14/2011    Procedure: EXCISION MORTON'S NEUROMA;  Surgeon: Marcheta Grammes;  Location: [facility];  Service: Orthopedics;  Laterality: Right;  Removal Neuroma 3rd Interspace Right Foot  . Esophagogastroduodenoscopy  08/03/2011    Procedure: ESOPHAGOGASTRODUODENOSCOPY (EGD);  Surgeon:  Rogene Houston, MD;  Location[facility];  Service: Endoscopy;  Laterality: N/A;  12:45  . Partial mastectomy with needle localization Left 07/03/2013    Procedure: PARTIAL MASTECTOMY STATUS POST NEEDLE LOCALIZATION;  Surgeon: Scherry Ran, MD;  Location: [facility];  Service: General;  Laterality: Left;  . Breast biopsy Bilateral     X 4- benign  .  Mastectomy modified radical Left 07/17/2013    Procedure: MASTECTOMY MODIFIED RADICAL WITH LEFT AXILLARY TISSUE REMOVAL;  Surgeon: Scherry Ran, MD;  Location: [facility];  Service: General;  Laterality: Left;  . Portacath placement Right 08/16/2013    Procedure: INSERTION PORT-A-CATH RIGHT SUBCLAVIAN;  Surgeon: Scherry Ran, MD;  Location: [facility];  Service: General;  Laterality: Right;    SOCIAL HISTORY: Social History   Social History  . Marital Status: Married    Spouse Name: N/A  . Number of Children: N/A  . Years of Education: N/A   Occupational History  . Not on file.   Social History Main Topics  . Smoking status: Former Smoker -- 1.00 packs/day for 20 years    Types: Cigarettes    Quit date: 09/05/1993  . Smokeless tobacco: Never Used  . Alcohol Use: No     Comment: occasionally  . Drug Use: No  . Sexual Activity: Not on file   Other Topics Concern  . Not on file   Social History Narrative    FAMILY HISTORY: Family History  Problem Relation Age of Onset  . Anesthesia problems Neg Hx   . Hypotension Neg Hx   . Malignant hyperthermia Neg Hx   . Pseudochol deficiency Neg Hx     Review of Systems  Constitutional: Negative.   HENT: Negative.   Eyes: Negative.   Respiratory: Negative.   Cardiovascular: Negative.   Gastrointestinal: Negative.   Genitourinary: Negative.   Musculoskeletal:       Foot pain improved, now only noticed when she first gets up. Skin: Negative.   Neurological: Negative.  Endo/Heme/Allergies: Negative.   Psychiatric/Behavioral: Negative.   14 point review of systems was performed and is negative except as detailed under history of present illness and above   PHYSICAL EXAMINATION  ECOG PERFORMANCE STATUS: 0 - Asymptomatic  Filed Vitals:   03/26/15 1331  BP: 98/54  Pulse: 68  Temp: 99 F (37.2 C)  Resp: 14    Physical Exam  Constitutional: She is oriented to person, place, and time and well-developed,  well-nourished, and in no distress.  HENT:  Head: Normocephalic and atraumatic.  Nose: Nose normal.  Mouth/Throat: Oropharynx is clear and moist. No oropharyngeal exudate.  Eyes: Conjunctivae and EOM are normal. Pupils are equal, round, and reactive to light. Right eye exhibits no discharge. Left eye exhibits no discharge. No scleral icterus.  Neck: Normal range of motion. Neck supple. No tracheal deviation present. No thyromegaly present.  Cardiovascular: Normal rate, regular rhythm and normal heart sounds.  Exam reveals no gallop and no friction rub.   No murmur heard. Pulmonary/Chest: Effort normal and breath sounds normal. She has no wheezes. She has no rales.  Left chest wall without nodularity or palpable abnormality. Both axillae clear. Right breast is normal with no palpable masses.  Abdominal: Soft. Bowel sounds are normal. She exhibits no distension and no mass. There is no tenderness. There is no rebound and no guarding.  Musculoskeletal: Normal range of motion. She exhibits no edema.  Lymphadenopathy:    She has no cervical adenopathy.  Neurological:  She is alert and oriented to person, place, and time. She has normal reflexes. No cranial nerve deficit. Gait normal. Coordination normal.  Skin: Skin is warm and dry. No rash noted.  Psychiatric: Mood, memory, affect and judgment normal.  Nursing note and vitals reviewed.   LABORATORY DATA:  CBC    Component Value Date/Time   WBC 5.7 03/26/2015 1430   RBC 3.34* 03/26/2015 1430   RBC 2.41* 01/13/2014 1000   HGB 11.7* 03/26/2015 1430   HCT 35.1* 03/26/2015 1430   PLT 201 03/26/2015 1430   MCV 105.1* 03/26/2015 1430   MCH 35.0* 03/26/2015 1430   MCHC 33.3 03/26/2015 1430   RDW 12.4 03/26/2015 1430   LYMPHSABS 1.8 03/26/2015 1430   MONOABS 0.4 03/26/2015 1430   EOSABS 0.2 03/26/2015 1430   BASOSABS 0.0 03/26/2015 1430   CMP     Component Value Date/Time   NA 138 03/26/2015 1430   K 3.1* 03/26/2015 1430   CL 102  03/26/2015 1430   CO2 27 03/26/2015 1430   GLUCOSE 104* 03/26/2015 1430   BUN 15 03/26/2015 1430   CREATININE 0.75 03/26/2015 1430   CALCIUM 8.8* 03/26/2015 1430   PROT 6.5 03/26/2015 1430   ALBUMIN 4.0 03/26/2015 1430   AST 16 03/26/2015 1430   ALT 15 03/26/2015 1430   ALKPHOS 62 03/26/2015 1430   BILITOT 0.5 03/26/2015 1430   GFRNONAA >60 03/26/2015 1430   GFRAA >60 03/26/2015 1430       ASSESSMENT and THERAPY PLAN: Stage IIa ER positive HER-2 positive carcinoma the left breast  Pulmonary nodules seen on CT imaging with ongoing CT surveillance Weight loss   She will be set up for an EGD with Dr. Laural Golden giving her ongoing nausea. I advised her that since this affects her appetite it may be the cause of her weight loss. We reviewed her weights over the past year. If GI eval if negative we may need to consider PET imaging.  Dr. Romona Curls put in her port. She is requesting port removal.  I advised her I prefer that she keep it in but she feels it causes her discomfort. We have referred her to Dr. Romona Curls.  We have ordered a routine mammogram for this November. Advised for her to begin taking calcium and Vitamin D supplements regularly. We will schedule for a chest CT in October for ongoing surveillance of the pulmonary nodules. We will schedule for a routine follow up in 3 months.  Orders Placed This Encounter  Procedures  . MM Digital Screening Unilat R    Standing Status: Future     Number of Occurrences:      Standing Expiration Date: 03/25/2016    Order Specific Question:  Reason for Exam (SYMPTOM  OR DIAGNOSIS REQUIRED)    Answer:  screening, history breast ca    Order Specific Question:  Is the patient pregnant?    Answer:  No    Order Specific Question:  Preferred imaging location?    Answer:  [facility]  . CT Chest W Contrast    Standing Status: Future     Number of Occurrences:      Standing Expiration Date: 03/25/2016    Order Specific Question:   Reason for Exam (SYMPTOM  OR DIAGNOSIS REQUIRED)    Answer:  screening, history breast ca    Order Specific Question:  Is the patient pregnant?    Answer:  No    Order Specific Question:  Preferred imaging location?  Answer:  [facility]  . Vitamin D 25 hydroxy    Standing Status: Future     Number of Occurrences: 1     Standing Expiration Date: 03/25/2016  . Schedule Portacath Flush Appointment    Schedule Portacath flush appointment    All questions were answered. The patient knows to call the clinic with any problems, questions or concerns. We can certainly see the patient much sooner if necessary.  This document serves as a record of services personally performed by Ancil Linsey, MD. It was created on her behalf by Arlyce Harman, a trained medical scribe. The creation of this record is based on the scribe's personal observations and the provider's statements to them. This document has been checked and approved by the attending provider.  I have reviewed the above documentation for accuracy and completeness, and I agree with the above.  Molli Hazard, MD  04/24/2015

## 2015-03-26 NOTE — Patient Instructions (Addendum)
[place] at [facility] Discharge Instructions  RECOMMENDATIONS MADE BY THE CONSULTANT AND ANY TEST RESULTS WILL BE SENT TO YOUR REFERRING PHYSICIAN.  Exam and discussion by Dr. Whitney Muse. Take Calcium 600 mg and Vitamin D 1000 units twice daily You can get your port removed Will make referral to Dr. Laural Golden for follow-up of abnormal CT of chest with thickening of stomach wall. Will get CT of your chest in October and Mammogram in November. Report any new lumps, bone pain, shortness of breath or other symptoms.  Follow-up in 3 months with office visit.   Thank you for choosing [place] at [facility] to provide your oncology and hematology care.  To afford each patient quality time with our provider, please arrive at least 15 minutes before your scheduled appointment time.    You need to re-schedule your appointment should you arrive 10 or more minutes late.  We strive to give you quality time with our providers, and arriving late affects you and other patients whose appointments are after yours.  Also, if you no show three or more times for appointments you may be dismissed from the clinic at the providers discretion.     Again, thank you for choosing [facility].  Our hope is that these requests will decrease the amount of time that you wait before being seen by our physicians.       _____________________________________________________________  Should you have questions after your visit to [facility], please contact our office at [phone] between the hours of 8:30 a.m. and 4:30 p.m.  Voicemails left after 4:30 p.m. will not be returned until the following business day.  For prescription refill requests, have your pharmacy contact our office.

## 2015-03-26 NOTE — Progress Notes (Signed)
Please see office visit encounter.

## 2015-03-26 NOTE — Progress Notes (Signed)
Heather Vaughan presented for labwork. Labs per MD order drawn via Portacath located in the right chest wall accessed with  H 20 needle. Good blood return present. Procedure without incident.  Needle removed intact. Patient tolerated procedure well.

## 2015-03-27 ENCOUNTER — Other Ambulatory Visit (HOSPITAL_COMMUNITY): Payer: Self-pay | Admitting: Oncology

## 2015-03-27 DIAGNOSIS — E876 Hypokalemia: Secondary | ICD-10-CM

## 2015-03-27 LAB — VITAMIN D 25 HYDROXY (VIT D DEFICIENCY, FRACTURES): Vit D, 25-Hydroxy: 22.3 ng/mL — ABNORMAL LOW (ref 30.0–100.0)

## 2015-03-27 MED ORDER — POTASSIUM CHLORIDE CRYS ER 20 MEQ PO TBCR: 40.0000 meq | EXTENDED_RELEASE_TABLET | Freq: Every day | ORAL | Status: DC

## 2015-03-31 ENCOUNTER — Other Ambulatory Visit (HOSPITAL_COMMUNITY): Payer: Self-pay

## 2015-03-31 MED ORDER — ERGOCALCIFEROL 1.25 MG (50000 UT) PO CAPS: ORAL_CAPSULE | ORAL | Status: DC

## 2015-04-01 ENCOUNTER — Encounter (INDEPENDENT_AMBULATORY_CARE_PROVIDER_SITE_OTHER): Payer: Self-pay | Admitting: *Deleted

## 2015-04-09 ENCOUNTER — Other Ambulatory Visit (HOSPITAL_COMMUNITY): Payer: 59

## 2015-04-09 ENCOUNTER — Encounter (HOSPITAL_COMMUNITY): Payer: 59 | Attending: Hematology and Oncology

## 2015-04-09 ENCOUNTER — Encounter (HOSPITAL_COMMUNITY): Payer: Self-pay

## 2015-04-09 VITALS — BP 109/62 | HR 76 | Temp 98.2°F | Resp 16

## 2015-04-09 DIAGNOSIS — C50912 Malignant neoplasm of unspecified site of left female breast: Secondary | ICD-10-CM | POA: Insufficient documentation

## 2015-04-09 DIAGNOSIS — M8580 Other specified disorders of bone density and structure, unspecified site: Secondary | ICD-10-CM | POA: Diagnosis not present

## 2015-04-09 DIAGNOSIS — C50919 Malignant neoplasm of unspecified site of unspecified female breast: Secondary | ICD-10-CM | POA: Insufficient documentation

## 2015-04-09 DIAGNOSIS — C773 Secondary and unspecified malignant neoplasm of axilla and upper limb lymph nodes: Secondary | ICD-10-CM | POA: Insufficient documentation

## 2015-04-09 MED ORDER — DENOSUMAB 60 MG/ML ~~LOC~~ SOLN
60.0000 mg | Freq: Once | SUBCUTANEOUS | Status: AC
  Administered 2015-04-09: 60 mg via SUBCUTANEOUS
  Filled 2015-04-09: qty 1

## 2015-04-09 NOTE — Progress Notes (Signed)
Heather Vaughan presents today for injection per the provider's orders.  Prolia administration without incident; see MAR for injection details.  Patient tolerated procedure well and without incident.  No questions or complaints noted at this time.

## 2015-04-09 NOTE — Patient Instructions (Signed)
[facility] at [facility] Discharge Instructions  RECOMMENDATIONS MADE BY THE CONSULTANT AND ANY TEST RESULTS WILL BE SENT TO YOUR REFERRING PHYSICIAN.  Prolia injection today. Return as scheduled for office visit.  Thank you for choosing [facility] at [facility] to provide your oncology and hematology care.  To afford each patient quality time with our provider, please arrive at least 15 minutes before your scheduled appointment time.    You need to re-schedule your appointment should you arrive 10 or more minutes late.  We strive to give you quality time with our providers, and arriving late affects you and other patients whose appointments are after yours.  Also, if you no show three or more times for appointments you may be dismissed from the clinic at the providers discretion.     Again, thank you for choosing [facility].  Our hope is that these requests will decrease the amount of time that you wait before being seen by our physicians.       _____________________________________________________________  Should you have questions after your visit to [facility], please contact our office at [phone] between the hours of 8:30 a.m. and 4:30 p.m.  Voicemails left after 4:30 p.m. will not be returned until the following business day.  For prescription refill requests, have your pharmacy contact our office.    

## 2015-04-24 ENCOUNTER — Encounter (HOSPITAL_COMMUNITY): Payer: Self-pay | Admitting: Hematology & Oncology

## 2015-04-24 NOTE — H&P (Signed)
  NTS SOAP Note  Vital Signs:  Vitals as of: 0/02/1218: Systolic 758: Diastolic 77: Heart Rate 67: Temp 98.76F: Height 86ft 2in: Weight 125Lbs 0 Ounces: BMI 22.86  BMI : 22.86 kg/m2  Subjective: This 58 year old female presents for of need for portacath removal.  Is finished with chemotherapy for breast cancer.  Review of Symptoms:  Constitutional:unremarkable   Head:unremarkable Eyes:unremarkable   Nose/Mouth/Throat:unremarkable Cardiovascular:  unremarkable Respiratory:unremarkable Gastrointestinal:    heartburn Genitourinary:unremarkable   Musculoskeletal:unremarkable Skin:unremarkable Hematolgic/Lymphatic:unremarkable   Allergic/Immunologic:unremarkable   Past Medical History:  Reviewed  Past Medical History  Surgical History: Left MRM, port placement 2014 Medical Problems: GERD, HTN, left breast cancer, anxiety Allergies: nkda Medications: losartan/HCTZ, omeprazole, duloxetine, kdur, exemestan, trazadone   Social History:Reviewed  Social History  Preferred Language: English Race:  White Ethnicity: Not Hispanic / Latino Age: 89 year Marital Status:  M Alcohol: no   Smoking Status: Never smoker reviewed on 04/23/2015 Functional Status reviewed on 04/23/2015 ------------------------------------------------ Bathing: Normal Cooking: Normal Dressing: Normal Driving: Normal Eating: Normal Managing Meds: Normal Oral Care: Normal Shopping: Normal Toileting: Normal Transferring: Normal Walking: Normal Cognitive Status reviewed on 04/23/2015 ------------------------------------------------ Attention: Normal Decision Making: Normal Language: Normal Memory: Normal Motor: Normal Perception: Normal Problem Solving: Normal Visual and Spatial: Normal   Family History:Reviewed  Family Health History Family History is Unknown    Objective Information: General:Well appearing, well nourished in no distress. port in place right  upper chest Heart:RRR, no murmur Lungs:  CTA bilaterally, no wheezes, rhonchi, rales.  Breathing unlabored.  Assessment:Left breast carcinoma, finished with chemotherapy  Diagnoses: 174.9  C50.912 Primary malignant neoplasm of female breast (Malignant neoplasm of unspecified site of left female breast)  Procedures: [phone] - OFFICE OUTPATIENT NEW 20 MINUTES    Plan:  Scheduled for portacath removal in the minor procedure room on 04/27/15.   Patient Education:Alternative treatments to surgery were discussed with patient (and family).  Risks and benefits  of procedure were fully explained to the patient (and family) who gave informed consent. Patient/family questions were addressed.  Follow-up:Pending Surgery

## 2015-04-27 ENCOUNTER — Ambulatory Visit (HOSPITAL_COMMUNITY)
Admission: RE | Admit: 2015-04-27 | Discharge: 2015-04-27 | Disposition: A | Discharge status: Home / Self Care | Payer: 59 | Source: Ambulatory Visit | Attending: General Surgery | Admitting: General Surgery

## 2015-04-27 ENCOUNTER — Encounter (HOSPITAL_COMMUNITY): Payer: Self-pay | Admitting: *Deleted

## 2015-04-27 ENCOUNTER — Encounter (HOSPITAL_COMMUNITY)
Admission: RE | Disposition: A | Discharge status: Home / Self Care | Payer: Self-pay | Source: Ambulatory Visit | Attending: General Surgery

## 2015-04-27 ENCOUNTER — Ambulatory Visit (INDEPENDENT_AMBULATORY_CARE_PROVIDER_SITE_OTHER): Payer: 59 | Admitting: Internal Medicine

## 2015-04-27 DIAGNOSIS — Z9221 Personal history of antineoplastic chemotherapy: Secondary | ICD-10-CM | POA: Diagnosis not present

## 2015-04-27 DIAGNOSIS — Z79899 Other long term (current) drug therapy: Secondary | ICD-10-CM | POA: Insufficient documentation

## 2015-04-27 DIAGNOSIS — F419 Anxiety disorder, unspecified: Secondary | ICD-10-CM | POA: Diagnosis not present

## 2015-04-27 DIAGNOSIS — C50912 Malignant neoplasm of unspecified site of left female breast: Secondary | ICD-10-CM | POA: Insufficient documentation

## 2015-04-27 DIAGNOSIS — K219 Gastro-esophageal reflux disease without esophagitis: Secondary | ICD-10-CM | POA: Diagnosis not present

## 2015-04-27 DIAGNOSIS — I10 Essential (primary) hypertension: Secondary | ICD-10-CM | POA: Diagnosis not present

## 2015-04-27 DIAGNOSIS — Z452 Encounter for adjustment and management of vascular access device: Secondary | ICD-10-CM | POA: Insufficient documentation

## 2015-04-27 HISTORY — PX: PORT-A-CATH REMOVAL: SHX5289

## 2015-04-27 SURGERY — REMOVAL PORT-A-CATH
Anesthesia: LOCAL | Laterality: Right

## 2015-04-27 MED ORDER — LIDOCAINE HCL (PF) 1 % IJ SOLN
INTRAMUSCULAR | Status: DC | PRN
  Administered 2015-04-27: 10 mL

## 2015-04-27 MED ORDER — LIDOCAINE HCL (PF) 1 % IJ SOLN: 30.0000 mL | Freq: Once | INTRAMUSCULAR | Status: DC

## 2015-04-27 MED ORDER — LIDOCAINE HCL (PF) 1 % IJ SOLN
INTRAMUSCULAR | Status: AC
  Filled 2015-04-27: qty 30

## 2015-04-27 SURGICAL SUPPLY — 12 items
CLOTH BEACON ORANGE TIMEOUT ST (SAFETY) ×2 IMPLANT
DRAPE PROXIMA HALF (DRAPES) ×2 IMPLANT
DURAPREP 6ML APPLICATOR 50/CS (WOUND CARE) ×2 IMPLANT
ELECT REM PT RETURN 9FT ADLT (ELECTROSURGICAL) ×2
ELECTRODE REM PT RTRN 9FT ADLT (ELECTROSURGICAL) ×1 IMPLANT
GLOVE SURG SS PI 7.5 STRL IVOR (GLOVE) ×2 IMPLANT
GOWN STRL REUS W/TWL LRG LVL3 (GOWN DISPOSABLE) ×2 IMPLANT
LIQUID BAND (GAUZE/BANDAGES/DRESSINGS) ×2 IMPLANT
SPONGE GAUZE 2X2 8PLY STRL LF (GAUZE/BANDAGES/DRESSINGS) ×2 IMPLANT
SUT VIC AB 3-0 SH 27 (SUTURE) ×1
SUT VIC AB 3-0 SH 27X BRD (SUTURE) ×1 IMPLANT
SUT VIC AB 4-0 PS2 27 (SUTURE) ×2 IMPLANT

## 2015-04-27 NOTE — Op Note (Signed)
Patient:  Heather Vaughan  DOB:  18-Jan-1956  MRN:  [id number]   Preop Diagnosis:  Left breast carcinoma, finished with chemotherapy  Postop Diagnosis:  Same  Procedure:  Port-A-Cath removal  Surgeon:  Aviva Signs, M.D.  Anes:  Local  Indications:  Patient is a 59 year old white female who presents for Port-A-Cath removal as she is finished with chemotherapy for left breast carcinoma. The risks and benefits of the procedure were fully explained patient, who gave informed consent.  Procedure note:  The patient was placed the supine position. The right upper chest was prepped and draped using usual sterile technique with DuraPrep. Surgical site confirmation was performed. 1% Xylocaine was used for local anesthesia.  An incision was made to the previous surgical incision site. The dissection was taken down to the port. The port was removed in total without difficulty. It was disposed of. The subcutaneous layer was reapproximated using 3-0 Vicryl interrupted suture. The skin was closed using a 4-0 Vicryl subcuticular suture. Dermabond was applied.  All tape and needle counts were correct at the end of the procedure. The patient tolerated the procedure well.   Complications:  None  EBL:  None  Specimen:  None

## 2015-04-27 NOTE — Interval H&P Note (Signed)
History and Physical Interval Note:  04/27/2015 9:13 AM  Heather Vaughan  has presented today for surgery, with the diagnosis of left breast cancer  The various methods of treatment have been discussed with the patient and family. After consideration of risks, benefits and other options for treatment, the patient has consented to  Procedure(s): REMOVAL PORT-A-CATH (Right) as a surgical intervention .  The patient's history has been reviewed, patient examined, no change in status, stable for surgery.  I have reviewed the patient's chart and labs.  Questions were answered to the patient's satisfaction.     Aviva Signs A

## 2015-04-27 NOTE — Discharge Instructions (Signed)
Apply ice to wound as needed for swelling.    May shower tomorrow.

## 2015-04-29 ENCOUNTER — Encounter (HOSPITAL_COMMUNITY): Payer: Self-pay | Admitting: General Surgery

## 2015-05-19 ENCOUNTER — Encounter (INDEPENDENT_AMBULATORY_CARE_PROVIDER_SITE_OTHER): Payer: Self-pay | Admitting: *Deleted

## 2015-05-19 ENCOUNTER — Ambulatory Visit (INDEPENDENT_AMBULATORY_CARE_PROVIDER_SITE_OTHER): Payer: 59 | Admitting: Internal Medicine

## 2015-06-19 ENCOUNTER — Ambulatory Visit (HOSPITAL_COMMUNITY)
Admission: RE | Admit: 2015-06-19 | Discharge: 2015-06-19 | Disposition: A | Discharge status: Home / Self Care | Payer: 59 | Source: Ambulatory Visit | Attending: Hematology & Oncology | Admitting: Hematology & Oncology

## 2015-06-19 DIAGNOSIS — Z923 Personal history of irradiation: Secondary | ICD-10-CM | POA: Insufficient documentation

## 2015-06-19 DIAGNOSIS — Z853 Personal history of malignant neoplasm of breast: Secondary | ICD-10-CM | POA: Diagnosis not present

## 2015-06-19 DIAGNOSIS — J432 Centrilobular emphysema: Secondary | ICD-10-CM | POA: Insufficient documentation

## 2015-06-19 DIAGNOSIS — Z9221 Personal history of antineoplastic chemotherapy: Secondary | ICD-10-CM | POA: Diagnosis not present

## 2015-06-19 DIAGNOSIS — R918 Other nonspecific abnormal finding of lung field: Secondary | ICD-10-CM | POA: Insufficient documentation

## 2015-06-19 DIAGNOSIS — K449 Diaphragmatic hernia without obstruction or gangrene: Secondary | ICD-10-CM | POA: Diagnosis not present

## 2015-06-19 DIAGNOSIS — Z9012 Acquired absence of left breast and nipple: Secondary | ICD-10-CM | POA: Insufficient documentation

## 2015-06-19 LAB — POCT I-STAT CREATININE: CREATININE: 0.9 mg/dL (ref 0.44–1.00)

## 2015-06-19 MED ORDER — IOHEXOL 300 MG/ML  SOLN
75.0000 mL | Freq: Once | INTRAMUSCULAR | Status: AC | PRN
  Administered 2015-06-19: 75 mL via INTRAVENOUS

## 2015-06-22 ENCOUNTER — Ambulatory Visit (INDEPENDENT_AMBULATORY_CARE_PROVIDER_SITE_OTHER): Payer: 59 | Admitting: Internal Medicine

## 2015-06-22 ENCOUNTER — Other Ambulatory Visit (HOSPITAL_COMMUNITY): Payer: Self-pay | Admitting: *Deleted

## 2015-06-22 ENCOUNTER — Other Ambulatory Visit (HOSPITAL_COMMUNITY): Payer: Self-pay | Admitting: Oncology

## 2015-06-22 DIAGNOSIS — C773 Secondary and unspecified malignant neoplasm of axilla and upper limb lymph nodes: Principal | ICD-10-CM

## 2015-06-22 DIAGNOSIS — C50919 Malignant neoplasm of unspecified site of unspecified female breast: Secondary | ICD-10-CM

## 2015-06-22 MED ORDER — EXEMESTANE 25 MG PO TABS: ORAL_TABLET | ORAL | Status: DC

## 2015-06-25 NOTE — Assessment & Plan Note (Addendum)
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 S/P left partial mastectomy on 07/03/2013 demonstrating a 1.4 cm invasive component that was ER 74%, PR 16%, Ki-67 52%, and HER2 negative. However, positive margins were noted and therefore on 07/17/2013 she underwent a left modified radical mastectomy and left axillary lymph node dissection. Clear margins were ascertained with 8/26 positive lymph nodes positive for metastatic disease. Testing on the positive lymph node reveals HER2 positivity. OncoType DX score was 29 placing her in the high-end of the intermediate risk group for recurrence. She was therefore started on [facility] therapy on 08/27/2013 and finished TC on 12/18/2013 after 6 cycles. She is S/P radiation therapy by Dr. Pablo Ledger on 01/27/2014- 03/14/2014. She is S/P completion of 52 weeks worth of Herceptin therapy.  Now on Aromasin (beginning on on 03/29/2014).    Continue Aromasin daily.  Compliance encouraged.   Labs today: CBC diff, CMET, Vit D  Labs in 3 months: CBC diff, CMET  She is scheduled for Mammogram on 07/13/2015.  Return in 3 months for follow-up.  If all is well in 3 months, consideration to spacing appointments out to every 4 months may be reasonable.

## 2015-06-25 NOTE — Progress Notes (Signed)
Heather Vaughan, [place] [address]  Breast cancer metastasized to axillary lymph node, left (HCC) - Plan: omeprazole (PRILOSEC) 20 MG capsule, CBC with Differential, Comprehensive metabolic panel, Vitamin D 25 hydroxy, CBC with Differential, Comprehensive metabolic panel, Vitamin D 25 hydroxy, Vitamin D 25 hydroxy, Comprehensive metabolic panel, CBC with Differential  CURRENT THERAPY: Aromasin beginning on 03/29/2014  INTERVAL HISTORY: PARA COSSEY 59 y.o. 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 S/P left partial mastectomy on 07/03/2013 demonstrating a 1.4 cm invasive component that was ER 74%, PR 16%, Ki-67 52%, and HER2 negative. However, positive margins were noted and therefore on 07/17/2013 she underwent a left modified radical mastectomy and left axillary lymph node dissection. Clear margins were ascertained with 6/43 positive lymph nodes positive for metastatic disease. Testing on the positive lymph node reveals HER2 positivity. OncoType DX score was 29 placing her in the high-end of the intermediate risk group for recurrence. She was therefore started on [facility] therapy on 08/27/2013 and finished TC on 12/18/2013 after 6 cycles. She is S/P radiation therapy by Dr. Pablo Ledger on 01/27/2014- 03/14/2014. She is S/P completion of 52 weeks worth of Herceptin therapy.  Now on Aromasin (beginning on on 03/29/2014).      Invasive ductal carcinoma metastasized to axillary lymph node, left   05/28/2013 Imaging Screening mammogram   06/19/2013 Imaging Left diagnostic mammogram   06/25/2013 Imaging CT CAP- borderline enlarged axillary lymph node on left.  Small pulmonary nodules noted but too small for PET or biopsy; surveillance recommended.    07/03/2013 Surgery Left partial mastectomy.  Invasive ductal carcinoma. 1.4 cm.  POSITIVE MARGINS.  Additional extensive DCIS noted.  ER 74%, PR 16%, Ki-67 52%, HER2 NEGATIVE.     07/17/2013 Surgery Left modified radiacl mastectomy with left axillary node dissection.  Negative residual cancer.  Negative inked margins.  1/12 positive lymph nodes.  FINAL MARGINS NEGATIVE.  LYMPH NODE IS HER2 POSITIVE.   08/13/2013 Imaging MUGA scan shows left ventricular EF of 62%   08/27/2013 - 12/18/2013 Chemotherapy TCH x 6 cycles   01/07/2014 -  Chemotherapy Herceptin x 52 weeks   01/27/2014 - 03/13/2014 Radiation Therapy By Dr. Pablo Ledger   03/29/2014 -  Chemotherapy Aromasin to start on 03/29/2014   10/24/2014 Imaging Bone density- BMD as determined from Femur Neck Right is 0.698 g/cm2 with a T-Score of -2.4. This patient is considered osteopenic according to [facility]) criteria. (Lumbar spine was not utilized due to advanced degenerative changes).   11/05/2014 Imaging MUGA- Left ventricular ejection fraction equals 70%. No change from prior.   04/27/2015 Procedure Port-A-Cath removal by Dr. Arnoldo Morale   06/19/2015 Imaging CT chest- Status post left mastectomy, without recurrent or metastatic disease. Decrease in size of right-sided pulmonary nodules, favoring an infectious or inflammatory etiology.    I personally reviewed and went over laboratory results with the patient.  The results are noted within this dictation.  She was referred to Dr. Laural Golden for consideration of EGD due to ongoing nausea.  Her appointment desk demonstrates that the patient cancelled her appointment to wait until after the New Year.  Her CT scan demonstrates a hiatal hernia and findings suspicious for gastritis.  I think GI work-up would be beneficial for her.  She is resistant at this time.  She is doing well without any complaints.  She admits to compliance with AI therapy.  Past Medical History  Diagnosis  Date  . GERD (gastroesophageal reflux disease)   . Hypertension   . Bronchitis 07/27/2011  . Barrett's esophagus   . Anxiety   . Breast cancer, left breast ([place])   . Osteopenia 10/09/2014  .  Breast cancer ([place])     has GERD (gastroesophageal reflux disease); Hypertension; Barrett esophagus; Invasive ductal carcinoma metastasized to axillary lymph node, left; Peripheral neuropathy due to chemotherapy ([place]); Unstable balance; Stiffness of joint, not elsewhere classified, ankle and foot; Ankle weakness; and Osteopenia on her problem list.     has No Known Allergies.  Ms. Covino does not currently have medications on file.  Past Surgical History  Procedure Laterality Date  . Excision morton's neuroma  07/14/2011    Procedure: EXCISION MORTON'S NEUROMA;  Surgeon: Marcheta Grammes;  Location: [facility] ORS;  Service: Orthopedics;  Laterality: Right;  Removal Neuroma 3rd Interspace Right Foot  . Esophagogastroduodenoscopy  08/03/2011    Procedure: ESOPHAGOGASTRODUODENOSCOPY (EGD);  Surgeon: Rogene Houston, MD;  Location: [facility] ENDO SUITE;  Service: Endoscopy;  Laterality: N/A;  12:45  . Partial mastectomy with needle localization Left 07/03/2013    Procedure: PARTIAL MASTECTOMY STATUS POST NEEDLE LOCALIZATION;  Surgeon: Scherry Ran, MD;  Location: [facility] ORS;  Service: General;  Laterality: Left;  . Breast biopsy Bilateral     X 4- benign  . Mastectomy modified radical Left 07/17/2013    Procedure: MASTECTOMY MODIFIED RADICAL WITH LEFT AXILLARY TISSUE REMOVAL;  Surgeon: Scherry Ran, MD;  Location: [facility] ORS;  Service: General;  Laterality: Left;  . Portacath placement Right 08/16/2013    Procedure: INSERTION PORT-A-CATH RIGHT SUBCLAVIAN;  Surgeon: Scherry Ran, MD;  Location: [facility] OR[facility];  Service: General;  Laterality: Right;  . Port-a-cath removal Right 04/27/2015    Procedure: REMOVAL PORT-A-CATH;  Surgeon: Aviva Signs, MD;  Location: [facility] OR[facility];  Service: General;  Laterality: Right;    Denies any headaches, dizziness, double vision, fevers, chills, night sweats, nausea, vomiting, diarrhea, constipation, chest pain, heart palpitations, shortness of breath, blood in stool, black  tarry stool, urinary pain, urinary burning, urinary frequency, hematuria.   PHYSICAL EXAMINATION  ECOG PERFORMANCE STATUS: 0 - Asymptomatic  Filed Vitals:   06/26/15 1320  BP: 106/54  Pulse: 71  Temp: 98.9 F (37.2 C)  Resp: 18    GENERAL:alert, healthy, no distress, well nourished, well developed, comfortable, cooperative and smiling SKIN: skin color, texture, turgor are normal, no rashes or significant lesions HEAD: Normocephalic, No masses, lesions, tenderness or abnormalities EYES: normal, PERRLA, EOMI, Conjunctiva are pink and non-injected EARS: External ears normal OROPHARYNX:lips, buccal mucosa, and tongue normal and mucous membranes are moist  NECK: supple, no adenopathy, thyroid normal size, non-tender, without nodularity, no stridor, non-tender, trachea midline LYMPH:  no palpable lymphadenopathy BREAST:risk and benefit of breast self-exam was discussed LUNGS: clear to auscultation and percussion HEART: regular rate & rhythm, no murmurs, no gallops, S1 normal and S2 normal ABDOMEN:abdomen soft, non-tender, normal bowel sounds and no masses or organomegaly BACK: Back symmetric, no curvature., No CVA tenderness EXTREMITIES:less then 2 second capillary refill, no joint deformities, effusion, or inflammation, no edema, no skin discoloration, no clubbing, no cyanosis  NEURO: alert & oriented x 3 with fluent speech, no focal motor/sensory deficits, gait normal   LABORATORY DATA: CBC    Component Value Date/Time   WBC 6.8 06/26/2015 1420   RBC 3.52* 06/26/2015 1420   RBC 2.41* 01/13/2014 1000   HGB 12.0 06/26/2015 1420   HCT 36.2 06/26/2015 1420   PLT 190  06/26/2015 1420   MCV 102.8* 06/26/2015 1420   MCH 34.1* 06/26/2015 1420   MCHC 33.1 06/26/2015 1420   RDW 12.1 06/26/2015 1420   LYMPHSABS 1.9 06/26/2015 1420   MONOABS 0.4 06/26/2015 1420   EOSABS 0.3 06/26/2015 1420   BASOSABS 0.0 06/26/2015 1420      Chemistry      Component Value Date/Time   NA 138  03/26/2015 1430   K 3.1* 03/26/2015 1430   CL 102 03/26/2015 1430   CO2 27 03/26/2015 1430   BUN 15 03/26/2015 1430   CREATININE 0.90 06/19/2015 0948      Component Value Date/Time   CALCIUM 8.8* 03/26/2015 1430   ALKPHOS 62 03/26/2015 1430   AST 16 03/26/2015 1430   ALT 15 03/26/2015 1430   BILITOT 0.5 03/26/2015 1430       ASSESSMENT AND PLAN:  Invasive ductal carcinoma metastasized to axillary lymph node, left Invasive ductal carcinoma of left breast with high grade DCIS. S/P left partial mastectomy on 07/03/2013 demonstrating a 1.4 cm invasive component that was ER 74%, PR 16%, Ki-67 52%, and HER2 negative. However, positive margins were noted and therefore on 07/17/2013 she underwent a left modified radical mastectomy and left axillary lymph node dissection. Clear margins were ascertained with 3/42 positive lymph nodes positive for metastatic disease. Testing on the positive lymph node reveals HER2 positivity. OncoType DX score was 29 placing her in the high-end of the intermediate risk group for recurrence. She was therefore started on [facility] therapy on 08/27/2013 and finished TC on 12/18/2013 after 6 cycles. She is S/P radiation therapy by Dr. Pablo Ledger on 01/27/2014- 03/14/2014. She is S/P completion of 52 weeks worth of Herceptin therapy.  Now on Aromasin (beginning on on 03/29/2014).    Continue Aromasin daily.  Compliance encouraged.   Labs today: CBC diff, CMET, Vit D  Labs in 3 months: CBC diff, CMET  She is scheduled for Mammogram on 07/13/2015.  Return in 3 months for follow-up.  If all is well in 3 months, consideration to spacing appointments out to every 4 months may be reasonable.    THERAPY PLAN:  NCCN guidelines recommends the following surveillance for invasive breast cancer:  A. History and Physical exam every 4-6 months for 5 years and then every 12 months.  B. Mammography every 12 months  C. Women on Tamoxifen: annual gynecologic assessment every 12 months if  uterus is present.  D. Women on aromatase inhibitor or who experience ovarian failure secondary to treatment should have monitoring of bone health with a bone mineral density determination at baseline and periodically thereafter.  E. Assess and encourage adherence to adjuvant endocrine therapy.  F. Evidence suggests that active lifestyle and achieving and maintaining an ideal body weight (20-25 BMI) may lead to optimal breast cancer outcomes.   All questions were answered. The patient knows to call the clinic with any problems, questions or concerns. We can certainly see the patient much sooner if necessary.  Patient and plan discussed with Dr. Ancil Linsey and she is in agreement with the aforementioned.   This note is electronically signed by: Robynn Pane 06/26/2015 4:12 PM

## 2015-06-26 ENCOUNTER — Encounter (HOSPITAL_COMMUNITY): Payer: Self-pay | Admitting: Oncology

## 2015-06-26 ENCOUNTER — Encounter (HOSPITAL_COMMUNITY): Payer: 59 | Attending: Hematology and Oncology | Admitting: Oncology

## 2015-06-26 VITALS — BP 106/54 | HR 71 | Temp 98.9°F | Resp 18 | Wt 125.2 lb

## 2015-06-26 DIAGNOSIS — C773 Secondary and unspecified malignant neoplasm of axilla and upper limb lymph nodes: Secondary | ICD-10-CM | POA: Diagnosis present

## 2015-06-26 DIAGNOSIS — C50919 Malignant neoplasm of unspecified site of unspecified female breast: Secondary | ICD-10-CM | POA: Insufficient documentation

## 2015-06-26 DIAGNOSIS — R110 Nausea: Secondary | ICD-10-CM

## 2015-06-26 DIAGNOSIS — C50812 Malignant neoplasm of overlapping sites of left female breast: Secondary | ICD-10-CM | POA: Diagnosis not present

## 2015-06-26 DIAGNOSIS — C50912 Malignant neoplasm of unspecified site of left female breast: Secondary | ICD-10-CM | POA: Diagnosis present

## 2015-06-26 LAB — COMPREHENSIVE METABOLIC PANEL
ALK PHOS: 62 U/L (ref 38–126)
ALT: 18 U/L (ref 14–54)
ANION GAP: 7 (ref 5–15)
AST: 19 U/L (ref 15–41)
Albumin: 4.4 g/dL (ref 3.5–5.0)
BILIRUBIN TOTAL: 0.5 mg/dL (ref 0.3–1.2)
BUN: 15 mg/dL (ref 6–20)
CALCIUM: 9.2 mg/dL (ref 8.9–10.3)
CO2: 27 mmol/L (ref 22–32)
Chloride: 105 mmol/L (ref 101–111)
Creatinine, Ser: 0.75 mg/dL (ref 0.44–1.00)
GLUCOSE: 84 mg/dL (ref 65–99)
Potassium: 3.4 mmol/L — ABNORMAL LOW (ref 3.5–5.1)
Sodium: 139 mmol/L (ref 135–145)
TOTAL PROTEIN: 6.9 g/dL (ref 6.5–8.1)

## 2015-06-26 LAB — CBC WITH DIFFERENTIAL/PLATELET
BASOS ABS: 0 10*3/uL (ref 0.0–0.1)
BASOS PCT: 1 %
Eosinophils Absolute: 0.3 10*3/uL (ref 0.0–0.7)
Eosinophils Relative: 5 %
HEMATOCRIT: 36.2 % (ref 36.0–46.0)
Hemoglobin: 12 g/dL (ref 12.0–15.0)
Lymphocytes Relative: 28 %
Lymphs Abs: 1.9 10*3/uL (ref 0.7–4.0)
MCH: 34.1 pg — ABNORMAL HIGH (ref 26.0–34.0)
MCHC: 33.1 g/dL (ref 30.0–36.0)
MCV: 102.8 fL — ABNORMAL HIGH (ref 78.0–100.0)
MONO ABS: 0.4 10*3/uL (ref 0.1–1.0)
Monocytes Relative: 6 %
NEUTROS ABS: 4.2 10*3/uL (ref 1.7–7.7)
Neutrophils Relative %: 61 %
PLATELETS: 190 10*3/uL (ref 150–400)
RBC: 3.52 MIL/uL — AB (ref 3.87–5.11)
RDW: 12.1 % (ref 11.5–15.5)
WBC: 6.8 10*3/uL (ref 4.0–10.5)

## 2015-06-26 NOTE — Progress Notes (Signed)
Heather Vaughan presented for lab work. Labs per MD order drawn via Peripheral Line 23 gauge needle inserted in right anticubital  Good blood return present. Procedure without incident.  Needle removed intact. Patient tolerated procedure well.

## 2015-06-26 NOTE — Patient Instructions (Signed)
[place] at [facility] Discharge Instructions  RECOMMENDATIONS MADE BY THE CONSULTANT AND ANY TEST RESULTS WILL BE SENT TO YOUR REFERRING PHYSICIAN.  Exam done and seen today by Kirby Crigler PA-C Labs today and in 53months Mammogram in November Return in 49months for Follow up with Dr.Penland  Thank you for choosing [place] at [facility] to provide your oncology and hematology care.  To afford each patient quality time with our provider, please arrive at least 15 minutes before your scheduled appointment time.    You need to re-schedule your appointment should you arrive 10 or more minutes late.  We strive to give you quality time with our providers, and arriving late affects you and other patients whose appointments are after yours.  Also, if you no show three or more times for appointments you may be dismissed from the clinic at the providers discretion.     Again, thank you for choosing [facility].  Our hope is that these requests will decrease the amount of time that you wait before being seen by our physicians.       _____________________________________________________________  Should you have questions after your visit to [facility], please contact our office at [phone] between the hours of 8:30 a.m. and 4:30 p.m.  Voicemails left after 4:30 p.m. will not be returned until the following business day.  For prescription refill requests, have your pharmacy contact our office.

## 2015-06-27 ENCOUNTER — Other Ambulatory Visit (HOSPITAL_COMMUNITY): Payer: Self-pay | Admitting: Oncology

## 2015-06-27 DIAGNOSIS — E876 Hypokalemia: Secondary | ICD-10-CM

## 2015-06-27 LAB — VITAMIN D 25 HYDROXY (VIT D DEFICIENCY, FRACTURES): VIT D 25 HYDROXY: 52.6 ng/mL (ref 30.0–100.0)

## 2015-06-27 MED ORDER — POTASSIUM CHLORIDE CRYS ER 20 MEQ PO TBCR: 40.0000 meq | EXTENDED_RELEASE_TABLET | Freq: Every day | ORAL | Status: DC

## 2015-07-13 ENCOUNTER — Ambulatory Visit (HOSPITAL_COMMUNITY)
Admission: RE | Admit: 2015-07-13 | Discharge: 2015-07-13 | Disposition: A | Discharge status: Home / Self Care | Payer: 59 | Source: Ambulatory Visit | Attending: Hematology & Oncology | Admitting: Hematology & Oncology

## 2015-07-13 DIAGNOSIS — Z139 Encounter for screening, unspecified: Secondary | ICD-10-CM

## 2015-07-13 DIAGNOSIS — Z9012 Acquired absence of left breast and nipple: Secondary | ICD-10-CM | POA: Insufficient documentation

## 2015-07-13 DIAGNOSIS — C773 Secondary and unspecified malignant neoplasm of axilla and upper limb lymph nodes: Secondary | ICD-10-CM

## 2015-07-13 DIAGNOSIS — Z1231 Encounter for screening mammogram for malignant neoplasm of breast: Secondary | ICD-10-CM | POA: Diagnosis present

## 2015-07-13 DIAGNOSIS — C50912 Malignant neoplasm of unspecified site of left female breast: Secondary | ICD-10-CM

## 2015-09-07 IMAGING — CR DG CHEST 1V PORT
1 series · 1 of 1 positions shown · non-contrast
Comparison: Chest x-ray of November 27, 2008.

CLINICAL DATA: Postoperative from Port-A-Cath placement.

EXAM:
PORTABLE CHEST - 1 VIEW

[portable]
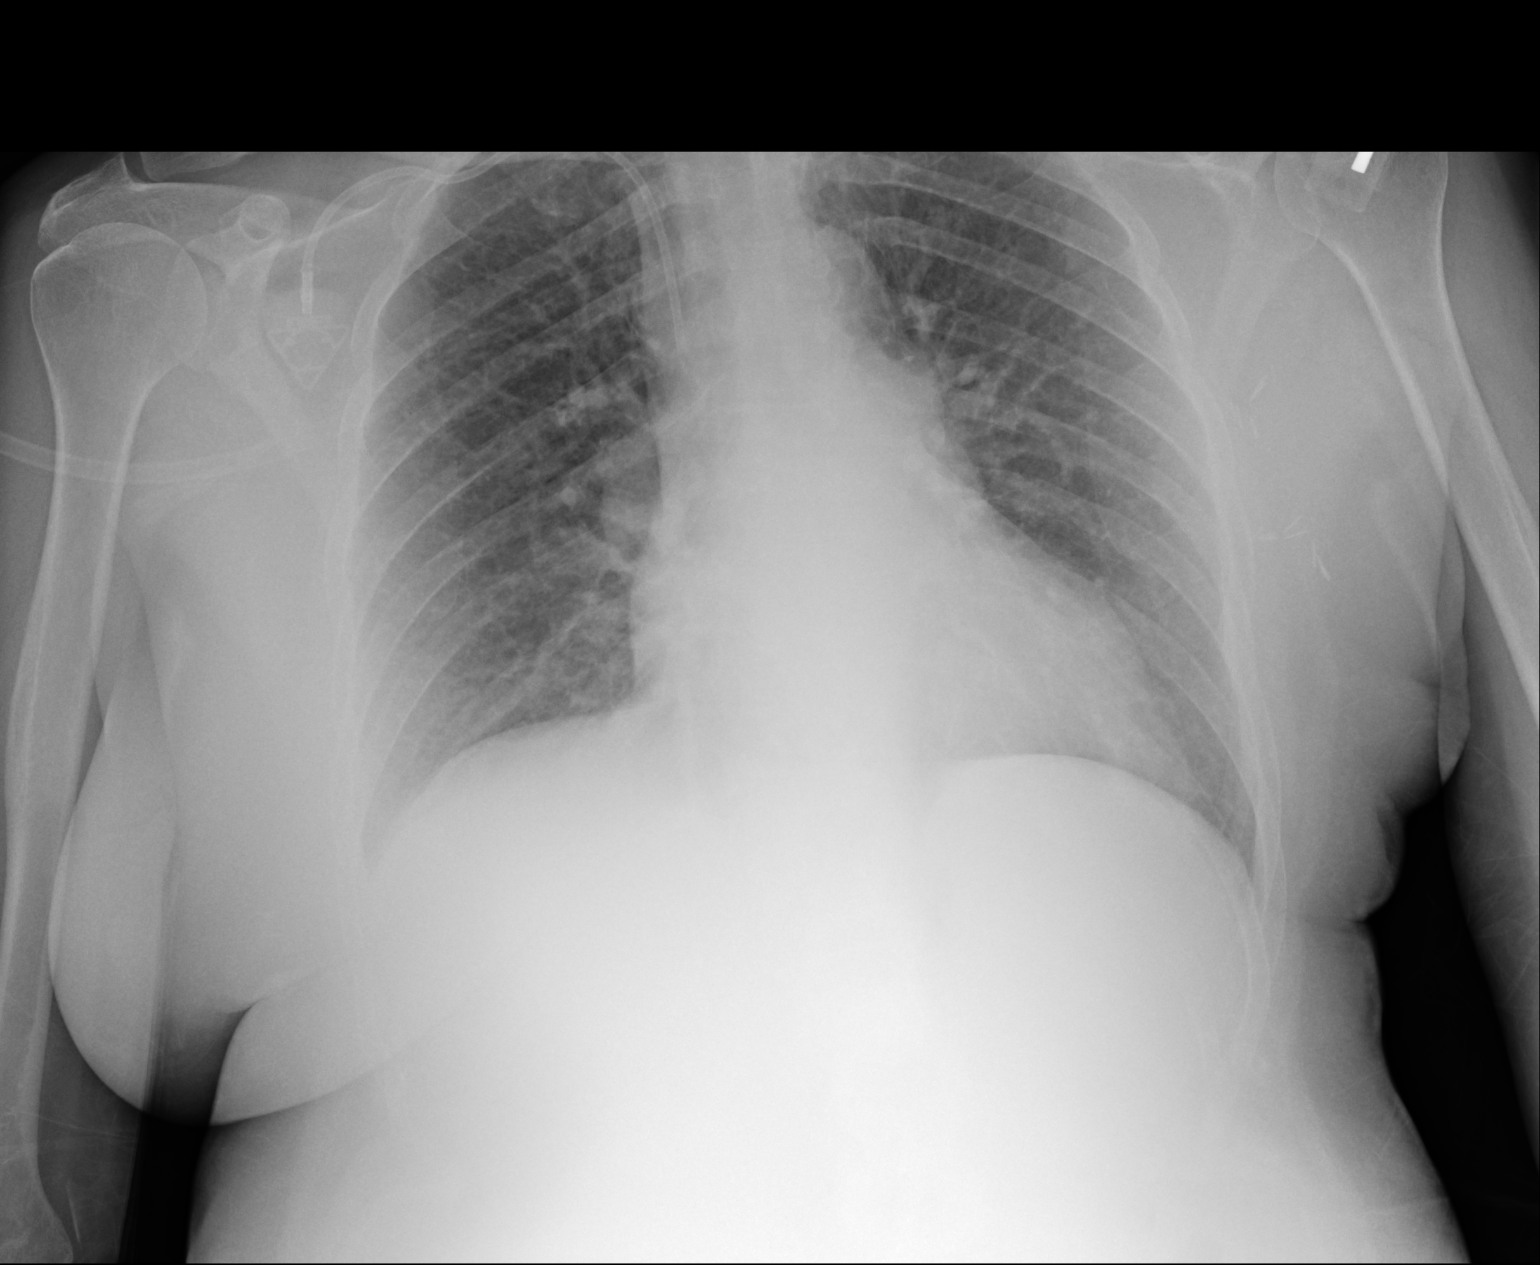

[1 of 1 positions shown; findings below may reference images not displayed]

FINDINGS: A Port-A-Cath appliance has been placed via the right subclavian
approach with the tip of the catheter in the region of the proximal
to mid SVC. There is no postprocedure pneumothorax or pneumothorax.
The lungs are well-expanded. The interstitial markings are mildly
prominent bilaterally. The cardiopericardial silhouette is normal in
size. The pulmonary vascularity is not engorged. The patient has
undergone previous left mastectomy and left axillary lymph node
dissection.
IMPRESSION: There is no evidence of a postprocedure complication following
placement of the Port-A-Cath appliance on the right.

## 2015-09-28 ENCOUNTER — Encounter (HOSPITAL_COMMUNITY): Payer: 59

## 2015-09-28 ENCOUNTER — Encounter (HOSPITAL_BASED_OUTPATIENT_CLINIC_OR_DEPARTMENT_OTHER): Payer: 59

## 2015-09-28 ENCOUNTER — Telehealth (HOSPITAL_COMMUNITY): Payer: Self-pay | Admitting: Hematology & Oncology

## 2015-09-28 ENCOUNTER — Encounter (HOSPITAL_COMMUNITY): Payer: 59 | Attending: Hematology & Oncology | Admitting: Hematology & Oncology

## 2015-09-28 ENCOUNTER — Encounter (HOSPITAL_COMMUNITY): Payer: Self-pay | Admitting: Hematology & Oncology

## 2015-09-28 VITALS — BP 124/63 | HR 74 | Temp 98.7°F | Resp 16 | Wt 129.0 lb

## 2015-09-28 DIAGNOSIS — J3489 Other specified disorders of nose and nasal sinuses: Secondary | ICD-10-CM | POA: Diagnosis not present

## 2015-09-28 DIAGNOSIS — M8580 Other specified disorders of bone density and structure, unspecified site: Secondary | ICD-10-CM | POA: Insufficient documentation

## 2015-09-28 DIAGNOSIS — C50912 Malignant neoplasm of unspecified site of left female breast: Secondary | ICD-10-CM | POA: Diagnosis not present

## 2015-09-28 DIAGNOSIS — R911 Solitary pulmonary nodule: Secondary | ICD-10-CM

## 2015-09-28 DIAGNOSIS — D7589 Other specified diseases of blood and blood-forming organs: Secondary | ICD-10-CM | POA: Diagnosis not present

## 2015-09-28 DIAGNOSIS — C773 Secondary and unspecified malignant neoplasm of axilla and upper limb lymph nodes: Principal | ICD-10-CM

## 2015-09-28 LAB — CBC WITH DIFFERENTIAL/PLATELET
BASOS ABS: 0.1 10*3/uL (ref 0.0–0.1)
Basophils Relative: 2 %
EOS ABS: 0.8 10*3/uL — AB (ref 0.0–0.7)
EOS PCT: 11 %
HEMATOCRIT: 37.7 % (ref 36.0–46.0)
Hemoglobin: 12.4 g/dL (ref 12.0–15.0)
LYMPHS ABS: 1.6 10*3/uL (ref 0.7–4.0)
Lymphocytes Relative: 23 %
MCH: 33.7 pg (ref 26.0–34.0)
MCHC: 32.9 g/dL (ref 30.0–36.0)
MCV: 102.4 fL — AB (ref 78.0–100.0)
MONOS PCT: 5 %
Monocytes Absolute: 0.4 10*3/uL (ref 0.1–1.0)
Neutro Abs: 4.2 10*3/uL (ref 1.7–7.7)
Neutrophils Relative %: 59 %
Platelets: 213 10*3/uL (ref 150–400)
RBC: 3.68 MIL/uL — AB (ref 3.87–5.11)
RDW: 12.3 % (ref 11.5–15.5)
WBC: 7 10*3/uL (ref 4.0–10.5)

## 2015-09-28 LAB — COMPREHENSIVE METABOLIC PANEL
ALK PHOS: 72 U/L (ref 38–126)
ALT: 20 U/L (ref 14–54)
AST: 20 U/L (ref 15–41)
Albumin: 4.4 g/dL (ref 3.5–5.0)
Anion gap: 10 (ref 5–15)
BILIRUBIN TOTAL: 0.5 mg/dL (ref 0.3–1.2)
BUN: 19 mg/dL (ref 6–20)
CALCIUM: 9.3 mg/dL (ref 8.9–10.3)
CHLORIDE: 100 mmol/L — AB (ref 101–111)
CO2: 26 mmol/L (ref 22–32)
CREATININE: 0.77 mg/dL (ref 0.44–1.00)
Glucose, Bld: 105 mg/dL — ABNORMAL HIGH (ref 65–99)
Potassium: 3.6 mmol/L (ref 3.5–5.1)
Sodium: 136 mmol/L (ref 135–145)
TOTAL PROTEIN: 7.3 g/dL (ref 6.5–8.1)

## 2015-09-28 MED ORDER — AZITHROMYCIN 250 MG PO TABS: ORAL_TABLET | ORAL | Status: DC

## 2015-09-28 MED ORDER — DENOSUMAB 60 MG/ML ~~LOC~~ SOLN
60.0000 mg | Freq: Once | SUBCUTANEOUS | Status: AC
  Administered 2015-09-28: 60 mg via SUBCUTANEOUS
  Filled 2015-09-28: qty 1

## 2015-09-28 NOTE — Telephone Encounter (Signed)
Per Corinna Capra is not required for [id number] prolia  Call ref# [id number]

## 2015-09-28 NOTE — Progress Notes (Signed)
Heather Vaughan, [place] [address]   DIAGNOSIS: Invasive ductal carcinoma metastasized to axillary lymph node, left   Staging form: Breast, AJCC 7th Edition     Clinical: Stage IIA (T1c, N1, cM0) - Signed by Baird Cancer, PA-C on 09/15/2013   SUMMARY OF ONCOLOGIC HISTORY:   Invasive ductal carcinoma metastasized to axillary lymph node, left   05/28/2013 Imaging Screening mammogram   06/19/2013 Imaging Left diagnostic mammogram   06/25/2013 Imaging CT CAP- borderline enlarged axillary lymph node on left.  Small pulmonary nodules noted but too small for PET or biopsy; surveillance recommended.    07/03/2013 Surgery Left partial mastectomy.  Invasive ductal carcinoma. 1.4 cm.  POSITIVE MARGINS.  Additional extensive DCIS noted.  ER 74%, PR 16%, Ki-67 52%, HER2 NEGATIVE.   07/17/2013 Surgery Left modified radiacl mastectomy with left axillary node dissection.  Negative residual cancer.  Negative inked margins.  1/12 positive lymph nodes.  FINAL MARGINS NEGATIVE.  LYMPH NODE IS HER2 POSITIVE.   08/13/2013 Imaging MUGA scan shows left ventricular EF of 62%   08/27/2013 - 12/18/2013 Chemotherapy TCH x 6 cycles   01/07/2014 -  Chemotherapy Herceptin x 52 weeks   01/27/2014 - 03/13/2014 Radiation Therapy By Dr. Pablo Ledger   03/29/2014 -  Chemotherapy Aromasin to start on 03/29/2014   10/24/2014 Imaging Bone density- BMD as determined from Femur Neck Right is 0.698 g/cm2 with a T-Score of -2.4. This patient is considered osteopenic according to [facility]) criteria. (Lumbar spine was not utilized due to advanced degenerative changes).   11/05/2014 Imaging MUGA- Left ventricular ejection fraction equals 70%. No change from prior.   04/27/2015 Procedure Port-A-Cath removal by Dr. Arnoldo Morale   06/19/2015 Imaging CT chest- Status post left mastectomy, without recurrent or metastatic disease. Decrease in size of right-sided pulmonary nodules, favoring an  infectious or inflammatory etiology.    CURRENT THERAPY: Aromasin (beginning on on 03/29/2014).   INTERVAL HISTORY: Heather Vaughan 60 y.o. female returns for follow-up of her ER+, HER-2 neu + breast cancer.   Ms. Nolton is here alone today.   Reports that her nails won't grow and "if they get a little bit long they break off".   She has had a cold for the past four weeks.  Reports drainage from her nose to her throat, constant cough and persistent sinus congestion.  She currently takes a Women's Multivitamin daily but does not take specific Vitamin D and calcium supplements daily.  She will be seeing an orthopedic, Dr. Aline Brochure, for her swollen right foot and ankle. She fell off a step stool while removing Christmas lights. She initially went to an urgent care in [place] where they x-rayed it and found that it was not broken. They believed it was sprained and suggested for her to see an orthopedic physician.  Her last colonoscopy was performed about 10 years ago with Dr. Gala Romney.    MEDICAL HISTORY: Past Medical History  Diagnosis Date  . GERD (gastroesophageal reflux disease)   . Hypertension   . Bronchitis 07/27/2011  . Barrett's esophagus   . Anxiety   . Breast cancer, left breast ([place])   . Osteopenia 10/09/2014  . Breast cancer ([place])     has GERD (gastroesophageal reflux disease); Hypertension; Barrett esophagus; Invasive ductal carcinoma metastasized to axillary lymph node, left; Peripheral neuropathy due to chemotherapy ([place]); Unstable balance; Stiffness of joint, not elsewhere classified, ankle and foot; Ankle weakness; and Osteopenia on her problem list.  has No Known Allergies.  Ms. Capek does not currently have medications on file.  SURGICAL HISTORY: Past Surgical History  Procedure Laterality Date  . Excision morton's neuroma  07/14/2011    Procedure: EXCISION MORTON'S NEUROMA;  Surgeon: Marcheta Grammes;  Location: [facility] ORS;  Service: Orthopedics;   Laterality: Right;  Removal Neuroma 3rd Interspace Right Foot  . Esophagogastroduodenoscopy  08/03/2011    Procedure: ESOPHAGOGASTRODUODENOSCOPY (EGD);  Surgeon: Rogene Houston, MD;  Location: [facility] ENDO SU[facility];  Service: Endoscopy;  Laterality: N/A;  12:45  . Partial mastectomy with needle localization Left 07/03/2013    Procedure: PARTIAL MASTECTOMY STATUS POST NEEDLE LOCALIZATION;  Surgeon: Scherry Ran, MD;  Location: [facility] ORS;  Service: General;  Laterality: Left;  . Breast biopsy Bilateral     X 4- benign  . Mastectomy modified radical Left 07/17/2013    Procedure: MASTECTOMY MODIFIED RADICAL WITH LEFT AXILLARY TISSUE REMOVAL;  Surgeon: Scherry Ran, MD;  Location: [facility] ORS;  Service: General;  Laterality: Left;  . Portacath placement Right 08/16/2013    Procedure: INSERTION PORT-A-CATH RIGHT SUBCLAVIAN;  Surgeon: Scherry Ran, MD;  Location: [facility] ORS;  Service: General;  Laterality: Right;  . Port-a-cath removal Right 04/27/2015    Procedure: REMOVAL PORT-A-CATH;  Surgeon: Aviva Signs, MD;  Location: [facility] ORS;  Service: General;  Laterality: Right;    SOCIAL HISTORY: Social History   Social History  . Marital Status: Married    Spouse Name: N/A  . Number of Children: N/A  . Years of Education: N/A   Occupational History  . Not on file.   Social History Main Topics  . Smoking status: Former Smoker -- 1.00 packs/day for 20 years    Types: Cigarettes    Quit date: 09/05/1993  . Smokeless tobacco: Never Used  . Alcohol Use: No     Comment: occasionally  . Drug Use: No  . Sexual Activity: Not on file   Other Topics Concern  . Not on file   Social History Narrative    FAMILY HISTORY: Family History  Problem Relation Age of Onset  . Anesthesia problems Neg Hx   . Hypotension Neg Hx   . Malignant hyperthermia Neg Hx   . Pseudochol deficiency Neg Hx     Review of Systems  Constitutional: Negative.   HENT: Negative.   Eyes: Negative.   Respiratory: Positive  for runny nose and congestion.    Attributed to cold that began four weeks ago.  Cardiovascular: Negative.   Gastrointestinal: Negative.   Genitourinary: Negative.   Musculoskeletal: Positive for swelling. Swelling in right foot and ankle from recent injury. Skin: Negative. Neurological: Negative.  Endo/Heme/Allergies: Negative.   Psychiatric/Behavioral: Negative.   14 point review of systems was performed and is negative except as detailed under history of present illness and above   PHYSICAL EXAMINATION  ECOG PERFORMANCE STATUS: 0 - Asymptomatic  Filed Vitals:   09/28/15 1410  BP: 124/63  Pulse: 74  Temp: 98.7 F (37.1 C)  Resp: 16    Physical Exam  Constitutional: She is oriented to person, place, and time and well-developed, well-nourished, and in no distress.  HENT:  Head: Normocephalic and atraumatic.  Nose: Nose normal.  Mouth/Throat: Oropharynx is clear and moist. No oropharyngeal exudate.  Eyes: Conjunctivae and EOM are normal. Pupils are equal, round, and reactive to light. Right eye exhibits no discharge. Left eye exhibits no discharge. No scleral icterus.  Neck: Normal range of motion. Neck supple. No tracheal deviation present. No  thyromegaly present.  Cardiovascular: Normal rate, regular rhythm and normal heart sounds.  Exam reveals no gallop and no friction rub.   No murmur heard. Pulmonary/Chest: Effort normal and breath sounds normal. She has no wheezes. She has no rales.  Abdominal: Soft. Bowel sounds are normal. She exhibits no distension and no mass. There is no tenderness. There is no rebound and no guarding.  Musculoskeletal: Normal range of motion.  Significant swelling in right ankle and foot from recent injury. Lymphadenopathy:    She has no cervical adenopathy.  Neurological: She is alert and oriented to person, place, and time. She has normal reflexes. No cranial nerve deficit. Gait normal. Coordination normal.  Skin: Skin is warm and dry. No  rash noted.  Psychiatric: Mood, memory, affect and judgment normal.  Nursing note and vitals reviewed.   LABORATORY DATA: I have reviewed the data as listed. CBC    Component Value Date/Time   WBC 7.0 09/28/2015 1328   RBC 3.68* 09/28/2015 1328   RBC 2.41* 01/13/2014 1000   HGB 12.4 09/28/2015 1328   HCT 37.7 09/28/2015 1328   PLT 213 09/28/2015 1328   MCV 102.4* 09/28/2015 1328   MCH 33.7 09/28/2015 1328   MCHC 32.9 09/28/2015 1328   RDW 12.3 09/28/2015 1328   LYMPHSABS 1.6 09/28/2015 1328   MONOABS 0.4 09/28/2015 1328   EOSABS 0.8* 09/28/2015 1328   BASOSABS 0.1 09/28/2015 1328   CMP     Component Value Date/Time   NA 136 09/28/2015 1328   K 3.6 09/28/2015 1328   CL 100* 09/28/2015 1328   CO2 26 09/28/2015 1328   GLUCOSE 105* 09/28/2015 1328   BUN 19 09/28/2015 1328   CREATININE 0.77 09/28/2015 1328   CALCIUM 9.3 09/28/2015 1328   PROT 7.3 09/28/2015 1328   ALBUMIN 4.4 09/28/2015 1328   AST 20 09/28/2015 1328   ALT 20 09/28/2015 1328   ALKPHOS 72 09/28/2015 1328   BILITOT 0.5 09/28/2015 1328   GFRNONAA >60 09/28/2015 1328   GFRAA >60 09/28/2015 1328   RADIOLOGY: CLINICAL DATA: Followup of pulmonary nodules. Left breast cancer in 2014 with mastectomy. Chemotherapy and radiation therapy.  EXAM: CT CHEST WITH CONTRAST  TECHNIQUE: Multidetector CT imaging of the chest was performed during intravenous contrast administration.  CONTRAST: 28m OMNIPAQUE IOHEXOL 300 MG/ML SOLN  COMPARISON: 06/09/2014  FINDINGS: Mediastinum/Nodes: No supraclavicular adenopathy. Left mastectomy and axillary node dissection. No axillary adenopathy. Aortic and branch vessel atherosclerosis. Normal heart size, without pericardial effusion. No central pulmonary embolism, on this non-dedicated study. Small middle mediastinal nodes are decreased in size. No mediastinal or hilar adenopathy.  Small hiatal hernia, decreased. Esophageal air-fluid level on image/series  22/2.  Lungs/Pleura: No pleural fluid. Anterior left pleural thickening.  Mild centrilobular emphysema.  A subpleural right lower lobe nodule measures 5 mm on image/series 41/3. Slightly decreased from 7 mm on the prior.  More posterior and medial subpleural right lower lobe nodules x2 are decreased. On the order of 3-4 mm today versus up to 6 mm on the prior.  Superior segment right lower lobe 3 mm nodule on image 24 is unchanged.  Radiation change within the subpleural left upper lobe is improved.  Resolved left lower lobe lung nodule.  Upper abdomen: Normal imaged portions of the liver, spleen, adrenal glands, kidneys. Mucosal hyper enhancement involving the proximal stomach. Underdistention, with apparent wall thickening at 1.8 cm on image/series 48/2. appears similar.  Musculoskeletal: No acute osseous abnormality.  IMPRESSION: 1. Status post left mastectomy, without  recurrent or metastatic disease. 2. Decrease in size of right-sided pulmonary nodules, favoring an infectious or inflammatory etiology. 3. Centrilobular emphysema. 4. Hiatal hernia. Esophageal air fluid level suggests dysmotility or gastroesophageal reflux. Re- demonstration of wall thickening within the proximal stomach with suggestion of mucosal hyperenhancement. Question gastritis. As on the prior exam, if endoscopy has not recently been performed, this should be considered.   Electronically Signed  By: Abigail Miyamoto M.D.  On: 06/19/2015 11:16   ASSESSMENT and THERAPY PLAN: Stage IIa ER positive HER-2 positive carcinoma the left breast  Pulmonary nodules seen on CT imaging with ongoing CT surveillance Macrocytosis, normal B12 and folate Emphysema, lung nodules, R sided last CT (05/2015) with decrease in size  She is up to date with mammography.   I have written her a prescription for zithromax for her persistent sinus congestion, I suspect she may have a sinusitis.. I have  written her a prescription for a mastectomy bra.  She will follow up with Dr. Aline Brochure for her current right swollen foot and ankle.   I have referred her to GI for routine screening colonoscopy.  She will return in four months for follow up with labs. We can consider repeating a CT chest in October of this year.  Orders Placed This Encounter  Procedures  . Comprehensive metabolic panel    Standing Status: Standing     Number of Occurrences: 8     Standing Expiration Date: 09/27/2017  . CBC with Differential    Standing Status: Future     Number of Occurrences:      Standing Expiration Date: 09/27/2016  . Comprehensive metabolic panel    Standing Status: Future     Number of Occurrences:      Standing Expiration Date: 09/27/2016  . Vitamin D 25 hydroxy    Standing Status: Future     Number of Occurrences:      Standing Expiration Date: 09/27/2016  . Vitamin B12    Standing Status: Future     Number of Occurrences:      Standing Expiration Date: 09/27/2016  . Folate    Standing Status: Future     Number of Occurrences:      Standing Expiration Date: 09/27/2016  . SCHEDULING COMMUNICATION    Schedule 15 minute injection appointment.  Cannot select a 6 month frequency so will want to set up another visit in 6 months.    All questions were answered. The patient knows to call the clinic with any problems, questions or concerns. We can certainly see the patient much sooner if necessary.  This document serves as a record of services personally performed by Ancil Linsey, MD. It was created on her behalf by Arlyce Harman, a trained medical scribe. The creation of this record is based on the scribe's personal observations and the provider's statements to them. This document has been checked and approved by the attending provider.  I have reviewed the above documentation for accuracy and completeness, and I agree with the above.  Molli Hazard, MD  09/28/2015

## 2015-09-28 NOTE — Patient Instructions (Addendum)
[place] at [facility] Discharge Instructions  RECOMMENDATIONS MADE BY THE CONSULTANT AND ANY TEST RESULTS WILL BE SENT TO YOUR REFERRING PHYSICIAN.     Exam and discussion by Dr Whitney Muse today Prolia today prolia every 6 months Prescription for mastectomy bra. Z-pack to [facility] for 10 days make sure you take all of this. You can take a hair, skin, nail supplement. Referral to GI to get another colonoscopy completed Labs look good, Vit D level is pending, we will call you with this result.  Return to see the doctor in 4 months Please call the clinic if you have any questions or concerns       Thank you for choosing [place] at [facility] to provide your oncology and hematology care.  To afford each patient quality time with our provider, please arrive at least 15 minutes before your scheduled appointment time.   Beginning January 23rd 2017 lab work for the [facility] will be done in the  Main lab at [facility] on 1st floor. If you have a lab appointment with the [facility] please come in thru the  Main Entrance and check in at the main information desk  You need to re-schedule your appointment should you arrive 10 or more minutes late.  We strive to give you quality time with our providers, and arriving late affects you and other patients whose appointments are after yours.  Also, if you no show three or more times for appointments you may be dismissed from the clinic at the providers discretion.     Again, thank you for choosing [facility].  Our hope is that these requests will decrease the amount of time that you wait before being seen by our physicians.       _____________________________________________________________  Should you have questions after your visit to [facility], please contact our office at [phone] between the hours of 8:30 a.m. and 4:30 p.m.  Voicemails left after 4:30  p.m. will not be returned until the following business day.  For prescription refill requests, have your pharmacy contact our office.

## 2015-09-28 NOTE — Progress Notes (Signed)
Heather Vaughan presents today for injection per MD orders. Prolia 60mg  administered SQ in left Abdomen. Administration without incident. Patient tolerated well.

## 2015-09-29 ENCOUNTER — Other Ambulatory Visit (HOSPITAL_COMMUNITY): Payer: Self-pay | Admitting: *Deleted

## 2015-09-29 DIAGNOSIS — C50912 Malignant neoplasm of unspecified site of left female breast: Secondary | ICD-10-CM

## 2015-09-29 DIAGNOSIS — C773 Secondary and unspecified malignant neoplasm of axilla and upper limb lymph nodes: Principal | ICD-10-CM

## 2015-09-29 LAB — VITAMIN D 25 HYDROXY (VIT D DEFICIENCY, FRACTURES): VIT D 25 HYDROXY: 30 ng/mL (ref 30.0–100.0)

## 2015-09-29 MED ORDER — AZITHROMYCIN 250 MG PO TABS
ORAL_TABLET | ORAL | Status: DC
Start: 2015-09-29 — End: 2015-12-21

## 2015-10-06 ENCOUNTER — Ambulatory Visit (INDEPENDENT_AMBULATORY_CARE_PROVIDER_SITE_OTHER): Payer: 59 | Admitting: Orthopedic Surgery

## 2015-10-06 ENCOUNTER — Ambulatory Visit (INDEPENDENT_AMBULATORY_CARE_PROVIDER_SITE_OTHER): Payer: 59

## 2015-10-06 ENCOUNTER — Encounter: Payer: Self-pay | Admitting: Orthopedic Surgery

## 2015-10-06 VITALS — BP 124/82 | Ht 62.0 in | Wt 129.0 lb

## 2015-10-06 DIAGNOSIS — M25571 Pain in right ankle and joints of right foot: Secondary | ICD-10-CM | POA: Diagnosis not present

## 2015-10-06 DIAGNOSIS — M6789 Other specified disorders of synovium and tendon, multiple sites: Secondary | ICD-10-CM

## 2015-10-06 DIAGNOSIS — M76829 Posterior tibial tendinitis, unspecified leg: Secondary | ICD-10-CM

## 2015-10-06 MED ORDER — DICLOFENAC POTASSIUM 50 MG PO TABS: 50.0000 mg | ORAL_TABLET | Freq: Two times a day (BID) | ORAL | Status: DC

## 2015-10-06 NOTE — Patient Instructions (Signed)
WE WILL SCHEDULE MRI FOR YOU AND CALL YOU WITH APPT  PRESCRIPTION GIVEN FOR KNEE HIGH COMPRESSION STOCKINGS

## 2015-10-06 NOTE — Progress Notes (Signed)
Patient ID: Heather Vaughan, female   DOB: 07/25/1956, 60 y.o.   MRN: [id number]  Chief Complaint  Patient presents with  . Ankle Pain    Rt ankle pain s/p fall around Christmas,referred by J. Guarino    HPI Heather Vaughan is a 60 y.o. female.  [place] about 6-1/2 weeks ago when she slipped off of a step stool. She went to urgent care. X-rays were obtained. The x-ray on the disc is so dark like really related but she was treated with Relafen and Aircast. She will Aircast 1 week went back to work. She did develop some itching and was treated with triamcinolone cream. She presents now with pain, swelling, deformity of the foot and ankle with medial and lateral swelling and planovalgus position of the foot. She rates the pain 9 out of 10 says it's constant and it's definitely worse when standing and walking with slight improvement when she elevates and stays off the foot  She also relays that  She had neuropathy while she was on chemotherapy after breast cancer surgery. She saw t he neurologist and had a nerve study which showed no nerved amage. The neuopa hy reslovedafterte chemotherapy was stopped   Review of Systems Review of Systems Positive findings include ankle leg swelling as stated all other systems were reviewed and were normal  Past Medical History  Diagnosis Date  . GERD (gastroesophageal reflux disease)   . Hypertension   . Bronchitis 07/27/2011  . Barrett's esophagus   . Anxiety   . Breast cancer, left breast ([place])   . Osteopenia 10/09/2014  . Breast cancer [facility])     Past Surgical History  Procedure Laterality Date  . Excision morton's neuroma  07/14/2011    Procedure: EXCISION MORTON'S NEUROMA;  Surgeon: Marcheta Grammes;  Location: [facility] ORS;  Service: Orthopedics;  Laterality: Right;  Removal Neuroma 3rd Interspace Right Foot  . Esophagogastroduodenoscopy  08/03/2011    Procedure: ESOPHAGOGASTRODUODENOSCOPY (EGD);  Surgeon: Rogene Houston, MD;  Location[facility] END[facility];  Service: Endoscopy;  Laterality: N/A;  12:45  . Partial mastectomy with needle localization Left 07/03/2013    Procedure: PARTIAL MASTECTOMY STATUS POST NEEDLE LOCALIZATION;  Surgeon: Scherry Ran, MD;  Location: [facility] ORS;  Service: General;  Laterality: Left;  . Breast biopsy Bilateral     X 4- benign  . Mastectomy modified radical Left 07/17/2013    Procedure: MASTECTOMY MODIFIED RADICAL WITH LEFT AXILLARY TISSUE REMOVAL;  Surgeon: Scherry Ran, MD;  Location: [facility] ORS;  Service: General;  Laterality: Left;  . Portacath placement Right 08/16/2013    Procedure: INSERTION PORT-A-CATH RIGHT SUBCLAVIAN;  Surgeon: Scherry Ran, MD;  Location: [facility] ORS;  Service: General;  Laterality: Right;  . Port-a-cath removal Right 04/27/2015    Procedure: REMOVAL PORT-A-CATH;  Surgeon: Aviva Signs, MD;  Location: [facility] ORS;  Service: General;  Laterality: Right;    Family History  Problem Relation Age of Onset  . Anesthesia problems Neg Hx   . Hypotension Neg Hx   . Malignant hyperthermia Neg Hx   . Pseudochol deficiency Neg Hx     Social History Social History  Substance Use Topics  . Smoking status: Former Smoker -- 1.00 packs/day for 20 years    Types: Cigarettes    Quit date: 09/05/1993  . Smokeless tobacco: Never Used  . Alcohol Use: No     Comment: occasionally    No Known Allergies  Current Outpatient Prescriptions  Medication Sig Dispense Refill  .  azithromycin (ZITHROMAX Z-PAK) 250 MG tablet Take 2 tablets the first day and then 1 tablet daily thereafter until gone. 11 each 0  . DULoxetine (CYMBALTA) 30 MG capsule Take 30 mg by mouth daily.    . ergocalciferol (VITAMIN D2) 50000 UNITS capsule Take 50,000 units weekly for 16 weeks. When completed with 4 months take OTC Vitamin D 2000 units daily. 4 capsule 4  . exemestane (AROMASIN) 25 MG tablet take 1 tablet by mouth AFTER BREAKFAST 30 tablet 5  . lidocaine-prilocaine (EMLA) cream Apply a quarter size amount to port  site 1 hour prior to chemo. Do not rub in. Cover with plastic wrap. (Patient not taking: Reported on 06/26/2015) 30 g 4  . losartan-hydrochlorothiazide (HYZAAR) 100-25 MG per tablet Take 1 tablet by mouth daily.      Marland Kitchen NEXIUM 20 MG capsule take 1 capsule by mouth twice a day 60 capsule 5  . omeprazole (PRILOSEC) 20 MG capsule     . potassium chloride SA (K-DUR,KLOR-CON) 20 MEQ tablet Take 2 tablets (40 mEq total) by mouth daily. 60 tablet 0  . traZODone (DESYREL) 50 MG tablet Take 25 mg by mouth at bedtime as needed for sleep. For sleep     No current facility-administered medications for this visit.       Physical Exam Blood pressure 124/82, height 5\' 2"  (1.575 m), weight 129 lb (58.514 kg). Physical Exam The patient is well developed well nourished and well groomed. Normal body habitus Orientation to person place and time is normal  Mood is pleasant.  Ambulatory status she is ambulatory with slight planovalgus foot  Right Ankle examination: Inspection reveals tenderness and swelling over the lateral malleolus. The medial malleolus is swollen with tenderness on that side as well  Range of motion is limited by pain foot is plantigrade. It in valgus. Ankle stability tests were normal ankle does not appear to be subluxated. Motor exam shows no atrophy  Skin shows mild bruising and ecchymosis and erythema. Neurovascular exam is intact.  Opposite ankle shows no deformity. Slight hint of planovalgus at the foot.    Data Reviewed  X-rays were done on a disc they're very dark ankle mortise does appear to be intact there is questionable fracture at the lateral malleolus but indeterminate because of resolution. We will repeat the x-rays today  I reviewed the x-ray and independently interpreted as 3 views of the ankle and i do not see old or new fracture   Assessment    PTTD  Plan    MRI  CHANGE RELAFEN TO DICLOFENAC

## 2015-10-22 ENCOUNTER — Ambulatory Visit (HOSPITAL_COMMUNITY)
Admission: RE | Admit: 2015-10-22 | Discharge: 2015-10-22 | Disposition: A | Discharge status: Home / Self Care | Payer: 59 | Source: Ambulatory Visit | Attending: Orthopedic Surgery | Admitting: Orthopedic Surgery

## 2015-10-22 DIAGNOSIS — R600 Localized edema: Secondary | ICD-10-CM | POA: Insufficient documentation

## 2015-10-22 DIAGNOSIS — M76829 Posterior tibial tendinitis, unspecified leg: Secondary | ICD-10-CM

## 2015-10-22 DIAGNOSIS — M6789 Other specified disorders of synovium and tendon, multiple sites: Secondary | ICD-10-CM | POA: Insufficient documentation

## 2015-11-06 ENCOUNTER — Ambulatory Visit: Payer: 59 | Admitting: Orthopedic Surgery

## 2015-11-09 ENCOUNTER — Encounter: Payer: Self-pay | Admitting: Orthopedic Surgery

## 2015-11-09 ENCOUNTER — Ambulatory Visit (INDEPENDENT_AMBULATORY_CARE_PROVIDER_SITE_OTHER): Payer: 59 | Admitting: Orthopedic Surgery

## 2015-11-09 VITALS — BP 132/76 | Ht 62.0 in | Wt 125.0 lb

## 2015-11-09 DIAGNOSIS — M76829 Posterior tibial tendinitis, unspecified leg: Secondary | ICD-10-CM

## 2015-11-09 DIAGNOSIS — M6789 Other specified disorders of synovium and tendon, multiple sites: Secondary | ICD-10-CM | POA: Diagnosis not present

## 2015-11-09 NOTE — Progress Notes (Signed)
Patient ID: Heather Vaughan, female   DOB: April 16, 1956, 60 y.o.   MRN: [id number]  Chief Complaint  Patient presents with  . Follow-up    REVIEW MRI RT ANKLE    HPI  History of ankle injury then persistent pain MRI showed some sinus tarsi syndrome findings but otherwise edema in the ankle significant change from what was seen in 2015  ROS  Denies numbness or tingling but still has pain around the ankle joint  BP 132/76 mmHg  Ht 5\' 2"  (1.575 m)  Wt 125 lb (56.7 kg)  BMI 22.86 kg/m2   The foot is severely flat pronation valgus is noted it does not appear to be rigid. She has tenderness on the sinus tarsi area and then swelling and tenderness of the posterior tibial tendon She has weakness and poor pushoff and tiptoe test Ankle joint stable Neurovascular exam is normal Gait shows a limp    ASSESSMENT AND PLAN    MRI report findings  IMPRESSION: Chronic subcutaneous edema about the ankle does not appear notably changed compared to the 2015 exam and may be due to dependent change.   Negative for tendon or ligament tear. No acute or focal abnormality.   Findings which may be due to mild sinus tarsi syndrome, unchanged since 2015.     Electronically Signed   By: Inge Rise M.D.   On: 10/22/2015 11:34   Our plan is to do a cam walking boot and out of work 3 weeks I recommend 6 she said she needs to go to work she is to continue ice follow-up in 3 weeks reassess

## 2015-11-09 NOTE — Patient Instructions (Signed)
ICE DAILY 20  MINUTES  OUT OF WORK X 3 WEEKS

## 2015-11-17 ENCOUNTER — Other Ambulatory Visit: Payer: Self-pay | Admitting: Orthopedic Surgery

## 2015-11-17 DIAGNOSIS — M25571 Pain in right ankle and joints of right foot: Secondary | ICD-10-CM

## 2015-11-30 ENCOUNTER — Ambulatory Visit (INDEPENDENT_AMBULATORY_CARE_PROVIDER_SITE_OTHER): Payer: 59 | Admitting: Orthopedic Surgery

## 2015-11-30 ENCOUNTER — Encounter: Payer: Self-pay | Admitting: Orthopedic Surgery

## 2015-11-30 VITALS — BP 134/80 | HR 81 | Ht 62.0 in | Wt 125.0 lb

## 2015-11-30 DIAGNOSIS — M6789 Other specified disorders of synovium and tendon, multiple sites: Secondary | ICD-10-CM | POA: Diagnosis not present

## 2015-11-30 DIAGNOSIS — M76829 Posterior tibial tendinitis, unspecified leg: Secondary | ICD-10-CM

## 2015-11-30 NOTE — Progress Notes (Signed)
Patient ID: Heather Vaughan, female   DOB: Dec 04, 1955, 59 y.o.   MRN: [id number]  Chief Complaint  Patient presents with  . Follow-up    3 week recheck on right ankle.    HPI-60 year old female with persistent pain in her right ankle we got an MRI did not show any peritoneal tendon or posterior tibial tendon dysfunction however she has a planovalgus foot and continued lateral and medial pain  ROS denies numbness or tingling  BP 134/80 mmHg  Pulse 81  Ht 5\' 2"  (1.575 m)  Wt 125 lb (56.7 kg)  BMI 22.86 kg/m2  Physical Exam  Constitutional: She is oriented to person, place, and time. She appears well-developed and well-nourished. No distress.  Cardiovascular: Normal rate and intact distal pulses.   Neurological: She is alert and oriented to person, place, and time. She has normal reflexes. She exhibits normal muscle tone. Coordination normal.  Skin: Skin is warm and dry. No rash noted. She is not diaphoretic. No erythema. No pallor.  Psychiatric: She has a normal mood and affect. Her behavior is normal. Judgment and thought content normal.    Ortho Exam  Medial ankle tenderness swelling. Lateral ankle sinus tarsi tenderness. Flexible pronation of the foot.  ASSESSMENT AND PLAN   Posterior tibial tendon dysfunction without tear  Continue Cam Walker out of work 3 weeks restart diclofenac 50 mg twice a day follow-up 3 weeks

## 2015-11-30 NOTE — Patient Instructions (Signed)
OOW NOTE  

## 2015-12-01 ENCOUNTER — Ambulatory Visit: Payer: 59

## 2015-12-04 ENCOUNTER — Ambulatory Visit: Payer: 59

## 2015-12-15 ENCOUNTER — Other Ambulatory Visit (HOSPITAL_COMMUNITY): Payer: Self-pay | Admitting: Oncology

## 2015-12-21 ENCOUNTER — Encounter: Payer: Self-pay | Admitting: Orthopedic Surgery

## 2015-12-21 ENCOUNTER — Ambulatory Visit (INDEPENDENT_AMBULATORY_CARE_PROVIDER_SITE_OTHER): Payer: 59 | Admitting: Orthopedic Surgery

## 2015-12-21 VITALS — BP 126/89 | Ht 62.0 in | Wt 133.0 lb

## 2015-12-21 DIAGNOSIS — M6789 Other specified disorders of synovium and tendon, multiple sites: Secondary | ICD-10-CM

## 2015-12-21 DIAGNOSIS — M25571 Pain in right ankle and joints of right foot: Secondary | ICD-10-CM

## 2015-12-21 DIAGNOSIS — M76829 Posterior tibial tendinitis, unspecified leg: Secondary | ICD-10-CM

## 2015-12-21 NOTE — Progress Notes (Signed)
Patient ID: Heather Vaughan, female   DOB: 19-Jan-1956, 60 y.o.   MRN: [id number]  Chief Complaint  Patient presents with  . Follow-up    follow up right ankle    HPI-HPI-60 year old female with persistent pain in her right ankle we got an MRI did not show any peritoneal tendon or posterior tibial tendon dysfunction however she has a planovalgus foot and continued lateral and medial pain She continues to have lateral gutter pain swelling in the foot planovalgus foot ROS.  No numbness or tingling in the foot  BP 126/89 mmHg  Ht 5\' 2"  (1.575 m)  Wt 133 lb (60.328 kg)  BMI 24.32 kg/m2  Physical Exam  Constitutional: She is oriented to person, place, and time. She appears well-developed and well-nourished. No distress.  Cardiovascular: Normal rate and intact distal pulses.   Neurological: She is alert and oriented to person, place, and time. She has normal reflexes. She exhibits normal muscle tone. Coordination normal.  Skin: Skin is warm and dry. No rash noted. She is not diaphoretic. No erythema. No pallor.  Psychiatric: She has a normal mood and affect. Her behavior is normal. Judgment and thought content normal.    Ortho Exam Planovalgus foot tenderness in the subtalar joint swelling of the foot posterior tibial tendon is nontender but just proximal to the malleolus is a red area which is tender  Plantar flexion and dorsiflexion strength is normal   ASSESSMENT AND PLAN   Pain right foot  Continue brace continue out of work  Injection  Referral to Dr. Shelly Coss joint injection  A steroid injection was performed at subtalar area using 1% plain Lidocaine and 40 mg Depo-Medrol/ This was well tolerated.  Return in 3 weeks

## 2015-12-21 NOTE — Patient Instructions (Signed)
Out of work note

## 2015-12-29 ENCOUNTER — Telehealth: Payer: Self-pay | Admitting: *Deleted

## 2015-12-29 NOTE — Telephone Encounter (Signed)
Heather Vaughan from Dr Arvil Persons office called and states that Dr Caprice Beaver does not want patient to return to work until PT is completed. Their office will take care of note as he made the determination to stay out of work.

## 2016-01-11 ENCOUNTER — Ambulatory Visit: Payer: 59 | Admitting: Orthopedic Surgery

## 2016-01-26 ENCOUNTER — Ambulatory Visit (HOSPITAL_COMMUNITY): Payer: 59 | Admitting: Oncology

## 2016-01-26 ENCOUNTER — Other Ambulatory Visit (HOSPITAL_COMMUNITY): Payer: 59

## 2016-01-27 ENCOUNTER — Encounter (HOSPITAL_COMMUNITY): Payer: 59 | Attending: Hematology & Oncology

## 2016-01-27 ENCOUNTER — Other Ambulatory Visit (HOSPITAL_COMMUNITY): Payer: 59

## 2016-01-27 ENCOUNTER — Encounter (HOSPITAL_BASED_OUTPATIENT_CLINIC_OR_DEPARTMENT_OTHER): Payer: 59 | Admitting: Oncology

## 2016-01-27 ENCOUNTER — Ambulatory Visit (HOSPITAL_COMMUNITY): Payer: 59 | Admitting: Oncology

## 2016-01-27 ENCOUNTER — Encounter (HOSPITAL_COMMUNITY): Payer: Self-pay | Admitting: Oncology

## 2016-01-27 ENCOUNTER — Other Ambulatory Visit (HOSPITAL_COMMUNITY): Payer: Self-pay | Admitting: Oncology

## 2016-01-27 ENCOUNTER — Other Ambulatory Visit (HOSPITAL_COMMUNITY): Payer: Self-pay | Admitting: *Deleted

## 2016-01-27 VITALS — BP 144/69 | HR 63 | Temp 98.3°F | Resp 16 | Wt 133.6 lb

## 2016-01-27 DIAGNOSIS — R911 Solitary pulmonary nodule: Secondary | ICD-10-CM

## 2016-01-27 DIAGNOSIS — C773 Secondary and unspecified malignant neoplasm of axilla and upper limb lymph nodes: Secondary | ICD-10-CM

## 2016-01-27 DIAGNOSIS — M8580 Other specified disorders of bone density and structure, unspecified site: Secondary | ICD-10-CM

## 2016-01-27 DIAGNOSIS — E538 Deficiency of other specified B group vitamins: Secondary | ICD-10-CM

## 2016-01-27 DIAGNOSIS — E876 Hypokalemia: Secondary | ICD-10-CM

## 2016-01-27 DIAGNOSIS — K5900 Constipation, unspecified: Secondary | ICD-10-CM

## 2016-01-27 DIAGNOSIS — C50912 Malignant neoplasm of unspecified site of left female breast: Secondary | ICD-10-CM | POA: Diagnosis not present

## 2016-01-27 HISTORY — DX: Deficiency of other specified B group vitamins: E53.8

## 2016-01-27 LAB — FOLATE: Folate: 32.5 ng/mL (ref >5.9)

## 2016-01-27 LAB — COMPREHENSIVE METABOLIC PANEL
ALBUMIN: 4.4 g/dL (ref 3.5–5.0)
ALT: 18 U/L (ref 14–54)
AST: 22 U/L (ref 15–41)
Alkaline Phosphatase: 69 U/L (ref 38–126)
Anion gap: 7 (ref 5–15)
BILIRUBIN TOTAL: 0.6 mg/dL (ref 0.3–1.2)
BUN: 10 mg/dL (ref 6–20)
CHLORIDE: 104 mmol/L (ref 101–111)
CO2: 28 mmol/L (ref 22–32)
CREATININE: 0.69 mg/dL (ref 0.44–1.00)
Calcium: 8.8 mg/dL — ABNORMAL LOW (ref 8.9–10.3)
GFR calc Af Amer: >60 mL/min (ref >60)
GLUCOSE: 98 mg/dL (ref 65–99)
POTASSIUM: 3 mmol/L — AB (ref 3.5–5.1)
Sodium: 139 mmol/L (ref 135–145)
TOTAL PROTEIN: 7 g/dL (ref 6.5–8.1)

## 2016-01-27 LAB — CBC WITH DIFFERENTIAL/PLATELET
BASOS ABS: 0 10*3/uL (ref 0.0–0.1)
BASOS PCT: 1 %
Eosinophils Absolute: 0.3 10*3/uL (ref 0.0–0.7)
Eosinophils Relative: 5 %
HEMATOCRIT: 36.5 % (ref 36.0–46.0)
Hemoglobin: 12.1 g/dL (ref 12.0–15.0)
LYMPHS PCT: 38 %
Lymphs Abs: 1.9 10*3/uL (ref 0.7–4.0)
MCH: 34.5 pg — ABNORMAL HIGH (ref 26.0–34.0)
MCHC: 33.2 g/dL (ref 30.0–36.0)
MCV: 104 fL — AB (ref 78.0–100.0)
Monocytes Absolute: 0.4 10*3/uL (ref 0.1–1.0)
Monocytes Relative: 8 %
NEUTROS ABS: 2.4 10*3/uL (ref 1.7–7.7)
NEUTROS PCT: 48 %
Platelets: 208 10*3/uL (ref 150–400)
RBC: 3.51 MIL/uL — AB (ref 3.87–5.11)
RDW: 12.6 % (ref 11.5–15.5)
WBC: 4.9 10*3/uL (ref 4.0–10.5)

## 2016-01-27 LAB — VITAMIN B12: VITAMIN B 12: 214 pg/mL (ref 180–914)

## 2016-01-27 MED ORDER — POTASSIUM CHLORIDE CRYS ER 20 MEQ PO TBCR: 40.0000 meq | EXTENDED_RELEASE_TABLET | Freq: Every day | ORAL | Status: DC

## 2016-01-27 MED ORDER — ALENDRONATE SODIUM 70 MG PO TABS: 70.0000 mg | ORAL_TABLET | ORAL | Status: DC

## 2016-01-27 NOTE — Assessment & Plan Note (Addendum)
Osteopenia in the setting of AI treatment.  She is on Ca++ and Vit D replacement.  Oncology Flowsheet 09/28/2015  denosumab (PROLIA) [place] 60 mg   She started on Prolia on 10/09/2014.  For 2016, Prolia was affordable.  Now with recent insurance change, Prolia is no longer affordable for the patient in 2017.  Prolia supportive therapy plan deleted.  We discussed other treatment options and she has opted for oral treatment.  She is provided education of oral bisphosphonate therapy.    Risks, benefits, alternatives, and side effects of Fosamax therapy are reviewed.  Rx for Fosamax 70 mg weekly escribed.  She will call with any issues related to this treatment, including cost issue(s).  She is due for another bone density in Feb 2018.

## 2016-01-27 NOTE — Progress Notes (Signed)
[place], PA-C [address] [place] [address]  Breast cancer metastasized to axillary lymph node, left ([employer]) - Plan: CBC with Differential, Comprehensive metabolic panel, CT Chest Wo Contrast, MM Digital Screening Unilat R  Pulmonary nodule - Plan: CT Chest Wo Contrast  Hypokalemia - Plan: potassium chloride SA (K-DUR,KLOR-CON) 20 MEQ tablet  Osteopenia - Plan: alendronate (FOSAMAX) 70 MG tablet  CURRENT THERAPY: Surveillance per NCCN guidelines  INTERVAL HISTORY: Heather Vaughan 60 y.o. 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 S/P left partial mastectomy on 07/03/2013 demonstrating a 1.4 cm invasive component that was ER 74%, PR 16%, Ki-67 52%, and HER2 negative. However, positive margins were noted and therefore on 07/17/2013 she underwent a left modified radical mastectomy and left axillary lymph node dissection. Clear margins were ascertained with 7/26 positive lymph nodes positive for metastatic disease. Testing on the positive lymph node reveals HER2 positivity. OncoType DX score was 29 placing her in the high-end of the intermediate risk group for recurrence. She was therefore started on [facility] therapy on 08/27/2013 and finished TC on 12/18/2013 after 6 cycles. She is S/P radiation therapy by Dr. Pablo Ledger on 01/27/2014- 03/14/2014. She is S/P completion of 52 weeks worth of Herceptin therapy. Now on Aromasin (beginning on on 03/29/2014).     Invasive ductal carcinoma metastasized to axillary lymph node, left   05/28/2013 Imaging Screening mammogram   06/19/2013 Imaging Left diagnostic mammogram   06/25/2013 Imaging CT CAP- borderline enlarged axillary lymph node on left.  Small pulmonary nodules noted but too small for PET or biopsy; surveillance recommended.    07/03/2013 Surgery Left partial mastectomy.  Invasive ductal carcinoma. 1.4 cm.  POSITIVE MARGINS.  Additional extensive DCIS noted.  ER 74%, PR 16%,  Ki-67 52%, HER2 NEGATIVE.   07/17/2013 Surgery Left modified radiacl mastectomy with left axillary node dissection.  Negative residual cancer.  Negative inked margins.  1/12 positive lymph nodes.  FINAL MARGINS NEGATIVE.  LYMPH NODE IS HER2 POSITIVE.   08/13/2013 Imaging MUGA scan shows left ventricular EF of 62%   08/27/2013 - 12/18/2013 Chemotherapy TCH x 6 cycles   01/07/2014 -  Chemotherapy Herceptin x 52 weeks   01/27/2014 - 03/13/2014 Radiation Therapy By Dr. Pablo Ledger   03/29/2014 -  Anti-estrogen oral therapy Aromasin   10/24/2014 Imaging Bone density- BMD as determined from Femur Neck Right is 0.698 g/cm2 with a T-Score of -2.4. This patient is considered osteopenic according to [facility]) criteria. (Lumbar spine was not utilized due to advanced degenerative changes).   11/05/2014 Imaging MUGA- Left ventricular ejection fraction equals 70%. No change from prior.   04/27/2015 Procedure Port-A-Cath removal by Dr. Arnoldo Morale   06/19/2015 Imaging CT chest- Status post left mastectomy, without recurrent or metastatic disease. Decrease in size of right-sided pulmonary nodules, favoring an infectious or inflammatory etiology.     I personally reviewed and went over laboratory results with the patient.  The results are noted within this dictation.  Labs are updated today.  Hypokalemia is noted.  Rx for Kdur will be provided.  She is educated on hypokalemia and the cause for her is her HCTZ-containing antihypertensive that is managed by her primary care provider.    I personally reviewed and went over radiographic studies with the patient.  The results are noted within this dictation.  Mammogram is due in November 2017.  She is following with Dr. Aline Brochure for a posterior tibial tendon  dysfunction without tear.  She saw Dr. Aline Brochure on 12/21/2015 and his recommendations follow:  Continue brace continue out of work  Injection  Referral to Dr. Shelly Coss joint injection  She has been  on Prolia for osteopenia in the setting of AI therapy beginning on 10/09/2014.  Her last dose was on 09/28/2015.  She reports that she does not want this medication any longer as it has become too costly for the 2017 calendar year.  She is interested in learning other options for this treatment.  We discussed IV options and PO options.  She is most interested in oral options.  Therefore, we discussed oral bisphosphonate treatment, namely Fosamax.  We discussed the risks, benefits, alternatives, and side effects of therapy.  I am unsure of the cost of this medication to her.  She will call us with any issues.  She wants to pursue this opportunity.  She notes some constipation.  She reports that she has not tried any OTC intervention at this time.  From a medical oncology standpoint, I cannot explain her constipation.  She will be given a constipation sheet and I have reviewed this with the patient.  I have reviewed the recommended constipation medications that are noted on the typed sheet.     Review of Systems  Constitutional: Negative.   HENT: Negative.   Eyes: Negative.  Negative for blurred vision and double vision.  Respiratory: Negative.  Negative for cough, sputum production and shortness of breath.   Cardiovascular: Negative.  Negative for chest pain.  Gastrointestinal: Negative.  Negative for nausea, vomiting, abdominal pain, diarrhea, constipation, blood in stool and melena.  Genitourinary: Negative.  Negative for dysuria, urgency, frequency and hematuria.  Musculoskeletal: Negative.  Negative for myalgias, joint pain and falls.       Continues with ankle/foot pain  Skin: Negative.   Neurological: Negative.  Negative for sensory change, speech change, focal weakness, seizures, loss of consciousness and headaches.  Endo/Heme/Allergies: Negative.   Psychiatric/Behavioral: Negative.     Past Medical History  Diagnosis Date  . GERD (gastroesophageal reflux disease)   . Hypertension   .  Bronchitis 07/27/2011  . Barrett's esophagus   . Anxiety   . Breast cancer, left breast ([facility])   . Osteopenia 10/09/2014  . Breast cancer [facility])     Past Surgical History  Procedure Laterality Date  . Excision morton's neuroma  07/14/2011    Procedure: EXCISION MORTON'S NEUROMA;  Surgeon: Marcheta Grammes;  Location: [facility] ORS;  Service: Orthopedics;  Laterality: Right;  Removal Neuroma 3rd Interspace Right Foot  . Esophagogastroduodenoscopy  08/03/2011    Procedure: ESOPHAGOGASTRODUODENOSCOPY (EGD);  Surgeon: Rogene Houston, MD;  Location: [facility] END[facility] SU[facility];  Service: Endoscopy;  Laterality: N/A;  12:45  . Partial mastectomy with needle localization Left 07/03/2013    Procedure: PARTIAL MASTECTOMY STATUS POST NEEDLE LOCALIZATION;  Surgeon: Scherry Ran, MD;  Location: [facility];  Service: General;  Laterality: Left;  . Breast biopsy Bilateral     X 4- benign  . Mastectomy modified radical Left 07/17/2013    Procedure: MASTECTOMY MODIFIED RADICAL WITH LEFT AXILLARY TISSUE REMOVAL;  Surgeon: Scherry Ran, MD;  Location: [facility] OR[facility];  Service: General;  Laterality: Left;  . Portacath placement Right 08/16/2013    Procedure: INSERTION PORT-A-CATH RIGHT SUBCLAVIAN;  Surgeon: Scherry Ran, MD;  Location: [facility];  Service: General;  Laterality: Right;  . Port-a-cath removal Right 04/27/2015    Procedure: REMOVAL PORT-A-CATH;  Surgeon: Aviva Signs, MD;  Location: [facility];  Service: General;  Laterality: Right;    Family History  Problem Relation Age of Onset  . Anesthesia problems Neg Hx   . Hypotension Neg Hx   . Malignant hyperthermia Neg Hx   . Pseudochol deficiency Neg Hx     Social History   Social History  . Marital Status: Married    Spouse Name: N/A  . Number of Children: N/A  . Years of Education: N/A   Social History Main Topics  . Smoking status: Former Smoker -- 1.00 packs/day for 20 years    Types: Cigarettes    Quit date: 09/05/1993  . Smokeless tobacco: Never  Used  . Alcohol Use: No     Comment: occasionally  . Drug Use: No  . Sexual Activity: Not Asked   Other Topics Concern  . None   Social History Narrative     PHYSICAL EXAMINATION  ECOG PERFORMANCE STATUS: 0 - Asymptomatic  Filed Vitals:   01/27/16 0946  BP: 144/69  Pulse: 63  Temp: 98.3 F (36.8 C)  Resp: 16    GENERAL:alert, no distress, well nourished, well developed, comfortable, cooperative, smiling and unaccompanied SKIN: skin color, texture, turgor are normal, no rashes or significant lesions HEAD: Normocephalic, No masses, lesions, tenderness or abnormalities EYES: normal, EOMI, Conjunctiva are pink and non-injected EARS: External ears normal OROPHARYNX:lips, buccal mucosa, and tongue normal, dentition normal and mucous membranes are moist  NECK: supple, no adenopathy, thyroid normal size, non-tender, without nodularity, no stridor, non-tender, trachea midline LYMPH:  no palpable lymphadenopathy, no hepatosplenomegaly BREAST:right breast normal without mass, skin or nipple changes or axillary nodes, left post-mastectomy site well healed and free of suspicious changes LUNGS: clear to auscultation and percussion HEART: regular rate & rhythm, no murmurs, no gallops, S1 normal and S2 normal ABDOMEN:abdomen soft, non-tender, normal bowel sounds and no masses or organomegaly BACK: Back symmetric, no curvature. EXTREMITIES:less then 2 second capillary refill, no joint deformities, effusion, or inflammation, no skin discoloration, no clubbing, no cyanosis  NEURO: alert & oriented x 3 with fluent speech, no focal motor/sensory deficits, gait normal   LABORATORY DATA: CBC    Component Value Date/Time   WBC 4.9 01/27/2016 0920   RBC 3.51* 01/27/2016 0920   RBC 2.41* 01/13/2014 1000   HGB 12.1 01/27/2016 0920   HCT 36.5 01/27/2016 0920   PLT 208 01/27/2016 0920   MCV 104.0* 01/27/2016 0920   MCH 34.5* 01/27/2016 0920   MCHC 33.2 01/27/2016 0920   RDW 12.6 01/27/2016  0920   LYMPHSABS 1.9 01/27/2016 0920   MONOABS 0.4 01/27/2016 0920   EOSABS 0.3 01/27/2016 0920   BASOSABS 0.0 01/27/2016 0920      Chemistry      Component Value Date/Time   NA 139 01/27/2016 0920   K 3.0* 01/27/2016 0920   CL 104 01/27/2016 0920   CO2 28 01/27/2016 0920   BUN 10 01/27/2016 0920   CREATININE 0.69 01/27/2016 0920      Component Value Date/Time   CALCIUM 8.8* 01/27/2016 0920   ALKPHOS 69 01/27/2016 0920   AST 22 01/27/2016 0920   ALT 18 01/27/2016 0920   BILITOT 0.6 01/27/2016 0920        PENDING LABS:   RADIOGRAPHIC STUDIES:  No results found.   PATHOLOGY:    ASSESSMENT AND PLAN:  Invasive ductal carcinoma metastasized to axillary lymph node, left Invasive ductal carcinoma of left breast with high grade DCIS. S/P left partial mastectomy on 07/03/2013 demonstrating a 1.4  cm invasive component that was ER 74%, PR 16%, Ki-67 52%, and HER2 negative. However, positive margins were noted and therefore on 07/17/2013 she underwent a left modified radical mastectomy and left axillary lymph node dissection. Clear margins were ascertained with 8/03 positive lymph nodes positive for metastatic disease. Testing on the positive lymph node reveals HER2 positivity. OncoType DX score was 29 placing her in the high-end of the intermediate risk group for recurrence. She was therefore started on [facility] therapy on 08/27/2013 and finished TC on 12/18/2013 after 6 cycles. She is S/P radiation therapy by Dr. Pablo Ledger on 01/27/2014- 03/14/2014. She is S/P completion of 52 weeks worth of Herceptin therapy.  Now on Aromasin (beginning on on 03/29/2014).    Oncology history is up-to-date.  Continue Aromasin daily.  Compliance encouraged.   Labs today: CBC diff, CMET, Vit D, Vit B12, and Folate.  B12 and Folate were performed for macrocytosis.  She notes constipation and therefore I reviewed and provided the patient a "constipation sheet" with recommendations.   Hypokalemia is noted  today.  "Is low potassium bad?" she asked.  She is provided education regarding K+ and its importance in physiologic functioning.  She is educated on signs and symptoms, and issues that have a risk of presenting, of low potassium.  Kdur 40 mEq daily is escribed.  She is encouraged to touch base with her primary care provider regarding this issue as it is likely from her HCTZ-containing antihypertensive.  I would recommend daily K+ replacement/supplementation.    Labs in 4 months: CBC diff, CMET  She is due for mammogram in November 2017.  Orders are placed for this test.  Given her history of breast cancer with pulmonary nodules, which have been stable, it is not unreasonable to perform another CT of chest in October 2017.  Order is placed for this imaging test as well.  Return in 4 months for follow-up.    Osteopenia Osteopenia in the setting of AI treatment.  She is on Ca++ and Vit D replacement.  Oncology Flowsheet 09/28/2015  denosumab (PROLIA) [place] 60 mg   She started on Prolia on 10/09/2014.  For 2016, Prolia was affordable.  Now with recent insurance change, Prolia is no longer affordable for the patient in 2017.  Prolia supportive therapy plan deleted.  We discussed other treatment options and she has opted for oral treatment.  She is provided education of oral bisphosphonate therapy.    Risks, benefits, alternatives, and side effects of Fosamax therapy are reviewed.  Rx for Fosamax 70 mg weekly escribed.  She will call with any issues related to this treatment, including cost issue(s).  She is due for another bone density in Feb 2018.    ORDERS PLACED FOR THIS ENCOUNTER: Orders Placed This Encounter  Procedures  . CT Chest Wo Contrast  . MM Digital Screening Unilat R  . CBC with Differential  . Comprehensive metabolic panel    MEDICATIONS PRESCRIBED THIS ENCOUNTER: Meds ordered this encounter  Medications  . celecoxib (CELEBREX) 200 MG capsule    Sig: Take 200 mg by  mouth daily.    Refill:  0  . fluticasone (FLONASE) 50 MCG/ACT nasal spray    Sig: instill 2 sprays into each nostril once daily    Refill:  1  . ipratropium (ATROVENT) 0.03 % nasal spray    Sig: instill 2 sprays into each nostril three times a day    Refill:  0  . potassium chloride SA (K-DUR,KLOR-CON) 20 MEQ tablet  Sig: Take 2 tablets (40 mEq total) by mouth daily.    Dispense:  60 tablet    Refill:  0    Future refills by primary care physician. Patient on a hydrochlorothiazide containing antihypertensive.    Order Specific Question:  Supervising Provider    Answer:  Patrici Ranks [[id number]]  . alendronate (FOSAMAX) 70 MG tablet    Sig: Take 1 tablet (70 mg total) by mouth once a week. Take with a full glass of water on an empty stomach.    Dispense:  12 tablet    Refill:  2    Order Specific Question:  Supervising Provider    Answer:  Patrici Ranks [id number]    THERAPY PLAN:  NCCN guidelines recommends the following surveillance for invasive breast cancer (2.2017):  A. History and Physical exam 1-4 times per year as clinically appropriate for 5 years, then annually.  B. Periodic screening for changes in family history and referral to genetics counseling as indicated  C. Educate, monitor, and refer to lymphedema management.  D. Mammography every 12 months  E. Routine imaging of reconstructed breast is not indicated.  F. In the absence of clinical signs and symptoms suggestive of recurrent disease, there is no indication for laboratory or imaging studies for metastases screening.  G. Women on Tamoxifen: annual gynecologic assessment every 12 months if uterus is present.  H. Women on aromatase inhibitor or who experience ovarian failure secondary to treatment should have monitoring of bone health with a bone mineral density determination at baseline and periodically thereafter.  I. Assess and encourage adherence to adjuvant endocrine therapy.  J. Evidence suggests that  active lifestyle, healthy diet, limited alcohol intake, and achieving and maintaining an ideal body weight (20-25 BMI) may lead to optimal breast cancer outcomes.   All questions were answered. The patient knows to call the clinic with any problems, questions or concerns. We can certainly see the patient much sooner if necessary.  Patient and plan discussed with Dr. Ancil Linsey and she is in agreement with the aforementioned.   This note is electronically signed by: Doy Mince 01/27/2016 12:36 PM

## 2016-01-27 NOTE — Assessment & Plan Note (Addendum)
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 S/P left partial mastectomy on 07/03/2013 demonstrating a 1.4 cm invasive component that was ER 74%, PR 16%, Ki-67 52%, and HER2 negative. However, positive margins were noted and therefore on 07/17/2013 she underwent a left modified radical mastectomy and left axillary lymph node dissection. Clear margins were ascertained with 9/21 positive lymph nodes positive for metastatic disease. Testing on the positive lymph node reveals HER2 positivity. OncoType DX score was 29 placing her in the high-end of the intermediate risk group for recurrence. She was therefore started on [facility] therapy on 08/27/2013 and finished TC on 12/18/2013 after 6 cycles. She is S/P radiation therapy by Dr. Pablo Ledger on 01/27/2014- 03/14/2014. She is S/P completion of 52 weeks worth of Herceptin therapy.  Now on Aromasin (beginning on on 03/29/2014).    Oncology history is up-to-date.  Continue Aromasin daily.  Compliance encouraged.   Labs today: CBC diff, CMET, Vit D, Vit B12, and Folate.  B12 and Folate were performed for macrocytosis.  She notes constipation and therefore I reviewed and provided the patient a "constipation sheet" with recommendations.   Hypokalemia is noted today.  "Is low potassium bad?" she asked.  She is provided education regarding K+ and its importance in physiologic functioning.  She is educated on signs and symptoms, and issues that have a risk of presenting, of low potassium.  Kdur 40 mEq daily is escribed.  She is encouraged to touch base with her primary care provider regarding this issue as it is likely from her HCTZ-containing antihypertensive.  I would recommend daily K+ replacement/supplementation.    Labs in 4 months: CBC diff, CMET  She is due for mammogram in November 2017.  Orders are placed for this test.  Given her history of breast cancer with pulmonary nodules, which have been stable, it is not unreasonable to perform another CT of  chest in October 2017.  Order is placed for this imaging test as well.  Return in 4 months for follow-up.

## 2016-01-27 NOTE — Patient Instructions (Signed)
[place] at [facility] Discharge Instructions  RECOMMENDATIONS MADE BY THE CONSULTANT AND ANY TEST RESULTS WILL BE SENT TO YOUR REFERRING PHYSICIAN.  We are giving you a constipation sheet - please review and use these medications to relieve your constipation  KDUR 20meq daily - escribed [facility]. This is for potassium replacement.  Labs in 4 months & MD visit in 4 months  Continue Aromasin daily  Mammogram in November  CT chest in October  Fosamax weekly - [facility]ribed [facility]. Take once a week.  We are stopping the Prolia.    Thank you for choosing [place] at [facility] to provide your oncology and hematology care.  To afford each patient quality time with our provider, please arrive at least 15 minutes before your scheduled appointment time.   Beginning January 23rd 2017 lab work for the [facility] will be done in the  Main lab at [facility] on 1st floor. If you have a lab appointment with the [place] please come in thru the  Main Entrance and check in at the main information desk  You need to re-schedule your appointment should you arrive 10 or more minutes late.  We strive to give you quality time with our providers, and arriving late affects you and other patients whose appointments are after yours.  Also, if you no show three or more times for appointments you may be dismissed from the clinic at the providers discretion.     Again, thank you for choosing [facility].  Our hope is that these requests will decrease the amount of time that you wait before being seen by our physicians.       _____________________________________________________________  Should you have questions after your visit to [facility], please contact our office at [phone] between the hours of 8:30 a.m. and 4:30 p.m.  Voicemails left after 4:30 p.m. will not be returned until the following business day.  For  prescription refill requests, have your pharmacy contact our office.         Resources For Cancer Patients and their Caregivers ? American Cancer Society: Can assist with transportation, wigs, general needs, runs Look Good Feel Better.        ([phone] ? Cancer Care: Provides financial assistance, online support groups, medication/co-pay assistance.  1-800-813-HOPE 305-540-4274) ? [place] Assists [place] cancer patients and their families through emotional , educational and financial support.  (669)362-8777 ? [place] DSS Where to apply for food stamps, Medicaid and utility assistance. 562-584-8991 ? RCATS: Transportation to medical appointments. 828 453 2840 ? Social Security Administration: May apply for disability if have a Stage IV cancer. 561-844-6361 8022849888 ? [employer], Disability and Transit Services: Assists with nutrition, care and transit needs. [place] Support Programs: @10RELATIVEDAYS @ > Cancer Support Group  2nd Tuesday of the month 1pm-2pm, Journey Room  > Creative Journey  3rd Tuesday of the month 1130am-1pm, Journey Room  > Look Good Feel Better  1st Wednesday of the month 10am-12 noon, Journey Room (Call American Cancer Society to register [phone])  Alendronate weekly tablets What is this medicine? ALENDRONATE (a LEN droe nate) slows calcium loss from bones. It helps to make healthy bone and to slow bone loss in people with osteoporosis. It may be used to treat Paget's disease. This medicine may be used for other purposes; ask your health care provider or pharmacist if you have questions.  What should I tell my health care provider before I take this medicine? They need to know if you have any of these conditions: -esophagus, stomach, or intestine problems, like acid-reflux or GERD -dental disease -kidney disease -low blood calcium -low vitamin D -problems swallowing -problems  sitting or standing for 30 minutes -an unusual or allergic reaction to alendronate, other medicines, foods, dyes, or preservatives -pregnant or trying to get pregnant -breast-feeding How should I use this medicine? You must take this medicine exactly as directed or you will lower the amount of medicine you absorb into your body or you may cause yourself harm. Take your dose by mouth first thing in the morning, after you are up for the day. Do not eat or drink anything before you take this medicine. Swallow your medicine with a full glass (6 to 8 fluid ounces) of plain water. Do not take this tablet with any other drink. Do not chew or crush the tablet. After taking this medicine, do not eat breakfast, drink, or take any medicines or vitamins for at least 30 minutes. Stand or sit up for at least 30 minutes after you take this medicine; do not lie down. Take this medicine on the same day every week. Do not take your medicine more often than directed. Talk to your pediatrician regarding the use of this medicine in children. Special care may be needed. Overdosage: If you think you have taken too much of this medicine contact a poison control center or emergency room at once. NOTE: This medicine is only for you. Do not share this medicine with others. What if I miss a dose? If you miss a dose, take the dose on the morning after you remember. Then take your next dose on your regular day of the week. Never take 2 tablets on the same day. Do not take double or extra doses. What may interact with this medicine? -aluminum hydroxide -antacids -aspirin -calcium supplements -drugs for inflammation like ibuprofen, naproxen, and others -iron supplements -magnesium supplements -vitamins with minerals This list may not describe all possible interactions. Give your health care provider a list of all the medicines, herbs, non-prescription drugs, or dietary supplements you use. Also tell them if you smoke, drink  alcohol, or use illegal drugs. Some items may interact with your medicine. What should I watch for while using this medicine? Visit your doctor or health care professional for regular checks ups. It may be some time before you see benefit from this medicine. Do not stop taking your medication except on your doctor's advice. Your doctor or health care professional may order blood tests and other tests to see how you are doing. You should make sure you get enough calcium and vitamin D while you are taking this medicine, unless your doctor tells you not to. Discuss the foods you eat and the vitamins you take with your health care professional. Some people who take this medicine have severe bone, joint, and/or muscle pain. This medicine may also increase your risk for a broken thigh bone. Tell your doctor right away if you have pain in your upper leg or groin. Tell your doctor if you have any pain that does not go away or that gets worse. This medicine can make you more sensitive to the sun. If you get a rash while taking this medicine, sunlight may cause the rash to get worse. Keep out of the sun. If you cannot avoid being in the sun, wear protective clothing and use sunscreen. Do  not use sun lamps or tanning beds/booths. What side effects may I notice from receiving this medicine? Side effects that you should report to your doctor or health care professional as soon as possible: -allergic reactions like skin rash, itching or hives, swelling of the face, lips, or tongue -black or tarry stools -bone, muscle or joint pain -changes in vision -chest pain -heartburn or stomach pain -jaw pain, especially after dental work -pain or trouble when swallowing -redness, blistering, peeling or loosening of the skin, including inside the mouth Side effects that usually do not require medical attention (report to your doctor or health care professional if they continue or are bothersome): -changes in  taste -diarrhea or constipation -eye pain or itching -headache -nausea or vomiting -stomach gas or fullness This list may not describe all possible side effects. Call your doctor for medical advice about side effects. You may report side effects to FDA at 1-800-FDA-1088. Where should I keep my medicine? Keep out of the reach of children. Store at room temperature of 15 and 30 degrees C (59 and 86 degrees F). Throw away any unused medicine after the expiration date. NOTE: This sheet is a summary. It may not cover all possible information. If you have questions about this medicine, talk to your doctor, pharmacist, or health care provider.    2016, Elsevier/Gold Standard. (2011-06-30 09:02:42)

## 2016-01-28 ENCOUNTER — Encounter (HOSPITAL_COMMUNITY): Payer: 59 | Attending: Hematology & Oncology

## 2016-01-28 ENCOUNTER — Other Ambulatory Visit (HOSPITAL_COMMUNITY): Payer: Self-pay | Admitting: Oncology

## 2016-01-28 DIAGNOSIS — E538 Deficiency of other specified B group vitamins: Secondary | ICD-10-CM

## 2016-01-28 DIAGNOSIS — M8580 Other specified disorders of bone density and structure, unspecified site: Secondary | ICD-10-CM | POA: Diagnosis not present

## 2016-01-28 DIAGNOSIS — E559 Vitamin D deficiency, unspecified: Secondary | ICD-10-CM

## 2016-01-28 LAB — VITAMIN D 25 HYDROXY (VIT D DEFICIENCY, FRACTURES): Vit D, 25-Hydroxy: 29.1 ng/mL — ABNORMAL LOW (ref 30.0–100.0)

## 2016-01-28 MED ORDER — ERGOCALCIFEROL 1.25 MG (50000 UT) PO CAPS: ORAL_CAPSULE | ORAL | Status: DC

## 2016-01-29 LAB — ANTI-PARIETAL ANTIBODY: PARIETAL CELL ANTIBODY-IGG: 4.2 U (ref 0.0–20.0)

## 2016-01-29 LAB — INTRINSIC FACTOR ANTIBODIES: Intrinsic Factor: 1 AU/mL (ref 0.0–1.1)

## 2016-01-31 LAB — METHYLMALONIC ACID, SERUM: METHYLMALONIC ACID, QUANTITATIVE: 755 nmol/L — AB (ref 0–378)

## 2016-02-01 ENCOUNTER — Other Ambulatory Visit (HOSPITAL_COMMUNITY): Payer: Self-pay | Admitting: Oncology

## 2016-02-01 LAB — HOMOCYSTEINE: HOMOCYSTEINE-NORM: 10.7 umol/L (ref 0.0–15.0)

## 2016-02-05 ENCOUNTER — Encounter (HOSPITAL_BASED_OUTPATIENT_CLINIC_OR_DEPARTMENT_OTHER): Payer: 59

## 2016-02-05 ENCOUNTER — Encounter (HOSPITAL_COMMUNITY): Payer: Self-pay

## 2016-02-05 VITALS — BP 108/70 | HR 110 | Temp 98.0°F | Resp 18

## 2016-02-05 DIAGNOSIS — E538 Deficiency of other specified B group vitamins: Secondary | ICD-10-CM | POA: Diagnosis not present

## 2016-02-05 MED ORDER — CYANOCOBALAMIN 1000 MCG/ML IJ SOLN
1000.0000 ug | Freq: Once | INTRAMUSCULAR | Status: AC
  Administered 2016-02-05: 1000 ug via INTRAMUSCULAR

## 2016-02-05 MED ORDER — CYANOCOBALAMIN 1000 MCG/ML IJ SOLN
INTRAMUSCULAR | Status: AC
  Filled 2016-02-05: qty 1

## 2016-02-05 NOTE — Progress Notes (Unsigned)
Heather Vaughan presents today for injection per the provider's orders.  B12 administration without incident; see MAR for injection details.  Patient tolerated procedure well and without incident.  No questions or complaints noted at this time.

## 2016-02-05 NOTE — Patient Instructions (Signed)
[place] at [facility] Discharge Instructions  RECOMMENDATIONS MADE BY THE CONSULTANT AND ANY TEST RESULTS WILL BE SENT TO YOUR REFERRING PHYSICIAN.  B12 injection today. Return as scheduled for injections. Return as scheduled for lab work and office visit. Call the clinic with any questions or concerns.   Thank you for choosing [place] at [facility] to provide your oncology and hematology care.  To afford each patient quality time with our provider, please arrive at least 15 minutes before your scheduled appointment time.   Beginning January 23rd 2017 lab work for the [facility] will be done in the  Main lab at [facility] on 1st floor. If you have a lab appointment with the [place] please come in thru the  Main Entrance and check in at the main information desk  You need to re-schedule your appointment should you arrive 10 or more minutes late.  We strive to give you quality time with our providers, and arriving late affects you and other patients whose appointments are after yours.  Also, if you no show three or more times for appointments you may be dismissed from the clinic at the providers discretion.     Again, thank you for choosing [facility].  Our hope is that these requests will decrease the amount of time that you wait before being seen by our physicians.       _____________________________________________________________  Should you have questions after your visit to [facility], please contact our office at [phone] between the hours of 8:30 a.m. and 4:30 p.m.  Voicemails left after 4:30 p.m. will not be returned until the following business day.  For prescription refill requests, have your pharmacy contact our office.         Resources For Cancer Patients and their Caregivers ? American Cancer Society: Can assist with transportation, wigs, general needs, runs Look Good Feel  Better.        830-453-8401 ? Cancer Care: Provides financial assistance, online support groups, medication/co-pay assistance.  1-800-813-HOPE 7043001972) ? [place] Assists [place] cancer patients and their families through emotional , educational and financial support.  204-791-4724 ? [place] DSS Where to apply for food stamps, Medicaid and utility assistance. 804-103-5101 ? RCATS: Transportation to medical appointments. 629 241 0940 ? Social Security Administration: May apply for disability if have a Stage IV cancer. [phone] ? [employer], Disability and Transit Services: Assists with nutrition, care and transit needs. [place] Support Programs: @10RELATIVEDAYS @ > Cancer Support Group  2nd Tuesday of the month 1pm-2pm, Journey Room  > Creative Journey  3rd Tuesday of the month 1130am-1pm, Journey Room  > Look Good Feel Better  1st Wednesday of the month 10am-12 noon, Journey Room (Call [place] to register [phone])

## 2016-02-11 ENCOUNTER — Encounter (HOSPITAL_BASED_OUTPATIENT_CLINIC_OR_DEPARTMENT_OTHER): Payer: 59

## 2016-02-11 ENCOUNTER — Encounter (HOSPITAL_COMMUNITY): Payer: Self-pay

## 2016-02-11 VITALS — BP 127/84 | HR 79 | Temp 98.2°F | Resp 16

## 2016-02-11 DIAGNOSIS — E538 Deficiency of other specified B group vitamins: Secondary | ICD-10-CM

## 2016-02-11 MED ORDER — CYANOCOBALAMIN 1000 MCG/ML IJ SOLN
1000.0000 ug | Freq: Once | INTRAMUSCULAR | Status: AC
  Administered 2016-02-11: 1000 ug via INTRAMUSCULAR

## 2016-02-11 NOTE — Patient Instructions (Signed)
[place] at [facility] Discharge Instructions  RECOMMENDATIONS MADE BY THE CONSULTANT AND ANY TEST RESULTS WILL BE SENT TO YOUR REFERRING PHYSICIAN.  B12 injection given today per orders. Follow up as schedule.  Thank you for choosing [place] at [facility] to provide your oncology and hematology care.  To afford each patient quality time with our provider, please arrive at least 15 minutes before your scheduled appointment time.   Beginning January 23rd 2017 lab work for the [facility] will be done in the  Main lab at [facility] on 1st floor. If you have a lab appointment with the [place] please come in thru the  Main Entrance and check in at the main information desk  You need to re-schedule your appointment should you arrive 10 or more minutes late.  We strive to give you quality time with our providers, and arriving late affects you and other patients whose appointments are after yours.  Also, if you no show three or more times for appointments you may be dismissed from the clinic at the providers discretion.     Again, thank you for choosing [facility].  Our hope is that these requests will decrease the amount of time that you wait before being seen by our physicians.       _____________________________________________________________  Should you have questions after your visit to [facility], please contact our office at [phone] between the hours of 8:30 a.m. and 4:30 p.m.  Voicemails left after 4:30 p.m. will not be returned until the following business day.  For prescription refill requests, have your pharmacy contact our office.         Resources For Cancer Patients and their Caregivers ? American Cancer Society: Can assist with transportation, wigs, general needs, runs Look Good Feel Better.        ([phone] ? Cancer Care: Provides financial assistance, online support groups,  medication/co-pay assistance.  1-800-813-HOPE 918-321-1364) ? [place] Assists [place] cancer patients and their families through emotional , educational and financial support.  402-675-1410 ? [place] DSS Where to apply for food stamps, Medicaid and utility assistance. 437-423-7125 ? RCATS: Transportation to medical appointments. 8171023676 ? Social Security Administration: May apply for disability if have a Stage IV cancer. ([phone] 743-495-1625 ? [employer], Disability and Transit Services: Assists with nutrition, care and transit needs. [place] Support Programs: @10RELATIVEDAYS @ > Cancer Support Group  2nd Tuesday of the month 1pm-2pm, Journey Room  > Creative Journey  3rd Tuesday of the month 1130am-1pm, Journey Room  > Look Good Feel Better  1st Wednesday of the month 10am-12 noon, Journey Room (Call [place] to register [phone])

## 2016-02-11 NOTE — Progress Notes (Signed)
Heather Vaughan presents today for injection per MD orders. B12 1,030mcg administered SQ in right Upper Arm. Administration without incident. Patient tolerated well.

## 2016-02-19 ENCOUNTER — Encounter (HOSPITAL_COMMUNITY): Payer: Self-pay

## 2016-02-19 ENCOUNTER — Encounter (HOSPITAL_BASED_OUTPATIENT_CLINIC_OR_DEPARTMENT_OTHER): Payer: 59

## 2016-02-19 VITALS — BP 126/76 | HR 62 | Resp 18

## 2016-02-19 DIAGNOSIS — E538 Deficiency of other specified B group vitamins: Secondary | ICD-10-CM

## 2016-02-19 MED ORDER — CYANOCOBALAMIN 1000 MCG/ML IJ SOLN
1000.0000 ug | Freq: Once | INTRAMUSCULAR | Status: AC
  Administered 2016-02-19: 1000 ug via INTRAMUSCULAR

## 2016-02-19 NOTE — Progress Notes (Signed)
Heather Vaughan presents today for injection per MD orders. B12 1061mcg administered SQ in right Upper Arm. Administration without incident. Patient tolerated well.

## 2016-02-19 NOTE — Patient Instructions (Signed)
[place] at [facility] Discharge Instructions  RECOMMENDATIONS MADE BY THE CONSULTANT AND ANY TEST RESULTS WILL BE SENT TO YOUR REFERRING PHYSICIAN.  B12 given today.  Follow up scheduled  Thank you for choosing [place] at [facility] to provide your oncology and hematology care.  To afford each patient quality time with our provider, please arrive at least 15 minutes before your scheduled appointment time.   Beginning January 23rd 2017 lab work for the [facility] will be done in the  Main lab at [facility] on 1st floor. If you have a lab appointment with the [place] please come in thru the  Main Entrance and check in at the main information desk  You need to re-schedule your appointment should you arrive 10 or more minutes late.  We strive to give you quality time with our providers, and arriving late affects you and other patients whose appointments are after yours.  Also, if you no show three or more times for appointments you may be dismissed from the clinic at the providers discretion.     Again, thank you for choosing [facility].  Our hope is that these requests will decrease the amount of time that you wait before being seen by our physicians.       _____________________________________________________________  Should you have questions after your visit to [facility], please contact our office at [phone] between the hours of 8:30 a.m. and 4:30 p.m.  Voicemails left after 4:30 p.m. will not be returned until the following business day.  For prescription refill requests, have your pharmacy contact our office.         Resources For Cancer Patients and their Caregivers ? American Cancer Society: Can assist with transportation, wigs, general needs, runs Look Good Feel Better.        ([phone] ? Cancer Care: Provides financial assistance, online support groups, medication/co-pay  assistance.  1-800-813-HOPE 929-209-9147) ? [place] Assists [place] cancer patients and their families through emotional , educational and financial support.  [phone] ? [place] DSS Where to apply for food stamps, Medicaid and utility assistance. 713 088 1288 ? RCATS: Transportation to medical appointments. 862 770 7196 ? Social Security Administration: May apply for disability if have a Stage IV cancer. 240-470-9678 3865000768 ? [employer], Disability and Transit Services: Assists with nutrition, care and transit needs. [place] Support Programs: @10RELATIVEDAYS @ > Cancer Support Group  2nd Tuesday of the month 1pm-2pm, Journey Room  > Creative Journey  3rd Tuesday of the month 1130am-1pm, Journey Room  > Look Good Feel Better  1st Wednesday of the month 10am-12 noon, Journey Room (Call [place] to register [phone])

## 2016-02-25 ENCOUNTER — Encounter (HOSPITAL_BASED_OUTPATIENT_CLINIC_OR_DEPARTMENT_OTHER): Payer: 59

## 2016-02-25 VITALS — BP 122/82 | HR 60 | Temp 98.0°F | Resp 18

## 2016-02-25 DIAGNOSIS — E538 Deficiency of other specified B group vitamins: Secondary | ICD-10-CM | POA: Diagnosis not present

## 2016-02-25 MED ORDER — CYANOCOBALAMIN 1000 MCG/ML IJ SOLN
1000.0000 ug | Freq: Once | INTRAMUSCULAR | Status: AC
  Administered 2016-02-25: 1000 ug via INTRAMUSCULAR

## 2016-02-25 NOTE — Patient Instructions (Signed)
[facility] at [facility] Discharge Instructions  RECOMMENDATIONS MADE BY THE CONSULTANT AND ANY TEST RESULTS WILL BE SENT TO YOUR REFERRING PHYSICIAN.  B12 today.    Thank you for choosing [facility] at [facility] to provide your oncology and hematology care.  To afford each patient quality time with our provider, please arrive at least 15 minutes before your scheduled appointment time.   Beginning January 23rd 2017 lab work for the Cancer Center will be done in the  Main lab at [facility] on 1st floor. If you have a lab appointment with the Cancer Center please come in thru the  Main Entrance and check in at the main information desk  You need to re-schedule your appointment should you arrive 10 or more minutes late.  We strive to give you quality time with our providers, and arriving late affects you and other patients whose appointments are after yours.  Also, if you no show three or more times for appointments you may be dismissed from the clinic at the providers discretion.     Again, thank you for choosing [facility].  Our hope is that these requests will decrease the amount of time that you wait before being seen by our physicians.       _____________________________________________________________  Should you have questions after your visit to  Cancer Center, please contact our office at [phone] between the hours of 8:30 a.m. and 4:30 p.m.  Voicemails left after 4:30 p.m. will not be returned until the following business day.  For prescription refill requests, have your pharmacy contact our office.         Resources For Cancer Patients and their Caregivers ? American Cancer Society: Can assist with transportation, wigs, general needs, runs Look Good Feel Better.        1-888-227-6333 ? Cancer Care: Provides financial assistance, online support groups, medication/co-pay assistance.  1-800-813-HOPE  (4673) ? [facility] Assists [place] cancer patients and their families through emotional , educational and financial support.  336-427-4357 ? [place] DSS Where to apply for food stamps, Medicaid and utility assistance. 336-342-1394 ? RCATS: Transportation to medical appointments. 336-347-2287 ? Social Security Administration: May apply for disability if have a Stage IV cancer. 336-342-7796 1-800-772-1213 ? [place] Aging, Disability and Transit Services: Assists with nutrition, care and transit needs. 336-349-2343  Cancer Center Support Programs: @10RELATIVEDAYS@ > Cancer Support Group  2nd Tuesday of the month 1pm-2pm, Journey Room  > Creative Journey  3rd Tuesday of the month 1130am-1pm, Journey Room  > Look Good Feel Better  1st Wednesday of the month 10am-12 noon, Journey Room (Call American Cancer Society to register [phone])    

## 2016-02-25 NOTE — Progress Notes (Signed)
Tamala Bari presents today for injection per MD orders. B12 1087mcg administered IM in right Upper Arm. Administration without incident. Patient tolerated well.

## 2016-03-05 IMAGING — NM NM CARDIA MUGA REST
3 series · 18 of 18 positions shown · non-contrast
Comparison: MUGA 11/11/13

CLINICAL DATA: Cardiotoxic chemotherapy.  Breast cancer.

EXAM:
NUCLEAR MEDICINE CARDIAC BLOOD POOL IMAGING (MUGA)
TECHNIQUE: Cardiac multi-gated acquisition was performed at rest following
intravenous injection of Wc-BBm labeled red blood cells.
RADIOPHARMACEUTICALS:  25 mCi [id number] in-vitro labeled red blood
cells.

[Series 1: lao · 8.28mm/px · 6 of 8 frames shown]
[frame 1/8]
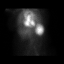
[frame 2/8]
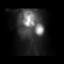
[frame 4/8]
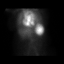
[frame 5/8]
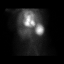
[frame 6/8]
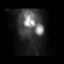
[frame 8/8]
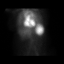

[Series 2: lt lat/anterior · 8.28mm/px · 6 of 8 frames shown (1 of 2)]
[frame 1/8]
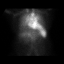
[frame 2/8]
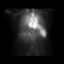
[frame 4/8]
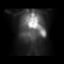
[frame 5/8]
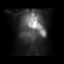
[frame 6/8]
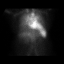
[frame 8/8]
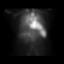

[Series 2: lt lat/anterior · 8.28mm/px · 6 of 8 frames shown (2 of 2)]
[frame 1/8]
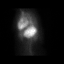
[frame 2/8]
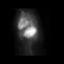
[frame 4/8]
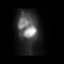
[frame 5/8]
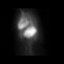
[frame 6/8]
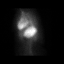
[frame 8/8]
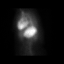

[18 of 18 positions shown; findings below may reference images not displayed]

FINDINGS: No focal wall motion abnormality.

Left ventricular ejection fraction equals 67 % compared to 66% on
11/11/2013.
IMPRESSION: Left ventricular ejection fraction equals 67%. Not changed from
prior.

## 2016-03-15 ENCOUNTER — Other Ambulatory Visit (HOSPITAL_COMMUNITY): Payer: Self-pay | Admitting: Podiatry

## 2016-03-15 ENCOUNTER — Ambulatory Visit (HOSPITAL_COMMUNITY)
Admission: RE | Admit: 2016-03-15 | Discharge: 2016-03-15 | Disposition: A | Discharge status: Home / Self Care | Payer: 59 | Source: Ambulatory Visit | Attending: Podiatry | Admitting: Podiatry

## 2016-03-15 DIAGNOSIS — M25471 Effusion, right ankle: Secondary | ICD-10-CM

## 2016-03-15 DIAGNOSIS — R600 Localized edema: Secondary | ICD-10-CM | POA: Diagnosis not present

## 2016-03-15 DIAGNOSIS — M25571 Pain in right ankle and joints of right foot: Secondary | ICD-10-CM

## 2016-03-29 ENCOUNTER — Other Ambulatory Visit (HOSPITAL_COMMUNITY): Payer: 59

## 2016-03-29 ENCOUNTER — Ambulatory Visit (HOSPITAL_COMMUNITY): Payer: 59

## 2016-05-26 ENCOUNTER — Ambulatory Visit (HOSPITAL_COMMUNITY): Payer: 59 | Admitting: Hematology & Oncology

## 2016-05-26 ENCOUNTER — Other Ambulatory Visit (HOSPITAL_COMMUNITY): Payer: 59

## 2016-05-27 ENCOUNTER — Ambulatory Visit (HOSPITAL_COMMUNITY): Payer: 59 | Admitting: Hematology & Oncology

## 2016-05-27 ENCOUNTER — Other Ambulatory Visit (HOSPITAL_COMMUNITY): Payer: 59

## 2016-05-31 ENCOUNTER — Other Ambulatory Visit (HOSPITAL_COMMUNITY): Payer: Self-pay

## 2016-05-31 DIAGNOSIS — C50912 Malignant neoplasm of unspecified site of left female breast: Secondary | ICD-10-CM

## 2016-05-31 DIAGNOSIS — C773 Secondary and unspecified malignant neoplasm of axilla and upper limb lymph nodes: Principal | ICD-10-CM

## 2016-05-31 MED ORDER — MISC. DEVICES MISC: 0 refills | Status: DC

## 2016-05-31 NOTE — Telephone Encounter (Signed)
Received request for mastectomy bras from [employer]. Chart checked and prescription entered.

## 2016-06-07 ENCOUNTER — Other Ambulatory Visit (HOSPITAL_COMMUNITY): Payer: Self-pay | Admitting: Oncology

## 2016-06-07 DIAGNOSIS — Z1231 Encounter for screening mammogram for malignant neoplasm of breast: Secondary | ICD-10-CM

## 2016-06-09 ENCOUNTER — Encounter (HOSPITAL_COMMUNITY): Payer: Self-pay | Admitting: Oncology

## 2016-06-09 ENCOUNTER — Encounter (HOSPITAL_COMMUNITY): Payer: 59

## 2016-06-09 ENCOUNTER — Encounter (HOSPITAL_COMMUNITY): Payer: 59 | Attending: Oncology | Admitting: Oncology

## 2016-06-09 VITALS — BP 151/80 | HR 66 | Temp 98.4°F | Resp 18 | Wt 135.7 lb

## 2016-06-09 DIAGNOSIS — C50912 Malignant neoplasm of unspecified site of left female breast: Secondary | ICD-10-CM | POA: Diagnosis not present

## 2016-06-09 DIAGNOSIS — Z170 Estrogen receptor positive status [ER+]: Secondary | ICD-10-CM | POA: Diagnosis not present

## 2016-06-09 DIAGNOSIS — C773 Secondary and unspecified malignant neoplasm of axilla and upper limb lymph nodes: Secondary | ICD-10-CM | POA: Insufficient documentation

## 2016-06-09 LAB — COMPREHENSIVE METABOLIC PANEL
ALT: 26 U/L (ref 14–54)
ANION GAP: 6 (ref 5–15)
AST: 22 U/L (ref 15–41)
Albumin: 4.4 g/dL (ref 3.5–5.0)
Alkaline Phosphatase: 83 U/L (ref 38–126)
BILIRUBIN TOTAL: 0.6 mg/dL (ref 0.3–1.2)
BUN: 13 mg/dL (ref 6–20)
CO2: 30 mmol/L (ref 22–32)
Calcium: 9.3 mg/dL (ref 8.9–10.3)
Chloride: 104 mmol/L (ref 101–111)
Creatinine, Ser: 0.73 mg/dL (ref 0.44–1.00)
GFR calc Af Amer: >60 mL/min (ref >60)
Glucose, Bld: 99 mg/dL (ref 65–99)
POTASSIUM: 3.2 mmol/L — AB (ref 3.5–5.1)
Sodium: 140 mmol/L (ref 135–145)
TOTAL PROTEIN: 7.3 g/dL (ref 6.5–8.1)

## 2016-06-09 LAB — CBC WITH DIFFERENTIAL/PLATELET
Basophils Absolute: 0.1 10*3/uL (ref 0.0–0.1)
Basophils Relative: 1 %
Eosinophils Absolute: 0.8 10*3/uL — ABNORMAL HIGH (ref 0.0–0.7)
Eosinophils Relative: 17 %
HEMATOCRIT: 38.7 % (ref 36.0–46.0)
Hemoglobin: 12.8 g/dL (ref 12.0–15.0)
LYMPHS PCT: 29 %
Lymphs Abs: 1.4 10*3/uL (ref 0.7–4.0)
MCH: 33.6 pg (ref 26.0–34.0)
MCHC: 33.1 g/dL (ref 30.0–36.0)
MCV: 101.6 fL — AB (ref 78.0–100.0)
MONO ABS: 0.4 10*3/uL (ref 0.1–1.0)
MONOS PCT: 8 %
NEUTROS ABS: 2.2 10*3/uL (ref 1.7–7.7)
Neutrophils Relative %: 45 %
Platelets: 170 10*3/uL (ref 150–400)
RBC: 3.81 MIL/uL — ABNORMAL LOW (ref 3.87–5.11)
RDW: 12.3 % (ref 11.5–15.5)
WBC: 4.9 10*3/uL (ref 4.0–10.5)

## 2016-06-09 MED ORDER — EXEMESTANE 25 MG PO TABS: 25.0000 mg | ORAL_TABLET | Freq: Every day | ORAL | 6 refills | Status: DC

## 2016-06-09 NOTE — Addendum Note (Signed)
Addended by: Farley Ly on: 06/09/2016 12:57 PM   Modules accepted: Orders

## 2016-06-09 NOTE — Patient Instructions (Signed)
[place] at [facility] Discharge Instructions  RECOMMENDATIONS MADE BY THE CONSULTANT AND ANY TEST RESULTS WILL BE SENT TO YOUR REFERRING PHYSICIAN.  You were seen today by Dr. Oliva Bustard. Get colonoscopy done. Follow up in 6 months with labs and appointment.   Thank you for choosing [place] at [facility] to provide your oncology and hematology care.  To afford each patient quality time with our provider, please arrive at least 15 minutes before your scheduled appointment time.   Beginning January 23rd 2017 lab work for the [facility] will be done in the  Main lab at [facility] on 1st floor. If you have a lab appointment with the [facility] please come in thru the  Main Entrance and check in at the main information desk  You need to re-schedule your appointment should you arrive 10 or more minutes late.  We strive to give you quality time with our providers, and arriving late affects you and other patients whose appointments are after yours.  Also, if you no show three or more times for appointments you may be dismissed from the clinic at the providers discretion.     Again, thank you for choosing [facility].  Our hope is that these requests will decrease the amount of time that you wait before being seen by our physicians.       _____________________________________________________________  Should you have questions after your visit to [facility] please contact our office at [phone] between the hours of 8:30 a.m. and 4:30 p.m.  Voicemails left after 4:30 p.m. will not be returned until the following business day.  For prescription refill requests, have your pharmacy contact our office.         Resources For Cancer Patients and their Caregivers ? American Cancer Society: Can assist with transportation, wigs, general needs, runs Look Good Feel Better.        9045016599 ? Cancer  Care: Provides financial assistance, online support groups, medication/co-pay assistance.  1-800-813-HOPE 4425207822) ? [facility] Assists [place] cancer patients and their families through emotional , educational and financial support.  337-400-1572 ? [place] DSS Where to apply for food stamps, Medicaid and utility assistance. 671-521-1798 ? [facility]: Transportation to medical appointments. 437-136-5819 ? Social Security Administration: May apply for disability if have a Stage IV cancer. 830-076-1126 425 358 8676 ? [employer], Disability and Transit Services: Assists with nutrition, care and transit needs. [facility] Support Programs: @10RELATIVEDAYS @ > Cancer Support Group  2nd Tuesday of the month 1pm-2pm, Journey Room  > Creative Journey  3rd Tuesday of the month 1130am-1pm, Journey Room  > Look Good Feel Better  1st Wednesday of the month 10am-12 noon, Journey Room (Call [facility] to register [phone])

## 2016-06-09 NOTE — Progress Notes (Signed)
Heather Vaughan, [place] [address]  No diagnosis found.  CURRENT THERAPY: Surveillance per NCCN guidelines  INTERVAL HISTORY: Heather Vaughan 60 y.o. 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 S/P left partial mastectomy on 07/03/2013 demonstrating a 1.4 cm invasive component that was ER 74%, PR 16%, Ki-67 52%, and HER2 negative. However, positive margins were noted and therefore on 07/17/2013 she underwent a left modified radical mastectomy and left axillary lymph node dissection. Clear margins were ascertained with 6/38 positive lymph nodes positive for metastatic disease. Testing on the positive lymph node reveals HER2 positivity. OncoType DX score was 29 placing her in the high-end of the intermediate risk group for recurrence. She was therefore started on [facility] therapy on 08/27/2013 and finished TC on 12/18/2013 after 6 cycles. She is S/P radiation therapy by Dr. Pablo Vaughan on 01/27/2014- 03/14/2014. She is S/P completion of 52 weeks worth of Herceptin therapy. Now on Aromasin (beginning on on 03/29/2014).  Patient is here for ongoing evaluation and consideration of follow-up regarding breast    Invasive ductal carcinoma metastasized to axillary lymph node, left   05/28/2013 Imaging    Screening mammogram      06/19/2013 Imaging    Left diagnostic mammogram      06/25/2013 Imaging    CT CAP- borderline enlarged axillary lymph node on left.  Small pulmonary nodules noted but too small for PET or biopsy; surveillance recommended.       07/03/2013 Surgery    Left partial mastectomy.  Invasive ductal carcinoma. 1.4 cm.  POSITIVE MARGINS.  Additional extensive DCIS noted.  ER 74%, PR 16%, Ki-67 52%, HER2 NEGATIVE.      07/17/2013 Surgery    Left modified radiacl mastectomy with left axillary node dissection.  Negative residual cancer.  Negative inked margins.  1/12 positive lymph nodes.  FINAL  MARGINS NEGATIVE.  LYMPH NODE IS HER2 POSITIVE.      08/13/2013 Imaging    MUGA scan shows left ventricular EF of 62%      08/27/2013 - 12/18/2013 Chemotherapy    TCH x 6 cycles      01/07/2014 -  Chemotherapy    Herceptin x 52 weeks      01/27/2014 - 03/13/2014 Radiation Therapy    By Dr. Pablo Vaughan      03/29/2014 -  Anti-estrogen oral therapy    Aromasin      10/24/2014 Imaging    Bone density- BMD as determined from Femur Neck Right is 0.698 g/cm2 with a T-Score of -2.4. This patient is considered osteopenic according to [facility]an [facility] [place]) criteria. (Lumbar spine was not utilized due to advanced degenerative changes).      11/05/2014 Imaging    MUGA- Left ventricular ejection fraction equals 70%. No change from prior.      04/27/2015 Procedure    Port-A-Cath removal by Heather Vaughan      06/19/2015 Imaging    CT chest- Status post left mastectomy, without recurrent or metastatic disease. Decrease in size of right-sided pulmonary nodules, favoring an infectious or inflammatory etiology.            Review of Systems  Constitutional: Negative.   HENT: Negative.   Eyes: Negative.  Negative for blurred vision and double vision.  Respiratory: Negative.  Negative for cough, sputum production and shortness of breath.   Cardiovascular: Negative.  Negative for chest pain.  Gastrointestinal: Negative.  Negative for abdominal  pain, blood in stool, constipation, diarrhea, melena, nausea and vomiting.  Genitourinary: Negative.  Negative for dysuria, frequency, hematuria and urgency.  Musculoskeletal: Negative.  Negative for falls, joint pain and myalgias.       Continues with ankle/foot pain  Skin: Negative.   Neurological: Negative.  Negative for sensory change, speech change, focal weakness, seizures, loss of consciousness and headaches.  Endo/Heme/Allergies: Negative.   Psychiatric/Behavioral: Negative.     Past Medical History:  Diagnosis Date  . Anxiety   . B12  deficiency 01/27/2016  . Barrett's esophagus   . Breast cancer ([place])   . Breast cancer, left breast ([place])   . Bronchitis 07/27/2011  . GERD (gastroesophageal reflux disease)   . Hypertension   . Osteopenia 10/09/2014    Past Surgical History:  Procedure Laterality Date  . BREAST BIOPSY Bilateral    X 4- benign  . ESOPHAGOGASTRODUODENOSCOPY  08/03/2011   Procedure: ESOPHAGOGASTRODUODENOSCOPY (EGD);  Surgeon: Heather Houston, MD;  Location: [facility];  Service: Endoscopy;  Laterality: N/A;  12:45  . EXCISION MORTON'S NEUROMA  07/14/2011   Procedure: EXCISION MORTON'S NEUROMA;  Surgeon: Heather Vaughan;  Location: [facility] ORS;  Service: Orthopedics;  Laterality: Right;  Removal Neuroma 3rd Interspace Right Foot  . MASTECTOMY MODIFIED RADICAL Left 07/17/2013   Procedure: MASTECTOMY MODIFIED RADICAL WITH LEFT AXILLARY TISSUE REMOVAL;  Surgeon: Heather Ran, MD;  Location: [facility] ORS;  Service: General;  Laterality: Left;  . PARTIAL MASTECTOMY WITH NEEDLE LOCALIZATION Left 07/03/2013   Procedure: PARTIAL MASTECTOMY STATUS POST NEEDLE LOCALIZATION;  Surgeon: Heather Ran, MD;  Location: [facility] ORS;  Service: General;  Laterality: Left;  . PORT-A-CATH REMOVAL Right 04/27/2015   Procedure: REMOVAL PORT-A-CATH;  Surgeon: Heather Signs, MD;  Location: [facility] ORS;  Service: General;  Laterality: Right;  . PORTACATH PLACEMENT Right 08/16/2013   Procedure: INSERTION PORT-A-CATH RIGHT SUBCLAVIAN;  Surgeon: Heather Ran, MD;  Location: [facility] ORS;  Service: General;  Laterality: Right;    Family History  Problem Relation Age of Onset  . Anesthesia problems Neg Hx   . Hypotension Neg Hx   . Malignant hyperthermia Neg Hx   . Pseudochol deficiency Neg Hx     Social History   Social History  . Marital status: Married    Spouse name: N/A  . Number of children: N/A  . Years of education: N/A   Social History Main Topics  . Smoking status: Former Smoker    Packs/day: 1.00    Years: 20.00     Types: Cigarettes    Quit date: 09/05/1993  . Smokeless tobacco: Never Used  . Alcohol use No     Comment: occasionally  . Drug use: No  . Sexual activity: Not on file   Other Topics Concern  . Not on file   Social History Narrative  . No narrative on file     PHYSICAL EXAMINATION  ECOG PERFORMANCE STATUS: 0 - Asymptomatic  Vitals:   06/09/16 1141  BP: (!) 151/80  Pulse: 66  Resp: 18  Temp: 98.4 F (36.9 C)    GENERAL:alert, no distress, well nourished, well developed, comfortable, cooperative, smiling and unaccompanied SKIN: skin color, texture, turgor are normal, no rashes or significant lesions HEAD: Normocephalic, No masses, lesions, tenderness or abnormalities EYES: normal, EOMI, Conjunctiva are pink and non-injected EARS: External ears normal OROPHARYNX:lips, buccal mucosa, and tongue normal, dentition normal and mucous membranes are moist  NECK: supple, no adenopathy, thyroid normal size, non-tender, without nodularity, no stridor, non-tender,  trachea midline LYMPH:  no palpable lymphadenopathy, no hepatosplenomegaly BREAST:right breast normal without mass, skin or nipple changes or axillary nodes, left post-mastectomy site well healed and free of suspicious changes LUNGS: clear to auscultation and percussion HEART: regular rate & rhythm, no murmurs, no gallops, S1 normal and S2 normal ABDOMEN:abdomen soft, non-tender, normal bowel sounds and no masses or organomegaly BACK: Back symmetric, no curvature. EXTREMITIES:less then 2 second capillary refill, no joint deformities, effusion, or inflammation, no skin discoloration, no clubbing, no cyanosis  NEURO: alert & oriented x 3 with fluent speech, no focal motor/sensory deficits, gait normal   LABORATORY DATA: CBC    Component Value Date/Time   WBC 4.9 06/09/2016 1024   RBC 3.81 (L) 06/09/2016 1024   HGB 12.8 06/09/2016 1024   HCT 38.7 06/09/2016 1024   PLT 170 06/09/2016 1024   MCV 101.6 (H) 06/09/2016 1024     MCH 33.6 06/09/2016 1024   MCHC 33.1 06/09/2016 1024   RDW 12.3 06/09/2016 1024   LYMPHSABS 1.4 06/09/2016 1024   MONOABS 0.4 06/09/2016 1024   EOSABS 0.8 (H) 06/09/2016 1024   BASOSABS 0.1 06/09/2016 1024      Chemistry      Component Value Date/Time   NA 140 06/09/2016 1024   K 3.2 (L) 06/09/2016 1024   CL 104 06/09/2016 1024   CO2 30 06/09/2016 1024   BUN 13 06/09/2016 1024   CREATININE 0.73 06/09/2016 1024      Component Value Date/Time   CALCIUM 9.3 06/09/2016 1024   ALKPHOS 83 06/09/2016 1024   AST 22 06/09/2016 1024   ALT 26 06/09/2016 1024   BILITOT 0.6 06/09/2016 1024        PENDING LABS:  All lab data has been reviewed except for slightly low potassium and they are within normal limit RADIOGRAPHIC STUDIES:  Mammogram has been scheduled next month.  By primary care physician.  Bone density study also would be scheduled      ASSESSMENT AND PLAN:  Carcinoma of breast status post mastectomy.  Estrogen and progesterone receptor positive.  HER-2 receptor positive. She is taking Aromasin and tolerating well. Taking Fosamax along with calcium and vitamin D Continue follow-up with repeat mammogram.  On clinical criteria there is no evidence of recurrent disease.   ORDERS PLACED FOR THIS ENCOUNTER: No orders of the defined types were placed in this encounter.   MEDICATIONS PRESCRIBED THIS ENCOUNTER: Continue Aromasin and Fosamax Evaluate patient in 6 months THERAPY PLAN:  NCCN guidelines recommends the following surveillance for invasive breast cancer (2.2017):  A. History and Physical exam 1-4 times per year as clinically appropriate for 5 years, then annually.  B. Periodic screening for changes in family history and referral to genetics counseling as indicated  C. Educate, monitor, and refer to lymphedema management.  D. Mammography every 12 months  E. Routine imaging of reconstructed breast is not indicated.  F. In the absence of clinical Vaughan and  symptoms suggestive of recurrent disease, there is no indication for laboratory or imaging studies for metastases screening.  G. Women on Tamoxifen: annual gynecologic assessment every 12 months if uterus is present.  H. Women on aromatase inhibitor or who experience ovarian failure secondary to treatment should have monitoring of bone health with a bone mineral density determination at baseline and periodically thereafter.  I. Assess and encourage adherence to adjuvant endocrine therapy.  J. Evidence suggests that active lifestyle, healthy diet, limited alcohol intake, and achieving and maintaining an ideal body weight (20-25  BMI) may lead to optimal breast cancer outcomes.   All questions were answered. The patient knows to call the clinic with any problems, questions or concerns. We can certainly see the patient much sooner if necessary.   This note is electronically signed by: Forest Gleason, MD 06/09/2016 11:47 AM

## 2016-06-20 ENCOUNTER — Ambulatory Visit (HOSPITAL_COMMUNITY)
Admission: RE | Admit: 2016-06-20 | Discharge: 2016-06-20 | Disposition: A | Discharge status: Home / Self Care | Payer: 59 | Source: Ambulatory Visit | Attending: Oncology | Admitting: Oncology

## 2016-06-20 ENCOUNTER — Other Ambulatory Visit (HOSPITAL_COMMUNITY): Payer: Self-pay | Admitting: Oncology

## 2016-06-20 DIAGNOSIS — R918 Other nonspecific abnormal finding of lung field: Secondary | ICD-10-CM | POA: Diagnosis not present

## 2016-06-20 DIAGNOSIS — C50912 Malignant neoplasm of unspecified site of left female breast: Secondary | ICD-10-CM | POA: Insufficient documentation

## 2016-06-20 DIAGNOSIS — J439 Emphysema, unspecified: Secondary | ICD-10-CM | POA: Diagnosis not present

## 2016-06-20 DIAGNOSIS — C773 Secondary and unspecified malignant neoplasm of axilla and upper limb lymph nodes: Principal | ICD-10-CM

## 2016-06-20 DIAGNOSIS — R911 Solitary pulmonary nodule: Secondary | ICD-10-CM

## 2016-07-14 ENCOUNTER — Ambulatory Visit (HOSPITAL_COMMUNITY)
Admission: RE | Admit: 2016-07-14 | Discharge: 2016-07-14 | Disposition: A | Discharge status: Home / Self Care | Payer: 59 | Source: Ambulatory Visit | Attending: Oncology | Admitting: Oncology

## 2016-07-14 DIAGNOSIS — Z1231 Encounter for screening mammogram for malignant neoplasm of breast: Secondary | ICD-10-CM | POA: Diagnosis not present

## 2016-09-14 ENCOUNTER — Encounter (HOSPITAL_COMMUNITY): Payer: Self-pay

## 2016-09-14 ENCOUNTER — Ambulatory Visit (HOSPITAL_COMMUNITY)
Admission: RE | Admit: 2016-09-14 | Discharge: 2016-09-14 | Disposition: A | Discharge status: Home / Self Care | Payer: 59 | Source: Ambulatory Visit | Attending: Oncology | Admitting: Oncology

## 2016-09-14 DIAGNOSIS — C773 Secondary and unspecified malignant neoplasm of axilla and upper limb lymph nodes: Secondary | ICD-10-CM | POA: Insufficient documentation

## 2016-09-14 DIAGNOSIS — R590 Localized enlarged lymph nodes: Secondary | ICD-10-CM | POA: Diagnosis not present

## 2016-09-14 DIAGNOSIS — K449 Diaphragmatic hernia without obstruction or gangrene: Secondary | ICD-10-CM | POA: Insufficient documentation

## 2016-09-14 DIAGNOSIS — I7090 Unspecified atherosclerosis: Secondary | ICD-10-CM | POA: Diagnosis not present

## 2016-09-14 DIAGNOSIS — C50912 Malignant neoplasm of unspecified site of left female breast: Secondary | ICD-10-CM | POA: Diagnosis present

## 2016-09-14 DIAGNOSIS — R918 Other nonspecific abnormal finding of lung field: Secondary | ICD-10-CM | POA: Diagnosis not present

## 2016-09-14 LAB — POCT I-STAT CREATININE: Creatinine, Ser: 1 mg/dL (ref 0.44–1.00)

## 2016-09-14 MED ORDER — IOPAMIDOL (ISOVUE-300) INJECTION 61%
75.0000 mL | Freq: Once | INTRAVENOUS | Status: AC | PRN
  Administered 2016-09-14: 75 mL via INTRAVENOUS

## 2016-12-08 ENCOUNTER — Encounter (HOSPITAL_COMMUNITY): Payer: 59 | Attending: Oncology | Admitting: Oncology

## 2016-12-08 ENCOUNTER — Encounter (HOSPITAL_COMMUNITY): Payer: Self-pay

## 2016-12-08 ENCOUNTER — Encounter (HOSPITAL_COMMUNITY): Payer: 59

## 2016-12-08 VITALS — BP 129/73 | HR 64 | Temp 98.4°F | Resp 16 | Wt 139.0 lb

## 2016-12-08 DIAGNOSIS — C50912 Malignant neoplasm of unspecified site of left female breast: Secondary | ICD-10-CM | POA: Diagnosis not present

## 2016-12-08 DIAGNOSIS — C773 Secondary and unspecified malignant neoplasm of axilla and upper limb lymph nodes: Secondary | ICD-10-CM | POA: Diagnosis not present

## 2016-12-08 DIAGNOSIS — M8580 Other specified disorders of bone density and structure, unspecified site: Secondary | ICD-10-CM | POA: Diagnosis not present

## 2016-12-08 LAB — COMPREHENSIVE METABOLIC PANEL
ALBUMIN: 4.1 g/dL (ref 3.5–5.0)
ALT: 14 U/L (ref 14–54)
ANION GAP: 9 (ref 5–15)
AST: 18 U/L (ref 15–41)
Alkaline Phosphatase: 91 U/L (ref 38–126)
BUN: 11 mg/dL (ref 6–20)
CALCIUM: 9.2 mg/dL (ref 8.9–10.3)
CO2: 30 mmol/L (ref 22–32)
Chloride: 100 mmol/L — ABNORMAL LOW (ref 101–111)
Creatinine, Ser: 0.79 mg/dL (ref 0.44–1.00)
GFR calc non Af Amer: >60 mL/min (ref >60)
Glucose, Bld: 99 mg/dL (ref 65–99)
Potassium: 3.5 mmol/L (ref 3.5–5.1)
Sodium: 139 mmol/L (ref 135–145)
Total Bilirubin: 0.5 mg/dL (ref 0.3–1.2)
Total Protein: 6.9 g/dL (ref 6.5–8.1)

## 2016-12-08 LAB — CBC WITH DIFFERENTIAL/PLATELET
BASOS ABS: 0 10*3/uL (ref 0.0–0.1)
Basophils Relative: 0 %
Eosinophils Absolute: 0 10*3/uL (ref 0.0–0.7)
Eosinophils Relative: 1 %
HEMATOCRIT: 37.2 % (ref 36.0–46.0)
HEMOGLOBIN: 12.4 g/dL (ref 12.0–15.0)
LYMPHS PCT: 35 %
Lymphs Abs: 1.6 10*3/uL (ref 0.7–4.0)
MCH: 33.9 pg (ref 26.0–34.0)
MCHC: 33.3 g/dL (ref 30.0–36.0)
MCV: 101.6 fL — AB (ref 78.0–100.0)
Monocytes Absolute: 0.4 10*3/uL (ref 0.1–1.0)
Monocytes Relative: 8 %
NEUTROS ABS: 2.6 10*3/uL (ref 1.7–7.7)
Neutrophils Relative %: 56 %
PLATELETS: 177 10*3/uL (ref 150–400)
RBC: 3.66 MIL/uL — AB (ref 3.87–5.11)
RDW: 12.3 % (ref 11.5–15.5)
WBC: 4.7 10*3/uL (ref 4.0–10.5)

## 2016-12-08 MED ORDER — CALCIUM CARBONATE-VITAMIN D 500-200 MG-UNIT PO TABS: 2.0000 | ORAL_TABLET | Freq: Every day | ORAL | 6 refills | Status: DC

## 2016-12-08 NOTE — Progress Notes (Signed)
Herbst, PA-C [address] [place] [address]  Carcinoma of left breast metastatic to axillary lymph node ([place]) - Plan: CBC with Differential, Comprehensive metabolic panel  Osteopenia, unspecified location - Plan: DG Bone Density, CBC with Differential, Comprehensive metabolic panel  Progress Note  CURRENT THERAPY: Surveillance per NCCN guidelines  INTERVAL HISTORY: Heather Vaughan 61 y.o. 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 S/P left partial mastectomy on 07/03/2013 demonstrating a 1.4 cm invasive component that was ER 74%, PR 16%, Ki-67 52%, and HER2 negative. However, positive margins were noted and therefore on 07/17/2013 she underwent a left modified radical mastectomy and left axillary lymph node dissection. Clear margins were ascertained with 9/16 positive lymph nodes positive for metastatic disease. Testing on the positive lymph node reveals HER2 positivity. OncoType DX score was 29 placing her in the high-end of the intermediate risk group for recurrence. She was therefore started on [facility] therapy on 08/27/2013 and finished TC on 12/18/2013 after 6 cycles. She is S/P radiation therapy by Dr. Pablo Ledger on 01/27/2014- 03/14/2014. She is S/P completion of 52 weeks worth of Herceptin therapy. Now on Aromasin (beginning on on 03/29/2014).     Invasive ductal carcinoma metastasized to axillary lymph node, left   05/28/2013 Imaging    Screening mammogram      06/19/2013 Imaging    Left diagnostic mammogram      06/25/2013 Imaging    CT CAP- borderline enlarged axillary lymph node on left.  Small pulmonary nodules noted but too small for PET or biopsy; surveillance recommended.       07/03/2013 Surgery    Left partial mastectomy.  Invasive ductal carcinoma. 1.4 cm.  POSITIVE MARGINS.  Additional extensive DCIS noted.  ER 74%, PR 16%, Ki-67 52%, HER2 NEGATIVE.      07/17/2013 Surgery    Left modified  radiacl mastectomy with left axillary node dissection.  Negative residual cancer.  Negative inked margins.  1/12 positive lymph nodes.  FINAL MARGINS NEGATIVE.  LYMPH NODE IS HER2 POSITIVE.      08/13/2013 Imaging    MUGA scan shows left ventricular EF of 62%      08/27/2013 - 12/18/2013 Chemotherapy    TCH x 6 cycles      01/07/2014 -  Chemotherapy    Herceptin x 52 weeks      01/27/2014 - 03/13/2014 Radiation Therapy    By Dr. Pablo Ledger      03/29/2014 -  Anti-estrogen oral therapy    Aromasin      10/24/2014 Imaging    Bone density- BMD as determined from Femur Neck Right is 0.698 g/cm2 with a T-Score of -2.4. This patient is considered osteopenic according to [facility]) criteria. (Lumbar spine was not utilized due to advanced degenerative changes).      11/05/2014 Imaging    MUGA- Left ventricular ejection fraction equals 70%. No change from prior.      04/27/2015 Procedure    Port-A-Cath removal by Dr. Arnoldo Morale      06/19/2015 Imaging    CT chest- Status post left mastectomy, without recurrent or metastatic disease. Decrease in size of right-sided pulmonary nodules, favoring an infectious or inflammatory etiology.      Heather Vaughan presents to the clinic for continuing  follow up.   Patient has no complaints. She notes she has been doing well with her Aromasin and has not had any related side effects or  symptoms. She states she is not currently taking calcium and vitamin D with her Aromasin. She did not know that she needed to be taking them with her Aromasin.   She had her last mammogram in November 2018 which was birads 1. She notes she has not felt any new lumps or bumps. She does note that she sometimes feels sore at the scar site with movement such as lifting.   Denies, hot flashes, nausea,  vomitting,  joint pain, chest pain, fatigue, sob, and abdominal pian.    Review of Systems  Constitutional: Negative.  Negative for malaise/fatigue.        Denies hot flashes   HENT: Negative.   Eyes: Negative.   Respiratory: Negative.  Negative for shortness of breath.   Cardiovascular: Negative.  Negative for chest pain.  Gastrointestinal: Negative.  Negative for abdominal pain, nausea and vomiting.  Genitourinary: Negative.   Musculoskeletal: Negative.  Negative for joint pain.  Skin: Negative.   Neurological: Negative.   Endo/Heme/Allergies: Negative.   Psychiatric/Behavioral: Negative.   All other systems reviewed and are negative.   Past Medical History:  Diagnosis Date  . Anxiety   . B12 deficiency 01/27/2016  . Barrett's esophagus   . Breast cancer ([place])   . Breast cancer, left breast ([place])   . Bronchitis 07/27/2011  . GERD (gastroesophageal reflux disease)   . Hypertension   . Osteopenia 10/09/2014    Past Surgical History:  Procedure Laterality Date  . BREAST BIOPSY Bilateral    X 4- benign  . ESOPHAGOGASTRODUODENOSCOPY  08/03/2011   Procedure: ESOPHAGOGASTRODUODENOSCOPY (EGD);  Surgeon: Rogene Houston, MD;  Location: [facility];  Service: Endoscopy;  Laterality: N/A;  12:45  . EXCISION MORTON'S NEUROMA  07/14/2011   Procedure: EXCISION MORTON'S NEUROMA;  Surgeon: Marcheta Grammes;  Location: [facility] ORS;  Service: Orthopedics;  Laterality: Right;  Removal Neuroma 3rd Interspace Right Foot  . MASTECTOMY MODIFIED RADICAL Left 07/17/2013   Procedure: MASTECTOMY MODIFIED RADICAL WITH LEFT AXILLARY TISSUE REMOVAL;  Surgeon: Scherry Ran, MD;  Location: [facility] ORS;  Service: General;  Laterality: Left;  . PARTIAL MASTECTOMY WITH NEEDLE LOCALIZATION Left 07/03/2013   Procedure: PARTIAL MASTECTOMY STATUS POST NEEDLE LOCALIZATION;  Surgeon: Scherry Ran, MD;  Location: [facility] ORS;  Service: General;  Laterality: Left;  . PORT-A-CATH REMOVAL Right 04/27/2015   Procedure: REMOVAL PORT-A-CATH;  Surgeon: Aviva Signs, MD;  Location: [facility] ORS;  Service: General;  Laterality: Right;  . PORTACATH PLACEMENT Right 08/16/2013    Procedure: INSERTION PORT-A-CATH RIGHT SUBCLAVIAN;  Surgeon: Scherry Ran, MD;  Location: [facility] OR[facility];  Service: General;  Laterality: Right;    Family History  Problem Relation Age of Onset  . Anesthesia problems Neg Hx   . Hypotension Neg Hx   . Malignant hyperthermia Neg Hx   . Pseudochol deficiency Neg Hx     Social History   Social History  . Marital status: Married    Spouse name: N/A  . Number of children: N/A  . Years of education: N/A   Social History Main Topics  . Smoking status: Former Smoker    Packs/day: 1.00    Years: 20.00    Types: Cigarettes    Quit date: 09/05/1993  . Smokeless tobacco: Never Used  . Alcohol use No     Comment: occasionally  . Drug use: No  . Sexual activity: Not Asked   Other Topics Concern  . None   Social History Narrative  . None  PHYSICAL EXAMINATION  ECOG PERFORMANCE STATUS: 0 - Asymptomatic  Vitals:   12/08/16 1105  BP: 129/73  Pulse: 64  Resp: 16  Temp: 98.4 F (36.9 C)   Filed Weights   12/08/16 1105  Weight: 139 lb (63 kg)      Physical Exam  Constitutional: She is oriented to person, place, and time and well-developed, well-nourished, and in no distress.  HENT:  Head: Normocephalic and atraumatic.  Eyes: Conjunctivae and EOM are normal. Pupils are equal, round, and reactive to light.  Neck: Normal range of motion. Neck supple.  Cardiovascular: Normal rate, regular rhythm and normal heart sounds.   Pulmonary/Chest: Effort normal and breath sounds normal. Right breast exhibits no inverted nipple, no mass, no nipple discharge, no skin change and no tenderness.    Abdominal: Soft. Bowel sounds are normal.  Musculoskeletal: Normal range of motion.  Neurological: She is alert and oriented to person, place, and time. Gait normal.  Skin: Skin is warm and dry.  Nursing note and vitals reviewed.   LABORATORY DATA: CBC    Component Value Date/Time   WBC 4.7 12/08/2016 1034   RBC 3.66 (L) 12/08/2016  1034   HGB 12.4 12/08/2016 1034   HCT 37.2 12/08/2016 1034   PLT 177 12/08/2016 1034   MCV 101.6 (H) 12/08/2016 1034   MCH 33.9 12/08/2016 1034   MCHC 33.3 12/08/2016 1034   RDW 12.3 12/08/2016 1034   LYMPHSABS 1.6 12/08/2016 1034   MONOABS 0.4 12/08/2016 1034   EOSABS 0.0 12/08/2016 1034   BASOSABS 0.0 12/08/2016 1034      Chemistry      Component Value Date/Time   NA 139 12/08/2016 1034   K 3.5 12/08/2016 1034   CL 100 (L) 12/08/2016 1034   CO2 30 12/08/2016 1034   BUN 11 12/08/2016 1034   CREATININE 0.79 12/08/2016 1034      Component Value Date/Time   CALCIUM 9.2 12/08/2016 1034   ALKPHOS 91 12/08/2016 1034   AST 18 12/08/2016 1034   ALT 14 12/08/2016 1034   BILITOT 0.5 12/08/2016 1034        PENDING LABS:   RADIOGRAPHIC STUDIES: I have personally reviewed the radiological images as listed and agreed with the findings in the report. No results found.  CT CHEST WITH CONTRAST 09/14/2016  IMPRESSION: 1. The various small pulmonary nodules appear stable back through 06/25/2013 and accordingly are unlikely to represent active malignancy. 2. Atherosclerosis. 3. Stable mildly enlarged right lower paratracheal lymph node at 1.2 cm the. 4. Small to moderate hiatal hernia.  PATHOLOGY:    ASSESSMENT AND PLAN:  Invasive ductal carcinoma of left breast with high grade DCIS. S/P left partial mastectomy on 07/03/2013 demonstrating a 1.4 cm invasive component that was ER 74%, PR 16%, Ki-67 52%, and HER2 negative. However, positive margins were noted and therefore on 07/17/2013 she underwent a left modified radical mastectomy and left axillary lymph node dissection. Clear margins were ascertained with 4/23 positive lymph nodes positive for metastatic disease. Testing on the positive lymph node reveals HER2 positivity. OncoType DX score was 29 placing her in the high-end of the intermediate risk group for recurrence. She was therefore started on [facility] therapy on 08/27/2013 and  finished TC on 12/18/2013 after 6 cycles. She is S/P radiation therapy by Dr. Pablo Ledger on 01/27/2014- 03/14/2014. She is S/P completion of 52 weeks worth of Herceptin therapy.  Now on Aromasin (beginning on on 03/29/2014).    PLAN: Breast exam was performed today during physical  exam; negative exam.   She states she is not currently taking calcium and vitamin D with her Aromasin. She was previously on prolia for her osteopenia (10/09/2014-1//30/2017) however had to stop due to insurance costs. She was then placed on fosamax however I do not believe patient took this medication.  I counseled the patient on taking calcium and vitamin D.  I prescribed her a calcium + vitamin D tablet to take orally BID.   I ordered a bone density scan since it has been 2 years since her last one. If her osteopenia has worsened with attempt fosamax.   Repeat mammogram in November 2018.   RTC in 6 months for follow up with CBC and CMP.   ORDERS PLACED FOR THIS ENCOUNTER: Orders Placed This Encounter  Procedures  . DG Bone Density  . CBC with Differential  . Comprehensive metabolic panel    MEDICATIONS PRESCRIBED THIS ENCOUNTER: Meds ordered this encounter  Medications  . calcium-vitamin D (OSCAL WITH D) 500-200 MG-UNIT tablet    Sig: Take 2 tablets by mouth daily with breakfast.    Dispense:  60 tablet    Refill:  6    THERAPY PLAN:  NCCN guidelines recommends the following surveillance for invasive breast cancer (2.2017):  A. History and Physical exam 1-4 times per year as clinically appropriate for 5 years, then annually.  B. Periodic screening for changes in family history and referral to genetics counseling as indicated  C. Educate, monitor, and refer to lymphedema management.  D. Mammography every 12 months  E. Routine imaging of reconstructed breast is not indicated.  F. In the absence of clinical signs and symptoms suggestive of recurrent disease, there is no indication for laboratory or imaging  studies for metastases screening.  G. Women on Tamoxifen: annual gynecologic assessment every 12 months if uterus is present.  H. Women on aromatase inhibitor or who experience ovarian failure secondary to treatment should have monitoring of bone health with a bone mineral density determination at baseline and periodically thereafter.  I. Assess and encourage adherence to adjuvant endocrine therapy.  J. Evidence suggests that active lifestyle, healthy diet, limited alcohol intake, and achieving and maintaining an ideal body weight (20-25 BMI) may lead to optimal breast cancer outcomes.    All questions were answered. The patient knows to call the clinic with any problems, questions or concerns. We can certainly see the patient much sooner if necessary.  This document serves as a record of services personally performed by Twana First, MD. It was created on her behalf by Shirlean Mylar, a trained medical scribe. The creation of this record is based on the scribe's personal observations and the provider's statements to them. This document has been checked and approved by the attending provider.  I have reviewed the above documentation for accuracy and completeness and I agree with the above.  This note is electronically signed by: Mikey College 12/08/2016 11:21 AM

## 2016-12-08 NOTE — Patient Instructions (Addendum)
[place] at [facility] Discharge Instructions  RECOMMENDATIONS MADE BY THE CONSULTANT AND ANY TEST RESULTS WILL BE SENT TO YOUR REFERRING PHYSICIAN.  You were seen today by Dr. Twana First We will schedule you for DEXA scan We will call you in a prescription for vitamin D and calcium, be sure to take it daily Follow up in 6 months with lab work See Amy up front for appointments   Thank you for choosing [place] at [facility] to provide your oncology and hematology care.  To afford each patient quality time with our provider, please arrive at least 15 minutes before your scheduled appointment time.    If you have a lab appointment with the [facility] please come in thru the  Main Entrance and check in at the main information desk  You need to re-schedule your appointment should you arrive 10 or more minutes late.  We strive to give you quality time with our providers, and arriving late affects you and other patients whose appointments are after yours.  Also, if you no show three or more times for appointments you may be dismissed from the clinic at the providers discretion.     Again, thank you for choosing [facility].  Our hope is that these requests will decrease the amount of time that you wait before being seen by our physicians.       _____________________________________________________________  Should you have questions after your visit to [facility], please contact our office at [phone] between the hours of 8:30 a.m. and 4:30 p.m.  Voicemails left after 4:30 p.m. will not be returned until the following business day.  For prescription refill requests, have your pharmacy contact our office.       Resources For Cancer Patients and their Caregivers ? American Cancer Society: Can assist with transportation, wigs, general needs, runs Look Good Feel Better.        (314) 836-3292 ? Cancer  Care: Provides financial assistance, online support groups, medication/co-pay assistance.  1-800-813-HOPE (671) 792-8883) ? [place] Assists [place] cancer patients and their families through emotional , educational and financial support.  678-410-2047 ? [place] DSS Where to apply for food stamps, Medicaid and utility assistance. 8734153148 ? RCATS: Transportation to medical appointments. 272-218-0439 ? Social Security Administration: May apply for disability if have a Stage IV cancer. 581-232-3494 681-031-2880 ? [employer], Disability and Transit Services: Assists with nutrition, care and transit needs. [place] Support Programs: @10RELATIVEDAYS @ > Cancer Support Group  2nd Tuesday of the month 1pm-2pm, Journey Room  > Creative Journey  3rd Tuesday of the month 1130am-1pm, Journey Room  > Look Good Feel Better  1st Wednesday of the month 10am-12 noon, Journey Room (Call [place] to register [phone])

## 2016-12-15 ENCOUNTER — Other Ambulatory Visit (HOSPITAL_COMMUNITY): Payer: 59

## 2016-12-21 ENCOUNTER — Ambulatory Visit (HOSPITAL_COMMUNITY)
Admission: RE | Admit: 2016-12-21 | Discharge: 2016-12-21 | Disposition: A | Discharge status: Home / Self Care | Payer: 59 | Source: Ambulatory Visit | Attending: Oncology | Admitting: Oncology

## 2016-12-21 DIAGNOSIS — M810 Age-related osteoporosis without current pathological fracture: Secondary | ICD-10-CM | POA: Diagnosis not present

## 2016-12-21 DIAGNOSIS — M8580 Other specified disorders of bone density and structure, unspecified site: Secondary | ICD-10-CM | POA: Diagnosis present

## 2017-01-09 ENCOUNTER — Other Ambulatory Visit (HOSPITAL_COMMUNITY): Payer: Self-pay | Admitting: Oncology

## 2017-01-09 DIAGNOSIS — C773 Secondary and unspecified malignant neoplasm of axilla and upper limb lymph nodes: Principal | ICD-10-CM

## 2017-01-09 DIAGNOSIS — C50912 Malignant neoplasm of unspecified site of left female breast: Secondary | ICD-10-CM

## 2017-01-09 MED ORDER — EXEMESTANE 25 MG PO TABS: 25.0000 mg | ORAL_TABLET | Freq: Every day | ORAL | 6 refills | Status: DC

## 2017-03-13 ENCOUNTER — Telehealth (HOSPITAL_COMMUNITY): Payer: Self-pay | Admitting: Oncology

## 2017-03-13 ENCOUNTER — Other Ambulatory Visit (HOSPITAL_COMMUNITY): Payer: Self-pay | Admitting: Oncology

## 2017-03-13 DIAGNOSIS — C773 Secondary and unspecified malignant neoplasm of axilla and upper limb lymph nodes: Principal | ICD-10-CM

## 2017-03-13 DIAGNOSIS — C50912 Malignant neoplasm of unspecified site of left female breast: Secondary | ICD-10-CM

## 2017-03-13 MED ORDER — EXEMESTANE 25 MG PO TABS: 25.0000 mg | ORAL_TABLET | Freq: Every day | ORAL | 1 refills | Status: DC

## 2017-03-13 MED FILL — EXEMESTANE 25 MG TABLET: 25 | 90 days supply | Qty: 90 | Fill #0

## 2017-03-13 NOTE — Telephone Encounter (Signed)
Rcvd a call from pt stating she has lost her job and her medical ins. She is concerned about paying for her Aromasin until she enrolls/accepted in [employer]. Advised her we could get a cash price from [employer] for 30.27 90ct. Requested a script from our PA. We will purchase the first 90 day supply for the pt thru our breast Cancer fund

## 2017-06-06 MED FILL — EXEMESTANE 25 MG TABLET: 25 | 90 days supply | Qty: 90 | Fill #1

## 2017-06-09 ENCOUNTER — Encounter (HOSPITAL_COMMUNITY): Payer: BLUE CROSS/BLUE SHIELD

## 2017-06-09 ENCOUNTER — Other Ambulatory Visit: Payer: Self-pay | Admitting: Oncology

## 2017-06-09 ENCOUNTER — Encounter (HOSPITAL_COMMUNITY): Payer: Self-pay | Admitting: Hematology and Oncology

## 2017-06-09 ENCOUNTER — Encounter (HOSPITAL_COMMUNITY): Payer: BLUE CROSS/BLUE SHIELD | Attending: Hematology and Oncology | Admitting: Hematology and Oncology

## 2017-06-09 VITALS — BP 155/82 | HR 74 | Resp 16 | Ht 62.0 in | Wt 145.6 lb

## 2017-06-09 DIAGNOSIS — C50919 Malignant neoplasm of unspecified site of unspecified female breast: Secondary | ICD-10-CM | POA: Insufficient documentation

## 2017-06-09 DIAGNOSIS — C773 Secondary and unspecified malignant neoplasm of axilla and upper limb lymph nodes: Secondary | ICD-10-CM | POA: Diagnosis not present

## 2017-06-09 DIAGNOSIS — C50912 Malignant neoplasm of unspecified site of left female breast: Secondary | ICD-10-CM

## 2017-06-09 DIAGNOSIS — I10 Essential (primary) hypertension: Secondary | ICD-10-CM | POA: Insufficient documentation

## 2017-06-09 DIAGNOSIS — K2270 Barrett's esophagus without dysplasia: Secondary | ICD-10-CM | POA: Insufficient documentation

## 2017-06-09 DIAGNOSIS — Z87891 Personal history of nicotine dependence: Secondary | ICD-10-CM | POA: Diagnosis not present

## 2017-06-09 DIAGNOSIS — E538 Deficiency of other specified B group vitamins: Secondary | ICD-10-CM | POA: Insufficient documentation

## 2017-06-09 DIAGNOSIS — R0781 Pleurodynia: Secondary | ICD-10-CM

## 2017-06-09 DIAGNOSIS — K219 Gastro-esophageal reflux disease without esophagitis: Secondary | ICD-10-CM | POA: Insufficient documentation

## 2017-06-09 DIAGNOSIS — Z1231 Encounter for screening mammogram for malignant neoplasm of breast: Secondary | ICD-10-CM

## 2017-06-09 DIAGNOSIS — Z79899 Other long term (current) drug therapy: Secondary | ICD-10-CM | POA: Diagnosis not present

## 2017-06-09 DIAGNOSIS — F419 Anxiety disorder, unspecified: Secondary | ICD-10-CM | POA: Diagnosis not present

## 2017-06-09 DIAGNOSIS — Z79811 Long term (current) use of aromatase inhibitors: Secondary | ICD-10-CM

## 2017-06-09 DIAGNOSIS — M8580 Other specified disorders of bone density and structure, unspecified site: Secondary | ICD-10-CM

## 2017-06-09 LAB — COMPREHENSIVE METABOLIC PANEL
ALT: 17 U/L (ref 14–54)
AST: 22 U/L (ref 15–41)
Albumin: 4.2 g/dL (ref 3.5–5.0)
Alkaline Phosphatase: 100 U/L (ref 38–126)
Anion gap: 10 (ref 5–15)
BUN: 13 mg/dL (ref 6–20)
CHLORIDE: 102 mmol/L (ref 101–111)
CO2: 29 mmol/L (ref 22–32)
CREATININE: 0.76 mg/dL (ref 0.44–1.00)
Calcium: 9.1 mg/dL (ref 8.9–10.3)
GFR calc non Af Amer: >60 mL/min (ref >60)
Glucose, Bld: 99 mg/dL (ref 65–99)
Potassium: 3 mmol/L — ABNORMAL LOW (ref 3.5–5.1)
SODIUM: 141 mmol/L (ref 135–145)
Total Bilirubin: 0.8 mg/dL (ref 0.3–1.2)
Total Protein: 7.1 g/dL (ref 6.5–8.1)

## 2017-06-09 LAB — CBC WITH DIFFERENTIAL/PLATELET
BASOS ABS: 0 10*3/uL (ref 0.0–0.1)
Basophils Relative: 1 %
EOS ABS: 0.3 10*3/uL (ref 0.0–0.7)
EOS PCT: 7 %
HCT: 39 % (ref 36.0–46.0)
Hemoglobin: 13 g/dL (ref 12.0–15.0)
Lymphocytes Relative: 36 %
Lymphs Abs: 1.7 10*3/uL (ref 0.7–4.0)
MCH: 33.3 pg (ref 26.0–34.0)
MCHC: 33.3 g/dL (ref 30.0–36.0)
MCV: 100 fL (ref 78.0–100.0)
Monocytes Absolute: 0.2 10*3/uL (ref 0.1–1.0)
Monocytes Relative: 5 %
Neutro Abs: 2.3 10*3/uL (ref 1.7–7.7)
Neutrophils Relative %: 51 %
PLATELETS: 168 10*3/uL (ref 150–400)
RBC: 3.9 MIL/uL (ref 3.87–5.11)
RDW: 12.8 % (ref 11.5–15.5)
WBC: 4.6 10*3/uL (ref 4.0–10.5)

## 2017-06-09 NOTE — Patient Instructions (Signed)
[facility] [facility] at [facility] Discharge Instructions  RECOMMENDATIONS MADE BY THE CONSULTANT AND ANY TEST RESULTS WILL BE SENT TO YOUR REFERRING PHYSICIAN.  You were seen today by Dr. Perlov   Thank you for choosing [facility] at [facility] to provide your oncology and hematology care.  To afford each patient quality time with our provider, please arrive at least 15 minutes before your scheduled appointment time.    If you have a lab appointment with the [facility] please come in thru the  Main Entrance and check in at the main information desk  You need to re-schedule your appointment should you arrive 10 or more minutes late.  We strive to give you quality time with our providers, and arriving late affects you and other patients whose appointments are after yours.  Also, if you no show three or more times for appointments you may be dismissed from the clinic at the providers discretion.     Again, thank you for choosing [facility].  Our hope is that these requests will decrease the amount of time that you wait before being seen by our physicians.       _____________________________________________________________  Should you have questions after your visit to [facility], please contact our office at [phone] between the hours of 8:30 a.m. and 4:30 p.m.  Voicemails left after 4:30 p.m. will not be returned until the following business day.  For prescription refill requests, have your pharmacy contact our office.       Resources For Cancer Patients and their Caregivers ? American Cancer Society: Can assist with transportation, wigs, general needs, runs Look Good Feel Better.        1-888-227-6333 ? Cancer Care: Provides financial assistance, online support groups, medication/co-pay assistance.  1-800-813-HOPE (4673) ? [facility] Assists [place] cancer patients and their  families through emotional , educational and financial support.  336-427-4357 ? [place] DSS Where to apply for food stamps, Medicaid and utility assistance. 336-342-1394 ? RCATS: Transportation to medical appointments. 336-347-2287 ? Social Security Administration: May apply for disability if have a Stage IV cancer. 336-342-7796 1-800-772-1213 ? [place] Aging, Disability and Transit Services: Assists with nutrition, care and transit needs. 336-349-2343  [facility] Support Programs: @10RELATIVEDAYS@ > Cancer Support Group  2nd Tuesday of the month 1pm-2pm, Journey Room  > Creative Journey  3rd Tuesday of the month 1130am-1pm, Journey Room  > Look Good Feel Better  1st Wednesday of the month 10am-12 noon, Journey Room (Call American Cancer Society to register 1-800-395-5775)    

## 2017-06-15 NOTE — Progress Notes (Signed)
[facility] Follow-up Visit:  Assessment: Invasive ductal carcinoma metastasized to axillary lymph node, left 61 y.o. female with tremendous receptor positive carcinoma over the left breast with lymph node metastasis. Treated with partial mastectomy, currently receiving aromatase inhibitor therapy for the past three years. Tolerating treatment well with no radiographic evidence of disease recurrence as of November 2017. Interval DEXA Scan demonstrates preserved and maintained bone mineral density.  Physical examination finally reveals new area of tenderness with possible subcutaneous nodule Arity over the left ribs directly deep to the previous surgical incision. Patient reports that the tenderness is new and surprising to her. Additional evaluation is warranted.  Plan:  --CT Chest --RTC 2 weeks to review the findings --Next mammogram as scheduled in November --Next DEXA scan in Apr 2020  Voice recognition software was used and Agricultural engineer of this note. Despite my best effort at editing the text, some misspelling/errors may have occurred.   Orders Placed This Encounter  Procedures  . CT Chest W Contrast    Standing Status:   Future    Standing Expiration Date:   06/09/2018    Order Specific Question:   If indicated for the ordered procedure, I authorize the administration of contrast media per Radiology protocol    Answer:   Yes    Order Specific Question:   Preferred imaging location?    Answer:   [facility]    Order Specific Question:   Radiology Contrast Protocol - do NOT remove file path    Answer:   [email or web address]    Cancer Staging Invasive ductal carcinoma metastasized to axillary lymph node, left Staging form: Breast, AJCC 7th Edition - Clinical: Stage IIA (T1c, N1, cM0) - Signed by Baird Cancer, PA-C on 09/15/2013   All questions were answered. . The patient knows to call the clinic with any problems, questions  or concerns.  This note was electronically signed.    History of Presenting Illness Heather Vaughan 62 y.o. presenting to the [facility] for Breast cancer recurrence monitoring while undergoing adjuvant aromatase inhibitor therapy which was started 03/29/14. Most recent DEXA Scann 104/25/15 demonstrate stable bone mineral density, mammogram scheduled for next month. At the present time, patient denies any new symptoms. Denies any integral weight loss, a new musculoskeletal pain. Denies any new palpable masses on either side of the chest. Tolerating aromasin without difficulties..  Oncological/hematological History:   Invasive ductal carcinoma metastasized to axillary lymph node, left   05/28/2013 Imaging    Screening mammogram      06/19/2013 Imaging    Left diagnostic mammogram      06/25/2013 Imaging    CT CAP- borderline enlarged axillary lymph node on left.  Small pulmonary nodules noted but too small for PET or biopsy; surveillance recommended.       07/03/2013 Surgery    Left partial mastectomy.  Invasive ductal carcinoma. 1.4 cm.  POSITIVE MARGINS.  Additional extensive DCIS noted.  ER 74%, PR 16%, Ki-67 52%, HER2 NEGATIVE.      07/17/2013 Surgery    Left modified radiacl mastectomy with left axillary node dissection.  Negative residual cancer.  Negative inked margins.  1/12 positive lymph nodes.  FINAL MARGINS NEGATIVE.  LYMPH NODE IS HER2 POSITIVE.      08/13/2013 Imaging    MUGA scan shows left ventricular EF of 62%      08/27/2013 - 12/18/2013 Chemotherapy    TCH x 6 cycles      01/07/2014 -  Chemotherapy  Herceptin x 52 weeks      01/27/2014 - 03/13/2014 Radiation Therapy    By Dr. Pablo Ledger      03/29/2014 -  Anti-estrogen oral therapy    Aromasin      10/24/2014 Imaging    Bone density- BMD as determined from Femur Neck Right is 0.698 g/cm2 with a T-Score of -2.4. This patient is considered osteopenic according to [facility]) criteria.  (Lumbar spine was not utilized due to advanced degenerative changes).      11/05/2014 Imaging    MUGA- Left ventricular ejection fraction equals 70%. No change from prior.      04/27/2015 Procedure    Port-A-Cath removal by Dr. Arnoldo Morale      06/19/2015 Imaging    CT chest- Status post left mastectomy, without recurrent or metastatic disease. Decrease in size of right-sided pulmonary nodules, favoring an infectious or inflammatory etiology.       Medical History: Past Medical History:  Diagnosis Date  . Anxiety   . B12 deficiency 01/27/2016  . Barrett's esophagus   . Breast cancer ([place])   . Breast cancer, left breast ([place])   . Bronchitis 07/27/2011  . GERD (gastroesophageal reflux disease)   . Hypertension   . Osteopenia 10/09/2014    Surgical History: Past Surgical History:  Procedure Laterality Date  . BREAST BIOPSY Bilateral    X 4- benign  . ESOPHAGOGASTRODUODENOSCOPY  08/03/2011   Procedure: ESOPHAGOGASTRODUODENOSCOPY (EGD);  Surgeon: Rogene Houston, MD;  Location: [facility];  Service: Endoscopy;  Laterality: N/A;  12:45  . EXCISION MORTON'S NEUROMA  07/14/2011   Procedure: EXCISION MORTON'S NEUROMA;  Surgeon: Marcheta Grammes;  Location: [facility] ORS;  Service: Orthopedics;  Laterality: Right;  Removal Neuroma 3rd Interspace Right Foot  . MASTECTOMY MODIFIED RADICAL Left 07/17/2013   Procedure: MASTECTOMY MODIFIED RADICAL WITH LEFT AXILLARY TISSUE REMOVAL;  Surgeon: Scherry Ran, MD;  Location: [facility] OR[facility];  Service: General;  Laterality: Left;  . PARTIAL MASTECTOMY WITH NEEDLE LOCALIZATION Left 07/03/2013   Procedure: PARTIAL MASTECTOMY STATUS POST NEEDLE LOCALIZATION;  Surgeon: Scherry Ran, MD;  Location: [facility] OR[facility];  Service: General;  Laterality: Left;  . PORT-A-CATH REMOVAL Right 04/27/2015   Procedure: REMOVAL PORT-A-CATH;  Surgeon: Aviva Signs, MD;  Location: [facility] OR[facility];  Service: General;  Laterality: Right;  . PORTACATH PLACEMENT Right 08/16/2013   Procedure:  INSERTION PORT-A-CATH RIGHT SUBCLAVIAN;  Surgeon: Scherry Ran, MD;  Location: [facility];  Service: General;  Laterality: Right;    Family History: Family History  Problem Relation Age of Onset  . Anesthesia problems Neg Hx   . Hypotension Neg Hx   . Malignant hyperthermia Neg Hx   . Pseudochol deficiency Neg Hx     Social History: Social History   Social History  . Marital status: Married    Spouse name: N/A  . Number of children: N/A  . Years of education: N/A   Occupational History  . Not on file.   Social History Main Topics  . Smoking status: Former Smoker    Packs/day: 1.00    Years: 20.00    Types: Cigarettes    Quit date: 09/05/1993  . Smokeless tobacco: Never Used  . Alcohol use No     Comment: occasionally  . Drug use: No  . Sexual activity: Not on file   Other Topics Concern  . Not on file   Social History Narrative  . No narrative on file    Allergies: No Known Allergies  Medications:  Current Outpatient Prescriptions  Medication Sig Dispense Refill  . calcium-vitamin D (OSCAL WITH D) 500-200 MG-UNIT tablet Take 2 tablets by mouth daily with breakfast. 60 tablet 6  . DULoxetine (CYMBALTA) 30 MG capsule Take 30 mg by mouth daily.    Marland Kitchen exemestane (AROMASIN) 25 MG tablet Take 1 tablet (25 mg total) by mouth daily. 90 tablet 1  . losartan-hydrochlorothiazide (HYZAAR) 100-25 MG per tablet Take 1 tablet by mouth daily.      . Misc. Devices MISC Please provide patient with # 6 mastectomy bras. DX: Metastatic left breast cancer with history of left mastectomy. (C50.912, C77.3) 6 each 0  . omeprazole (PRILOSEC) 20 MG capsule Take 20 mg by mouth 2 (two) times daily.     . traZODone (DESYREL) 50 MG tablet Take 25 mg by mouth at bedtime as needed for sleep. For sleep     No current facility-administered medications for this visit.     Review of Systems: Review of Systems  All other systems reviewed and are negative.    PHYSICAL EXAMINATION Blood  pressure (!) 155/82, pulse 74, resp. rate 16, height '5\' 2"'  (1.575 m), weight 145 lb 9.6 oz (66 kg), SpO2 100 %.  ECOG PERFORMANCE STATUS: 0 - Asymptomatic  Physical Exam  Constitutional: She is oriented to person, place, and time and well-developed, well-nourished, and in no distress.  HENT:  Head: Normocephalic and atraumatic.  Eyes: Pupils are equal, round, and reactive to light. Conjunctivae and EOM are normal.  Neck: Normal range of motion. Neck supple.  Cardiovascular: Normal rate, regular rhythm and normal heart sounds.   Pulmonary/Chest: Effort normal and breath sounds normal. Right breast exhibits no inverted nipple, no mass, no nipple discharge, no skin change and no tenderness.    Abdominal: Soft. Bowel sounds are normal.  Musculoskeletal: Normal range of motion.  Neurological: She is alert and oriented to person, place, and time. Gait normal.  Skin: Skin is warm and dry.  Nursing note and vitals reviewed.    LABORATORY DATA: I have personally reviewed the data as listed: Appointment on 06/09/2017  Component Date Value Ref Range Status  . WBC 06/09/2017 4.6  4.0 - 10.5 K/uL Final  . RBC 06/09/2017 3.90  3.87 - 5.11 MIL/uL Final  . Hemoglobin 06/09/2017 13.0  12.0 - 15.0 g/dL Final  . HCT 06/09/2017 39.0  36.0 - 46.0 % Final  . MCV 06/09/2017 100.0  78.0 - 100.0 fL Final  . MCH 06/09/2017 33.3  26.0 - 34.0 pg Final  . MCHC 06/09/2017 33.3  30.0 - 36.0 g/dL Final  . RDW 06/09/2017 12.8  11.5 - 15.5 % Final  . Platelets 06/09/2017 168  150 - 400 K/uL Final  . Neutrophils Relative % 06/09/2017 51  % Final  . Neutro Abs 06/09/2017 2.3  1.7 - 7.7 K/uL Final  . Lymphocytes Relative 06/09/2017 36  % Final  . Lymphs Abs 06/09/2017 1.7  0.7 - 4.0 K/uL Final  . Monocytes Relative 06/09/2017 5  % Final  . Monocytes Absolute 06/09/2017 0.2  0.1 - 1.0 K/uL Final  . Eosinophils Relative 06/09/2017 7  % Final  . Eosinophils Absolute 06/09/2017 0.3  0.0 - 0.7 K/uL Final  .  Basophils Relative 06/09/2017 1  % Final  . Basophils Absolute 06/09/2017 0.0  0.0 - 0.1 K/uL Final  . Sodium 06/09/2017 141  135 - 145 mmol/L Final  . Potassium 06/09/2017 3.0* 3.5 - 5.1 mmol/L Final  . Chloride 06/09/2017 102  101 - 111 mmol/L Final  . CO2 06/09/2017 29  22 - 32 mmol/L Final  . Glucose, Bld 06/09/2017 99  65 - 99 mg/dL Final  . BUN 06/09/2017 13  6 - 20 mg/dL Final  . Creatinine, Ser 06/09/2017 0.76  0.44 - 1.00 mg/dL Final  . Calcium 06/09/2017 9.1  8.9 - 10.3 mg/dL Final  . Total Protein 06/09/2017 7.1  6.5 - 8.1 g/dL Final  . Albumin 06/09/2017 4.2  3.5 - 5.0 g/dL Final  . AST 06/09/2017 22  15 - 41 U/L Final  . ALT 06/09/2017 17  14 - 54 U/L Final  . Alkaline Phosphatase 06/09/2017 100  38 - 126 U/L Final  . Total Bilirubin 06/09/2017 0.8  0.3 - 1.2 mg/dL Final  . GFR calc non Af Amer 06/09/2017 >60  >60 mL/min Final  . GFR calc Af Amer 06/09/2017 >60  >60 mL/min Final   Comment: (NOTE) The eGFR has been calculated using the CKD EPI equation. This calculation has not been validated in all clinical situations. eGFR's persistently <60 mL/min signify possible Chronic Kidney Disease.   Georgiann Hahn gap 06/09/2017 10  5 - 15 Final       Ardath Sax, MD

## 2017-06-15 NOTE — Assessment & Plan Note (Signed)
61 y.o. female with tremendous receptor positive carcinoma over the left breast with lymph node metastasis. Treated with partial mastectomy, currently receiving aromatase inhibitor therapy for the past three years. Tolerating treatment well with no radiographic evidence of disease recurrence as of November 2017. Interval DEXA Scan demonstrates preserved and maintained bone mineral density.  Physical examination finally reveals new area of tenderness with possible subcutaneous nodule Arity over the left ribs directly deep to the previous surgical incision. Patient reports that the tenderness is new and surprising to her. Additional evaluation is warranted.  Plan:  --CT Chest --RTC 2 weeks to review the findings

## 2017-06-21 ENCOUNTER — Ambulatory Visit (HOSPITAL_COMMUNITY)
Admission: RE | Admit: 2017-06-21 | Discharge: 2017-06-21 | Disposition: A | Discharge status: Home / Self Care | Payer: BLUE CROSS/BLUE SHIELD | Source: Ambulatory Visit | Attending: Hematology and Oncology | Admitting: Hematology and Oncology

## 2017-06-21 DIAGNOSIS — C50912 Malignant neoplasm of unspecified site of left female breast: Secondary | ICD-10-CM

## 2017-06-21 DIAGNOSIS — I700 Atherosclerosis of aorta: Secondary | ICD-10-CM | POA: Diagnosis not present

## 2017-06-21 DIAGNOSIS — R918 Other nonspecific abnormal finding of lung field: Secondary | ICD-10-CM | POA: Diagnosis not present

## 2017-06-21 DIAGNOSIS — J432 Centrilobular emphysema: Secondary | ICD-10-CM | POA: Diagnosis not present

## 2017-06-21 DIAGNOSIS — C773 Secondary and unspecified malignant neoplasm of axilla and upper limb lymph nodes: Secondary | ICD-10-CM | POA: Insufficient documentation

## 2017-06-21 DIAGNOSIS — R0781 Pleurodynia: Secondary | ICD-10-CM | POA: Diagnosis not present

## 2017-06-21 DIAGNOSIS — J438 Other emphysema: Secondary | ICD-10-CM | POA: Diagnosis not present

## 2017-06-21 MED ORDER — IOPAMIDOL (ISOVUE-300) INJECTION 61%
75.0000 mL | Freq: Once | INTRAVENOUS | Status: AC | PRN
  Administered 2017-06-21: 75 mL via INTRAVENOUS

## 2017-06-26 ENCOUNTER — Encounter (HOSPITAL_COMMUNITY): Payer: Self-pay | Admitting: Oncology

## 2017-06-26 ENCOUNTER — Encounter (HOSPITAL_BASED_OUTPATIENT_CLINIC_OR_DEPARTMENT_OTHER): Payer: BLUE CROSS/BLUE SHIELD | Admitting: Oncology

## 2017-06-26 VITALS — BP 124/69 | HR 80 | Temp 99.0°F | Resp 16 | Wt 145.4 lb

## 2017-06-26 DIAGNOSIS — C773 Secondary and unspecified malignant neoplasm of axilla and upper limb lymph nodes: Secondary | ICD-10-CM

## 2017-06-26 DIAGNOSIS — C50912 Malignant neoplasm of unspecified site of left female breast: Secondary | ICD-10-CM | POA: Diagnosis not present

## 2017-06-26 DIAGNOSIS — C50919 Malignant neoplasm of unspecified site of unspecified female breast: Secondary | ICD-10-CM

## 2017-06-26 DIAGNOSIS — M810 Age-related osteoporosis without current pathological fracture: Secondary | ICD-10-CM | POA: Diagnosis not present

## 2017-06-26 NOTE — Progress Notes (Signed)
Jake Samples, PA-C [address] [place] [address]  No diagnosis found.  Progress Note  CURRENT THERAPY: Surveillance per NCCN guidelines  INTERVAL HISTORY: Heather Vaughan 61 y.o. 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 S/P left partial mastectomy on 07/03/2013 demonstrating a 1.4 cm invasive component that was ER 74%, PR 16%, Ki-67 52%, and HER2 negative. However, positive margins were noted and therefore on 07/17/2013 she underwent a left modified radical mastectomy and left axillary lymph node dissection. Clear margins were ascertained with 1/70 positive lymph nodes positive for metastatic disease. Testing on the positive lymph node reveals HER2 positivity. OncoType DX score was 29 placing her in the high-end of the intermediate risk group for recurrence. She was therefore started on [facility] therapy on 08/27/2013 and finished TC on 12/18/2013 after 6 cycles. She is S/P radiation therapy by Dr. Pablo Ledger on 01/27/2014- 03/14/2014. She is S/P completion of 52 weeks worth of Herceptin therapy. Now on Aromasin (beginning on on 03/29/2014).     Invasive ductal carcinoma metastasized to axillary lymph node, left   05/28/2013 Imaging    Screening mammogram      06/19/2013 Imaging    Left diagnostic mammogram      06/25/2013 Imaging    CT CAP- borderline enlarged axillary lymph node on left.  Small pulmonary nodules noted but too small for PET or biopsy; surveillance recommended.       07/03/2013 Surgery    Left partial mastectomy.  Invasive ductal carcinoma. 1.4 cm.  POSITIVE MARGINS.  Additional extensive DCIS noted.  ER 74%, PR 16%, Ki-67 52%, HER2 NEGATIVE.      07/17/2013 Surgery    Left modified radiacl mastectomy with left axillary node dissection.  Negative residual cancer.  Negative inked margins.  1/12 positive lymph nodes.  FINAL MARGINS NEGATIVE.  LYMPH NODE IS HER2 POSITIVE.      08/13/2013 Imaging     MUGA scan shows left ventricular EF of 62%      08/27/2013 - 12/18/2013 Chemotherapy    TCH x 6 cycles      01/07/2014 -  Chemotherapy    Herceptin x 52 weeks      01/27/2014 - 03/13/2014 Radiation Therapy    By Dr. Pablo Ledger      03/29/2014 -  Anti-estrogen oral therapy    Aromasin      10/24/2014 Imaging    Bone density- BMD as determined from Femur Neck Right is 0.698 g/cm2 with a T-Score of -2.4. This patient is considered osteopenic according to [facility]) criteria. (Lumbar spine was not utilized due to advanced degenerative changes).      11/05/2014 Imaging    MUGA- Left ventricular ejection fraction equals 70%. No change from prior.      04/27/2015 Procedure    Port-A-Cath removal by Dr. Arnoldo Morale      06/19/2015 Imaging    CT chest- Status post left mastectomy, without recurrent or metastatic disease. Decrease in size of right-sided pulmonary nodules, favoring an infectious or inflammatory etiology.      06/21/2017 Imaging    CT Chest w contrast: IMPRESSION: 1. No evidence of metastatic disease in the thorax. Multiple previously noted tiny pulmonary nodules are stable compared to prior studies and considered benign. 2. Aortic atherosclerosis. 3. Mild diffuse bronchial wall thickening with mild centrilobular and paraseptal emphysema most apparent the lung apices; imaging findings that may suggest underlying COPD.  Heather Vaughan presents to the clinic for continuing  follow up to review CT scan. She states that her left chest wall discomfort has decreased.  She continues to take Aromasin and has not had any related side effects or symptoms. She states she is also taking calcium and vitamin D with her Aromasin.   Denies, hot flashes, nausea,  vomitting,  joint pain, chest pain, fatigue, sob, and abdominal pian.    Review of Systems  Constitutional: Negative.  Negative for malaise/fatigue.       Denies hot flashes   HENT: Negative.     Eyes: Negative.   Respiratory: Negative.  Negative for shortness of breath.   Cardiovascular: Negative.  Negative for chest pain.  Gastrointestinal: Negative.  Negative for abdominal pain, nausea and vomiting.  Genitourinary: Negative.   Musculoskeletal: Negative.  Negative for joint pain.  Skin: Negative.   Neurological: Negative.   Endo/Heme/Allergies: Negative.   Psychiatric/Behavioral: Negative.   All other systems reviewed and are negative.   Past Medical History:  Diagnosis Date  . Anxiety   . B12 deficiency 01/27/2016  . Barrett's esophagus   . Breast cancer ([place])   . Breast cancer, left breast ([place])   . Bronchitis 07/27/2011  . GERD (gastroesophageal reflux disease)   . Hypertension   . Osteopenia 10/09/2014    Past Surgical History:  Procedure Laterality Date  . BREAST BIOPSY Bilateral    X 4- benign  . ESOPHAGOGASTRODUODENOSCOPY  08/03/2011   Procedure: ESOPHAGOGASTRODUODENOSCOPY (EGD);  Surgeon: Rogene Houston, MD;  Location: [facility];  Service: Endoscopy;  Laterality: N/A;  12:45  . EXCISION MORTON'S NEUROMA  07/14/2011   Procedure: EXCISION MORTON'S NEUROMA;  Surgeon: Marcheta Grammes;  Location: [facility] ORS;  Service: Orthopedics;  Laterality: Right;  Removal Neuroma 3rd Interspace Right Foot  . MASTECTOMY MODIFIED RADICAL Left 07/17/2013   Procedure: MASTECTOMY MODIFIED RADICAL WITH LEFT AXILLARY TISSUE REMOVAL;  Surgeon: Scherry Ran, MD;  Location: [facility] ORS;  Service: General;  Laterality: Left;  . PARTIAL MASTECTOMY WITH NEEDLE LOCALIZATION Left 07/03/2013   Procedure: PARTIAL MASTECTOMY STATUS POST NEEDLE LOCALIZATION;  Surgeon: Scherry Ran, MD;  Location: [facility] OR[facility];  Service: General;  Laterality: Left;  . PORT-A-CATH REMOVAL Right 04/27/2015   Procedure: REMOVAL PORT-A-CATH;  Surgeon: Aviva Signs, MD;  Location: [facility] OR[facility];  Service: General;  Laterality: Right;  . PORTACATH PLACEMENT Right 08/16/2013   Procedure: INSERTION PORT-A-CATH RIGHT  SUBCLAVIAN;  Surgeon: Scherry Ran, MD;  Location: [facility];  Service: General;  Laterality: Right;    Family History  Problem Relation Age of Onset  . Anesthesia problems Neg Hx   . Hypotension Neg Hx   . Malignant hyperthermia Neg Hx   . Pseudochol deficiency Neg Hx     Social History   Social History  . Marital status: Married    Spouse name: N/A  . Number of children: N/A  . Years of education: N/A   Social History Main Topics  . Smoking status: Former Smoker    Packs/day: 1.00    Years: 20.00    Types: Cigarettes    Quit date: 09/05/1993  . Smokeless tobacco: Never Used  . Alcohol use No     Comment: occasionally  . Drug use: No  . Sexual activity: Not on file   Other Topics Concern  . Not on file   Social History Narrative  . No narrative on file     PHYSICAL EXAMINATION  ECOG PERFORMANCE STATUS: 0 - Asymptomatic  Vitals:   06/26/17 1420  BP: 124/69  Pulse: 80  Resp: 16  Temp: 99 F (37.2 C)  SpO2: 99%   Filed Weights   06/26/17 1420  Weight: 145 lb 6.4 oz (66 kg)      Physical Exam  Constitutional: She is oriented to person, place, and time and well-developed, well-nourished, and in no distress.  HENT:  Head: Normocephalic and atraumatic.  Eyes: Pupils are equal, round, and reactive to light. Conjunctivae and EOM are normal.  Neck: Normal range of motion. Neck supple.  Cardiovascular: Normal rate, regular rhythm and normal heart sounds.   Pulmonary/Chest: Effort normal and breath sounds normal.  Abdominal: Soft. Bowel sounds are normal.  Musculoskeletal: Normal range of motion.  Neurological: She is alert and oriented to person, place, and time. Gait normal.  Skin: Skin is warm and dry.  Nursing note and vitals reviewed.   LABORATORY DATA: CBC    Component Value Date/Time   WBC 4.6 06/09/2017 1009   RBC 3.90 06/09/2017 1009   HGB 13.0 06/09/2017 1009   HCT 39.0 06/09/2017 1009   PLT 168 06/09/2017 1009   MCV 100.0 06/09/2017  1009   MCH 33.3 06/09/2017 1009   MCHC 33.3 06/09/2017 1009   RDW 12.8 06/09/2017 1009   LYMPHSABS 1.7 06/09/2017 1009   MONOABS 0.2 06/09/2017 1009   EOSABS 0.3 06/09/2017 1009   BASOSABS 0.0 06/09/2017 1009      Chemistry      Component Value Date/Time   NA 141 06/09/2017 1009   K 3.0 (L) 06/09/2017 1009   CL 102 06/09/2017 1009   CO2 29 06/09/2017 1009   BUN 13 06/09/2017 1009   CREATININE 0.76 06/09/2017 1009      Component Value Date/Time   CALCIUM 9.1 06/09/2017 1009   ALKPHOS 100 06/09/2017 1009   AST 22 06/09/2017 1009   ALT 17 06/09/2017 1009   BILITOT 0.8 06/09/2017 1009        PENDING LABS:   RADIOGRAPHIC STUDIES: I have personally reviewed the radiological images as listed and agreed with the findings in the report. Ct Chest W Contrast  Result Date: 06/21/2017 CLINICAL DATA:  61 year old female with tenderness in the left breast on physical examination. History of left-sided breast cancer diagnosed in 2014 status post surgical resection, chemotherapy and radiation therapy. EXAM: CT CHEST WITH CONTRAST TECHNIQUE: Multidetector CT imaging of the chest was performed during intravenous contrast administration. CONTRAST:  86m ISOVUE-300 IOPAMIDOL (ISOVUE-300) INJECTION 61% COMPARISON:  Chest CT for 09/04/2016. FINDINGS: Cardiovascular: Heart size is normal. There is no significant pericardial fluid, thickening or pericardial calcification. Atherosclerosis in the thoracic aorta without evidence of aneurysm or dissection. No coronary artery calcifications. Mediastinum/Nodes: No pathologically enlarged mediastinal or hilar lymph nodes. Please note that accurate exclusion of hilar adenopathy is limited on noncontrast CT scans. Moderate-sized hiatal hernia. No axillary lymphadenopathy. Status post left axillary lymph node dissection. Lungs/Pleura: Multiple tiny pulmonary nodules scattered throughout the lungs bilaterally, unchanged in size, number and distribution compared  to the prior examination. The largest of these nodules is a 4 x 7 mm (mean diameter 5.5 mm) subpleural nodule in the periphery of the right lower lobe (axial image 101 of series 4) which is similar to prior studies dating back to 06/19/2015, considered a benign subpleural lymph node. No do suspicious-appearing pulmonary nodules or masses. No acute consolidative airspace disease. No pleural effusions. Mild diffuse bronchial thickening with very mild centrilobular and paraseptal emphysema most apparent the lung apices. Upper Abdomen:  Unremarkable. Musculoskeletal: Status post left modified radical mastectomy and left axillary nodal dissection. There are no aggressive appearing lytic or blastic lesions noted in the visualized portions of the skeleton. IMPRESSION: 1. No evidence of metastatic disease in the thorax. Multiple previously noted tiny pulmonary nodules are stable compared to prior studies and considered benign. 2. Aortic atherosclerosis. 3. Mild diffuse bronchial wall thickening with mild centrilobular and paraseptal emphysema most apparent the lung apices; imaging findings that may suggest underlying COPD. Aortic Atherosclerosis (ICD10-I70.0) and Emphysema (ICD10-J43.9). Electronically Signed   By: Vinnie Langton M.D.   On: 06/21/2017 09:30    CT CHEST WITH CONTRAST 09/14/2016  IMPRESSION: 1. The various small pulmonary nodules appear stable back through 06/25/2013 and accordingly are unlikely to represent active malignancy. 2. Atherosclerosis. 3. Stable mildly enlarged right lower paratracheal lymph node at 1.2 cm the. 4. Small to moderate hiatal hernia.  PATHOLOGY:    ASSESSMENT AND PLAN:  1. 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 S/P left partial mastectomy on 07/03/2013 demonstrating a 1.4 cm invasive component that was ER 74%, PR 16%, Ki-67 52%, and HER2 negative. However, positive margins were noted and therefore on 07/17/2013 she underwent a left modified  radical mastectomy and left axillary lymph node dissection. Clear margins were ascertained with 1/09 positive lymph nodes positive for metastatic disease. Testing on the positive lymph node reveals HER2 positivity. OncoType DX score was 29 placing her in the high-end of the intermediate risk group for recurrence. She was therefore started on [facility] therapy on 08/27/2013 and finished TC on 12/18/2013 after 6 cycles. She is S/P radiation therapy by Dr. Pablo Ledger on 01/27/2014- 03/14/2014. She is S/P completion of 52 weeks worth of Herceptin therapy.  Now on Aromasin (beginning on on 03/29/2014).    PLAN: Reviewed patient's CT chest in detail with her. She does not have any evidence of metastatic disease.  Her left chest wall pain is likely due to stretching from scar tissue vs. Costochondritis. Recommended for patient to take NSAIDS and see if her symptoms improve.   Annual mammogram scheduled for 07/17/17.  Continue aromasin through 03/2019 to complete 5 years of endocrine therapy. Tolerating it well.  2. Osteoporosis  Plan: -Continue calcium+vitamin D. Last DEXA on 11/2016 demonstrated osteoporosis.   -She was previously on prolia for her osteopenia (10/09/2014-1//30/2017) however had to stop due to insurance costs. She was then placed on fosamax however I do not believe patient took this medication.  RTC in 6 months for follow up with CBC and CMP.   ORDERS PLACED FOR THIS ENCOUNTER: Orders Placed This Encounter  Procedures  . CBC with Differential  . Comprehensive metabolic panel      THERAPY PLAN:  NCCN guidelines recommends the following surveillance for invasive breast cancer (2.2017):  A. History and Physical exam 1-4 times per year as clinically appropriate for 5 years, then annually.  B. Periodic screening for changes in family history and referral to genetics counseling as indicated  C. Educate, monitor, and refer to lymphedema management.  D. Mammography every 12 months  E. Routine  imaging of reconstructed breast is not indicated.  F. In the absence of clinical signs and symptoms suggestive of recurrent disease, there is no indication for laboratory or imaging studies for metastases screening.  G. Women on Tamoxifen: annual gynecologic assessment every 12 months if uterus is present.  H. Women on aromatase inhibitor or who experience ovarian failure secondary to treatment should have monitoring of bone health with a  bone mineral density determination at baseline and periodically thereafter.  I. Assess and encourage adherence to adjuvant endocrine therapy.  J. Evidence suggests that active lifestyle, healthy diet, limited alcohol intake, and achieving and maintaining an ideal body weight (20-25 BMI) may lead to optimal breast cancer outcomes.    All questions were answered. The patient knows to call the clinic with any problems, questions or concerns. We can certainly see the patient much sooner if necessary.   This note is electronically signed by: Twana First, MD 06/26/2017 1:49 PM

## 2017-06-26 NOTE — Patient Instructions (Signed)
[facility] at [facility] Discharge Instructions  RECOMMENDATIONS MADE BY THE CONSULTANT AND ANY TEST RESULTS WILL BE SENT TO YOUR REFERRING PHYSICIAN.  You were seen today by Dr. Louise Zhou Follow up in 6 months with labs   Thank you for choosing [facility] at [facility] to provide your oncology and hematology care.  To afford each patient quality time with our provider, please arrive at least 15 minutes before your scheduled appointment time.    If you have a lab appointment with the Cancer Center please come in thru the  Main Entrance and check in at the main information desk  You need to re-schedule your appointment should you arrive 10 or more minutes late.  We strive to give you quality time with our providers, and arriving late affects you and other patients whose appointments are after yours.  Also, if you no show three or more times for appointments you may be dismissed from the clinic at the providers discretion.     Again, thank you for choosing [facility].  Our hope is that these requests will decrease the amount of time that you wait before being seen by our physicians.       _____________________________________________________________  Should you have questions after your visit to [facility], please contact our office at [phone] between the hours of 8:30 a.m. and 4:30 p.m.  Voicemails left after 4:30 p.m. will not be returned until the following business day.  For prescription refill requests, have your pharmacy contact our office.       Resources For Cancer Patients and their Caregivers ? American Cancer Society: Can assist with transportation, wigs, general needs, runs Look Good Feel Better.        1-888-227-6333 ? Cancer Care: Provides financial assistance, online support groups, medication/co-pay assistance.  1-800-813-HOPE (4673) ? [facility] Assists  [place] cancer patients and their families through emotional , educational and financial support.  336-427-4357 ? [place] DSS Where to apply for food stamps, Medicaid and utility assistance. 336-342-1394 ? [facility]: Transportation to medical appointments. 336-347-2287 ? Social Security Administration: May apply for disability if have a Stage IV cancer. 336-342-7796 1-800-772-1213 ? [place] Aging, Disability and Transit Services: Assists with nutrition, care and transit needs. 336-349-2343  Cancer Center Support Programs: @10RELATIVEDAYS@ > Cancer Support Group  2nd Tuesday of the month 1pm-2pm, Journey Room  > Creative Journey  3rd Tuesday of the month 1130am-1pm, Journey Room  > Look Good Feel Better  1st Wednesday of the month 10am-12 noon, Journey Room (Call American Cancer Society to register 1-800-395-5775)    

## 2017-07-17 ENCOUNTER — Ambulatory Visit (HOSPITAL_COMMUNITY)
Admission: RE | Admit: 2017-07-17 | Discharge: 2017-07-17 | Disposition: A | Discharge status: Home / Self Care | Payer: BLUE CROSS/BLUE SHIELD | Source: Ambulatory Visit | Attending: Oncology | Admitting: Oncology

## 2017-07-17 ENCOUNTER — Encounter (HOSPITAL_COMMUNITY): Payer: Self-pay

## 2017-07-17 DIAGNOSIS — Z1231 Encounter for screening mammogram for malignant neoplasm of breast: Secondary | ICD-10-CM | POA: Insufficient documentation

## 2017-09-07 ENCOUNTER — Telehealth (HOSPITAL_COMMUNITY): Payer: Self-pay | Admitting: Adult Health

## 2017-09-07 NOTE — Telephone Encounter (Signed)
Pt called asking for assistance with her Aromasin until her ins becomes effective. Recved sig for Shanon Brow to give pt assist. I will fax script to [facility] and have rx mailed to pts home.

## 2017-09-08 ENCOUNTER — Other Ambulatory Visit (HOSPITAL_COMMUNITY): Payer: Self-pay | Admitting: *Deleted

## 2017-09-08 DIAGNOSIS — C50912 Malignant neoplasm of unspecified site of left female breast: Secondary | ICD-10-CM

## 2017-09-08 DIAGNOSIS — C773 Secondary and unspecified malignant neoplasm of axilla and upper limb lymph nodes: Principal | ICD-10-CM

## 2017-09-08 MED ORDER — EXEMESTANE 25 MG PO TABS: 25.0000 mg | ORAL_TABLET | Freq: Every day | ORAL | 1 refills | Status: DC

## 2017-09-11 ENCOUNTER — Other Ambulatory Visit (HOSPITAL_COMMUNITY): Payer: Self-pay | Admitting: Emergency Medicine

## 2017-09-11 DIAGNOSIS — C773 Secondary and unspecified malignant neoplasm of axilla and upper limb lymph nodes: Principal | ICD-10-CM

## 2017-09-11 DIAGNOSIS — C50912 Malignant neoplasm of unspecified site of left female breast: Secondary | ICD-10-CM

## 2017-09-11 MED ORDER — EXEMESTANE 25 MG PO TABS: 25.0000 mg | ORAL_TABLET | Freq: Every day | ORAL | 1 refills | Status: DC

## 2017-09-11 MED FILL — EXEMESTANE 25 MG TABLET: 25 | 90 days supply | Qty: 90 | Fill #0

## 2017-12-01 MED FILL — EXEMESTANE 25 MG TABLET: 25 | 90 days supply | Qty: 90 | Fill #1

## 2017-12-21 ENCOUNTER — Other Ambulatory Visit (HOSPITAL_COMMUNITY): Payer: Self-pay

## 2017-12-21 DIAGNOSIS — C773 Secondary and unspecified malignant neoplasm of axilla and upper limb lymph nodes: Principal | ICD-10-CM

## 2017-12-21 DIAGNOSIS — C50919 Malignant neoplasm of unspecified site of unspecified female breast: Secondary | ICD-10-CM

## 2017-12-22 ENCOUNTER — Inpatient Hospital Stay (HOSPITAL_COMMUNITY): Payer: BLUE CROSS/BLUE SHIELD | Attending: Hematology

## 2017-12-22 DIAGNOSIS — K219 Gastro-esophageal reflux disease without esophagitis: Secondary | ICD-10-CM | POA: Diagnosis not present

## 2017-12-22 DIAGNOSIS — F419 Anxiety disorder, unspecified: Secondary | ICD-10-CM | POA: Insufficient documentation

## 2017-12-22 DIAGNOSIS — E538 Deficiency of other specified B group vitamins: Secondary | ICD-10-CM | POA: Diagnosis not present

## 2017-12-22 DIAGNOSIS — M25512 Pain in left shoulder: Secondary | ICD-10-CM | POA: Insufficient documentation

## 2017-12-22 DIAGNOSIS — Z170 Estrogen receptor positive status [ER+]: Secondary | ICD-10-CM | POA: Insufficient documentation

## 2017-12-22 DIAGNOSIS — Z79899 Other long term (current) drug therapy: Secondary | ICD-10-CM | POA: Insufficient documentation

## 2017-12-22 DIAGNOSIS — I10 Essential (primary) hypertension: Secondary | ICD-10-CM | POA: Insufficient documentation

## 2017-12-22 DIAGNOSIS — I700 Atherosclerosis of aorta: Secondary | ICD-10-CM | POA: Insufficient documentation

## 2017-12-22 DIAGNOSIS — C50912 Malignant neoplasm of unspecified site of left female breast: Secondary | ICD-10-CM | POA: Diagnosis not present

## 2017-12-22 DIAGNOSIS — C773 Secondary and unspecified malignant neoplasm of axilla and upper limb lymph nodes: Secondary | ICD-10-CM | POA: Insufficient documentation

## 2017-12-22 DIAGNOSIS — M810 Age-related osteoporosis without current pathological fracture: Secondary | ICD-10-CM | POA: Diagnosis not present

## 2017-12-22 DIAGNOSIS — Z87891 Personal history of nicotine dependence: Secondary | ICD-10-CM | POA: Insufficient documentation

## 2017-12-22 DIAGNOSIS — E876 Hypokalemia: Secondary | ICD-10-CM | POA: Diagnosis not present

## 2017-12-22 DIAGNOSIS — R918 Other nonspecific abnormal finding of lung field: Secondary | ICD-10-CM | POA: Insufficient documentation

## 2017-12-22 DIAGNOSIS — K2270 Barrett's esophagus without dysplasia: Secondary | ICD-10-CM | POA: Insufficient documentation

## 2017-12-22 DIAGNOSIS — C50919 Malignant neoplasm of unspecified site of unspecified female breast: Secondary | ICD-10-CM

## 2017-12-22 DIAGNOSIS — Z923 Personal history of irradiation: Secondary | ICD-10-CM | POA: Insufficient documentation

## 2017-12-22 DIAGNOSIS — Z9221 Personal history of antineoplastic chemotherapy: Secondary | ICD-10-CM | POA: Insufficient documentation

## 2017-12-22 DIAGNOSIS — M8580 Other specified disorders of bone density and structure, unspecified site: Secondary | ICD-10-CM | POA: Insufficient documentation

## 2017-12-22 LAB — CBC WITH DIFFERENTIAL/PLATELET
Basophils Absolute: 0 10*3/uL (ref 0.0–0.1)
Basophils Relative: 1 %
Eosinophils Absolute: 0.3 10*3/uL (ref 0.0–0.7)
Eosinophils Relative: 7 %
HEMATOCRIT: 37.1 % (ref 36.0–46.0)
Hemoglobin: 12.1 g/dL (ref 12.0–15.0)
LYMPHS ABS: 1.3 10*3/uL (ref 0.7–4.0)
LYMPHS PCT: 32 %
MCH: 32.3 pg (ref 26.0–34.0)
MCHC: 32.6 g/dL (ref 30.0–36.0)
MCV: 98.9 fL (ref 78.0–100.0)
Monocytes Absolute: 0.2 10*3/uL (ref 0.1–1.0)
Monocytes Relative: 5 %
Neutro Abs: 2.3 10*3/uL (ref 1.7–7.7)
Neutrophils Relative %: 55 %
PLATELETS: 156 10*3/uL (ref 150–400)
RBC: 3.75 MIL/uL — AB (ref 3.87–5.11)
RDW: 12.8 % (ref 11.5–15.5)
WBC: 4.2 10*3/uL (ref 4.0–10.5)

## 2017-12-22 LAB — COMPREHENSIVE METABOLIC PANEL
ALK PHOS: 97 U/L (ref 38–126)
ALT: 20 U/L (ref 14–54)
AST: 21 U/L (ref 15–41)
Albumin: 4 g/dL (ref 3.5–5.0)
Anion gap: 13 (ref 5–15)
BILIRUBIN TOTAL: 0.8 mg/dL (ref 0.3–1.2)
BUN: 18 mg/dL (ref 6–20)
CALCIUM: 9.5 mg/dL (ref 8.9–10.3)
CO2: 25 mmol/L (ref 22–32)
Chloride: 101 mmol/L (ref 101–111)
Creatinine, Ser: 0.79 mg/dL (ref 0.44–1.00)
Glucose, Bld: 154 mg/dL — ABNORMAL HIGH (ref 65–99)
Potassium: 3.3 mmol/L — ABNORMAL LOW (ref 3.5–5.1)
Sodium: 139 mmol/L (ref 135–145)
TOTAL PROTEIN: 6.8 g/dL (ref 6.5–8.1)

## 2017-12-22 LAB — LACTATE DEHYDROGENASE: LDH: 160 U/L (ref 98–192)

## 2017-12-25 ENCOUNTER — Encounter (HOSPITAL_COMMUNITY): Payer: Self-pay | Admitting: Internal Medicine

## 2017-12-25 ENCOUNTER — Other Ambulatory Visit: Payer: Self-pay

## 2017-12-25 ENCOUNTER — Inpatient Hospital Stay (HOSPITAL_BASED_OUTPATIENT_CLINIC_OR_DEPARTMENT_OTHER): Payer: BLUE CROSS/BLUE SHIELD | Admitting: Internal Medicine

## 2017-12-25 VITALS — BP 147/87 | HR 79 | Resp 16 | Ht 62.0 in | Wt 148.1 lb

## 2017-12-25 DIAGNOSIS — I10 Essential (primary) hypertension: Secondary | ICD-10-CM | POA: Diagnosis not present

## 2017-12-25 DIAGNOSIS — M810 Age-related osteoporosis without current pathological fracture: Secondary | ICD-10-CM | POA: Diagnosis not present

## 2017-12-25 DIAGNOSIS — Z87891 Personal history of nicotine dependence: Secondary | ICD-10-CM

## 2017-12-25 DIAGNOSIS — M8580 Other specified disorders of bone density and structure, unspecified site: Secondary | ICD-10-CM | POA: Diagnosis not present

## 2017-12-25 DIAGNOSIS — Z923 Personal history of irradiation: Secondary | ICD-10-CM

## 2017-12-25 DIAGNOSIS — Z170 Estrogen receptor positive status [ER+]: Secondary | ICD-10-CM

## 2017-12-25 DIAGNOSIS — Z9221 Personal history of antineoplastic chemotherapy: Secondary | ICD-10-CM

## 2017-12-25 DIAGNOSIS — M25512 Pain in left shoulder: Secondary | ICD-10-CM

## 2017-12-25 DIAGNOSIS — C773 Secondary and unspecified malignant neoplasm of axilla and upper limb lymph nodes: Secondary | ICD-10-CM

## 2017-12-25 DIAGNOSIS — K219 Gastro-esophageal reflux disease without esophagitis: Secondary | ICD-10-CM

## 2017-12-25 DIAGNOSIS — Z79899 Other long term (current) drug therapy: Secondary | ICD-10-CM

## 2017-12-25 DIAGNOSIS — C50912 Malignant neoplasm of unspecified site of left female breast: Secondary | ICD-10-CM

## 2017-12-25 DIAGNOSIS — F419 Anxiety disorder, unspecified: Secondary | ICD-10-CM

## 2017-12-25 DIAGNOSIS — I700 Atherosclerosis of aorta: Secondary | ICD-10-CM

## 2017-12-25 DIAGNOSIS — K2270 Barrett's esophagus without dysplasia: Secondary | ICD-10-CM

## 2017-12-25 DIAGNOSIS — R918 Other nonspecific abnormal finding of lung field: Secondary | ICD-10-CM | POA: Diagnosis not present

## 2017-12-25 DIAGNOSIS — E876 Hypokalemia: Secondary | ICD-10-CM

## 2017-12-25 DIAGNOSIS — E538 Deficiency of other specified B group vitamins: Secondary | ICD-10-CM | POA: Diagnosis not present

## 2017-12-25 NOTE — Progress Notes (Signed)
Diagnosis Carcinoma of left breast metastatic to axillary lymph node ([place]) - Plan: MM Digital Screening Unilat R  Staging Cancer Staging Invasive ductal carcinoma metastasized to axillary lymph node, left Staging form: Breast, AJCC 7th Edition - Clinical: Stage IIA (T1c, N1, cM0) - Signed by Baird Cancer, PA-C on 09/15/2013   Assessment and Plan: 1. 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 S/P left partial mastectomy on 07/03/2013 demonstrating a 1.4 cm invasive component that was ER 74%, PR 16%, Ki-67 52%, and HER2 negative. However, positive margins were noted and therefore on 07/17/2013 she underwent a left modified radical mastectomy and left axillary lymph node dissection. Clear margins were ascertained with 7/25 positive lymph nodes positive for metastatic disease. Testing on the positive lymph node reveals HER2 positivity. OncoType DX score was 29 placing her in the high-end of the intermediate risk group for recurrence. She was therefore started on [facility] therapy on 08/27/2013 and finished TC on 12/18/2013 after 6 cycles. She is S/P radiation therapy by Dr. Pablo Ledger on 01/27/2014- 03/14/2014. She is S/P completion of 52 weeks worth of Herceptin therapy.  Now on Aromasin (beginning on on 03/29/2014).    She was followed by Dr. Talbert Cage.  She had CT chest done 06/21/2018 that showed:    IMPRESSION: 1. No evidence of metastatic disease in the thorax. Multiple previously noted tiny pulmonary nodules are stable compared to prior studies and considered benign. 2. Aortic atherosclerosis. 3. Mild diffuse bronchial wall thickening with mild centrilobular and paraseptal emphysema most apparent the lung apices; imaging findings that may suggest underlying COPD. Aortic Atherosclerosis (ICD10-I70.0) and Emphysema (ICD10-J43.9). Electronically Signed   By: Vinnie Langton M.D.   On: 06/21/2017 09:30   She will be set up for screening right breast mammogram in 06/2018 and will follow-up to  go over results.  She is advised to notify the office if any problems prior to her next visit.  Pt should ontinue aromasin through 03/2019 to complete 5 years of endocrine therapy. May discuss extended adjuvant therapy at that time.    2.  2. Osteoporosis.  This was noted on DEXA on 11/2016 that demonstrated osteoporosis.  She was recommended for calcium and vitamin D.  Will ask for review to determine if she can resume Prolia which she was previously on for her osteopenia (10/09/2014-1//30/2017) however had to stop due to insurance costs. She was reportedly recommended for fosamax but pt is not taking medication.    3.  Joint pain.  She reports some left scapular pain that comes and goes.  She was given the option of bone scan for further evaluation but does not desire to have that performed.  She is advised to notify the office if symptoms worsen and she will be set up for imaging.  4.  Health maintenance.  Continue GI evaluation as recommended.  5.  Hypertension.  Blood pressure 147/87.  Follow with PCP.  6.  Hypokalemia.  Potassium slightly decreased at 3.3.  Encourage potassium rich foods.   Interval History:  Heather Vaughan 62 y.o. 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 S/P left partial mastectomy on 07/03/2013 demonstrating a 1.4 cm invasive component that was ER 74%, PR 16%, Ki-67 52%, and HER2 negative. However, positive margins were noted and therefore on 07/17/2013 she underwent a left modified radical mastectomy and left axillary lymph node dissection. Clear margins were ascertained with 3/66 positive lymph nodes positive for metastatic disease. Testing  on the positive lymph node reveals HER2 positivity. OncoType DX score was 29 placing her in the high-end of the intermediate risk group for recurrence. She was therefore started on [facility] therapy on 08/27/2013 and finished TC on 12/18/2013 after 6 cycles. She is S/P radiation therapy by Dr.  Pablo Ledger on 01/27/2014- 03/14/2014. She is S/P completion of 52 weeks worth of Herceptin therapy. Now on Aromasin (beginning on on 03/29/2014).   Current Status: Patient is seen today for follow-up.  She is complaining of some joint discomfort.  When questioned, she is not on Fosamax.    Invasive ductal carcinoma metastasized to axillary lymph node, left   05/28/2013 Imaging    Screening mammogram      06/19/2013 Imaging    Left diagnostic mammogram      06/25/2013 Imaging    CT CAP- borderline enlarged axillary lymph node on left.  Small pulmonary nodules noted but too small for PET or biopsy; surveillance recommended.       07/03/2013 Surgery    Left partial mastectomy.  Invasive ductal carcinoma. 1.4 cm.  POSITIVE MARGINS.  Additional extensive DCIS noted.  ER 74%, PR 16%, Ki-67 52%, HER2 NEGATIVE.      07/17/2013 Surgery    Left modified radiacl mastectomy with left axillary node dissection.  Negative residual cancer.  Negative inked margins.  1/12 positive lymph nodes.  FINAL MARGINS NEGATIVE.  LYMPH NODE IS HER2 POSITIVE.      08/13/2013 Imaging    MUGA scan shows left ventricular EF of 62%      08/27/2013 - 12/18/2013 Chemotherapy    TCH x 6 cycles      01/07/2014 -  Chemotherapy    Herceptin x 52 weeks      01/27/2014 - 03/13/2014 Radiation Therapy    By Dr. Pablo Ledger      03/29/2014 -  Anti-estrogen oral therapy    Aromasin      10/24/2014 Imaging    Bone density- BMD as determined from Femur Neck Right is 0.698 g/cm2 with a T-Score of -2.4. This patient is considered osteopenic according to [facility]) criteria. (Lumbar spine was not utilized due to advanced degenerative changes).      11/05/2014 Imaging    MUGA- Left ventricular ejection fraction equals 70%. No change from prior.      04/27/2015 Procedure    Port-A-Cath removal by Dr. Arnoldo Morale      06/19/2015 Imaging    CT chest- Status post left mastectomy, without recurrent or metastatic  disease. Decrease in size of right-sided pulmonary nodules, favoring an infectious or inflammatory etiology.      06/21/2017 Imaging    CT Chest w contrast: IMPRESSION: 1. No evidence of metastatic disease in the thorax. Multiple previously noted tiny pulmonary nodules are stable compared to prior studies and considered benign. 2. Aortic atherosclerosis. 3. Mild diffuse bronchial wall thickening with mild centrilobular and paraseptal emphysema most apparent the lung apices; imaging findings that may suggest underlying COPD.        Problem List Patient Active Problem List   Diagnosis Date Noted  . B12 deficiency [E53.8] 01/27/2016  . Osteopenia [M85.80] 10/09/2014  . Peripheral neuropathy due to chemotherapy ([place]) [G62.0, T45.1X5A] 05/08/2014  . Unstable balance [R26.89] 05/08/2014  . Stiffness of joint, not elsewhere classified, ankle and foot [M25.673, M25.676] 05/08/2014  . Ankle weakness [R29.898] 05/08/2014  . Invasive ductal carcinoma metastasized to axillary lymph node, left [C50.919, C77.3] 08/09/2013  . Barrett esophagus [K22.70] 12/22/2011  . GERD (  gastroesophageal reflux disease) [K21.9] 06/23/2011  . Hypertension [I10] 06/23/2011    Past Medical History Past Medical History:  Diagnosis Date  . Anxiety   . B12 deficiency 01/27/2016  . Barrett's esophagus   . Breast cancer ([place])   . Breast cancer, left breast ([place])   . Bronchitis 07/27/2011  . GERD (gastroesophageal reflux disease)   . Hypertension   . Osteopenia 10/09/2014    Past Surgical History Past Surgical History:  Procedure Laterality Date  . BREAST BIOPSY Bilateral    X 4- benign  . ESOPHAGOGASTRODUODENOSCOPY  08/03/2011   Procedure: ESOPHAGOGASTRODUODENOSCOPY (EGD);  Surgeon: Rogene Houston, MD;  Location: [facility] ENDO SU[facility];  Service: Endoscopy;  Laterality: N/A;  12:45  . EXCISION MORTON'S NEUROMA  07/14/2011   Procedure: EXCISION MORTON'S NEUROMA;  Surgeon: Marcheta Grammes;  Location: [facility]  ORS;  Service: Orthopedics;  Laterality: Right;  Removal Neuroma 3rd Interspace Right Foot  . MASTECTOMY Left   . MASTECTOMY MODIFIED RADICAL Left 07/17/2013   Procedure: MASTECTOMY MODIFIED RADICAL WITH LEFT AXILLARY TISSUE REMOVAL;  Surgeon: Scherry Ran, MD;  Location: [facility] ORS;  Service: General;  Laterality: Left;  . PARTIAL MASTECTOMY WITH NEEDLE LOCALIZATION Left 07/03/2013   Procedure: PARTIAL MASTECTOMY STATUS POST NEEDLE LOCALIZATION;  Surgeon: Scherry Ran, MD;  Location: [facility] ORS;  Service: General;  Laterality: Left;  . PORT-A-CATH REMOVAL Right 04/27/2015   Procedure: REMOVAL PORT-A-CATH;  Surgeon: Aviva Signs, MD;  Location: [facility] ORS;  Service: General;  Laterality: Right;  . PORTACATH PLACEMENT Right 08/16/2013   Procedure: INSERTION PORT-A-CATH RIGHT SUBCLAVIAN;  Surgeon: Scherry Ran, MD;  Location: [facility] ORS;  Service: General;  Laterality: Right;    Family History Family History  Problem Relation Age of Onset  . Anesthesia problems Neg Hx   . Hypotension Neg Hx   . Malignant hyperthermia Neg Hx   . Pseudochol deficiency Neg Hx      Social History  reports that she quit smoking about 24 years ago. Her smoking use included cigarettes. She has a 20.00 pack-year smoking history. She has never used smokeless tobacco. She reports that she does not drink alcohol or use drugs.  Medications  Current Outpatient Medications:  .  calcium-vitamin D (OSCAL WITH D) 500-200 MG-UNIT tablet, Take 2 tablets by mouth daily with breakfast., Disp: 60 tablet, Rfl: 6 .  DULoxetine (CYMBALTA) 30 MG capsule, Take 30 mg by mouth daily., Disp: , Rfl:  .  exemestane (AROMASIN) 25 MG tablet, Take 1 tablet (25 mg total) by mouth daily., Disp: 90 tablet, Rfl: 1 .  losartan-hydrochlorothiazide (HYZAAR) 100-25 MG per tablet, Take 1 tablet by mouth daily.  , Disp: , Rfl:  .  Misc. Devices MISC, Please provide patient with # 6 mastectomy bras. DX: Metastatic left breast cancer with history of  left mastectomy. (C50.912, C77.3), Disp: 6 each, Rfl: 0 .  omeprazole (PRILOSEC) 20 MG capsule, Take 20 mg by mouth 2 (two) times daily. , Disp: , Rfl:  .  traZODone (DESYREL) 50 MG tablet, Take 25 mg by mouth at bedtime as needed for sleep. For sleep, Disp: , Rfl:   Allergies Patient has no known allergies.  Review of Systems Review of Systems - Oncology ROS as per HPI otherwise 12 point ROS is negative.   Physical Exam  Vitals Wt Readings from Last 3 Encounters:  12/25/17 148 lb 1.6 oz (67.2 kg)  06/26/17 145 lb 6.4 oz (66 kg)  06/09/17 145 lb 9.6 oz (66 kg)   Temp  Readings from Last 3 Encounters:  06/26/17 99 F (37.2 C) (Oral)  12/08/16 98.4 F (36.9 C) (Oral)  06/09/16 98.4 F (36.9 C) (Oral)   BP Readings from Last 3 Encounters:  12/25/17 (!) 147/87  06/26/17 124/69  06/09/17 (!) 155/82   Pulse Readings from Last 3 Encounters:  12/25/17 79  06/26/17 80  06/09/17 74   Constitutional: Well-developed, well-nourished, and in no distress.   HENT: Head: Normocephalic and atraumatic.  Mouth/Throat: No oropharyngeal exudate. Mucosa moist. Eyes: Pupils are equal, round, and reactive to light. Conjunctivae are normal. No scleral icterus.  Neck: Normal range of motion. Neck supple. No JVD present.  Cardiovascular: Normal rate, regular rhythm and normal heart sounds.  Exam reveals no gallop and no friction rub.   No murmur heard. Pulmonary/Chest: Effort normal and breath sounds normal. No respiratory distress. No wheezes.No rales.  Abdominal: Soft. Bowel sounds are normal. No distension. There is no tenderness. There is no guarding.  Musculoskeletal: No edema or tenderness.  Lymphadenopathy: No cervical, axillary or supraclavicular adenopathy.  Neurological: Alert and oriented to person, place, and time. No cranial nerve deficit.  Skin: Skin is warm and dry. No rash noted. No erythema. No pallor.  Psychiatric: Affect and judgment normal.  Bilateral breast exam: Chaperone  present.  Left mastectomy healed well with no signs of chest wall recurrence.  Right breast shows no dominant masses palpable.  Labs No visits with results within 3 Day(s) from this visit.  Latest known visit with results is:  Appointment on 12/22/2017  Component Date Value Ref Range Status  . WBC 12/22/2017 4.2  4.0 - 10.5 K/uL Final  . RBC 12/22/2017 3.75* 3.87 - 5.11 MIL/uL Final  . Hemoglobin 12/22/2017 12.1  12.0 - 15.0 g/dL Final  . HCT 12/22/2017 37.1  36.0 - 46.0 % Final  . MCV 12/22/2017 98.9  78.0 - 100.0 fL Final  . MCH 12/22/2017 32.3  26.0 - 34.0 pg Final  . MCHC 12/22/2017 32.6  30.0 - 36.0 g/dL Final  . RDW 12/22/2017 12.8  11.5 - 15.5 % Final  . Platelets 12/22/2017 156  150 - 400 K/uL Final  . Neutrophils Relative % 12/22/2017 55  % Final  . Neutro Abs 12/22/2017 2.3  1.7 - 7.7 K/uL Final  . Lymphocytes Relative 12/22/2017 32  % Final  . Lymphs Abs 12/22/2017 1.3  0.7 - 4.0 K/uL Final  . Monocytes Relative 12/22/2017 5  % Final  . Monocytes Absolute 12/22/2017 0.2  0.1 - 1.0 K/uL Final  . Eosinophils Relative 12/22/2017 7  % Final  . Eosinophils Absolute 12/22/2017 0.3  0.0 - 0.7 K/uL Final  . Basophils Relative 12/22/2017 1  % Final  . Basophils Absolute 12/22/2017 0.0  0.0 - 0.1 K/uL Final   Performed at [facility], [address], [place], [place] [address]  . Sodium 12/22/2017 139  135 - 145 mmol/L Final  . Potassium 12/22/2017 3.3* 3.5 - 5.1 mmol/L Final  . Chloride 12/22/2017 101  101 - 111 mmol/L Final  . CO2 12/22/2017 25  22 - 32 mmol/L Final  . Glucose, Bld 12/22/2017 154* 65 - 99 mg/dL Final  . BUN 12/22/2017 18  6 - 20 mg/dL Final  . Creatinine, Ser 12/22/2017 0.79  0.44 - 1.00 mg/dL Final  . Calcium 12/22/2017 9.5  8.9 - 10.3 mg/dL Final  . Total Protein 12/22/2017 6.8  6.5 - 8.1 g/dL Final  . Albumin 12/22/2017 4.0  3.5 - 5.0 g/dL Final  .  AST 12/22/2017 21  15 - 41 U/L Final  . ALT 12/22/2017 20  14 - 54 U/L Final  . Alkaline Phosphatase  12/22/2017 97  38 - 126 U/L Final  . Total Bilirubin 12/22/2017 0.8  0.3 - 1.2 mg/dL Final  . GFR calc non Af Amer 12/22/2017 >60  >60 mL/min Final  . GFR calc Af Amer 12/22/2017 >60  >60 mL/min Final   Comment: (NOTE) The eGFR has been calculated using the CKD EPI equation. This calculation has not been validated in all clinical situations. eGFR's persistently <60 mL/min signify possible Chronic Kidney Disease.   Georgiann Hahn gap 12/22/2017 13  5 - 15 Final   Performed at [facility], [address], [place], [place] [address]  . LDH 12/22/2017 160  98 - 192 U/L Final   Performed at [facility], [address], [place], [place] [address]     Pathology Orders Placed This Encounter  Procedures  . MM Digital Screening Unilat R    Standing Status:   Future    Standing Expiration Date:   12/25/2018    Order Specific Question:   Reason for Exam (SYMPTOM  OR DIAGNOSIS REQUIRED)    Answer:   left breast cancer    Order Specific Question:   Preferred imaging location?    Answer:   [facility]       Zoila Shutter MD

## 2018-03-15 ENCOUNTER — Other Ambulatory Visit (HOSPITAL_COMMUNITY): Payer: Self-pay | Admitting: *Deleted

## 2018-03-15 DIAGNOSIS — C50912 Malignant neoplasm of unspecified site of left female breast: Secondary | ICD-10-CM

## 2018-03-15 DIAGNOSIS — C773 Secondary and unspecified malignant neoplasm of axilla and upper limb lymph nodes: Principal | ICD-10-CM

## 2018-03-15 MED ORDER — EXEMESTANE 25 MG PO TABS: 25.0000 mg | ORAL_TABLET | Freq: Every day | ORAL | 1 refills | Status: DC

## 2018-03-15 NOTE — Progress Notes (Signed)
Chart reviewed and per Dr. Walden Field last note, aromasin refilled at this time.

## 2018-03-16 MED FILL — EXEMESTANE 25 MG TABLET: 25 | 90 days supply | Qty: 90 | Fill #0

## 2018-07-20 ENCOUNTER — Ambulatory Visit (HOSPITAL_COMMUNITY)
Admission: RE | Admit: 2018-07-20 | Discharge: 2018-07-20 | Disposition: A | Discharge status: Home / Self Care | Payer: BLUE CROSS/BLUE SHIELD | Source: Ambulatory Visit | Attending: Internal Medicine | Admitting: Internal Medicine

## 2018-07-20 DIAGNOSIS — C773 Secondary and unspecified malignant neoplasm of axilla and upper limb lymph nodes: Secondary | ICD-10-CM | POA: Diagnosis present

## 2018-07-20 DIAGNOSIS — C50912 Malignant neoplasm of unspecified site of left female breast: Secondary | ICD-10-CM | POA: Insufficient documentation

## 2018-07-30 ENCOUNTER — Other Ambulatory Visit: Payer: Self-pay

## 2018-07-30 ENCOUNTER — Encounter (HOSPITAL_COMMUNITY): Payer: Self-pay | Admitting: Internal Medicine

## 2018-07-30 ENCOUNTER — Inpatient Hospital Stay (HOSPITAL_COMMUNITY): Payer: BLUE CROSS/BLUE SHIELD | Attending: Internal Medicine | Admitting: Internal Medicine

## 2018-07-30 VITALS — BP 116/65 | HR 74 | Temp 98.6°F | Resp 14 | Wt 123.3 lb

## 2018-07-30 DIAGNOSIS — Z9012 Acquired absence of left breast and nipple: Secondary | ICD-10-CM | POA: Diagnosis not present

## 2018-07-30 DIAGNOSIS — I10 Essential (primary) hypertension: Secondary | ICD-10-CM | POA: Diagnosis not present

## 2018-07-30 DIAGNOSIS — Z79811 Long term (current) use of aromatase inhibitors: Secondary | ICD-10-CM | POA: Diagnosis not present

## 2018-07-30 DIAGNOSIS — F419 Anxiety disorder, unspecified: Secondary | ICD-10-CM | POA: Diagnosis not present

## 2018-07-30 DIAGNOSIS — E538 Deficiency of other specified B group vitamins: Secondary | ICD-10-CM | POA: Diagnosis not present

## 2018-07-30 DIAGNOSIS — Z87891 Personal history of nicotine dependence: Secondary | ICD-10-CM | POA: Insufficient documentation

## 2018-07-30 DIAGNOSIS — I700 Atherosclerosis of aorta: Secondary | ICD-10-CM | POA: Diagnosis not present

## 2018-07-30 DIAGNOSIS — Z170 Estrogen receptor positive status [ER+]: Secondary | ICD-10-CM | POA: Diagnosis not present

## 2018-07-30 DIAGNOSIS — K219 Gastro-esophageal reflux disease without esophagitis: Secondary | ICD-10-CM | POA: Diagnosis not present

## 2018-07-30 DIAGNOSIS — C50912 Malignant neoplasm of unspecified site of left female breast: Secondary | ICD-10-CM

## 2018-07-30 DIAGNOSIS — Z9221 Personal history of antineoplastic chemotherapy: Secondary | ICD-10-CM | POA: Diagnosis not present

## 2018-07-30 DIAGNOSIS — C773 Secondary and unspecified malignant neoplasm of axilla and upper limb lymph nodes: Secondary | ICD-10-CM | POA: Diagnosis not present

## 2018-07-30 DIAGNOSIS — G629 Polyneuropathy, unspecified: Secondary | ICD-10-CM | POA: Diagnosis not present

## 2018-07-30 DIAGNOSIS — Z79899 Other long term (current) drug therapy: Secondary | ICD-10-CM | POA: Insufficient documentation

## 2018-07-30 DIAGNOSIS — M810 Age-related osteoporosis without current pathological fracture: Secondary | ICD-10-CM | POA: Diagnosis not present

## 2018-07-30 DIAGNOSIS — J439 Emphysema, unspecified: Secondary | ICD-10-CM | POA: Insufficient documentation

## 2018-07-30 DIAGNOSIS — Z923 Personal history of irradiation: Secondary | ICD-10-CM

## 2018-07-30 MED ORDER — EXEMESTANE 25 MG PO TABS: 25.0000 mg | ORAL_TABLET | Freq: Every day | ORAL | 4 refills | Status: DC

## 2018-07-30 NOTE — Progress Notes (Signed)
Diagnosis Carcinoma of left breast metastatic to axillary lymph node ([place]) - Plan: CBC with Differential/Platelet, Comprehensive metabolic panel, Lactate dehydrogenase, exemestane (AROMASIN) 25 MG tablet, CANCELED: DG Bone Survey Met  Age-related osteoporosis without current pathological fracture - Plan: CBC with Differential/Platelet, Comprehensive metabolic panel, Lactate dehydrogenase, DG Bone Density, CANCELED: DG Bone Survey Met  Staging Cancer Staging Invasive ductal carcinoma metastasized to axillary lymph node, left Staging form: Breast, AJCC 7th Edition - Clinical: Stage IIA (T1c, N1, cM0) - Signed by Baird Cancer, PA-C on 09/15/2013   Assessment and Plan:  1. 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 S/P left partial mastectomy on 07/03/2013 demonstrating a 1.4 cm invasive component that was ER 74%, PR 16%, Ki-67 52%, and HER2 negative. However, positive margins were noted and therefore on 07/17/2013 she underwent a left modified radical mastectomy and left axillary lymph node dissection. Clear margins were ascertained with 2/63 positive lymph nodes positive for metastatic disease. Testing on the positive lymph node reveals HER2 positivity. OncoType DX score was 29 placing her in the high-end of the intermediate risk group for recurrence. She was therefore started on [facility] therapy on 08/27/2013 and finished TC on 12/18/2013 after 6 cycles. She is S/P radiation therapy by Dr. Pablo Ledger on 01/27/2014- 03/14/2014. She is S/P completion of 52 weeks worth of Herceptin therapy.  Now on Aromasin (beginning tx on 03/29/2014).    She was followed by Dr. Talbert Cage.  She had CT chest done 06/21/2018 that showed:    IMPRESSION: 1. No evidence of metastatic disease in the thorax. Multiple previously noted tiny pulmonary nodules are stable compared to prior studies and considered benign. 2. Aortic atherosclerosis. 3. Mild diffuse bronchial wall thickening with mild centrilobular and  paraseptal emphysema most apparent the lung apices; imaging findings that may suggest underlying COPD. Aortic Atherosclerosis (ICD10-I70.0) and Emphysema (ICD10-J43.9). Electronically Signed   By: Vinnie Langton M.D.   On: 06/21/2017 09:30   Pt reports she has not smoked for 20 years.  Can consider repeat chest imaging in 05/2019 for ongoing follow-up.    Screening right breast mammogram done 07/20/2018 reviewed and showed no evidence of malignancy with right screening mammogram recommended in 06/2019.    Pt will be seen for follow-up in 01/2019 with labs.  Rx for Aromasin # 90 with 4 refills sent to pharmacy.   Pt recommended for at least 5 years of therapy with aromasin through 03/2019 to complete 5 years of endocrine therapy. Due to benefits of extended adjuvant therapy, I discussed continuing therapy for 10 years which would take her into 03/2024.    2.  Osteoporosis.  This was noted on DEXA on 11/2016 that demonstrated osteoporosis.  She was recommended for calcium and vitamin D.  Pt was previously on Prolia  for her osteopenia (10/09/2014-1//30/2017) however had to stop due to insurance costs. She was reportedly recommended for fosamax but pt is not taking medication.  Pt is set up for bone density in 11/2018.  She reports she will discuss with PCP other options for osteoporosis.    3.  Joint pain.  She reports some left scapular pain that comes and goes.  She was given the option of bone scan for further evaluation but previously did not desire to have that performed.  She is advised to notify the office if symptoms worsen and she will be set up for imaging.  4.  Health maintenance.  Continue GI evaluation as recommended.  5.  Hypertension.  Blood pressure  116/65. Follow with PCP  6.  Itching.  She reports this occurs in left leg.  Benadryl prn and notify office or PCP if no improvement.    25 minutes spent with more than 50% spent in counseling and coordination of care.    Interval History:   Historical data obtained from note dated 12/25/2017. 62 y.o. 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 S/P left partial mastectomy on 07/03/2013 demonstrating a 1.4 cm invasive component that was ER 74%, PR 16%, Ki-67 52%, and HER2 negative. However, positive margins were noted and therefore on 07/17/2013 she underwent a left modified radical mastectomy and left axillary lymph node dissection. Clear margins were ascertained with 7/79 positive lymph nodes positive for metastatic disease. Testing on the positive lymph node reveals HER2 positivity. OncoType DX score was 29 placing her in the high-end of the intermediate risk group for recurrence. She was therefore started on [facility] therapy on 08/27/2013 and finished TC on 12/18/2013 after 6 cycles. She is S/P radiation therapy by Dr. Pablo Ledger on 01/27/2014- 03/14/2014. She is S/P completion of 52 weeks worth of Herceptin therapy. Now on Aromasin (beginning on on 03/29/2014).   Current Status: Patient is seen today for follow-up to go over mammogram.  She needs refills on Aromasin.  She is wondering how long she should stay on therapy.       Invasive ductal carcinoma metastasized to axillary lymph node, left   05/28/2013 Imaging    Screening mammogram    06/19/2013 Imaging    Left diagnostic mammogram    06/25/2013 Imaging    CT CAP- borderline enlarged axillary lymph node on left.  Small pulmonary nodules noted but too small for PET or biopsy; surveillance recommended.     07/03/2013 Surgery    Left partial mastectomy.  Invasive ductal carcinoma. 1.4 cm.  POSITIVE MARGINS.  Additional extensive DCIS noted.  ER 74%, PR 16%, Ki-67 52%, HER2 NEGATIVE.    07/17/2013 Surgery    Left modified radiacl mastectomy with left axillary node dissection.  Negative residual cancer.  Negative inked margins.  1/12 positive lymph nodes.  FINAL MARGINS NEGATIVE.  LYMPH NODE IS HER2 POSITIVE.    08/13/2013 Imaging    MUGA  scan shows left ventricular EF of 62%    08/27/2013 - 12/18/2013 Chemotherapy    TCH x 6 cycles    01/07/2014 -  Chemotherapy    Herceptin x 52 weeks    01/27/2014 - 03/13/2014 Radiation Therapy    By Dr. Pablo Ledger    03/29/2014 -  Anti-estrogen oral therapy    Aromasin    10/24/2014 Imaging    Bone density- BMD as determined from Femur Neck Right is 0.698 g/cm2 with a T-Score of -2.4. This patient is considered osteopenic according to [facility]) criteria. (Lumbar spine was not utilized due to advanced degenerative changes).    11/05/2014 Imaging    MUGA- Left ventricular ejection fraction equals 70%. No change from prior.    04/27/2015 Procedure    Port-A-Cath removal by Dr. Arnoldo Morale    06/19/2015 Imaging    CT chest- Status post left mastectomy, without recurrent or metastatic disease. Decrease in size of right-sided pulmonary nodules, favoring an infectious or inflammatory etiology.    06/21/2017 Imaging    CT Chest w contrast: IMPRESSION: 1. No evidence of metastatic disease in the thorax. Multiple previously noted tiny pulmonary nodules are stable compared to prior studies and considered benign. 2. Aortic atherosclerosis.  3. Mild diffuse bronchial wall thickening with mild centrilobular and paraseptal emphysema most apparent the lung apices; imaging findings that may suggest underlying COPD.      Problem List Patient Active Problem List   Diagnosis Date Noted  . B12 deficiency [E53.8] 01/27/2016  . Osteopenia [M85.80] 10/09/2014  . Peripheral neuropathy due to chemotherapy ([place]) [G62.0, T45.1X5A] 05/08/2014  . Unstable balance [R26.89] 05/08/2014  . Stiffness of joint, not elsewhere classified, ankle and foot [M25.673, M25.676] 05/08/2014  . Ankle weakness [R29.898] 05/08/2014  . Invasive ductal carcinoma metastasized to axillary lymph node, left [C50.919, C77.3] 08/09/2013  . Barrett esophagus [K22.70] 12/22/2011  . GERD (gastroesophageal reflux  disease) [K21.9] 06/23/2011  . Hypertension [I10] 06/23/2011    Past Medical History Past Medical History:  Diagnosis Date  . Anxiety   . B12 deficiency 01/27/2016  . Barrett's esophagus   . Breast cancer ([place])   . Breast cancer, left breast ([place])   . Bronchitis 07/27/2011  . GERD (gastroesophageal reflux disease)   . Hypertension   . Osteopenia 10/09/2014    Past Surgical History Past Surgical History:  Procedure Laterality Date  . BREAST BIOPSY Bilateral    X 4- benign  . ESOPHAGOGASTRODUODENOSCOPY  08/03/2011   Procedure: ESOPHAGOGASTRODUODENOSCOPY (EGD);  Surgeon: Rogene Houston, MD;  Location: [facility];  Service: Endoscopy;  Laterality: N/A;  12:45  . EXCISION MORTON'S NEUROMA  07/14/2011   Procedure: EXCISION MORTON'S NEUROMA;  Surgeon: Marcheta Grammes;  Location: [facility] ORS;  Service: Orthopedics;  Laterality: Right;  Removal Neuroma 3rd Interspace Right Foot  . MASTECTOMY Left   . MASTECTOMY MODIFIED RADICAL Left 07/17/2013   Procedure: MASTECTOMY MODIFIED RADICAL WITH LEFT AXILLARY TISSUE REMOVAL;  Surgeon: Scherry Ran, MD;  Location: [facility] ORS;  Service: General;  Laterality: Left;  . PARTIAL MASTECTOMY WITH NEEDLE LOCALIZATION Left 07/03/2013   Procedure: PARTIAL MASTECTOMY STATUS POST NEEDLE LOCALIZATION;  Surgeon: Scherry Ran, MD;  Location: [facility] ORS;  Service: General;  Laterality: Left;  . PORT-A-CATH REMOVAL Right 04/27/2015   Procedure: REMOVAL PORT-A-CATH;  Surgeon: Aviva Signs, MD;  Location: [facility] ORS;  Service: General;  Laterality: Right;  . PORTACATH PLACEMENT Right 08/16/2013   Procedure: INSERTION PORT-A-CATH RIGHT SUBCLAVIAN;  Surgeon: Scherry Ran, MD;  Location: [facility] OR[facility];  Service: General;  Laterality: Right;    Family History Family History  Problem Relation Age of Onset  . Anesthesia problems Neg Hx   . Hypotension Neg Hx   . Malignant hyperthermia Neg Hx   . Pseudochol deficiency Neg Hx      Social History  reports that  she quit smoking about 24 years ago. Her smoking use included cigarettes. She has a 20.00 pack-year smoking history. She has never used smokeless tobacco. She reports that she does not drink alcohol or use drugs.  Medications  Current Outpatient Medications:  .  DULoxetine (CYMBALTA) 30 MG capsule, Take 30 mg by mouth daily., Disp: , Rfl:  .  exemestane (AROMASIN) 25 MG tablet, Take 1 tablet (25 mg total) by mouth daily., Disp: 90 tablet, Rfl: 4 .  losartan-hydrochlorothiazide (HYZAAR) 100-25 MG per tablet, Take 1 tablet by mouth daily.  , Disp: , Rfl:  .  omeprazole (PRILOSEC) 20 MG capsule, Take 20 mg by mouth 2 (two) times daily. , Disp: , Rfl:  .  traZODone (DESYREL) 50 MG tablet, Take 25 mg by mouth at bedtime as needed for sleep. For sleep, Disp: , Rfl:   Allergies Patient has no known allergies.  Review of Systems Review of Systems - Oncology ROS negative other than left leg itching.     Physical Exam  Vitals Wt Readings from Last 3 Encounters:  07/30/18 123 lb 4.8 oz (55.9 kg)  12/25/17 148 lb 1.6 oz (67.2 kg)  06/26/17 145 lb 6.4 oz (66 kg)   Temp Readings from Last 3 Encounters:  07/30/18 98.6 F (37 C) (Oral)  06/26/17 99 F (37.2 C) (Oral)  12/08/16 98.4 F (36.9 C) (Oral)   BP Readings from Last 3 Encounters:  07/30/18 116/65  12/25/17 (!) 147/87  06/26/17 124/69   Pulse Readings from Last 3 Encounters:  07/30/18 74  12/25/17 79  06/26/17 80   Constitutional: Well-developed, well-nourished, and in no distress.   HENT: Head: Normocephalic and atraumatic.  Mouth/Throat: No oropharyngeal exudate. Mucosa moist. Eyes: Pupils are equal, round, and reactive to light. Conjunctivae are normal. No scleral icterus.  Neck: Normal range of motion. Neck supple. No JVD present.  Cardiovascular: Normal rate, regular rhythm and normal heart sounds.  Exam reveals no gallop and no friction rub.   No murmur heard. Pulmonary/Chest: Effort normal and breath sounds normal.  No respiratory distress. No wheezes.No rales.  Abdominal: Soft. Bowel sounds are normal. No distension. There is no tenderness. There is no guarding.  Musculoskeletal: No edema or tenderness.  Lymphadenopathy: No cervical, axillary or supraclavicular adenopathy.  Neurological: Alert and oriented to person, place, and time. No cranial nerve deficit.  Skin: Skin is warm and dry. No rash noted. No erythema. No pallor.  Psychiatric: Affect and judgment normal.  Breast exam:  Chaperone present.  Left mastectomy.  No palpable chest wall lesions noted.  .  Right breast shows no dominant masses.    Labs No visits with results within 3 Day(s) from this visit.  Latest known visit with results is:  Appointment on 12/22/2017  Component Date Value Ref Range Status  . WBC 12/22/2017 4.2  4.0 - 10.5 K/uL Final  . RBC 12/22/2017 3.75* 3.87 - 5.11 MIL/uL Final  . Hemoglobin 12/22/2017 12.1  12.0 - 15.0 g/dL Final  . HCT 12/22/2017 37.1  36.0 - 46.0 % Final  . MCV 12/22/2017 98.9  78.0 - 100.0 fL Final  . MCH 12/22/2017 32.3  26.0 - 34.0 pg Final  . MCHC 12/22/2017 32.6  30.0 - 36.0 g/dL Final  . RDW 12/22/2017 12.8  11.5 - 15.5 % Final  . Platelets 12/22/2017 156  150 - 400 K/uL Final  . Neutrophils Relative % 12/22/2017 55  % Final  . Neutro Abs 12/22/2017 2.3  1.7 - 7.7 K/uL Final  . Lymphocytes Relative 12/22/2017 32  % Final  . Lymphs Abs 12/22/2017 1.3  0.7 - 4.0 K/uL Final  . Monocytes Relative 12/22/2017 5  % Final  . Monocytes Absolute 12/22/2017 0.2  0.1 - 1.0 K/uL Final  . Eosinophils Relative 12/22/2017 7  % Final  . Eosinophils Absolute 12/22/2017 0.3  0.0 - 0.7 K/uL Final  . Basophils Relative 12/22/2017 1  % Final  . Basophils Absolute 12/22/2017 0.0  0.0 - 0.1 K/uL Final   Performed at [facility], [address] [place], [place] [address]  . Sodium 12/22/2017 139  135 - 145 mmol/L Final  . Potassium 12/22/2017 3.3* 3.5 - 5.1 mmol/L Final  . Chloride 12/22/2017 101  101 - 111  mmol/L Final  . CO2 12/22/2017 25  22 - 32 mmol/L Final  . Glucose, Bld 12/22/2017 154* 65 - 99 mg/dL Final  .  BUN 12/22/2017 18  6 - 20 mg/dL Final  . Creatinine, Ser 12/22/2017 0.79  0.44 - 1.00 mg/dL Final  . Calcium 12/22/2017 9.5  8.9 - 10.3 mg/dL Final  . Total Protein 12/22/2017 6.8  6.5 - 8.1 g/dL Final  . Albumin 12/22/2017 4.0  3.5 - 5.0 g/dL Final  . AST 12/22/2017 21  15 - 41 U/L Final  . ALT 12/22/2017 20  14 - 54 U/L Final  . Alkaline Phosphatase 12/22/2017 97  38 - 126 U/L Final  . Total Bilirubin 12/22/2017 0.8  0.3 - 1.2 mg/dL Final  . GFR calc non Af Amer 12/22/2017 >60  >60 mL/min Final  . GFR calc Af Amer 12/22/2017 >60  >60 mL/min Final   Comment: (NOTE) The eGFR has been calculated using the CKD EPI equation. This calculation has not been validated in all clinical situations. eGFR's persistently <60 mL/min signify possible Chronic Kidney Disease.   Georgiann Hahn gap 12/22/2017 13  5 - 15 Final   Performed at [facility], [address] [place], [place] [address]  . LDH 12/22/2017 160  98 - 192 U/L Final   Performed at [facility], [address], [place], [place] [address]     Pathology Orders Placed This Encounter  Procedures  . DG Bone Density    Standing Status:   Future    Standing Expiration Date:   07/30/2019    Order Specific Question:   Reason for Exam (SYMPTOM  OR DIAGNOSIS REQUIRED)    Answer:   osteoporosis, postmenopausal    Order Specific Question:   Preferred imaging location?    Answer:   [facility]  . CBC with Differential/Platelet    Standing Status:   Future    Standing Expiration Date:   07/30/2020  . Comprehensive metabolic panel    Standing Status:   Future    Standing Expiration Date:   07/30/2020  . Lactate dehydrogenase    Standing Status:   Future    Standing Expiration Date:   07/30/2020       Zoila Shutter MD

## 2018-07-30 NOTE — Patient Instructions (Signed)
[facility] at [facility]  Discharge Instructions: You saw Dr. Higgs today                               _______________________________________________________________  Thank you for choosing [facility] at [facility] to provide your oncology and hematology care.  To afford each patient quality time with our providers, please arrive at least 15 minutes before your scheduled appointment.  You need to re-schedule your appointment if you arrive 10 or more minutes late.  We strive to give you quality time with our providers, and arriving late affects you and other patients whose appointments are after yours.  Also, if you no show three or more times for appointments you may be dismissed from the clinic.  Again, thank you for choosing [facility]. Our hope is that these requests will allow you access to exceptional care and in a timely manner. _______________________________________________________________  If you have questions after your visit, please contact our office at [phone] between the hours of 8:30 a.m. and 5:00 p.m. Voicemails left after 4:30 p.m. will not be returned until the following business day. _______________________________________________________________  For prescription refill requests, have your pharmacy contact our office. _______________________________________________________________  Recommendations made by the consultant and any test results will be sent to your referring physician. _______________________________________________________________ 

## 2018-09-12 ENCOUNTER — Telehealth (HOSPITAL_COMMUNITY): Payer: Self-pay | Admitting: *Deleted

## 2018-12-15 ENCOUNTER — Other Ambulatory Visit (HOSPITAL_COMMUNITY): Payer: Self-pay | Admitting: *Deleted

## 2018-12-15 DIAGNOSIS — C773 Secondary and unspecified malignant neoplasm of axilla and upper limb lymph nodes: Principal | ICD-10-CM

## 2018-12-15 DIAGNOSIS — C50912 Malignant neoplasm of unspecified site of left female breast: Secondary | ICD-10-CM

## 2018-12-15 MED ORDER — ANASTROZOLE 1 MG PO TABS: 1.0000 mg | ORAL_TABLET | Freq: Every day | ORAL | 2 refills | Status: DC

## 2018-12-24 ENCOUNTER — Inpatient Hospital Stay (HOSPITAL_COMMUNITY): Admission: RE | Admit: 2018-12-24 | Payer: BLUE CROSS/BLUE SHIELD | Source: Ambulatory Visit

## 2019-01-09 ENCOUNTER — Ambulatory Visit (HOSPITAL_COMMUNITY)
Admission: RE | Admit: 2019-01-09 | Discharge: 2019-01-09 | Disposition: A | Discharge status: Home / Self Care | Payer: PRIVATE HEALTH INSURANCE | Source: Ambulatory Visit | Attending: Internal Medicine | Admitting: Internal Medicine

## 2019-01-09 ENCOUNTER — Other Ambulatory Visit: Payer: Self-pay

## 2019-01-09 DIAGNOSIS — M810 Age-related osteoporosis without current pathological fracture: Secondary | ICD-10-CM | POA: Insufficient documentation

## 2019-01-23 ENCOUNTER — Inpatient Hospital Stay (HOSPITAL_COMMUNITY): Payer: PRIVATE HEALTH INSURANCE | Attending: Hematology

## 2019-01-23 ENCOUNTER — Other Ambulatory Visit: Payer: Self-pay

## 2019-01-23 DIAGNOSIS — M810 Age-related osteoporosis without current pathological fracture: Secondary | ICD-10-CM

## 2019-01-23 DIAGNOSIS — C773 Secondary and unspecified malignant neoplasm of axilla and upper limb lymph nodes: Secondary | ICD-10-CM | POA: Diagnosis not present

## 2019-01-23 DIAGNOSIS — C50912 Malignant neoplasm of unspecified site of left female breast: Secondary | ICD-10-CM | POA: Diagnosis not present

## 2019-01-23 LAB — COMPREHENSIVE METABOLIC PANEL
ALT: 23 U/L (ref 0–44)
AST: 20 U/L (ref 15–41)
Albumin: 4.4 g/dL (ref 3.5–5.0)
Alkaline Phosphatase: 89 U/L (ref 38–126)
Anion gap: 10 (ref 5–15)
BUN: 19 mg/dL (ref 8–23)
CO2: 27 mmol/L (ref 22–32)
Calcium: 9.3 mg/dL (ref 8.9–10.3)
Chloride: 102 mmol/L (ref 98–111)
Creatinine, Ser: 0.75 mg/dL (ref 0.44–1.00)
GFR calc Af Amer: >60 mL/min (ref >60)
GFR calc non Af Amer: >60 mL/min (ref >60)
Glucose, Bld: 108 mg/dL — ABNORMAL HIGH (ref 70–99)
Potassium: 3.8 mmol/L (ref 3.5–5.1)
Sodium: 139 mmol/L (ref 135–145)
Total Bilirubin: 0.5 mg/dL (ref 0.3–1.2)
Total Protein: 7 g/dL (ref 6.5–8.1)

## 2019-01-23 LAB — CBC WITH DIFFERENTIAL/PLATELET
Abs Immature Granulocytes: 0.01 10*3/uL (ref 0.00–0.07)
Basophils Absolute: 0 10*3/uL (ref 0.0–0.1)
Basophils Relative: 1 %
Eosinophils Absolute: 0.2 10*3/uL (ref 0.0–0.5)
Eosinophils Relative: 3 %
HCT: 39.4 % (ref 36.0–46.0)
Hemoglobin: 12.9 g/dL (ref 12.0–15.0)
Immature Granulocytes: 0 %
Lymphocytes Relative: 30 %
Lymphs Abs: 1.7 10*3/uL (ref 0.7–4.0)
MCH: 34.1 pg — ABNORMAL HIGH (ref 26.0–34.0)
MCHC: 32.7 g/dL (ref 30.0–36.0)
MCV: 104.2 fL — ABNORMAL HIGH (ref 80.0–100.0)
Monocytes Absolute: 0.5 10*3/uL (ref 0.1–1.0)
Monocytes Relative: 8 %
Neutro Abs: 3.3 10*3/uL (ref 1.7–7.7)
Neutrophils Relative %: 58 %
Platelets: 190 10*3/uL (ref 150–400)
RBC: 3.78 MIL/uL — ABNORMAL LOW (ref 3.87–5.11)
RDW: 12.3 % (ref 11.5–15.5)
WBC: 5.6 10*3/uL (ref 4.0–10.5)
nRBC: 0 % (ref 0.0–0.2)

## 2019-01-23 LAB — LACTATE DEHYDROGENASE: LDH: 144 U/L (ref 98–192)

## 2019-01-23 NOTE — Progress Notes (Signed)
For review.  Please update ordering provider

## 2019-01-30 ENCOUNTER — Encounter (HOSPITAL_COMMUNITY): Payer: Self-pay | Admitting: Hematology

## 2019-01-30 ENCOUNTER — Inpatient Hospital Stay (HOSPITAL_COMMUNITY): Payer: PRIVATE HEALTH INSURANCE | Attending: Hematology | Admitting: Hematology

## 2019-01-30 ENCOUNTER — Other Ambulatory Visit: Payer: Self-pay

## 2019-01-30 VITALS — BP 134/81 | HR 65 | Temp 98.1°F | Resp 18 | Wt 130.0 lb

## 2019-01-30 DIAGNOSIS — C50912 Malignant neoplasm of unspecified site of left female breast: Secondary | ICD-10-CM | POA: Insufficient documentation

## 2019-01-30 DIAGNOSIS — Z1239 Encounter for other screening for malignant neoplasm of breast: Secondary | ICD-10-CM

## 2019-01-30 DIAGNOSIS — Z170 Estrogen receptor positive status [ER+]: Secondary | ICD-10-CM | POA: Diagnosis not present

## 2019-01-30 DIAGNOSIS — M810 Age-related osteoporosis without current pathological fracture: Secondary | ICD-10-CM

## 2019-01-30 DIAGNOSIS — Z79811 Long term (current) use of aromatase inhibitors: Secondary | ICD-10-CM | POA: Diagnosis not present

## 2019-01-30 DIAGNOSIS — Z9013 Acquired absence of bilateral breasts and nipples: Secondary | ICD-10-CM | POA: Diagnosis not present

## 2019-01-30 DIAGNOSIS — C773 Secondary and unspecified malignant neoplasm of axilla and upper limb lymph nodes: Secondary | ICD-10-CM | POA: Diagnosis not present

## 2019-01-30 DIAGNOSIS — Z9221 Personal history of antineoplastic chemotherapy: Secondary | ICD-10-CM | POA: Insufficient documentation

## 2019-01-30 NOTE — Assessment & Plan Note (Signed)
1.  Left breast cancer: -Right modified radical mastectomy on 07/17/2013, 1/12 lymph nodes positive, grade 2, 1.4 cm invasive tumor, margins negative, ER 74% positive, PR 16% positive, HER-2 negative, Ki-67 52%, PT1CPN1A.  HER-2/neu was positive on testing by [facility] on lymph node. - 6 cycles of [place] from 08/27/2013 through 12/18/2013, 1 year Herceptin completed on 08/28/2014. - Status post XRT completed in July 2015. - Anastrozole started in August 2015. -She is tolerating it very well.  Today physical examination did not reveal any palpable masses in the left mastectomy site.  Right breast has no palpable masses. - Plan was to continue anastrozole beyond 5 years, up to 10 years. -Last right breast mammogram was on 07/20/2018 which was BI-RADS 1.  We will schedule her for another mammogram before her next visit.  2.  Osteoporosis: - I reviewed the results of the DEXA scan from 01/09/2019 which shows T score of -2.6.  This is slightly worse from the prior scan of -2.5 and -2.4 prior to that. -She received temporarily Prolia on 10/09/2014, 04/09/2015 and 09/28/2015.  This was discontinued secondary to high insurance cost. -She has also taken Fosamax, but not sure why she discontinued it. - We talked about restarting her on Fosamax.  We talked about side effects in detail.  She does not have any teeth in the upper area.  She does have her own teeth in the lower jaw.  She does not report any problems with it. - I talked to her in detail about side effects and how to take Fosamax.  She was also counseled to take calcium and vitamin D supplements twice daily. - I plan to repeat a vitamin D level prior to next visit in 6 months.

## 2019-01-30 NOTE — Patient Instructions (Addendum)
[place] at [facility] Discharge Instructions  You were seen today by Dr. Delton Coombes. He went over your recent lab results. He will see you back in 6 months for labs and follow up.  He would like you to start taking calcium and vit D 2 times daily.   Thank you for choosing [place] at [facility] to provide your oncology and hematology care.  To afford each patient quality time with our provider, please arrive at least 15 minutes before your scheduled appointment time.   If you have a lab appointment with the [facility] please come in thru the  Main Entrance and check in at the main information desk  You need to re-schedule your appointment should you arrive 10 or more minutes late.  We strive to give you quality time with our providers, and arriving late affects you and other patients whose appointments are after yours.  Also, if you no show three or more times for appointments you may be dismissed from the clinic at the providers discretion.     Again, thank you for choosing [facility].  Our hope is that these requests will decrease the amount of time that you wait before being seen by our physicians.       _____________________________________________________________  Should you have questions after your visit to [facility], please contact our office at [phone] between the hours of 8:00 a.m. and 4:30 p.m.  Voicemails left after 4:00 p.m. will not be returned until the following business day.  For prescription refill requests, have your pharmacy contact our office and allow 72 hours.    Cancer Center Support Programs:   > Cancer Support Group  2nd Tuesday of the month 1pm-2pm, Journey Room

## 2019-01-30 NOTE — Progress Notes (Signed)
[place], [place] [address]   CLINIC:  Medical Oncology/Hematology  PCP:  Scherrie Bateman [address] [place] [address] [phone]   REASON FOR VISIT:  Follow-up for metastatic breast cancer   BRIEF ONCOLOGIC HISTORY:    Invasive ductal carcinoma metastasized to axillary lymph node, left   05/28/2013 Imaging    Screening mammogram    06/19/2013 Imaging    Left diagnostic mammogram    06/25/2013 Imaging    CT CAP- borderline enlarged axillary lymph node on left.  Small pulmonary nodules noted but too small for PET or biopsy; surveillance recommended.     07/03/2013 Surgery    Left partial mastectomy.  Invasive ductal carcinoma. 1.4 cm.  POSITIVE MARGINS.  Additional extensive DCIS noted.  ER 74%, PR 16%, Ki-67 52%, HER2 NEGATIVE.    07/17/2013 Surgery    Left modified radiacl mastectomy with left axillary node dissection.  Negative residual cancer.  Negative inked margins.  1/12 positive lymph nodes.  FINAL MARGINS NEGATIVE.  LYMPH NODE IS HER2 POSITIVE.    08/13/2013 Imaging    MUGA scan shows left ventricular EF of 62%    08/27/2013 - 12/18/2013 Chemotherapy    TCH x 6 cycles    01/07/2014 -  Chemotherapy    Herceptin x 52 weeks    01/27/2014 - 03/13/2014 Radiation Therapy    By Dr. Pablo Ledger    03/29/2014 -  Anti-estrogen oral therapy    Aromasin    10/24/2014 Imaging    Bone density- BMD as determined from Femur Neck Right is 0.698 g/cm2 with a T-Score of -2.4. This patient is considered osteopenic according to [facility]) criteria. (Lumbar spine was not utilized due to advanced degenerative changes).    11/05/2014 Imaging    MUGA- Left ventricular ejection fraction equals 70%. No change from prior.    04/27/2015 Procedure    Port-A-Cath removal by Dr. Arnoldo Morale    06/19/2015 Imaging    CT chest- Status post left mastectomy, without recurrent or metastatic disease. Decrease in size of  right-sided pulmonary nodules, favoring an infectious or inflammatory etiology.    06/21/2017 Imaging    CT Chest w contrast: IMPRESSION: 1. No evidence of metastatic disease in the thorax. Multiple previously noted tiny pulmonary nodules are stable compared to prior studies and considered benign. 2. Aortic atherosclerosis. 3. Mild diffuse bronchial wall thickening with mild centrilobular and paraseptal emphysema most apparent the lung apices; imaging findings that may suggest underlying COPD.      CANCER STAGING: Cancer Staging Invasive ductal carcinoma metastasized to axillary lymph node, left Staging form: Breast, AJCC 7th Edition - Clinical: Stage IIA (T1c, N1, cM0) - Signed by Baird Cancer, PA-C on 09/15/2013    INTERVAL HISTORY:  Ms. Torain 63 y.o. female returns for routine follow-up. She is here today alone. She states that she continues taking the anastrazole daily with no missed doses. Denies any nausea, vomiting, or diarrhea. Denies any new pains. Had not noticed any recent bleeding such as epistaxis, hematuria or hematochezia. Denies recent chest pain on exertion, shortness of breath on minimal exertion, pre-syncopal episodes, or palpitations. Denies any numbness or tingling in hands or feet. Denies any recent fevers, infections, or recent hospitalizations. Patient reports appetite at 100% and energy level at 100%.    REVIEW OF SYSTEMS:  Review of Systems  All other systems reviewed and are negative.    PAST MEDICAL/SURGICAL HISTORY:  Past Medical History:  Diagnosis Date  . Anxiety   . B12 deficiency 01/27/2016  . Barrett's esophagus   . Breast cancer ([place])   . Breast cancer, left breast ([place])   . Bronchitis 07/27/2011  . GERD (gastroesophageal reflux disease)   . Hypertension   . Osteopenia 10/09/2014   Past Surgical History:  Procedure Laterality Date  . BREAST BIOPSY Bilateral    X 4- benign  . ESOPHAGOGASTRODUODENOSCOPY  08/03/2011   Procedure:  ESOPHAGOGASTRODUODENOSCOPY (EGD);  Surgeon: Rogene Houston, MD;  Location: [facility];  Service: Endoscopy;  Laterality: N/A;  12:45  . EXCISION MORTON'S NEUROMA  07/14/2011   Procedure: EXCISION MORTON'S NEUROMA;  Surgeon: Marcheta Grammes;  Location: [facility] ORS;  Service: Orthopedics;  Laterality: Right;  Removal Neuroma 3rd Interspace Right Foot  . MASTECTOMY Left   . MASTECTOMY MODIFIED RADICAL Left 07/17/2013   Procedure: MASTECTOMY MODIFIED RADICAL WITH LEFT AXILLARY TISSUE REMOVAL;  Surgeon: Scherry Ran, MD;  Location: [facility] ORS;  Service: General;  Laterality: Left;  . PARTIAL MASTECTOMY WITH NEEDLE LOCALIZATION Left 07/03/2013   Procedure: PARTIAL MASTECTOMY STATUS POST NEEDLE LOCALIZATION;  Surgeon: Scherry Ran, MD;  Location: [facility] ORS;  Service: General;  Laterality: Left;  . PORT-A-CATH REMOVAL Right 04/27/2015   Procedure: REMOVAL PORT-A-CATH;  Surgeon: Aviva Signs, MD;  Location: [facility] OR[facility];  Service: General;  Laterality: Right;  . PORTACATH PLACEMENT Right 08/16/2013   Procedure: INSERTION PORT-A-CATH RIGHT SUBCLAVIAN;  Surgeon: Scherry Ran, MD;  Location: [facility] OR[facility];  Service: General;  Laterality: Right;     SOCIAL HISTORY:  Social History   Socioeconomic History  . Marital status: Married    Spouse name: Not on file  . Number of children: Not on file  . Years of education: Not on file  . Highest education level: Not on file  Occupational History  . Not on file  Social Needs  . Financial resource strain: Not on file  . Food insecurity:    Worry: Not on file    Inability: Not on file  . Transportation needs:    Medical: Not on file    Non-medical: Not on file  Tobacco Use  . Smoking status: Former Smoker    Packs/day: 1.00    Years: 20.00    Pack years: 20.00    Types: Cigarettes    Last attempt to quit: 09/05/1993    Years since quitting: 25.4  . Smokeless tobacco: Never Used  Substance and Sexual Activity  . Alcohol use: No    Comment:  occasionally  . Drug use: No  . Sexual activity: Not on file  Lifestyle  . Physical activity:    Days per week: Not on file    Minutes per session: Not on file  . Stress: Not on file  Relationships  . Social connections:    Talks on phone: Not on file    Gets together: Not on file    Attends religious service: Not on file    Active member of club or organization: Not on file    Attends meetings of clubs or organizations: Not on file    Relationship status: Not on file  . Intimate partner violence:    Fear of current or ex partner: Not on file    Emotionally abused: Not on file    Physically abused: Not on file    Forced sexual activity: Not on file  Other Topics Concern  . Not on file  Social History Narrative  . Not on file  FAMILY HISTORY:  Family History  Problem Relation Age of Onset  . Anesthesia problems Neg Hx   . Hypotension Neg Hx   . Malignant hyperthermia Neg Hx   . Pseudochol deficiency Neg Hx     CURRENT MEDICATIONS:  Outpatient Encounter Medications as of 01/30/2019  Medication Sig Note  . anastrozole (ARIMIDEX) 1 MG tablet Take 1 tablet (1 mg total) by mouth daily.   . DULoxetine (CYMBALTA) 30 MG capsule Take 30 mg by mouth daily.   . hydrochlorothiazide (HYDRODIURIL) 25 MG tablet    . losartan (COZAAR) 100 MG tablet    . losartan-hydrochlorothiazide (HYZAAR) 100-25 MG per tablet Take 1 tablet by mouth daily.     Marland Kitchen omeprazole (PRILOSEC) 20 MG capsule Take 20 mg by mouth 2 (two) times daily.  06/26/2015: Received from: External Pharmacy  . traZODone (DESYREL) 50 MG tablet Take 25 mg by mouth at bedtime as needed for sleep. For sleep 07/16/2013: ---   No facility-administered encounter medications on file as of 01/30/2019.     ALLERGIES:  No Known Allergies   PHYSICAL EXAM:  ECOG Performance status: 1  Vitals:   01/30/19 1455  BP: 134/81  Pulse: 65  Resp: 18  Temp: 98.1 F (36.7 C)  SpO2: 100%   Filed Weights   01/30/19 1455  Weight: 130  lb (59 kg)    Physical Exam Vitals signs reviewed.  Constitutional:      Appearance: Normal appearance.  Cardiovascular:     Rate and Rhythm: Normal rate and regular rhythm.     Heart sounds: Normal heart sounds.  Pulmonary:     Effort: Pulmonary effort is normal.     Breath sounds: Normal breath sounds.  Abdominal:     General: There is no distension.     Palpations: Abdomen is soft. There is no mass.  Musculoskeletal:        General: No swelling.  Skin:    General: Skin is warm.  Neurological:     General: No focal deficit present.     Mental Status: She is alert and oriented to person, place, and time.  Psychiatric:        Mood and Affect: Mood normal.        Behavior: Behavior normal.    Right breast has no palpable masses.  Left mastectomy is within normal limits.  LABORATORY DATA:  I have reviewed the labs as listed.  CBC    Component Value Date/Time   WBC 5.6 01/23/2019 1256   RBC 3.78 (L) 01/23/2019 1256   HGB 12.9 01/23/2019 1256   HCT 39.4 01/23/2019 1256   PLT 190 01/23/2019 1256   MCV 104.2 (H) 01/23/2019 1256   MCH 34.1 (H) 01/23/2019 1256   MCHC 32.7 01/23/2019 1256   RDW 12.3 01/23/2019 1256   LYMPHSABS 1.7 01/23/2019 1256   MONOABS 0.5 01/23/2019 1256   EOSABS 0.2 01/23/2019 1256   BASOSABS 0.0 01/23/2019 1256   CMP Latest Ref Rng & Units 01/23/2019 12/22/2017 06/09/2017  Glucose 70 - 99 mg/dL 108(H) 154(H) 99  BUN 8 - 23 mg/dL _0 Creatinine 0.44 - 1.00 mg/dL 0.75 0.79 0.76  Sodium 135 - 145 mmol/L 139 139 141  Potassium 3.5 - 5.1 mmol/L 3.8 3.3(L) 3.0(L)  Chloride 98 - 111 mmol/L 102 101 102  CO2 22 - 32 mmol/L _1 Calcium 8.9 - 10.3 mg/dL 9.3 9.5 9.1  Total Protein 6.5 - 8.1 g/dL 7.0 6.8 7.1  Total Bilirubin  0.3 - 1.2 mg/dL 0.5 0.8 0.8  Alkaline Phos 38 - 126 U/L 89 97 100  AST 15 - 41 U/L _0 ALT 0 - 44 U/L _1 DIAGNOSTIC IMAGING:  I have independently reviewed the scans and discussed with the patient.    I have reviewed Venita Lick LPN's note and agree with the documentation.  I personally performed a face-to-face visit, made revisions and my assessment and plan is as follows.    ASSESSMENT & PLAN:   Invasive ductal carcinoma metastasized to axillary lymph node, left 1.  Left breast cancer: -Right modified radical mastectomy on 07/17/2013, 1/12 lymph nodes positive, grade 2, 1.4 cm invasive tumor, margins negative, ER 74% positive, PR 16% positive, HER-2 negative, Ki-67 52%, PT1CPN1A.  HER-2/neu was positive on testing by [facility] on lymph node. - 6 cycles of [place] from 08/27/2013 through 12/18/2013, 1 year Herceptin completed on 08/28/2014. - Status post XRT completed in July 2015. - Anastrozole started in August 2015. -She is tolerating it very well.  Today physical examination did not reveal any palpable masses in the left mastectomy site.  Right breast has no palpable masses. - Plan was to continue anastrozole beyond 5 years, up to 10 years. -Last right breast mammogram was on 07/20/2018 which was BI-RADS 1.  We will schedule her for another mammogram before her next visit.  2.  Osteoporosis: - I reviewed the results of the DEXA scan from 01/09/2019 which shows T score of -2.6.  This is slightly worse from the prior scan of -2.5 and -2.4 prior to that. -She received temporarily Prolia on 10/09/2014, 04/09/2015 and 09/28/2015.  This was discontinued secondary to high insurance cost. -She has also taken Fosamax, but not sure why she discontinued it. - We talked about restarting her on Fosamax.  We talked about side effects in detail.  She does not have any teeth in the upper area.  She does have her own teeth in the lower jaw.  She does not report any problems with it. - I talked to her in detail about side effects and how to take Fosamax.  She was also counseled to take calcium and vitamin D supplements twice daily. - I plan to repeat a vitamin D level prior to next visit in 6 months.   Total  time spent is 25 minutes with more than 50% of the time spent face-to-face discussing treatment plan and coordination of care.  Orders placed this encounter:  Orders Placed This Encounter  Procedures  . MM DIAG BREAST TOMO BILATERAL  . MM 3D SCREEN BREAST BILATERAL  . CBC with Differential/Platelet  . Comprehensive metabolic panel  . Vitamin D 25 hydroxy      Derek Jack, MD [place] [phone]

## 2019-01-31 ENCOUNTER — Telehealth (HOSPITAL_COMMUNITY): Payer: Self-pay | Admitting: *Deleted

## 2019-01-31 MED ORDER — ALENDRONATE SODIUM 70 MG PO TABS: 70.0000 mg | ORAL_TABLET | ORAL | 6 refills | Status: DC

## 2019-01-31 NOTE — Telephone Encounter (Signed)
Called pt per Dr. Raliegh Ip to let her know to take Calcium 1200 mg and Vitamin D 1000 mg twice a day or take [place] with D equivalent to the recommended dosage. Patient verbalized understanding.

## 2019-01-31 NOTE — Addendum Note (Signed)
Addended by: Derek Jack on: 01/31/2019 07:53 AM   Modules accepted: Orders

## 2019-03-01 ENCOUNTER — Other Ambulatory Visit (HOSPITAL_COMMUNITY): Payer: Self-pay | Admitting: Hematology

## 2019-03-01 DIAGNOSIS — C50912 Malignant neoplasm of unspecified site of left female breast: Secondary | ICD-10-CM

## 2019-07-29 ENCOUNTER — Other Ambulatory Visit: Payer: Self-pay

## 2019-07-29 ENCOUNTER — Ambulatory Visit (HOSPITAL_COMMUNITY)
Admission: RE | Admit: 2019-07-29 | Discharge: 2019-07-29 | Disposition: A | Discharge status: Home / Self Care | Payer: PRIVATE HEALTH INSURANCE | Source: Ambulatory Visit | Attending: Hematology | Admitting: Hematology

## 2019-07-29 ENCOUNTER — Inpatient Hospital Stay (HOSPITAL_COMMUNITY): Payer: PRIVATE HEALTH INSURANCE | Attending: Hematology

## 2019-07-29 DIAGNOSIS — Z1239 Encounter for other screening for malignant neoplasm of breast: Secondary | ICD-10-CM

## 2019-07-29 DIAGNOSIS — C773 Secondary and unspecified malignant neoplasm of axilla and upper limb lymph nodes: Secondary | ICD-10-CM | POA: Insufficient documentation

## 2019-07-29 DIAGNOSIS — C50912 Malignant neoplasm of unspecified site of left female breast: Secondary | ICD-10-CM | POA: Insufficient documentation

## 2019-07-29 DIAGNOSIS — Z170 Estrogen receptor positive status [ER+]: Secondary | ICD-10-CM | POA: Insufficient documentation

## 2019-07-29 DIAGNOSIS — Z1231 Encounter for screening mammogram for malignant neoplasm of breast: Secondary | ICD-10-CM | POA: Diagnosis not present

## 2019-07-29 LAB — CBC WITH DIFFERENTIAL/PLATELET
Abs Immature Granulocytes: 0.01 K/uL (ref 0.00–0.07)
Basophils Absolute: 0 K/uL (ref 0.0–0.1)
Basophils Relative: 1 %
Eosinophils Absolute: 0.2 K/uL (ref 0.0–0.5)
Eosinophils Relative: 4 %
HCT: 39.3 % (ref 36.0–46.0)
Hemoglobin: 12.6 g/dL (ref 12.0–15.0)
Immature Granulocytes: 0 %
Lymphocytes Relative: 31 %
Lymphs Abs: 1.6 K/uL (ref 0.7–4.0)
MCH: 34.1 pg — ABNORMAL HIGH (ref 26.0–34.0)
MCHC: 32.1 g/dL (ref 30.0–36.0)
MCV: 106.2 fL — ABNORMAL HIGH (ref 80.0–100.0)
Monocytes Absolute: 0.4 K/uL (ref 0.1–1.0)
Monocytes Relative: 7 %
Neutro Abs: 3 K/uL (ref 1.7–7.7)
Neutrophils Relative %: 57 %
Platelets: 167 K/uL (ref 150–400)
RBC: 3.7 MIL/uL — ABNORMAL LOW (ref 3.87–5.11)
RDW: 11.8 % (ref 11.5–15.5)
WBC: 5.3 K/uL (ref 4.0–10.5)
nRBC: 0 % (ref 0.0–0.2)

## 2019-07-29 LAB — COMPREHENSIVE METABOLIC PANEL WITH GFR
ALT: 23 U/L (ref 0–44)
AST: 21 U/L (ref 15–41)
Albumin: 4.3 g/dL (ref 3.5–5.0)
Alkaline Phosphatase: 64 U/L (ref 38–126)
Anion gap: 12 (ref 5–15)
BUN: 17 mg/dL (ref 8–23)
CO2: 26 mmol/L (ref 22–32)
Calcium: 9.5 mg/dL (ref 8.9–10.3)
Chloride: 103 mmol/L (ref 98–111)
Creatinine, Ser: 0.69 mg/dL (ref 0.44–1.00)
GFR calc Af Amer: >60 mL/min
GFR calc non Af Amer: >60 mL/min
Glucose, Bld: 107 mg/dL — ABNORMAL HIGH (ref 70–99)
Potassium: 3.4 mmol/L — ABNORMAL LOW (ref 3.5–5.1)
Sodium: 141 mmol/L (ref 135–145)
Total Bilirubin: 0.3 mg/dL (ref 0.3–1.2)
Total Protein: 7.1 g/dL (ref 6.5–8.1)

## 2019-07-29 LAB — VITAMIN D 25 HYDROXY (VIT D DEFICIENCY, FRACTURES): Vit D, 25-Hydroxy: 27.59 ng/mL — ABNORMAL LOW (ref 30–100)

## 2019-07-30 ENCOUNTER — Encounter (HOSPITAL_COMMUNITY): Payer: Self-pay | Admitting: Hematology

## 2019-07-30 ENCOUNTER — Inpatient Hospital Stay (HOSPITAL_COMMUNITY): Payer: Medicare Other | Attending: Hematology | Admitting: Hematology

## 2019-07-30 VITALS — BP 133/70 | HR 84 | Temp 96.8°F | Resp 16 | Wt 136.5 lb

## 2019-07-30 DIAGNOSIS — K219 Gastro-esophageal reflux disease without esophagitis: Secondary | ICD-10-CM | POA: Insufficient documentation

## 2019-07-30 DIAGNOSIS — E538 Deficiency of other specified B group vitamins: Secondary | ICD-10-CM | POA: Insufficient documentation

## 2019-07-30 DIAGNOSIS — I10 Essential (primary) hypertension: Secondary | ICD-10-CM | POA: Insufficient documentation

## 2019-07-30 DIAGNOSIS — Z923 Personal history of irradiation: Secondary | ICD-10-CM | POA: Diagnosis not present

## 2019-07-30 DIAGNOSIS — M8580 Other specified disorders of bone density and structure, unspecified site: Secondary | ICD-10-CM | POA: Diagnosis not present

## 2019-07-30 DIAGNOSIS — Z170 Estrogen receptor positive status [ER+]: Secondary | ICD-10-CM | POA: Insufficient documentation

## 2019-07-30 DIAGNOSIS — Z9221 Personal history of antineoplastic chemotherapy: Secondary | ICD-10-CM | POA: Diagnosis not present

## 2019-07-30 DIAGNOSIS — Z79811 Long term (current) use of aromatase inhibitors: Secondary | ICD-10-CM | POA: Insufficient documentation

## 2019-07-30 DIAGNOSIS — Z87891 Personal history of nicotine dependence: Secondary | ICD-10-CM | POA: Diagnosis not present

## 2019-07-30 DIAGNOSIS — C50912 Malignant neoplasm of unspecified site of left female breast: Secondary | ICD-10-CM | POA: Diagnosis not present

## 2019-07-30 DIAGNOSIS — F419 Anxiety disorder, unspecified: Secondary | ICD-10-CM | POA: Insufficient documentation

## 2019-07-30 DIAGNOSIS — C773 Secondary and unspecified malignant neoplasm of axilla and upper limb lymph nodes: Secondary | ICD-10-CM

## 2019-07-30 NOTE — Assessment & Plan Note (Signed)
1.  Left breast cancer: -Right modified radical mastectomy on 07/17/2013, 1/12 lymph nodes positive, grade 2, 1.4 cm invasive tumor, margins negative, ER 74% positive, PR 16% positive, HER-2 negative, Ki-67 52%, PT1CPN1A.  HER-2/neu was positive on testing by [facility] on lymph node. - 6 cycles of [place] from 08/27/2013 through 12/18/2013, 1 year Herceptin completed on 08/28/2014. - Status post XRT completed in July 2015. - Anastrozole started in August 2015. -The plan was to continue anastrozole beyond 5 years, up to 10 years.  She is tolerating it very well except occasional hot flashes. -Physical exam today did not reveal any palpable mass in the right breast.  No palpable masses at the left mastectomy site.  Mammogram on 07/29/2019 was BI-RADS Category 1. -We will reevaluate her in 6 months.  We will plan to repeat mammogram of the right breast in 1 year.  2.  Osteoporosis: -DEXA scan from 01/09/2019 shows T score of -2.6.  This is slightly worse from the prior scan of -2.5 and -2.4 prior to that. -She received temporarily Prolia on 10/09/2014, 04/09/2015 and 09/28/2015.  This was discontinued secondary to high insurance cost. -She has also taken Fosamax, but not sure why she discontinued it. -She was started back on Fosamax weekly on 01/30/2019.  She is tolerating it very well. -Her vitamin D level was 27.59.  She is currently taking 1000 units daily.  I have told her to increase it to 2000 units daily.  She will continue calcium 1200 mg daily. -We will see her back in 6 months with repeat labs.

## 2019-07-30 NOTE — Patient Instructions (Addendum)
[place] [facility] Discharge Instructions  You were seen today by Dr. Delton Coombes. He went over your recent lab results. Cotinue taking the Calcium and Fosamax. Please increase your vitamin D to 2,000 units daily. He will see you back in 6 months for labs and follow up.   Thank you for choosing [place] [facility] to provide your oncology and hematology care.  To afford each patient quality time with our provider, please arrive at least 15 minutes before your scheduled appointment time.   If you have a lab appointment with the [facility] please come in thru the  Main Entrance and check in at the main information desk  You need to re-schedule your appointment should you arrive 10 or more minutes late.  We strive to give you quality time with our providers, and arriving late affects you and other patients whose appointments are after yours.  Also, if you no show three or more times for appointments you may be dismissed from the clinic at the providers discretion.     Again, thank you for choosing [facility].  Our hope is that these requests will decrease the amount of time that you wait before being seen by our physicians.       _____________________________________________________________  Should you have questions after your visit to [facility], please contact our office at [phone] between the hours of 8:00 a.m. and 4:30 p.m.  Voicemails left after 4:00 p.m. will not be returned until the following business day.  For prescription refill requests, have your pharmacy contact our office and allow 72 hours.    Cancer Center Support Programs:   > Cancer Support Group  2nd Tuesday of the month 1pm-2pm, Journey Room

## 2019-07-30 NOTE — Progress Notes (Signed)
Heather Vaughan,  [id number]   CLINIC:  Medical Oncology/Hematology  PCP:  Scherrie Bateman [place] [address] [phone]   REASON FOR VISIT:  Follow-up for left breast cancer.   BRIEF ONCOLOGIC HISTORY:  Oncology History  Invasive ductal carcinoma metastasized to axillary lymph node, left  05/28/2013 Imaging   Screening mammogram   06/19/2013 Imaging   Left diagnostic mammogram   06/25/2013 Imaging   CT CAP- borderline enlarged axillary lymph node on left.  Small pulmonary nodules noted but too small for PET or biopsy; surveillance recommended.    07/03/2013 Surgery   Left partial mastectomy.  Invasive ductal carcinoma. 1.4 cm.  POSITIVE MARGINS.  Additional extensive DCIS noted.  ER 74%, PR 16%, Ki-67 52%, HER2 NEGATIVE.   07/17/2013 Surgery   Left modified radiacl mastectomy with left axillary node dissection.  Negative residual cancer.  Negative inked margins.  1/12 positive lymph nodes.  FINAL MARGINS NEGATIVE.  LYMPH NODE IS HER2 POSITIVE.   08/13/2013 Imaging   MUGA scan shows left ventricular EF of 62%   08/27/2013 - 12/18/2013 Chemotherapy   TCH x 6 cycles   01/07/2014 -  Chemotherapy   Herceptin x 52 weeks   01/27/2014 - 03/13/2014 Radiation Therapy   By Dr. Pablo Ledger   03/29/2014 -  Anti-estrogen oral therapy   Aromasin   10/24/2014 Imaging   Bone density- BMD as determined from Femur Neck Right is 0.698 g/cm2 with a T-Score of -2.4. This patient is considered osteopenic according to [facility]) criteria. (Lumbar spine was not utilized due to advanced degenerative changes).   11/05/2014 Imaging   MUGA- Left ventricular ejection fraction equals 70%. No change from prior.   04/27/2015 Procedure   Port-A-Cath removal by Dr. Arnoldo Morale   06/19/2015 Imaging   CT chest- Status post left mastectomy, without recurrent or metastatic disease. Decrease in size of right-sided  pulmonary nodules, favoring an infectious or inflammatory etiology.   06/21/2017 Imaging   CT Chest w contrast: IMPRESSION: 1. No evidence of metastatic disease in the thorax. Multiple previously noted tiny pulmonary nodules are stable compared to prior studies and considered benign. 2. Aortic atherosclerosis. 3. Mild diffuse bronchial wall thickening with mild centrilobular and paraseptal emphysema most apparent the lung apices; imaging findings that may suggest underlying COPD.      CANCER STAGING: Cancer Staging Invasive ductal carcinoma metastasized to axillary lymph node, left Staging form: Breast, AJCC 7th Edition - Clinical: Stage IIA (T1c, N1, cM0) - Signed by Baird Cancer, PA-C on 09/15/2013    INTERVAL HISTORY:  [name] 62 y.o. female seen for follow-up of left breast cancer.  She is continuing to tolerate anastrozole reasonably well.  She does have occasional hot flashes.  Appetite and energy levels are 100%.  Upper back pain of chronic duration is stable.  She is taking calcium and vitamin D supplements daily.  Denies any new onset pains.    REVIEW OF SYSTEMS:  Review of Systems  All other systems reviewed and are negative.    PAST MEDICAL/SURGICAL HISTORY:  Past Medical History:  Diagnosis Date  . Anxiety   . B12 deficiency 01/27/2016  . Barrett's esophagus   . Breast cancer ([place])   . Breast cancer, left breast ([place])   . Bronchitis 07/27/2011  . GERD (gastroesophageal reflux disease)   . Hypertension   . Osteopenia 10/09/2014   Past Surgical History:  Procedure Laterality Date  . BREAST  BIOPSY Bilateral    X 4- benign  . ESOPHAGOGASTRODUODENOSCOPY  08/03/2011   Procedure: ESOPHAGOGASTRODUODENOSCOPY (EGD);  Surgeon: Rogene Houston, MD;  Location: [facility] ENDO SU[facility];  Service: Endoscopy;  Laterality: N/A;  12:45  . EXCISION MORTON'S NEUROMA  07/14/2011   Procedure: EXCISION MORTON'S NEUROMA;  Surgeon: Marcheta Grammes;  Location: [facility] ORS;   Service: Orthopedics;  Laterality: Right;  Removal Neuroma 3rd Interspace Right Foot  . MASTECTOMY Left   . MASTECTOMY MODIFIED RADICAL Left 07/17/2013   Procedure: MASTECTOMY MODIFIED RADICAL WITH LEFT AXILLARY TISSUE REMOVAL;  Surgeon: Scherry Ran, MD;  Location: [facility] ORS;  Service: General;  Laterality: Left;  . PARTIAL MASTECTOMY WITH NEEDLE LOCALIZATION Left 07/03/2013   Procedure: PARTIAL MASTECTOMY STATUS POST NEEDLE LOCALIZATION;  Surgeon: Scherry Ran, MD;  Location: [facility] ORS;  Service: General;  Laterality: Left;  . PORT-A-CATH REMOVAL Right 04/27/2015   Procedure: REMOVAL PORT-A-CATH;  Surgeon: Aviva Signs, MD;  Location: [facility] ORS;  Service: General;  Laterality: Right;  . PORTACATH PLACEMENT Right 08/16/2013   Procedure: INSERTION PORT-A-CATH RIGHT SUBCLAVIAN;  Surgeon: Scherry Ran, MD;  Location: [facility] ORS;  Service: General;  Laterality: Right;     SOCIAL HISTORY:  Social History   Socioeconomic History  . Marital status: Married    Spouse name: Not on file  . Number of children: Not on file  . Years of education: Not on file  . Highest education level: Not on file  Occupational History  . Not on file  Social Needs  . Financial resource strain: Not on file  . Food insecurity    Worry: Not on file    Inability: Not on file  . Transportation needs    Medical: Not on file    Non-medical: Not on file  Tobacco Use  . Smoking status: Former Smoker    Packs/day: 1.00    Years: 20.00    Pack years: 20.00    Types: Cigarettes    Quit date: 09/05/1993    Years since quitting: 25.9  . Smokeless tobacco: Never Used  Substance and Sexual Activity  . Alcohol use: No    Comment: occasionally  . Drug use: No  . Sexual activity: Not on file  Lifestyle  . Physical activity    Days per week: Not on file    Minutes per session: Not on file  . Stress: Not on file  Relationships  . Social Herbalist on phone: Not on file    Gets together: Not on file     Attends religious service: Not on file    Active member of club or organization: Not on file    Attends meetings of clubs or organizations: Not on file    Relationship status: Not on file  . Intimate partner violence    Fear of current or ex partner: Not on file    Emotionally abused: Not on file    Physically abused: Not on file    Forced sexual activity: Not on file  Other Topics Concern  . Not on file  Social History Narrative  . Not on file    FAMILY HISTORY:  Family History  Problem Relation Age of Onset  . Anesthesia problems Neg Hx   . Hypotension Neg Hx   . Malignant hyperthermia Neg Hx   . Pseudochol deficiency Neg Hx     CURRENT MEDICATIONS:  Outpatient Encounter Medications as of 07/30/2019  Medication Sig Note  . alendronate (FOSAMAX) 70 MG  tablet Take 1 tablet (70 mg total) by mouth once a week. Take with a full glass of water on an empty stomach.   Marland Kitchen anastrozole (ARIMIDEX) 1 MG tablet TAKE 1 TABLET(1 MG) BY MOUTH DAILY   . Calcium Carbonate-Vitamin D (CALCIUM-VITAMIN D3 PO) Take by mouth.   . DULoxetine (CYMBALTA) 30 MG capsule Take 30 mg by mouth daily.   . hydrochlorothiazide (HYDRODIURIL) 25 MG tablet    . omeprazole (PRILOSEC) 20 MG capsule Take 20 mg by mouth 2 (two) times daily.  06/26/2015: Received from: External Pharmacy  . traZODone (DESYREL) 50 MG tablet Take 25 mg by mouth at bedtime as needed for sleep. For sleep 07/16/2013: ---  . losartan (COZAAR) 100 MG tablet    . [DISCONTINUED] losartan-hydrochlorothiazide (HYZAAR) 100-25 MG per tablet Take 1 tablet by mouth daily.      No facility-administered encounter medications on file as of 07/30/2019.     ALLERGIES:  No Known Allergies   PHYSICAL EXAM:  ECOG Performance status: 1  Vitals:   07/30/19 1400  BP: 133/70  Pulse: 84  Resp: 16  Temp: (!) 96.8 F (36 C)  SpO2: 99%   Filed Weights   07/30/19 1400  Weight: 136 lb 8 oz (61.9 kg)    Physical Exam Vitals signs reviewed.   Constitutional:      Appearance: Normal appearance.  Cardiovascular:     Rate and Rhythm: Normal rate and regular rhythm.     Heart sounds: Normal heart sounds.  Pulmonary:     Effort: Pulmonary effort is normal.     Breath sounds: Normal breath sounds.  Abdominal:     General: There is no distension.     Palpations: Abdomen is soft. There is no mass.  Musculoskeletal:        General: No swelling.  Skin:    General: Skin is warm.  Neurological:     General: No focal deficit present.     Mental Status: She is alert and oriented to person, place, and time.  Psychiatric:        Mood and Affect: Mood normal.        Behavior: Behavior normal.    Right breast has no palpable masses.  Left mastectomy is within normal limits.  LABORATORY DATA:  I have reviewed the labs as listed.  CBC    Component Value Date/Time   WBC 5.3 07/29/2019 0922   RBC 3.70 (L) 07/29/2019 0922   HGB 12.6 07/29/2019 0922   HCT 39.3 07/29/2019 0922   PLT 167 07/29/2019 0922   MCV 106.2 (H) 07/29/2019 0922   MCH 34.1 (H) 07/29/2019 0922   MCHC 32.1 07/29/2019 0922   RDW 11.8 07/29/2019 0922   LYMPHSABS 1.6 07/29/2019 0922   MONOABS 0.4 07/29/2019 0922   EOSABS 0.2 07/29/2019 0922   BASOSABS 0.0 07/29/2019 0922   CMP Latest Ref Rng & Units 07/29/2019 01/23/2019 12/22/2017  Glucose 70 - 99 mg/dL 107(H) 108(H) 154(H)  BUN 8 - 23 mg/dL _0 Creatinine 0.44 - 1.00 mg/dL 0.69 0.75 0.79  Sodium 135 - 145 mmol/L 141 139 139  Potassium 3.5 - 5.1 mmol/L 3.4(L) 3.8 3.3(L)  Chloride 98 - 111 mmol/L 103 102 101  CO2 22 - 32 mmol/L _1 Calcium 8.9 - 10.3 mg/dL 9.5 9.3 9.5  Total Protein 6.5 - 8.1 g/dL 7.1 7.0 6.8  Total Bilirubin 0.3 - 1.2 mg/dL 0.3 0.5 0.8  Alkaline Phos 38 - 126 U/L 64 89  97  AST 15 - 41 U/L _0 ALT 0 - 44 U/L _1 DIAGNOSTIC IMAGING:  I have independently reviewed the scans and discussed with the patient.   I have reviewed Venita Lick LPN's note and  agree with the documentation.  I personally performed a face-to-face visit, made revisions and my assessment and plan is as follows.    ASSESSMENT & PLAN:   Invasive ductal carcinoma metastasized to axillary lymph node, left 1.  Left breast cancer: -Right modified radical mastectomy on 07/17/2013, 1/12 lymph nodes positive, grade 2, 1.4 cm invasive tumor, margins negative, ER 74% positive, PR 16% positive, HER-2 negative, Ki-67 52%, PT1CPN1A.  HER-2/neu was positive on testing by [facility] on lymph node. - 6 cycles of [place] from 08/27/2013 through 12/18/2013, 1 year Herceptin completed on 08/28/2014. - Status post XRT completed in July 2015. - Anastrozole started in August 2015. -The plan was to continue anastrozole beyond 5 years, up to 10 years.  She is tolerating it very well except occasional hot flashes. -Physical exam today did not reveal any palpable mass in the right breast.  No palpable masses at the left mastectomy site.  Mammogram on 07/29/2019 was BI-RADS Category 1. -We will reevaluate her in 6 months.  We will plan to repeat mammogram of the right breast in 1 year.  2.  Osteoporosis: -DEXA scan from 01/09/2019 shows T score of -2.6.  This is slightly worse from the prior scan of -2.5 and -2.4 prior to that. -She received temporarily Prolia on 10/09/2014, 04/09/2015 and 09/28/2015.  This was discontinued secondary to high insurance cost. -She has also taken Fosamax, but not sure why she discontinued it. -She was started back on Fosamax weekly on 01/30/2019.  She is tolerating it very well. -Her vitamin D level was 27.59.  She is currently taking 1000 units daily.  I have told her to increase it to 2000 units daily.  She will continue calcium 1200 mg daily. -We will see her back in 6 months with repeat labs.   Orders placed this encounter:  Orders Placed This Encounter  Procedures  . CBC with Differential/Platelet  . Comprehensive metabolic panel  . Vitamin D 25 hydroxy      Derek Jack, MD [place] [phone]

## 2019-08-04 ENCOUNTER — Other Ambulatory Visit (HOSPITAL_COMMUNITY): Payer: Self-pay | Admitting: Hematology

## 2019-08-04 DIAGNOSIS — M810 Age-related osteoporosis without current pathological fracture: Secondary | ICD-10-CM

## 2019-08-12 DIAGNOSIS — G4709 Other insomnia: Secondary | ICD-10-CM | POA: Diagnosis not present

## 2019-08-12 DIAGNOSIS — K219 Gastro-esophageal reflux disease without esophagitis: Secondary | ICD-10-CM | POA: Diagnosis not present

## 2019-08-12 DIAGNOSIS — I10 Essential (primary) hypertension: Secondary | ICD-10-CM | POA: Diagnosis not present

## 2019-09-08 ENCOUNTER — Other Ambulatory Visit (HOSPITAL_COMMUNITY): Payer: Self-pay | Admitting: Hematology

## 2019-09-08 DIAGNOSIS — C50912 Malignant neoplasm of unspecified site of left female breast: Secondary | ICD-10-CM

## 2019-09-09 ENCOUNTER — Other Ambulatory Visit (HOSPITAL_COMMUNITY): Payer: Self-pay | Admitting: Hematology

## 2019-09-09 DIAGNOSIS — C50912 Malignant neoplasm of unspecified site of left female breast: Secondary | ICD-10-CM

## 2019-11-07 DIAGNOSIS — I10 Essential (primary) hypertension: Secondary | ICD-10-CM | POA: Diagnosis not present

## 2019-11-07 DIAGNOSIS — K219 Gastro-esophageal reflux disease without esophagitis: Secondary | ICD-10-CM | POA: Diagnosis not present

## 2019-11-07 DIAGNOSIS — Z0000 Encounter for general adult medical examination without abnormal findings: Secondary | ICD-10-CM | POA: Diagnosis not present

## 2019-11-08 DIAGNOSIS — I10 Essential (primary) hypertension: Secondary | ICD-10-CM | POA: Diagnosis not present

## 2019-11-08 DIAGNOSIS — Z0000 Encounter for general adult medical examination without abnormal findings: Secondary | ICD-10-CM | POA: Diagnosis not present

## 2019-11-08 DIAGNOSIS — K219 Gastro-esophageal reflux disease without esophagitis: Secondary | ICD-10-CM | POA: Diagnosis not present

## 2019-11-08 DIAGNOSIS — Z1389 Encounter for screening for other disorder: Secondary | ICD-10-CM | POA: Diagnosis not present

## 2020-01-28 ENCOUNTER — Other Ambulatory Visit: Payer: Self-pay

## 2020-01-28 ENCOUNTER — Inpatient Hospital Stay (HOSPITAL_COMMUNITY): Payer: Medicare Other | Attending: Hematology

## 2020-01-28 DIAGNOSIS — K219 Gastro-esophageal reflux disease without esophagitis: Secondary | ICD-10-CM | POA: Diagnosis not present

## 2020-01-28 DIAGNOSIS — E538 Deficiency of other specified B group vitamins: Secondary | ICD-10-CM | POA: Diagnosis not present

## 2020-01-28 DIAGNOSIS — Z79811 Long term (current) use of aromatase inhibitors: Secondary | ICD-10-CM | POA: Insufficient documentation

## 2020-01-28 DIAGNOSIS — E559 Vitamin D deficiency, unspecified: Secondary | ICD-10-CM | POA: Diagnosis not present

## 2020-01-28 DIAGNOSIS — F419 Anxiety disorder, unspecified: Secondary | ICD-10-CM | POA: Insufficient documentation

## 2020-01-28 DIAGNOSIS — R232 Flushing: Secondary | ICD-10-CM | POA: Insufficient documentation

## 2020-01-28 DIAGNOSIS — Z9012 Acquired absence of left breast and nipple: Secondary | ICD-10-CM | POA: Insufficient documentation

## 2020-01-28 DIAGNOSIS — I10 Essential (primary) hypertension: Secondary | ICD-10-CM | POA: Insufficient documentation

## 2020-01-28 DIAGNOSIS — Z87891 Personal history of nicotine dependence: Secondary | ICD-10-CM | POA: Insufficient documentation

## 2020-01-28 DIAGNOSIS — Z170 Estrogen receptor positive status [ER+]: Secondary | ICD-10-CM | POA: Diagnosis not present

## 2020-01-28 DIAGNOSIS — M810 Age-related osteoporosis without current pathological fracture: Secondary | ICD-10-CM | POA: Diagnosis not present

## 2020-01-28 DIAGNOSIS — Z923 Personal history of irradiation: Secondary | ICD-10-CM | POA: Insufficient documentation

## 2020-01-28 DIAGNOSIS — I700 Atherosclerosis of aorta: Secondary | ICD-10-CM | POA: Insufficient documentation

## 2020-01-28 DIAGNOSIS — C50912 Malignant neoplasm of unspecified site of left female breast: Secondary | ICD-10-CM | POA: Diagnosis not present

## 2020-01-28 DIAGNOSIS — J432 Centrilobular emphysema: Secondary | ICD-10-CM | POA: Diagnosis not present

## 2020-01-28 DIAGNOSIS — C773 Secondary and unspecified malignant neoplasm of axilla and upper limb lymph nodes: Secondary | ICD-10-CM | POA: Diagnosis not present

## 2020-01-28 DIAGNOSIS — Z79899 Other long term (current) drug therapy: Secondary | ICD-10-CM | POA: Diagnosis not present

## 2020-01-28 LAB — VITAMIN D 25 HYDROXY (VIT D DEFICIENCY, FRACTURES): Vit D, 25-Hydroxy: 49.86 ng/mL (ref 30–100)

## 2020-01-28 LAB — COMPREHENSIVE METABOLIC PANEL
ALT: 22 U/L (ref 0–44)
AST: 20 U/L (ref 15–41)
Albumin: 4.4 g/dL (ref 3.5–5.0)
Alkaline Phosphatase: 75 U/L (ref 38–126)
Anion gap: 9 (ref 5–15)
BUN: 18 mg/dL (ref 8–23)
CO2: 28 mmol/L (ref 22–32)
Calcium: 9.3 mg/dL (ref 8.9–10.3)
Chloride: 102 mmol/L (ref 98–111)
Creatinine, Ser: 0.75 mg/dL (ref 0.44–1.00)
GFR calc Af Amer: >60 mL/min (ref >60)
GFR calc non Af Amer: >60 mL/min (ref >60)
Glucose, Bld: 117 mg/dL — ABNORMAL HIGH (ref 70–99)
Potassium: 3.7 mmol/L (ref 3.5–5.1)
Sodium: 139 mmol/L (ref 135–145)
Total Bilirubin: 0.6 mg/dL (ref 0.3–1.2)
Total Protein: 7.3 g/dL (ref 6.5–8.1)

## 2020-01-28 LAB — CBC WITH DIFFERENTIAL/PLATELET
Abs Immature Granulocytes: 0.01 10*3/uL (ref 0.00–0.07)
Basophils Absolute: 0 10*3/uL (ref 0.0–0.1)
Basophils Relative: 1 %
Eosinophils Absolute: 0.1 10*3/uL (ref 0.0–0.5)
Eosinophils Relative: 2 %
HCT: 38.8 % (ref 36.0–46.0)
Hemoglobin: 12.6 g/dL (ref 12.0–15.0)
Immature Granulocytes: 0 %
Lymphocytes Relative: 31 %
Lymphs Abs: 1.6 10*3/uL (ref 0.7–4.0)
MCH: 33.8 pg (ref 26.0–34.0)
MCHC: 32.5 g/dL (ref 30.0–36.0)
MCV: 104 fL — ABNORMAL HIGH (ref 80.0–100.0)
Monocytes Absolute: 0.5 10*3/uL (ref 0.1–1.0)
Monocytes Relative: 9 %
Neutro Abs: 3 10*3/uL (ref 1.7–7.7)
Neutrophils Relative %: 57 %
Platelets: 198 10*3/uL (ref 150–400)
RBC: 3.73 MIL/uL — ABNORMAL LOW (ref 3.87–5.11)
RDW: 12 % (ref 11.5–15.5)
WBC: 5.2 10*3/uL (ref 4.0–10.5)
nRBC: 0 % (ref 0.0–0.2)

## 2020-02-04 ENCOUNTER — Inpatient Hospital Stay (HOSPITAL_COMMUNITY): Payer: Medicare Other | Admitting: Nurse Practitioner

## 2020-02-04 VITALS — BP 129/65 | HR 89 | Temp 96.8°F | Resp 17 | Wt 145.4 lb

## 2020-02-04 DIAGNOSIS — C50919 Malignant neoplasm of unspecified site of unspecified female breast: Secondary | ICD-10-CM | POA: Diagnosis not present

## 2020-02-04 DIAGNOSIS — J432 Centrilobular emphysema: Secondary | ICD-10-CM | POA: Diagnosis not present

## 2020-02-04 DIAGNOSIS — M810 Age-related osteoporosis without current pathological fracture: Secondary | ICD-10-CM | POA: Diagnosis not present

## 2020-02-04 DIAGNOSIS — Z79811 Long term (current) use of aromatase inhibitors: Secondary | ICD-10-CM | POA: Diagnosis not present

## 2020-02-04 DIAGNOSIS — Z923 Personal history of irradiation: Secondary | ICD-10-CM | POA: Diagnosis not present

## 2020-02-04 DIAGNOSIS — K219 Gastro-esophageal reflux disease without esophagitis: Secondary | ICD-10-CM | POA: Diagnosis not present

## 2020-02-04 DIAGNOSIS — Z87891 Personal history of nicotine dependence: Secondary | ICD-10-CM | POA: Diagnosis not present

## 2020-02-04 DIAGNOSIS — Z79899 Other long term (current) drug therapy: Secondary | ICD-10-CM | POA: Diagnosis not present

## 2020-02-04 DIAGNOSIS — Z170 Estrogen receptor positive status [ER+]: Secondary | ICD-10-CM | POA: Diagnosis not present

## 2020-02-04 DIAGNOSIS — Z1231 Encounter for screening mammogram for malignant neoplasm of breast: Secondary | ICD-10-CM | POA: Diagnosis not present

## 2020-02-04 DIAGNOSIS — C773 Secondary and unspecified malignant neoplasm of axilla and upper limb lymph nodes: Secondary | ICD-10-CM

## 2020-02-04 DIAGNOSIS — Z9012 Acquired absence of left breast and nipple: Secondary | ICD-10-CM | POA: Diagnosis not present

## 2020-02-04 DIAGNOSIS — E559 Vitamin D deficiency, unspecified: Secondary | ICD-10-CM | POA: Diagnosis not present

## 2020-02-04 DIAGNOSIS — I700 Atherosclerosis of aorta: Secondary | ICD-10-CM | POA: Diagnosis not present

## 2020-02-04 DIAGNOSIS — E538 Deficiency of other specified B group vitamins: Secondary | ICD-10-CM | POA: Diagnosis not present

## 2020-02-04 DIAGNOSIS — C50912 Malignant neoplasm of unspecified site of left female breast: Secondary | ICD-10-CM | POA: Diagnosis not present

## 2020-02-04 DIAGNOSIS — R232 Flushing: Secondary | ICD-10-CM | POA: Diagnosis not present

## 2020-02-04 DIAGNOSIS — I10 Essential (primary) hypertension: Secondary | ICD-10-CM | POA: Diagnosis not present

## 2020-02-04 NOTE — Progress Notes (Signed)
[place], [place] [address]   CLINIC:  Medical Oncology/Hematology  PCP:  Scherrie Bateman [address] [place] [address] [phone]   REASON FOR VISIT: Follow-up for breast cancer   CURRENT THERAPY: Arimidex  BRIEF ONCOLOGIC HISTORY:  Oncology History  Invasive ductal carcinoma metastasized to axillary lymph node, left  05/28/2013 Imaging   Screening mammogram   06/19/2013 Imaging   Left diagnostic mammogram   06/25/2013 Imaging   CT CAP- borderline enlarged axillary lymph node on left.  Small pulmonary nodules noted but too small for PET or biopsy; surveillance recommended.    07/03/2013 Surgery   Left partial mastectomy.  Invasive ductal carcinoma. 1.4 cm.  POSITIVE MARGINS.  Additional extensive DCIS noted.  ER 74%, PR 16%, Ki-67 52%, HER2 NEGATIVE.   07/17/2013 Surgery   Left modified radiacl mastectomy with left axillary node dissection.  Negative residual cancer.  Negative inked margins.  1/12 positive lymph nodes.  FINAL MARGINS NEGATIVE.  LYMPH NODE IS HER2 POSITIVE.   08/13/2013 Imaging   MUGA scan shows left ventricular EF of 62%   08/27/2013 - 12/18/2013 Chemotherapy   TCH x 6 cycles   01/07/2014 -  Chemotherapy   Herceptin x 52 weeks   01/27/2014 - 03/13/2014 Radiation Therapy   By Dr. Pablo Ledger   03/29/2014 -  Anti-estrogen oral therapy   Aromasin   10/24/2014 Imaging   Bone density- BMD as determined from Femur Neck Right is 0.698 g/cm2 with a T-Score of -2.4. This patient is considered osteopenic according to [facility]) criteria. (Lumbar spine was not utilized due to advanced degenerative changes).   11/05/2014 Imaging   MUGA- Left ventricular ejection fraction equals 70%. No change from prior.   04/27/2015 Procedure   Port-A-Cath removal by Dr. Arnoldo Morale   06/19/2015 Imaging   CT chest- Status post left mastectomy, without recurrent or metastatic disease. Decrease in size  of right-sided pulmonary nodules, favoring an infectious or inflammatory etiology.   06/21/2017 Imaging   CT Chest w contrast: IMPRESSION: 1. No evidence of metastatic disease in the thorax. Multiple previously noted tiny pulmonary nodules are stable compared to prior studies and considered benign. 2. Aortic atherosclerosis. 3. Mild diffuse bronchial wall thickening with mild centrilobular and paraseptal emphysema most apparent the lung apices; imaging findings that may suggest underlying COPD.     CANCER STAGING: Cancer Staging Invasive ductal carcinoma metastasized to axillary lymph node, left Staging form: Breast, AJCC 7th Edition - Clinical: Stage IIA (T1c, N1, cM0) - Signed by Baird Cancer, PA-C on 09/15/2013    INTERVAL HISTORY:  Heather Vaughan 64 y.o. female returns for routine follow-up for breast cancer.  Patient reports she is doing well since her last visit.  She denies any new lumps or bumps present.  She denies any new bone pain.  She is taking her medication as prescribed.  She has no unwanted side effects. Denies any nausea, vomiting, or diarrhea. Denies any new pains. Had not noticed any recent bleeding such as epistaxis, hematuria or hematochezia. Denies recent chest pain on exertion, shortness of breath on minimal exertion, pre-syncopal episodes, or palpitations. Denies any numbness or tingling in hands or feet. Denies any recent fevers, infections, or recent hospitalizations. Patient reports appetite at 100% and energy level at 100%.  She is eating well maintain her weight at this time.     REVIEW OF SYSTEMS:  Review of Systems  All other systems reviewed and are negative.  PAST MEDICAL/SURGICAL HISTORY:  Past Medical History:  Diagnosis Date  . Anxiety   . B12 deficiency 01/27/2016  . Barrett's esophagus   . Breast cancer ([place])   . Breast cancer, left breast ([place])   . Bronchitis 07/27/2011  . GERD (gastroesophageal reflux disease)   . Hypertension    . Osteopenia 10/09/2014   Past Surgical History:  Procedure Laterality Date  . BREAST BIOPSY Bilateral    X 4- benign  . ESOPHAGOGASTRODUODENOSCOPY  08/03/2011   Procedure: ESOPHAGOGASTRODUODENOSCOPY (EGD);  Surgeon: Rogene Houston, MD;  Location[facility];  Service: Endoscopy;  Laterality: N/A;  12:45  . EXCISION MORTON'S NEUROMA  07/14/2011   Procedure: EXCISION MORTON'S NEUROMA;  Surgeon: Marcheta Grammes;  Location: [facility] ORS;  Service: Orthopedics;  Laterality: Right;  Removal Neuroma 3rd Interspace Right Foot  . MASTECTOMY Left   . MASTECTOMY MODIFIED RADICAL Left 07/17/2013   Procedure: MASTECTOMY MODIFIED RADICAL WITH LEFT AXILLARY TISSUE REMOVAL;  Surgeon: Scherry Ran, MD;  Location: [facility] ORS;  Service: General;  Laterality: Left;  . PARTIAL MASTECTOMY WITH NEEDLE LOCALIZATION Left 07/03/2013   Procedure: PARTIAL MASTECTOMY STATUS POST NEEDLE LOCALIZATION;  Surgeon: Scherry Ran, MD;  Location: [facility] ORS;  Service: General;  Laterality: Left;  . PORT-A-CATH REMOVAL Right 04/27/2015   Procedure: REMOVAL PORT-A-CATH;  Surgeon: Aviva Signs, MD;  Location: [facility] ORS;  Service: General;  Laterality: Right;  . PORTACATH PLACEMENT Right 08/16/2013   Procedure: INSERTION PORT-A-CATH RIGHT SUBCLAVIAN;  Surgeon: Scherry Ran, MD;  Location: [facility] OR[facility];  Service: General;  Laterality: Right;     SOCIAL HISTORY:  Social History   Socioeconomic History  . Marital status: Married    Spouse name: Not on file  . Number of children: Not on file  . Years of education: Not on file  . Highest education level: Not on file  Occupational History  . Not on file  Tobacco Use  . Smoking status: Former Smoker    Packs/day: 1.00    Years: 20.00    Pack years: 20.00    Types: Cigarettes    Quit date: 09/05/1993    Years since quitting: 26.4  . Smokeless tobacco: Never Used  Substance and Sexual Activity  . Alcohol use: No    Comment: occasionally  . Drug use: No  . Sexual  activity: Not on file  Other Topics Concern  . Not on file  Social History Narrative  . Not on file   Social Determinants of Health   Financial Resource Strain:   . Difficulty of Paying Living Expenses:   Food Insecurity:   . Worried About Charity fundraiser in the Last Year:   . Arboriculturist in the Last Year:   Transportation Needs:   . Film/video editor (Medical):   Marland Kitchen Lack of Transportation (Non-Medical):   Physical Activity:   . Days of Exercise per Week:   . Minutes of Exercise per Session:   Stress:   . Feeling of Stress :   Social Connections:   . Frequency of Communication with Friends and Family:   . Frequency of Social Gatherings with Friends and Family:   . Attends Religious Services:   . Active Member of Clubs or Organizations:   . Attends Archivist Meetings:   Marland Kitchen Marital Status:   Intimate Partner Violence:   . Fear of Current or Ex-Partner:   . Emotionally Abused:   Marland Kitchen Physically Abused:   . Sexually Abused:  FAMILY HISTORY:  Family History  Problem Relation Age of Onset  . Anesthesia problems Neg Hx   . Hypotension Neg Hx   . Malignant hyperthermia Neg Hx   . Pseudochol deficiency Neg Hx     CURRENT MEDICATIONS:  Outpatient Encounter Medications as of 02/04/2020  Medication Sig Note  . alendronate (FOSAMAX) 70 MG tablet TAKE 1 TABLET(70 MG) BY MOUTH 1 TIME A WEEK WITH A FULL GLASS OF WATER AND ON AN EMPTY STOMACH   . anastrozole (ARIMIDEX) 1 MG tablet TAKE 1 TABLET(1 MG) BY MOUTH DAILY   . Calcium Carbonate-Vitamin D (CALCIUM-VITAMIN D3 PO) Take by mouth.   . DULoxetine (CYMBALTA) 30 MG capsule Take 30 mg by mouth daily.   . hydrochlorothiazide (HYDRODIURIL) 25 MG tablet    . losartan (COZAAR) 100 MG tablet    . omeprazole (PRILOSEC) 20 MG capsule Take 20 mg by mouth 2 (two) times daily.  06/26/2015: Received from: External Pharmacy  . traZODone (DESYREL) 50 MG tablet Take 25 mg by mouth at bedtime as needed for sleep. For sleep  07/16/2013: ---   No facility-administered encounter medications on file as of 02/04/2020.    ALLERGIES:  No Known Allergies   PHYSICAL EXAM:  ECOG Performance status: 1  Vitals:   02/04/20 1511  BP: 129/65  Pulse: 89  Resp: 17  Temp: (!) 96.8 F (36 C)  SpO2: 99%   Filed Weights   02/04/20 1511  Weight: 145 lb 6.4 oz (66 kg)   Physical Exam Constitutional:      Appearance: Normal appearance. She is normal weight.  Cardiovascular:     Rate and Rhythm: Normal rate and regular rhythm.     Heart sounds: Normal heart sounds.  Pulmonary:     Effort: Pulmonary effort is normal.     Breath sounds: Normal breath sounds.  Abdominal:     General: Bowel sounds are normal.     Palpations: Abdomen is soft.  Musculoskeletal:        General: Normal range of motion.  Skin:    General: Skin is warm.  Neurological:     Mental Status: She is alert and oriented to person, place, and time. Mental status is at baseline.  Psychiatric:        Mood and Affect: Mood normal.        Behavior: Behavior normal.        Thought Content: Thought content normal.        Judgment: Judgment normal.      LABORATORY DATA:  I have reviewed the labs as listed.  CBC    Component Value Date/Time   WBC 5.2 01/28/2020 1308   RBC 3.73 (L) 01/28/2020 1308   HGB 12.6 01/28/2020 1308   HCT 38.8 01/28/2020 1308   PLT 198 01/28/2020 1308   MCV 104.0 (H) 01/28/2020 1308   MCH 33.8 01/28/2020 1308   MCHC 32.5 01/28/2020 1308   RDW 12.0 01/28/2020 1308   LYMPHSABS 1.6 01/28/2020 1308   MONOABS 0.5 01/28/2020 1308   EOSABS 0.1 01/28/2020 1308   BASOSABS 0.0 01/28/2020 1308   CMP Latest Ref Rng & Units 01/28/2020 07/29/2019 01/23/2019  Glucose 70 - 99 mg/dL 117(H) 107(H) 108(H)  BUN 8 - 23 mg/dL '18 17 19  ' Creatinine 0.44 - 1.00 mg/dL 0.75 0.69 0.75  Sodium 135 - 145 mmol/L 139 141 139  Potassium 3.5 - 5.1 mmol/L 3.7 3.4(L) 3.8  Chloride 98 - 111 mmol/L 102 103 102  CO2 22 - 32  mmol/L '28 26 27   ' Calcium 8.9 - 10.3 mg/dL 9.3 9.5 9.3  Total Protein 6.5 - 8.1 g/dL 7.3 7.1 7.0  Total Bilirubin 0.3 - 1.2 mg/dL 0.6 0.3 0.5  Alkaline Phos 38 - 126 U/L 75 64 89  AST 15 - 41 U/L '20 21 20  ' ALT 0 - 44 U/L '22 23 23    ' All questions were answered to patient's stated satisfaction. Encouraged patient to call with any new concerns or questions before his next visit to the cancer center and we can certain see him sooner, if needed.     ASSESSMENT & PLAN:  Invasive ductal carcinoma metastasized to axillary lymph node, left 1.  Left breast cancer: -Right modified radical mastectomy on 07/17/2013, 1/12 lymph nodes positive, grade 2, 1.4 cm invasive tumor, margins negative, ER 74% positive, PR 16% positive, HER-2 negative, Ki 6752%, PT1CPN1A.  Her 2/NEU was positive on testing by [facility] on lymph nodes. -6 cycles of TC CH from 08/27/2013 through 12/18/2013, 1 year Herceptin completed on 08/28/2014. -Status post XRT completed in July 2015. -Anastrozole started in August 2015. -Plan is to continue anastrozole beyond 5 years up to 10 years.  She is tolerating it very well with occasional hot flashes. -Last mammogram on 07/29/2019 was B RADS category 1. -Labs done on 01/28/2020 were all WNL. -We will see her back in 6 months with repeat labs and mammogram.  2.  Osteoporosis: -DEXA scan from 01/09/2019 shows T score of -2.6.  This is slightly worse from prior scans of -2.5 and -2.4 prior to that. -She temporarily received Prolia on 10/09/2014, 04/09/2015, 09/28/2015.  This was discontinued secondary to high insurance cost. -She is also taken Fosamax, but unsure why she discontinued it. -She was started back on Fosamax weekly on 01/30/2019.  She is taking and tolerating well. -He is taking calcium 1200 mg and vitamin D 2000 units daily. -We will see her back in 6 months with repeat labs.  3.  Vitamin D deficiency: -Initial vitamin D level was 27.59. -We have increased her vitamin D to 2000 units daily. -Labs  done on 01/28/2020 showed her vitamin D level has increased to 49.86      Orders placed this encounter:  Orders Placed This Encounter  Procedures  . MM 3D SCREEN BREAST BILATERAL  . Lactate dehydrogenase  . CBC with Differential/Platelet  . Comprehensive metabolic panel  . Vitamin B12  . VITAMIN D 25 Hydroxy (Vit-D Deficiency, Fractures)      Francene Finders, FNP-C [place] [phone]

## 2020-02-04 NOTE — Patient Instructions (Signed)
[place] at [facility] Discharge Instructions  Follow up in 6 months with labs and mammogram   Thank you for choosing [place] at [facility] to provide your oncology and hematology care.  To afford each patient quality time with our provider, please arrive at least 15 minutes before your scheduled appointment time.   If you have a lab appointment with the [facility] please come in thru the Main Entrance and check in at the main information desk.  You need to re-schedule your appointment should you arrive 10 or more minutes late.  We strive to give you quality time with our providers, and arriving late affects you and other patients whose appointments are after yours.  Also, if you no show three or more times for appointments you may be dismissed from the clinic at the providers discretion.     Again, thank you for choosing [facility].  Our hope is that these requests will decrease the amount of time that you wait before being seen by our physicians.       _____________________________________________________________  Should you have questions after your visit to [facility], please contact our office at [phone] between the hours of 8:00 a.m. and 4:30 p.m.  Voicemails left after 4:00 p.m. will not be returned until the following business day.  For prescription refill requests, have your pharmacy contact our office and allow 72 hours.    Due to Covid, you will need to wear a mask upon entering the hospital. If you do not have a mask, a mask will be given to you at the Main Entrance upon arrival. For doctor visits, patients may have 1 support person with them. For treatment visits, patients can not have anyone with them due to social distancing guidelines and our immunocompromised population.

## 2020-02-04 NOTE — Assessment & Plan Note (Signed)
1.  Left breast cancer: -Right modified radical mastectomy on 07/17/2013, 1/12 lymph nodes positive, grade 2, 1.4 cm invasive tumor, margins negative, ER 74% positive, PR 16% positive, HER-2 negative, Ki 6752%, PT1CPN1A.  Her 2/NEU was positive on testing by [facility] on lymph nodes. -6 cycles of TC CH from 08/27/2013 through 12/18/2013, 1 year Herceptin completed on 08/28/2014. -Status post XRT completed in July 2015. -Anastrozole started in August 2015. -Plan is to continue anastrozole beyond 5 years up to 10 years.  She is tolerating it very well with occasional hot flashes. -Last mammogram on 07/29/2019 was B RADS category 1. -Labs done on 01/28/2020 were all WNL. -We will see her back in 6 months with repeat labs and mammogram.  2.  Osteoporosis: -DEXA scan from 01/09/2019 shows T score of -2.6.  This is slightly worse from prior scans of -2.5 and -2.4 prior to that. -She temporarily received Prolia on 10/09/2014, 04/09/2015, 09/28/2015.  This was discontinued secondary to high insurance cost. -She is also taken Fosamax, but unsure why she discontinued it. -She was started back on Fosamax weekly on 01/30/2019.  She is taking and tolerating well. -He is taking calcium 1200 mg and vitamin D 2000 units daily. -We will see her back in 6 months with repeat labs.  3.  Vitamin D deficiency: -Initial vitamin D level was 27.59. -We have increased her vitamin D to 2000 units daily. -Labs done on 01/28/2020 showed her vitamin D level has increased to 49.86

## 2020-02-06 DIAGNOSIS — M1990 Unspecified osteoarthritis, unspecified site: Secondary | ICD-10-CM | POA: Diagnosis not present

## 2020-02-06 DIAGNOSIS — M79671 Pain in right foot: Secondary | ICD-10-CM | POA: Diagnosis not present

## 2020-02-06 DIAGNOSIS — G5750 Tarsal tunnel syndrome, unspecified lower limb: Secondary | ICD-10-CM | POA: Diagnosis not present

## 2020-02-09 ENCOUNTER — Other Ambulatory Visit (HOSPITAL_COMMUNITY): Payer: Self-pay | Admitting: Hematology

## 2020-02-09 DIAGNOSIS — M810 Age-related osteoporosis without current pathological fracture: Secondary | ICD-10-CM

## 2020-03-05 DIAGNOSIS — G5750 Tarsal tunnel syndrome, unspecified lower limb: Secondary | ICD-10-CM | POA: Diagnosis not present

## 2020-03-05 DIAGNOSIS — M79671 Pain in right foot: Secondary | ICD-10-CM | POA: Diagnosis not present

## 2020-04-06 ENCOUNTER — Other Ambulatory Visit (HOSPITAL_COMMUNITY): Payer: Self-pay | Admitting: Hematology

## 2020-04-06 DIAGNOSIS — C50912 Malignant neoplasm of unspecified site of left female breast: Secondary | ICD-10-CM

## 2020-05-07 DIAGNOSIS — M79671 Pain in right foot: Secondary | ICD-10-CM | POA: Diagnosis not present

## 2020-05-07 DIAGNOSIS — G5750 Tarsal tunnel syndrome, unspecified lower limb: Secondary | ICD-10-CM | POA: Diagnosis not present

## 2020-07-02 DIAGNOSIS — L03031 Cellulitis of right toe: Secondary | ICD-10-CM | POA: Diagnosis not present

## 2020-07-02 DIAGNOSIS — L600 Ingrowing nail: Secondary | ICD-10-CM | POA: Diagnosis not present

## 2020-07-02 DIAGNOSIS — M79674 Pain in right toe(s): Secondary | ICD-10-CM | POA: Diagnosis not present

## 2020-07-02 DIAGNOSIS — M79671 Pain in right foot: Secondary | ICD-10-CM | POA: Diagnosis not present

## 2020-07-09 ENCOUNTER — Other Ambulatory Visit (HOSPITAL_COMMUNITY): Payer: Self-pay | Admitting: Hematology

## 2020-07-09 DIAGNOSIS — Z1231 Encounter for screening mammogram for malignant neoplasm of breast: Secondary | ICD-10-CM

## 2020-07-12 ENCOUNTER — Ambulatory Visit
Admission: EM | Admit: 2020-07-12 | Discharge: 2020-07-12 | Disposition: A | Discharge status: Home / Self Care | Payer: Medicare Other | Attending: Emergency Medicine | Admitting: Emergency Medicine

## 2020-07-12 ENCOUNTER — Other Ambulatory Visit: Payer: Self-pay

## 2020-07-12 DIAGNOSIS — R0981 Nasal congestion: Secondary | ICD-10-CM | POA: Diagnosis not present

## 2020-07-12 DIAGNOSIS — J0120 Acute ethmoidal sinusitis, unspecified: Secondary | ICD-10-CM

## 2020-07-12 MED ORDER — DEXAMETHASONE SODIUM PHOSPHATE 10 MG/ML IJ SOLN
10.0000 mg | Freq: Once | INTRAMUSCULAR | Status: AC
  Administered 2020-07-12: 10 mg via INTRAMUSCULAR

## 2020-07-12 MED ORDER — AMOXICILLIN-POT CLAVULANATE 875-125 MG PO TABS: 1.0000 | ORAL_TABLET | Freq: Two times a day (BID) | ORAL | 0 refills | Status: AC

## 2020-07-12 MED ORDER — PREDNISONE 10 MG (21) PO TBPK: ORAL_TABLET | Freq: Every day | ORAL | 0 refills | Status: DC

## 2020-07-12 NOTE — ED Provider Notes (Signed)
Heather Vaughan   [id number] 07/12/20 Arrival Time: 80   CC: Sinus congestion  SUBJECTIVE: History from: patient.  Heather Vaughan is a 64 y.o. female who presents with nasal congestion and sinus pain/ pressure x 1 week.  Denies sick exposure to COVID, flu or strep.   Has tried OTC medications without relief.  Symptoms are made worse at night.  Reports previous symptoms in the past.   Denies fever, chills, cough, SOB, wheezing, chest pain, nausea, changes in bowel or bladder habits.     ROS: As per HPI.  All other pertinent ROS negative.     Past Medical History:  Diagnosis Date   Anxiety    B12 deficiency 01/27/2016   Barrett's esophagus    Breast cancer ([place])    Breast cancer, left breast ([place])    Bronchitis 07/27/2011   GERD (gastroesophageal reflux disease)    Hypertension    Osteopenia 10/09/2014   Past Surgical History:  Procedure Laterality Date   BREAST BIOPSY Bilateral    X 4- benign   ESOPHAGOGASTRODUODENOSCOPY  08/03/2011   Procedure: ESOPHAGOGASTRODUODENOSCOPY (EGD);  Surgeon: Rogene Houston, MD;  [facility];  Service: Endoscopy;  Laterality: N/A;  12:45   EXCISION MORTON'S NEUROMA  07/14/2011   Procedure: EXCISION MORTON'S NEUROMA;  Surgeon: Marcheta Grammes;  Location: [facility] ORS;  Service: Orthopedics;  Laterality: Right;  Removal Neuroma 3rd Interspace Right Foot   MASTECTOMY Left    MASTECTOMY MODIFIED RADICAL Left 07/17/2013   Procedure: MASTECTOMY MODIFIED RADICAL WITH LEFT AXILLARY TISSUE REMOVAL;  Surgeon: Scherry Ran, MD;  Location: [facility] ORS;  Service: General;  Laterality: Left;   PARTIAL MASTECTOMY WITH NEEDLE LOCALIZATION Left 07/03/2013   Procedure: PARTIAL MASTECTOMY STATUS POST NEEDLE LOCALIZATION;  Surgeon: Scherry Ran, MD;  Location: [facility] ORS;  Service: General;  Laterality: Left;   PORT-A-CATH REMOVAL Right 04/27/2015   Procedure: REMOVAL PORT-A-CATH;  Surgeon: Aviva Signs, MD;  Location: [facility] ORS;   Service: General;  Laterality: Right;   PORTACATH PLACEMENT Right 08/16/2013   Procedure: INSERTION PORT-A-CATH RIGHT SUBCLAVIAN;  Surgeon: Scherry Ran, MD;  Location: [facility] ORS;  Service: General;  Laterality: Right;   No Known Allergies No current facility-administered medications on file prior to encounter.   Current Outpatient Medications on File Prior to Encounter  Medication Sig Dispense Refill   alendronate (FOSAMAX) 70 MG tablet TAKE 1 TABLET(70 MG) BY MOUTH 1 TIME A WEEK WITH A FULL GLASS OF WATER AND ON AN EMPTY STOMACH 4 tablet 6   anastrozole (ARIMIDEX) 1 MG tablet TAKE 1 TABLET(1 MG) BY MOUTH DAILY 30 tablet 6   Calcium Carbonate-Vitamin D (CALCIUM-VITAMIN D3 PO) Take by mouth.     DULoxetine (CYMBALTA) 30 MG capsule Take 30 mg by mouth daily.     hydrochlorothiazide (HYDRODIURIL) 25 MG tablet      losartan (COZAAR) 100 MG tablet      omeprazole (PRILOSEC) 20 MG capsule Take 20 mg by mouth 2 (two) times daily.      traZODone (DESYREL) 50 MG tablet Take 25 mg by mouth at bedtime as needed for sleep. For sleep     Social History   Socioeconomic History   Marital status: Married    Spouse name: Not on file   Number of children: Not on file   Years of education: Not on file   Highest education level: Not on file  Occupational History   Not on file  Tobacco Use   Smoking status: Former  Smoker    Packs/day: 1.00    Years: 20.00    Pack years: 20.00    Types: Cigarettes    Quit date: 09/05/1993    Years since quitting: 26.8   Smokeless tobacco: Never Used  Substance and Sexual Activity   Alcohol use: No    Comment: occasionally   Drug use: No   Sexual activity: Not on file  Other Topics Concern   Not on file  Social History Narrative   Not on file   Social Determinants of Health   Financial Resource Strain:    Difficulty of Paying Living Expenses: Not on file  Food Insecurity:    Worried About [place] in the Last Year:  Not on file   [employer] of Food in the Last Year: Not on file  Transportation Needs:    Lack of Transportation (Medical): Not on file   Lack of Transportation (Non-Medical): Not on file  Physical Activity:    Days of Exercise per Week: Not on file   Minutes of Exercise per Session: Not on file  Stress:    Feeling of Stress : Not on file  Social Connections:    Frequency of Communication with Friends and Family: Not on file   Frequency of Social Gatherings with Friends and Family: Not on file   Attends Religious Services: Not on file   Active Member of Clubs or Organizations: Not on file   Attends Archivist Meetings: Not on file   Marital Status: Not on file  Intimate Partner Violence:    Fear of Current or Ex-Partner: Not on file   Emotionally Abused: Not on file   Physically Abused: Not on file   Sexually Abused: Not on file   Family History  Problem Relation Age of Onset   Anesthesia problems Neg Hx    Hypotension Neg Hx    Malignant hyperthermia Neg Hx    Pseudochol deficiency Neg Hx     OBJECTIVE:  Vitals:   07/12/20 1209  BP: 137/77  Pulse: (!) 104  Resp: 18  Temp: 98.4 F (36.9 C)  SpO2: 99%     General appearance: alert; appears fatigued, but nontoxic; speaking in full sentences and tolerating own secretions HEENT: NCAT; Ears: EACs clear, TMs pearly gray; Eyes: PERRL.  EOM grossly intact. Sinuses: TTP; Nose: nares patent without rhinorrhea, Throat: oropharynx clear, tonsils non erythematous or enlarged, uvula midline  Neck: supple without LAD Lungs: unlabored respirations, symmetrical air entry; cough: absent; no respiratory distress; CTAB Heart: regular rate and rhythm.   Skin: warm and dry Psychological: alert and cooperative; normal mood and affect  LABS:  No results found for this or any previous visit (from the past 24 hour(s)).   ASSESSMENT & PLAN:  1. Sinus congestion   2. Acute non-recurrent ethmoidal sinusitis      Meds ordered this encounter  Medications   predniSONE (STERAPRED UNI-PAK 21 TAB) 10 MG (21) TBPK tablet    Sig: Take by mouth daily. Take 6 tabs by mouth daily  for 2 days, then 5 tabs for 2 days, then 4 tabs for 2 days, then 3 tabs for 2 days, 2 tabs for 2 days, then 1 tab by mouth daily for 2 days    Dispense:  42 tablet    Refill:  0    Order Specific Question:   Supervising Provider    Answer:   Raylene Everts [[id number]]   amoxicillin-clavulanate (AUGMENTIN) 875-125 MG tablet  Sig: Take 1 tablet by mouth every 12 (twelve) hours for 10 days.    Dispense:  20 tablet    Refill:  0    Order Specific Question:   Supervising Provider    Answer:   Raylene Everts [[id number]]   dexamethasone (DECADRON) injection 10 mg    Get plenty of rest and push fluids Steroid shot  Prednisone prescribed.  Take as directed and to completion Augmentin prescribed.  Take as directed and to completion Use OTC zyrtec for nasal congestion, runny nose, and/or sore throat Use OTC flonase for nasal congestion and runny nose Use medications daily for symptom relief Use OTC medications like ibuprofen or tylenol as needed fever or pain Call or go to the ED if you have any new or worsening symptoms such as fever, cough, shortness of breath, chest tightness, chest pain, turning blue, changes in mental status, etc...   Reviewed expectations re: course of current medical issues. Questions answered. Outlined signs and symptoms indicating need for more acute intervention. Patient verbalized understanding. After Visit Summary given.         Lestine Box, PA-C 07/12/20 1234

## 2020-07-12 NOTE — ED Triage Notes (Signed)
Pt presents with c/o nasal congestion for over a week with sinus pressure

## 2020-07-12 NOTE — Discharge Instructions (Signed)
Get plenty of rest and push fluids Steroid shot  Prednisone prescribed.  Take as directed and to completion Augmentin prescribed.  Take as directed and to completion Use OTC zyrtec for nasal congestion, runny nose, and/or sore throat Use OTC flonase for nasal congestion and runny nose Use medications daily for symptom relief Use OTC medications like ibuprofen or tylenol as needed fever or pain Call or go to the ED if you have any new or worsening symptoms such as fever, cough, shortness of breath, chest tightness, chest pain, turning blue, changes in mental status, etc..Marland Kitchen

## 2020-07-16 DIAGNOSIS — M79674 Pain in right toe(s): Secondary | ICD-10-CM | POA: Diagnosis not present

## 2020-07-16 DIAGNOSIS — L600 Ingrowing nail: Secondary | ICD-10-CM | POA: Diagnosis not present

## 2020-07-16 DIAGNOSIS — M79671 Pain in right foot: Secondary | ICD-10-CM | POA: Diagnosis not present

## 2020-07-30 ENCOUNTER — Other Ambulatory Visit (HOSPITAL_COMMUNITY): Payer: Self-pay

## 2020-07-30 DIAGNOSIS — L600 Ingrowing nail: Secondary | ICD-10-CM | POA: Diagnosis not present

## 2020-07-30 DIAGNOSIS — M79671 Pain in right foot: Secondary | ICD-10-CM | POA: Diagnosis not present

## 2020-07-30 DIAGNOSIS — C50919 Malignant neoplasm of unspecified site of unspecified female breast: Secondary | ICD-10-CM

## 2020-07-30 DIAGNOSIS — M79674 Pain in right toe(s): Secondary | ICD-10-CM | POA: Diagnosis not present

## 2020-07-31 ENCOUNTER — Ambulatory Visit (HOSPITAL_COMMUNITY): Payer: Medicare Other

## 2020-07-31 ENCOUNTER — Inpatient Hospital Stay (HOSPITAL_COMMUNITY): Payer: Medicare Other | Attending: Hematology

## 2020-07-31 ENCOUNTER — Other Ambulatory Visit: Payer: Self-pay

## 2020-07-31 DIAGNOSIS — I10 Essential (primary) hypertension: Secondary | ICD-10-CM | POA: Insufficient documentation

## 2020-07-31 DIAGNOSIS — E559 Vitamin D deficiency, unspecified: Secondary | ICD-10-CM | POA: Insufficient documentation

## 2020-07-31 DIAGNOSIS — Z923 Personal history of irradiation: Secondary | ICD-10-CM | POA: Diagnosis not present

## 2020-07-31 DIAGNOSIS — M810 Age-related osteoporosis without current pathological fracture: Secondary | ICD-10-CM | POA: Insufficient documentation

## 2020-07-31 DIAGNOSIS — E538 Deficiency of other specified B group vitamins: Secondary | ICD-10-CM | POA: Insufficient documentation

## 2020-07-31 DIAGNOSIS — C50919 Malignant neoplasm of unspecified site of unspecified female breast: Secondary | ICD-10-CM

## 2020-07-31 DIAGNOSIS — Z170 Estrogen receptor positive status [ER+]: Secondary | ICD-10-CM | POA: Diagnosis not present

## 2020-07-31 DIAGNOSIS — C773 Secondary and unspecified malignant neoplasm of axilla and upper limb lymph nodes: Secondary | ICD-10-CM | POA: Diagnosis not present

## 2020-07-31 DIAGNOSIS — R232 Flushing: Secondary | ICD-10-CM | POA: Insufficient documentation

## 2020-07-31 DIAGNOSIS — M8580 Other specified disorders of bone density and structure, unspecified site: Secondary | ICD-10-CM | POA: Insufficient documentation

## 2020-07-31 DIAGNOSIS — Z87891 Personal history of nicotine dependence: Secondary | ICD-10-CM | POA: Diagnosis not present

## 2020-07-31 DIAGNOSIS — Z79811 Long term (current) use of aromatase inhibitors: Secondary | ICD-10-CM | POA: Diagnosis not present

## 2020-07-31 DIAGNOSIS — C50912 Malignant neoplasm of unspecified site of left female breast: Secondary | ICD-10-CM | POA: Insufficient documentation

## 2020-07-31 DIAGNOSIS — I700 Atherosclerosis of aorta: Secondary | ICD-10-CM | POA: Diagnosis not present

## 2020-07-31 DIAGNOSIS — Z9221 Personal history of antineoplastic chemotherapy: Secondary | ICD-10-CM | POA: Insufficient documentation

## 2020-07-31 DIAGNOSIS — Z79899 Other long term (current) drug therapy: Secondary | ICD-10-CM | POA: Insufficient documentation

## 2020-07-31 DIAGNOSIS — K219 Gastro-esophageal reflux disease without esophagitis: Secondary | ICD-10-CM | POA: Diagnosis not present

## 2020-07-31 DIAGNOSIS — F419 Anxiety disorder, unspecified: Secondary | ICD-10-CM | POA: Diagnosis not present

## 2020-07-31 LAB — CBC WITH DIFFERENTIAL/PLATELET
Abs Immature Granulocytes: 0.01 10*3/uL (ref 0.00–0.07)
Basophils Absolute: 0 10*3/uL (ref 0.0–0.1)
Basophils Relative: 0 %
Eosinophils Absolute: 0.2 10*3/uL (ref 0.0–0.5)
Eosinophils Relative: 3 %
HCT: 37.8 % (ref 36.0–46.0)
Hemoglobin: 12.2 g/dL (ref 12.0–15.0)
Immature Granulocytes: 0 %
Lymphocytes Relative: 31 %
Lymphs Abs: 1.6 10*3/uL (ref 0.7–4.0)
MCH: 34.2 pg — ABNORMAL HIGH (ref 26.0–34.0)
MCHC: 32.3 g/dL (ref 30.0–36.0)
MCV: 105.9 fL — ABNORMAL HIGH (ref 80.0–100.0)
Monocytes Absolute: 0.4 10*3/uL (ref 0.1–1.0)
Monocytes Relative: 8 %
Neutro Abs: 3 10*3/uL (ref 1.7–7.7)
Neutrophils Relative %: 58 %
Platelets: 156 10*3/uL (ref 150–400)
RBC: 3.57 MIL/uL — ABNORMAL LOW (ref 3.87–5.11)
RDW: 12.2 % (ref 11.5–15.5)
WBC: 5.2 10*3/uL (ref 4.0–10.5)
nRBC: 0 % (ref 0.0–0.2)

## 2020-07-31 LAB — COMPREHENSIVE METABOLIC PANEL
ALT: 25 U/L (ref 0–44)
AST: 18 U/L (ref 15–41)
Albumin: 3.8 g/dL (ref 3.5–5.0)
Alkaline Phosphatase: 55 U/L (ref 38–126)
Anion gap: 8 (ref 5–15)
BUN: 14 mg/dL (ref 8–23)
CO2: 28 mmol/L (ref 22–32)
Calcium: 8.9 mg/dL (ref 8.9–10.3)
Chloride: 103 mmol/L (ref 98–111)
Creatinine, Ser: 0.71 mg/dL (ref 0.44–1.00)
GFR, Estimated: >60 mL/min (ref >60)
Glucose, Bld: 101 mg/dL — ABNORMAL HIGH (ref 70–99)
Potassium: 3.7 mmol/L (ref 3.5–5.1)
Sodium: 139 mmol/L (ref 135–145)
Total Bilirubin: 0.6 mg/dL (ref 0.3–1.2)
Total Protein: 6.5 g/dL (ref 6.5–8.1)

## 2020-07-31 LAB — LACTATE DEHYDROGENASE: LDH: 164 U/L (ref 98–192)

## 2020-07-31 LAB — VITAMIN D 25 HYDROXY (VIT D DEFICIENCY, FRACTURES): Vit D, 25-Hydroxy: 46 ng/mL (ref 30–100)

## 2020-07-31 LAB — VITAMIN B12: Vitamin B-12: 246 pg/mL (ref 180–914)

## 2020-08-04 ENCOUNTER — Ambulatory Visit (HOSPITAL_COMMUNITY): Payer: Medicare Other | Admitting: Oncology

## 2020-08-05 ENCOUNTER — Other Ambulatory Visit: Payer: Self-pay

## 2020-08-05 ENCOUNTER — Ambulatory Visit (HOSPITAL_COMMUNITY)
Admission: RE | Admit: 2020-08-05 | Discharge: 2020-08-05 | Disposition: A | Discharge status: Home / Self Care | Payer: Medicare Other | Source: Ambulatory Visit | Attending: Hematology | Admitting: Hematology

## 2020-08-05 DIAGNOSIS — Z1231 Encounter for screening mammogram for malignant neoplasm of breast: Secondary | ICD-10-CM | POA: Diagnosis not present

## 2020-08-19 ENCOUNTER — Other Ambulatory Visit (HOSPITAL_COMMUNITY): Payer: Self-pay | Admitting: Hematology

## 2020-08-19 DIAGNOSIS — M810 Age-related osteoporosis without current pathological fracture: Secondary | ICD-10-CM

## 2020-08-20 ENCOUNTER — Other Ambulatory Visit (HOSPITAL_COMMUNITY): Payer: Self-pay

## 2020-08-27 ENCOUNTER — Other Ambulatory Visit: Payer: Self-pay

## 2020-08-27 ENCOUNTER — Inpatient Hospital Stay (HOSPITAL_COMMUNITY): Payer: Medicare Other | Admitting: Oncology

## 2020-08-27 VITALS — BP 145/79 | HR 86 | Temp 97.6°F | Resp 18 | Wt 150.5 lb

## 2020-08-27 DIAGNOSIS — I10 Essential (primary) hypertension: Secondary | ICD-10-CM | POA: Diagnosis not present

## 2020-08-27 DIAGNOSIS — E538 Deficiency of other specified B group vitamins: Secondary | ICD-10-CM | POA: Diagnosis not present

## 2020-08-27 DIAGNOSIS — Z923 Personal history of irradiation: Secondary | ICD-10-CM | POA: Diagnosis not present

## 2020-08-27 DIAGNOSIS — Z79811 Long term (current) use of aromatase inhibitors: Secondary | ICD-10-CM | POA: Diagnosis not present

## 2020-08-27 DIAGNOSIS — C50912 Malignant neoplasm of unspecified site of left female breast: Secondary | ICD-10-CM | POA: Diagnosis not present

## 2020-08-27 DIAGNOSIS — M810 Age-related osteoporosis without current pathological fracture: Secondary | ICD-10-CM | POA: Diagnosis not present

## 2020-08-27 DIAGNOSIS — Z9221 Personal history of antineoplastic chemotherapy: Secondary | ICD-10-CM | POA: Diagnosis not present

## 2020-08-27 DIAGNOSIS — E559 Vitamin D deficiency, unspecified: Secondary | ICD-10-CM | POA: Diagnosis not present

## 2020-08-27 DIAGNOSIS — C773 Secondary and unspecified malignant neoplasm of axilla and upper limb lymph nodes: Secondary | ICD-10-CM | POA: Diagnosis not present

## 2020-08-27 DIAGNOSIS — M8580 Other specified disorders of bone density and structure, unspecified site: Secondary | ICD-10-CM | POA: Diagnosis not present

## 2020-08-27 DIAGNOSIS — Z79899 Other long term (current) drug therapy: Secondary | ICD-10-CM | POA: Diagnosis not present

## 2020-08-27 DIAGNOSIS — Z170 Estrogen receptor positive status [ER+]: Secondary | ICD-10-CM | POA: Diagnosis not present

## 2020-08-27 DIAGNOSIS — R232 Flushing: Secondary | ICD-10-CM | POA: Diagnosis not present

## 2020-08-27 DIAGNOSIS — Z87891 Personal history of nicotine dependence: Secondary | ICD-10-CM | POA: Diagnosis not present

## 2020-08-27 DIAGNOSIS — K219 Gastro-esophageal reflux disease without esophagitis: Secondary | ICD-10-CM | POA: Diagnosis not present

## 2020-08-27 DIAGNOSIS — I700 Atherosclerosis of aorta: Secondary | ICD-10-CM | POA: Diagnosis not present

## 2020-08-27 NOTE — Progress Notes (Signed)
Heather Vaughan,  [id number]   CLINIC:  Medical Oncology/Hematology  PCP:  Scherrie Bateman [address] [place] [address] [phone]   REASON FOR VISIT: Follow-up for breast cancer   CURRENT THERAPY: Arimidex  BRIEF ONCOLOGIC HISTORY:  Oncology History  Invasive ductal carcinoma metastasized to axillary lymph node, left  05/28/2013 Imaging   Screening mammogram   06/19/2013 Imaging   Left diagnostic mammogram   06/25/2013 Imaging   CT CAP- borderline enlarged axillary lymph node on left.  Small pulmonary nodules noted but too small for PET or biopsy; surveillance recommended.    07/03/2013 Surgery   Left partial mastectomy.  Invasive ductal carcinoma. 1.4 cm.  POSITIVE MARGINS.  Additional extensive DCIS noted.  ER 74%, PR 16%, Ki-67 52%, HER2 NEGATIVE.   07/17/2013 Surgery   Left modified radiacl mastectomy with left axillary node dissection.  Negative residual cancer.  Negative inked margins.  1/12 positive lymph nodes.  FINAL MARGINS NEGATIVE.  LYMPH NODE IS HER2 POSITIVE.   08/13/2013 Imaging   MUGA scan shows left ventricular EF of 62%   08/27/2013 - 12/18/2013 Chemotherapy   TCH x 6 cycles   01/07/2014 -  Chemotherapy   Herceptin x 52 weeks   01/27/2014 - 03/13/2014 Radiation Therapy   By Dr. Pablo Ledger   03/29/2014 -  Anti-estrogen oral therapy   Aromasin   10/24/2014 Imaging   Bone density- BMD as determined from Femur Neck Right is 0.698 g/cm2 with a T-Score of -2.4. This patient is considered osteopenic according to [facility]) criteria. (Lumbar spine was not utilized due to advanced degenerative changes).   11/05/2014 Imaging   MUGA- Left ventricular ejection fraction equals 70%. No change from prior.   04/27/2015 Procedure   Port-A-Cath removal by Dr. Arnoldo Morale   06/19/2015 Imaging   CT chest- Status post left mastectomy, without recurrent or metastatic disease. Decrease in size  of right-sided pulmonary nodules, favoring an infectious or inflammatory etiology.   06/21/2017 Imaging   CT Chest w contrast: IMPRESSION: 1. No evidence of metastatic disease in the thorax. Multiple previously noted tiny pulmonary nodules are stable compared to prior studies and considered benign. 2. Aortic atherosclerosis. 3. Mild diffuse bronchial wall thickening with mild centrilobular and paraseptal emphysema most apparent the lung apices; imaging findings that may suggest underlying COPD.     CANCER STAGING: Cancer Staging Invasive ductal carcinoma metastasized to axillary lymph node, left Staging form: Breast, AJCC 7th Edition - Clinical: Stage IIA (T1c, N1, cM0) - Signed by Baird Cancer, PA-C on 09/15/2013   INTERVAL HISTORY:  Heather Vaughan 64 y.o. female returns for routine follow-up for breast cancer.  Patient reports she is doing well since her last visit.  She denies any new lumps or bumps present.  She denies any new bone pain.  She is taking her medication as prescribed.  She has no unwanted side effects. Denies any nausea, vomiting, or diarrhea. Denies any new pains. Had not noticed any recent bleeding such as epistaxis, hematuria or hematochezia. Denies recent chest pain on exertion, shortness of breath on minimal exertion, pre-syncopal episodes, or palpitations. Denies any numbness or tingling in hands or feet. Denies any recent fevers, infections, or recent hospitalizations. Patient reports appetite at 100% and energy level at 100%.  She is eating well maintain her weight at this time.   REVIEW OF SYSTEMS:  Review of Systems  All other systems reviewed and are negative.   PAST  MEDICAL/SURGICAL HISTORY:  Past Medical History:  Diagnosis Date  . Anxiety   . B12 deficiency 01/27/2016  . Barrett's esophagus   . Breast cancer ([place])   . Breast cancer, left breast ([place])   . Bronchitis 07/27/2011  . GERD (gastroesophageal reflux disease)   . Hypertension   .  Osteopenia 10/09/2014   Past Surgical History:  Procedure Laterality Date  . BREAST BIOPSY Bilateral    X 4- benign  . ESOPHAGOGASTRODUODENOSCOPY  08/03/2011   Procedure: ESOPHAGOGASTRODUODENOSCOPY (EGD);  Surgeon: Rogene Houston, MD;  [facility];  Service: Endoscopy;  Laterality: N/A;  12:45  . EXCISION MORTON'S NEUROMA  07/14/2011   Procedure: EXCISION MORTON'S NEUROMA;  Surgeon: Marcheta Grammes;  Location: [facility] ORS;  Service: Orthopedics;  Laterality: Right;  Removal Neuroma 3rd Interspace Right Foot  . MASTECTOMY Left   . MASTECTOMY MODIFIED RADICAL Left 07/17/2013   Procedure: MASTECTOMY MODIFIED RADICAL WITH LEFT AXILLARY TISSUE REMOVAL;  Surgeon: Scherry Ran, MD;  Location: [facility];  Service: General;  Laterality: Left;  . PARTIAL MASTECTOMY WITH NEEDLE LOCALIZATION Left 07/03/2013   Procedure: PARTIAL MASTECTOMY STATUS POST NEEDLE LOCALIZATION;  Surgeon: Scherry Ran, MD;  Location: [facility];  Service: General;  Laterality: Left;  . PORT-A-CATH REMOVAL Right 04/27/2015   Procedure: REMOVAL PORT-A-CATH;  Surgeon: Aviva Signs, MD;  Location: [facility];  Service: General;  Laterality: Right;  . PORTACATH PLACEMENT Right 08/16/2013   Procedure: INSERTION PORT-A-CATH RIGHT SUBCLAVIAN;  Surgeon: Scherry Ran, MD;  Location: [facility];  Service: General;  Laterality: Right;     SOCIAL HISTORY:  Social History   Socioeconomic History  . Marital status: Married    Spouse name: Not on file  . Number of children: Not on file  . Years of education: Not on file  . Highest education level: Not on file  Occupational History  . Not on file  Tobacco Use  . Smoking status: Former Smoker    Packs/day: 1.00    Years: 20.00    Pack years: 20.00    Types: Cigarettes    Quit date: 09/05/1993    Years since quitting: 26.9  . Smokeless tobacco: Never Used  Substance and Sexual Activity  . Alcohol use: No    Comment: occasionally  . Drug use: No  . Sexual activity:  Not on file  Other Topics Concern  . Not on file  Social History Narrative  . Not on file   Social Determinants of Health   Financial Resource Strain: Not on file  Food Insecurity: Not on file  Transportation Needs: Not on file  Physical Activity: Not on file  Stress: Not on file  Social Connections: Not on file  Intimate Partner Violence: Not on file    FAMILY HISTORY:  Family History  Problem Relation Age of Onset  . Anesthesia problems Neg Hx   . Hypotension Neg Hx   . Malignant hyperthermia Neg Hx   . Pseudochol deficiency Neg Hx     CURRENT MEDICATIONS:  Outpatient Encounter Medications as of 08/27/2020  Medication Sig Note  . alendronate (FOSAMAX) 70 MG tablet TAKE 1 TABLET(70 MG) BY MOUTH 1 TIME A WEEK WITH A FULL GLASS OF WATER AND ON AN EMPTY STOMACH   . anastrozole (ARIMIDEX) 1 MG tablet TAKE 1 TABLET(1 MG) BY MOUTH DAILY   . Calcium Carbonate-Vitamin D (CALCIUM-VITAMIN D3 PO) Take by mouth.   . DULoxetine (CYMBALTA) 30 MG capsule Take 30 mg by mouth daily.   Marland Kitchen  hydrochlorothiazide (HYDRODIURIL) 25 MG tablet    . losartan (COZAAR) 100 MG tablet    . omeprazole (PRILOSEC) 20 MG capsule Take 20 mg by mouth 2 (two) times daily.  06/26/2015: Received from: External Pharmacy  . predniSONE (STERAPRED UNI-PAK 21 TAB) 10 MG (21) TBPK tablet Take by mouth daily. Take 6 tabs by mouth daily  for 2 days, then 5 tabs for 2 days, then 4 tabs for 2 days, then 3 tabs for 2 days, 2 tabs for 2 days, then 1 tab by mouth daily for 2 days   . traZODone (DESYREL) 50 MG tablet Take 25 mg by mouth at bedtime as needed for sleep. For sleep 07/16/2013: ---   No facility-administered encounter medications on file as of 08/27/2020.    ALLERGIES:  No Known Allergies   PHYSICAL EXAM:  ECOG Performance status: 1  There were no vitals filed for this visit. There were no vitals filed for this visit. Physical Exam Constitutional:      Appearance: Normal appearance. She is normal weight.   Cardiovascular:     Rate and Rhythm: Normal rate and regular rhythm.     Heart sounds: Normal heart sounds.  Pulmonary:     Effort: Pulmonary effort is normal.     Breath sounds: Normal breath sounds.  Abdominal:     General: Bowel sounds are normal.     Palpations: Abdomen is soft.  Musculoskeletal:        General: Normal range of motion.  Skin:    General: Skin is warm.  Neurological:     Mental Status: She is alert and oriented to person, place, and time. Mental status is at baseline.  Psychiatric:        Mood and Affect: Mood normal.        Behavior: Behavior normal.        Thought Content: Thought content normal.        Judgment: Judgment normal.      LABORATORY DATA:  I have reviewed the labs as listed.  CBC    Component Value Date/Time   WBC 5.2 07/31/2020 1011   RBC 3.57 (L) 07/31/2020 1011   HGB 12.2 07/31/2020 1011   HCT 37.8 07/31/2020 1011   PLT 156 07/31/2020 1011   MCV 105.9 (H) 07/31/2020 1011   MCH 34.2 (H) 07/31/2020 1011   MCHC 32.3 07/31/2020 1011   RDW 12.2 07/31/2020 1011   LYMPHSABS 1.6 07/31/2020 1011   MONOABS 0.4 07/31/2020 1011   EOSABS 0.2 07/31/2020 1011   BASOSABS 0.0 07/31/2020 1011   CMP Latest Ref Rng & Units 07/31/2020 01/28/2020 07/29/2019  Glucose 70 - 99 mg/dL 101(H) 117(H) 107(H)  BUN 8 - 23 mg/dL '14 18 17  ' Creatinine 0.44 - 1.00 mg/dL 0.71 0.75 0.69  Sodium 135 - 145 mmol/L 139 139 141  Potassium 3.5 - 5.1 mmol/L 3.7 3.7 3.4(L)  Chloride 98 - 111 mmol/L 103 102 103  CO2 22 - 32 mmol/L '28 28 26  ' Calcium 8.9 - 10.3 mg/dL 8.9 9.3 9.5  Total Protein 6.5 - 8.1 g/dL 6.5 7.3 7.1  Total Bilirubin 0.3 - 1.2 mg/dL 0.6 0.6 0.3  Alkaline Phos 38 - 126 U/L 55 75 64  AST 15 - 41 U/L '18 20 21  ' ALT 0 - 44 U/L '25 22 23    ' All questions were answered to patient's stated satisfaction. Encouraged patient to call with any new concerns or questions before his next visit to the cancer center and we  can certain see him sooner, if needed.      ASSESSMENT & PLAN:  1.  Left breast cancer: -Right modified radical mastectomy on 07/17/2013, 1/12 lymph nodes positive, grade 2, 1.4 cm invasive tumor, margins negative, ER 74% positive, PR 16% positive, HER-2 negative, Ki 6752%, PT1CPN1A.  Her 2/NEU was positive on testing by [facility] on lymph nodes. -6 cycles of TC CH from 08/27/2013 through 12/18/2013, 1 year Herceptin completed on 08/28/2014. -Status post XRT completed in July 2015. -Anastrozole started in August 2015. -Plan is to continue anastrozole beyond 5 years up to 10 years.  She is tolerating it very well with occasional hot flashes. -Last mammogram on 08/05/2020 which was BI-RADS Category 1 negative. -Labs done on 07/31/2020 are WNL -We will see her back in 6 months with repeat labs and mammogram.  2.  Osteoporosis: -DEXA scan from 01/09/2019 shows T score of -2.6.  This is slightly worse from prior scans of -2.5 and -2.4 prior to that. -She temporarily received Prolia on 10/09/2014, 04/09/2015, 09/28/2015.  This was discontinued secondary to high insurance cost. -She is also taken Fosamax, but unsure why she discontinued it. -She was started back on Fosamax weekly on 01/30/2019.  She is taking and tolerating well. -She is taking calcium 1200 mg and vitamin D 2000 units daily. -Bone density from 01/09/2019 shows a T score of -2.6. -Repeat bone density in May 2022.  Orders placed.  3.  Vitamin D deficiency: -Initial vitamin D level initially was 27.59. -Her vitamin D supplement was increased to 2000 units daily. -Repeat labs from 01/28/2020 and 07/31/2020 show an increase of her vitamin D level to 46. -Continue vitamin D supplementation.  Disposition: -Complete bone density in May 2022. -Return to clinic in 6 months with repeat labs (CBC, CMP, LDH) and MD assessment.  No problem-specific Assessment & Plan notes found for this encounter.  Orders placed this encounter:  No orders of the defined types were placed in this  encounter.  Faythe Casa, NP 08/27/2020 8:45 AM  [place] [phone]

## 2020-09-25 DIAGNOSIS — I10 Essential (primary) hypertension: Secondary | ICD-10-CM | POA: Diagnosis not present

## 2020-09-25 DIAGNOSIS — K219 Gastro-esophageal reflux disease without esophagitis: Secondary | ICD-10-CM | POA: Diagnosis not present

## 2020-10-27 DIAGNOSIS — M25579 Pain in unspecified ankle and joints of unspecified foot: Secondary | ICD-10-CM | POA: Diagnosis not present

## 2020-10-27 DIAGNOSIS — G5750 Tarsal tunnel syndrome, unspecified lower limb: Secondary | ICD-10-CM | POA: Diagnosis not present

## 2020-10-27 DIAGNOSIS — M79671 Pain in right foot: Secondary | ICD-10-CM | POA: Diagnosis not present

## 2020-11-02 ENCOUNTER — Other Ambulatory Visit (HOSPITAL_COMMUNITY): Payer: Self-pay | Admitting: Hematology

## 2020-11-02 DIAGNOSIS — C50912 Malignant neoplasm of unspecified site of left female breast: Secondary | ICD-10-CM

## 2020-11-24 DIAGNOSIS — M79671 Pain in right foot: Secondary | ICD-10-CM | POA: Diagnosis not present

## 2020-11-24 DIAGNOSIS — M25579 Pain in unspecified ankle and joints of unspecified foot: Secondary | ICD-10-CM | POA: Diagnosis not present

## 2020-11-24 DIAGNOSIS — G5750 Tarsal tunnel syndrome, unspecified lower limb: Secondary | ICD-10-CM | POA: Diagnosis not present

## 2021-02-04 DIAGNOSIS — M810 Age-related osteoporosis without current pathological fracture: Secondary | ICD-10-CM | POA: Diagnosis not present

## 2021-02-04 DIAGNOSIS — K219 Gastro-esophageal reflux disease without esophagitis: Secondary | ICD-10-CM | POA: Diagnosis not present

## 2021-02-04 DIAGNOSIS — Z0000 Encounter for general adult medical examination without abnormal findings: Secondary | ICD-10-CM | POA: Diagnosis not present

## 2021-02-04 DIAGNOSIS — G620 Drug-induced polyneuropathy: Secondary | ICD-10-CM | POA: Diagnosis not present

## 2021-02-04 DIAGNOSIS — I10 Essential (primary) hypertension: Secondary | ICD-10-CM | POA: Diagnosis not present

## 2021-02-04 DIAGNOSIS — K449 Diaphragmatic hernia without obstruction or gangrene: Secondary | ICD-10-CM | POA: Diagnosis not present

## 2021-02-05 DIAGNOSIS — E559 Vitamin D deficiency, unspecified: Secondary | ICD-10-CM | POA: Diagnosis not present

## 2021-02-05 DIAGNOSIS — Z1389 Encounter for screening for other disorder: Secondary | ICD-10-CM | POA: Diagnosis not present

## 2021-02-05 DIAGNOSIS — I10 Essential (primary) hypertension: Secondary | ICD-10-CM | POA: Diagnosis not present

## 2021-02-05 DIAGNOSIS — Z0000 Encounter for general adult medical examination without abnormal findings: Secondary | ICD-10-CM | POA: Diagnosis not present

## 2021-02-05 DIAGNOSIS — E7849 Other hyperlipidemia: Secondary | ICD-10-CM | POA: Diagnosis not present

## 2021-02-05 DIAGNOSIS — Z0001 Encounter for general adult medical examination with abnormal findings: Secondary | ICD-10-CM | POA: Diagnosis not present

## 2021-02-16 ENCOUNTER — Other Ambulatory Visit (HOSPITAL_COMMUNITY): Payer: Self-pay | Admitting: Surgery

## 2021-02-16 DIAGNOSIS — M810 Age-related osteoporosis without current pathological fracture: Secondary | ICD-10-CM

## 2021-02-16 DIAGNOSIS — C50919 Malignant neoplasm of unspecified site of unspecified female breast: Secondary | ICD-10-CM

## 2021-02-17 ENCOUNTER — Inpatient Hospital Stay (HOSPITAL_COMMUNITY): Payer: Medicare Other | Attending: Hematology

## 2021-02-17 ENCOUNTER — Other Ambulatory Visit: Payer: Self-pay

## 2021-02-17 ENCOUNTER — Ambulatory Visit (HOSPITAL_COMMUNITY)
Admission: RE | Admit: 2021-02-17 | Discharge: 2021-02-17 | Disposition: A | Discharge status: Home / Self Care | Payer: Medicare Other | Source: Ambulatory Visit | Attending: Oncology | Admitting: Oncology

## 2021-02-17 DIAGNOSIS — C50912 Malignant neoplasm of unspecified site of left female breast: Secondary | ICD-10-CM | POA: Diagnosis not present

## 2021-02-17 DIAGNOSIS — C773 Secondary and unspecified malignant neoplasm of axilla and upper limb lymph nodes: Secondary | ICD-10-CM | POA: Diagnosis not present

## 2021-02-17 DIAGNOSIS — I10 Essential (primary) hypertension: Secondary | ICD-10-CM | POA: Diagnosis not present

## 2021-02-17 DIAGNOSIS — Z79899 Other long term (current) drug therapy: Secondary | ICD-10-CM | POA: Diagnosis not present

## 2021-02-17 DIAGNOSIS — Z79811 Long term (current) use of aromatase inhibitors: Secondary | ICD-10-CM | POA: Insufficient documentation

## 2021-02-17 DIAGNOSIS — M810 Age-related osteoporosis without current pathological fracture: Secondary | ICD-10-CM | POA: Diagnosis not present

## 2021-02-17 DIAGNOSIS — K2270 Barrett's esophagus without dysplasia: Secondary | ICD-10-CM | POA: Insufficient documentation

## 2021-02-17 DIAGNOSIS — Z170 Estrogen receptor positive status [ER+]: Secondary | ICD-10-CM | POA: Insufficient documentation

## 2021-02-17 DIAGNOSIS — E538 Deficiency of other specified B group vitamins: Secondary | ICD-10-CM | POA: Insufficient documentation

## 2021-02-17 DIAGNOSIS — Z9012 Acquired absence of left breast and nipple: Secondary | ICD-10-CM | POA: Diagnosis not present

## 2021-02-17 DIAGNOSIS — I700 Atherosclerosis of aorta: Secondary | ICD-10-CM | POA: Diagnosis not present

## 2021-02-17 DIAGNOSIS — K219 Gastro-esophageal reflux disease without esophagitis: Secondary | ICD-10-CM | POA: Insufficient documentation

## 2021-02-17 DIAGNOSIS — C50919 Malignant neoplasm of unspecified site of unspecified female breast: Secondary | ICD-10-CM

## 2021-02-17 LAB — COMPREHENSIVE METABOLIC PANEL
ALT: 18 U/L (ref 0–44)
AST: 18 U/L (ref 15–41)
Albumin: 4.3 g/dL (ref 3.5–5.0)
Alkaline Phosphatase: 56 U/L (ref 38–126)
Anion gap: 7 (ref 5–15)
BUN: 22 mg/dL (ref 8–23)
CO2: 28 mmol/L (ref 22–32)
Calcium: 9 mg/dL (ref 8.9–10.3)
Chloride: 101 mmol/L (ref 98–111)
Creatinine, Ser: 0.62 mg/dL (ref 0.44–1.00)
GFR, Estimated: >60 mL/min (ref >60)
Glucose, Bld: 99 mg/dL (ref 70–99)
Potassium: 3.6 mmol/L (ref 3.5–5.1)
Sodium: 136 mmol/L (ref 135–145)
Total Bilirubin: 0.4 mg/dL (ref 0.3–1.2)
Total Protein: 6.9 g/dL (ref 6.5–8.1)

## 2021-02-17 LAB — CBC WITH DIFFERENTIAL/PLATELET
Abs Immature Granulocytes: 0.01 10*3/uL (ref 0.00–0.07)
Basophils Absolute: 0 10*3/uL (ref 0.0–0.1)
Basophils Relative: 1 %
Eosinophils Absolute: 0.3 10*3/uL (ref 0.0–0.5)
Eosinophils Relative: 5 %
HCT: 37.3 % (ref 36.0–46.0)
Hemoglobin: 12.4 g/dL (ref 12.0–15.0)
Immature Granulocytes: 0 %
Lymphocytes Relative: 31 %
Lymphs Abs: 1.7 10*3/uL (ref 0.7–4.0)
MCH: 34.4 pg — ABNORMAL HIGH (ref 26.0–34.0)
MCHC: 33.2 g/dL (ref 30.0–36.0)
MCV: 103.6 fL — ABNORMAL HIGH (ref 80.0–100.0)
Monocytes Absolute: 0.4 10*3/uL (ref 0.1–1.0)
Monocytes Relative: 8 %
Neutro Abs: 3.1 10*3/uL (ref 1.7–7.7)
Neutrophils Relative %: 55 %
Platelets: 181 10*3/uL (ref 150–400)
RBC: 3.6 MIL/uL — ABNORMAL LOW (ref 3.87–5.11)
RDW: 12 % (ref 11.5–15.5)
WBC: 5.5 10*3/uL (ref 4.0–10.5)
nRBC: 0 % (ref 0.0–0.2)

## 2021-02-17 LAB — VITAMIN D 25 HYDROXY (VIT D DEFICIENCY, FRACTURES): Vit D, 25-Hydroxy: 64.64 ng/mL (ref 30–100)

## 2021-02-17 LAB — LACTATE DEHYDROGENASE: LDH: 159 U/L (ref 98–192)

## 2021-02-17 LAB — VITAMIN B12: Vitamin B-12: 258 pg/mL (ref 180–914)

## 2021-02-24 ENCOUNTER — Inpatient Hospital Stay (HOSPITAL_COMMUNITY): Payer: Medicare Other | Admitting: Hematology and Oncology

## 2021-02-24 ENCOUNTER — Other Ambulatory Visit (HOSPITAL_COMMUNITY): Payer: Self-pay | Admitting: Hematology

## 2021-02-24 ENCOUNTER — Encounter (HOSPITAL_COMMUNITY): Payer: Self-pay | Admitting: Hematology and Oncology

## 2021-02-24 ENCOUNTER — Other Ambulatory Visit: Payer: Self-pay

## 2021-02-24 DIAGNOSIS — K2270 Barrett's esophagus without dysplasia: Secondary | ICD-10-CM | POA: Diagnosis not present

## 2021-02-24 DIAGNOSIS — I10 Essential (primary) hypertension: Secondary | ICD-10-CM | POA: Diagnosis not present

## 2021-02-24 DIAGNOSIS — K219 Gastro-esophageal reflux disease without esophagitis: Secondary | ICD-10-CM | POA: Diagnosis not present

## 2021-02-24 DIAGNOSIS — Z79811 Long term (current) use of aromatase inhibitors: Secondary | ICD-10-CM | POA: Diagnosis not present

## 2021-02-24 DIAGNOSIS — Z9012 Acquired absence of left breast and nipple: Secondary | ICD-10-CM | POA: Diagnosis not present

## 2021-02-24 DIAGNOSIS — M810 Age-related osteoporosis without current pathological fracture: Secondary | ICD-10-CM

## 2021-02-24 DIAGNOSIS — I700 Atherosclerosis of aorta: Secondary | ICD-10-CM | POA: Diagnosis not present

## 2021-02-24 DIAGNOSIS — Z1231 Encounter for screening mammogram for malignant neoplasm of breast: Secondary | ICD-10-CM

## 2021-02-24 DIAGNOSIS — E538 Deficiency of other specified B group vitamins: Secondary | ICD-10-CM | POA: Diagnosis not present

## 2021-02-24 DIAGNOSIS — Z79899 Other long term (current) drug therapy: Secondary | ICD-10-CM | POA: Diagnosis not present

## 2021-02-24 DIAGNOSIS — C50919 Malignant neoplasm of unspecified site of unspecified female breast: Secondary | ICD-10-CM | POA: Diagnosis not present

## 2021-02-24 DIAGNOSIS — C50912 Malignant neoplasm of unspecified site of left female breast: Secondary | ICD-10-CM | POA: Diagnosis not present

## 2021-02-24 DIAGNOSIS — C773 Secondary and unspecified malignant neoplasm of axilla and upper limb lymph nodes: Secondary | ICD-10-CM | POA: Diagnosis not present

## 2021-02-24 DIAGNOSIS — Z170 Estrogen receptor positive status [ER+]: Secondary | ICD-10-CM | POA: Diagnosis not present

## 2021-02-24 MED ORDER — ALENDRONATE SODIUM 70 MG PO TABS: ORAL_TABLET | ORAL | 6 refills | Status: DC

## 2021-02-24 NOTE — Assessment & Plan Note (Signed)
So far, she tolerated Arimidex well She does have some persistent osteoporosis and is taking calcium with vitamin D and Fosamax She will return again in 6 months for further follow-up I reminded her to get mammogram done before she returns Her breast exam today is normal

## 2021-02-24 NOTE — Assessment & Plan Note (Signed)
I have reviewed her recent bone density scan I verify that she is taking calcium with vitamin D She had no side effects from Fosamax I reminded her about risk of esophagitis with Fosamax I refilled her prescription today She will return again in 6 months for further follow-up

## 2021-02-24 NOTE — Progress Notes (Signed)
[facility] FOLLOW-UP progress notes  Patient Care Team: Jake Samples, PA-C as PCP - General (Family Medicine) Felicie Morn, MD (Inactive) as Consulting Physician (General Surgery)  CHIEF COMPLAINTS/PURPOSE OF VISIT:  ER positive breast cancer, status post surgery, ongoing treatment with antiestrogen therapy  HISTORY OF PRESENTING ILLNESS:  Heather Vaughan 65 y.o. female is seen today because her primary oncologist is not available She returns for further follow-up She is compliant taking her medications as directed She denies hot flashes She denies symptoms of heartburn or reflux from Fosamax She denies any recent abnormal breast examination, palpable mass, abnormal breast appearance or nipple changes   I reviewed the patient's records extensive and collaborated the history with the patient. Summary of her history is as follows: Oncology History  Invasive ductal carcinoma metastasized to axillary lymph node, left  05/28/2013 Imaging   Screening mammogram    06/19/2013 Imaging   Left diagnostic mammogram    06/25/2013 Imaging   CT CAP- borderline enlarged axillary lymph node on left.  Small pulmonary nodules noted but too small for PET or biopsy; surveillance recommended.     07/03/2013 Surgery   Left partial mastectomy.  Invasive ductal carcinoma. 1.4 cm.  POSITIVE MARGINS.  Additional extensive DCIS noted.  ER 74%, PR 16%, Ki-67 52%, HER2 NEGATIVE.    07/17/2013 Surgery   Left modified radiacl mastectomy with left axillary node dissection.  Negative residual cancer.  Negative inked margins.  1/12 positive lymph nodes.  FINAL MARGINS NEGATIVE.  LYMPH NODE IS HER2 POSITIVE.    08/13/2013 Imaging   MUGA scan shows left ventricular EF of 62%    08/27/2013 - 12/18/2013 Chemotherapy   TCH x 6 cycles    01/07/2014 -  Chemotherapy   Herceptin x 52 weeks    01/27/2014 - 03/13/2014 Radiation Therapy   By Dr. Pablo Ledger    03/29/2014 -  Anti-estrogen  oral therapy   Aromasin    10/24/2014 Imaging   Bone density- BMD as determined from Femur Neck Right is 0.698 g/cm2 with a T-Score of -2.4. This patient is considered osteopenic according to [facility]) criteria. (Lumbar spine was not utilized due to advanced degenerative changes).    11/05/2014 Imaging   MUGA- Left ventricular ejection fraction equals 70%. No change from prior.    04/27/2015 Procedure   Port-A-Cath removal by Dr. Arnoldo Morale    06/19/2015 Imaging   CT chest- Status post left mastectomy, without recurrent or metastatic disease. Decrease in size of right-sided pulmonary nodules, favoring an infectious or inflammatory etiology.    06/21/2017 Imaging   CT Chest w contrast: IMPRESSION: 1. No evidence of metastatic disease in the thorax. Multiple previously noted tiny pulmonary nodules are stable compared to prior studies and considered benign. 2. Aortic atherosclerosis. 3. Mild diffuse bronchial wall thickening with mild centrilobular and paraseptal emphysema most apparent the lung apices; imaging findings that may suggest underlying COPD.      MEDICAL HISTORY:  Past Medical History:  Diagnosis Date   Anxiety    B12 deficiency 01/27/2016   Barrett's esophagus    Breast cancer ([place])    Breast cancer, left breast ([place])    Bronchitis 07/27/2011   GERD (gastroesophageal reflux disease)    Hypertension    Osteopenia 10/09/2014    SURGICAL HISTORY: Past Surgical History:  Procedure Laterality Date   BREAST BIOPSY Bilateral    X 4- benign   ESOPHAGOGASTRODUODENOSCOPY  08/03/2011   Procedure: ESOPHAGOGASTRODUODENOSCOPY (EGD);  Surgeon: Mechele Dawley  Laural Golden, MD;  [facility];  Service: Endoscopy;  Laterality: N/A;  12:45   EXCISION MORTON'S NEUROMA  07/14/2011   Procedure: EXCISION MORTON'S NEUROMA;  Surgeon: Marcheta Grammes;  Location: [facility];  Service: Orthopedics;  Laterality: Right;  Removal Neuroma 3rd Interspace Right Foot    MASTECTOMY Left    MASTECTOMY MODIFIED RADICAL Left 07/17/2013   Procedure: MASTECTOMY MODIFIED RADICAL WITH LEFT AXILLARY TISSUE REMOVAL;  Surgeon: Scherry Ran, MD;  Location: [facility];  Service: General;  Laterality: Left;   PARTIAL MASTECTOMY WITH NEEDLE LOCALIZATION Left 07/03/2013   Procedure: PARTIAL MASTECTOMY STATUS POST NEEDLE LOCALIZATION;  Surgeon: Scherry Ran, MD;  Location: [facility];  Service: General;  Laterality: Left;   PORT-A-CATH REMOVAL Right 04/27/2015   Procedure: REMOVAL PORT-A-CATH;  Surgeon: Aviva Signs, MD;  Location: [facility];  Service: General;  Laterality: Right;   PORTACATH PLACEMENT Right 08/16/2013   Procedure: INSERTION PORT-A-CATH RIGHT SUBCLAVIAN;  Surgeon: Scherry Ran, MD;  Location: [facility];  Service: General;  Laterality: Right;    SOCIAL HISTORY: Social History   Socioeconomic History   Marital status: Married    Spouse name: Not on file   Number of children: Not on file   Years of education: Not on file   Highest education level: Not on file  Occupational History   Not on file  Tobacco Use   Smoking status: Former    Packs/day: 1.00    Years: 20.00    Pack years: 20.00    Types: Cigarettes    Quit date: 09/05/1993    Years since quitting: 27.4   Smokeless tobacco: Never  Substance and Sexual Activity   Alcohol use: No    Comment: occasionally   Drug use: No   Sexual activity: Not on file  Other Topics Concern   Not on file  Social History Narrative   Not on file   Social Determinants of Health   Financial Resource Strain: Not on file  Food Insecurity: Not on file  Transportation Needs: Not on file  Physical Activity: Not on file  Stress: Not on file  Social Connections: Not on file  Intimate Partner Violence: Not on file    FAMILY HISTORY: Family History  Problem Relation Age of Onset   Anesthesia problems Neg Hx    Hypotension Neg Hx    Malignant hyperthermia Neg Hx    Pseudochol deficiency Neg Hx      ALLERGIES:  has No Known Allergies.  MEDICATIONS:  Current Outpatient Medications  Medication Sig Dispense Refill   anastrozole (ARIMIDEX) 1 MG tablet TAKE 1 TABLET(1 MG) BY MOUTH DAILY 30 tablet 6   Calcium Carbonate-Vitamin D (CALCIUM-VITAMIN D3 PO) Take by mouth.     DULoxetine (CYMBALTA) 30 MG capsule Take 30 mg by mouth daily.     hydrochlorothiazide (HYDRODIURIL) 25 MG tablet      ibuprofen (ADVIL) 600 MG tablet Take 600 mg by mouth 3 (three) times daily.     losartan (COZAAR) 100 MG tablet      omeprazole (PRILOSEC) 20 MG capsule Take 20 mg by mouth 2 (two) times daily.      traZODone (DESYREL) 50 MG tablet Take 25 mg by mouth at bedtime as needed for sleep. For sleep     alendronate (FOSAMAX) 70 MG tablet TAKE 1 TABLET(70 MG) BY MOUTH 1 TIME A WEEK WITH A FULL GLASS OF WATER AND ON AN EMPTY STOMACH 4 tablet 6   No current facility-administered medications for  this visit.    REVIEW OF SYSTEMS:   Constitutional: Denies fevers, chills or abnormal night sweats Eyes: Denies blurriness of vision, double vision or watery eyes Ears, nose, mouth, throat, and face: Denies mucositis or sore throat Respiratory: Denies cough, dyspnea or wheezes Cardiovascular: Denies palpitation, chest discomfort or lower extremity swelling Gastrointestinal:  Denies nausea, heartburn or change in bowel habits Skin: Denies abnormal skin rashes Lymphatics: Denies new lymphadenopathy or easy bruising Neurological:Denies numbness, tingling or new weaknesses Behavioral/Psych: Mood is stable, no new changes  All other systems were reviewed with the patient and are negative.  PHYSICAL EXAMINATION: ECOG PERFORMANCE STATUS: 0 - Asymptomatic  Vitals:   02/24/21 1203  BP: (!) 138/103  Resp: 16  Temp: 98.3 F (36.8 C)  SpO2: 98%   Filed Weights   02/24/21 1203  Weight: 142 lb 6.4 oz (64.6 kg)    GENERAL:alert, no distress and comfortable SKIN: skin color, texture, turgor are normal, no rashes or  significant lesions EYES: normal, conjunctiva are pink and non-injected, sclera clear OROPHARYNX:no exudate, normal lips, buccal mucosa, and tongue  NECK: supple, thyroid normal size, non-tender, without nodularity LYMPH:  no palpable lymphadenopathy in the cervical, axillary or inguinal LUNGS: clear to auscultation and percussion with normal breathing effort HEART: regular rate & rhythm and no murmurs without lower extremity edema ABDOMEN:abdomen soft, non-tender and normal bowel sounds Musculoskeletal:no cyanosis of digits and no clubbing  PSYCH: alert & oriented x 3 with fluent speech NEURO: no focal motor/sensory deficits Well-healed surgical scar on the left.  No palpable abnormalities  LABORATORY DATA:  I have reviewed the data as listed Lab Results  Component Value Date   WBC 5.5 02/17/2021   HGB 12.4 02/17/2021   HCT 37.3 02/17/2021   MCV 103.6 (H) 02/17/2021   PLT 181 02/17/2021   Recent Labs    07/31/20 1011 02/17/21 1301  NA 139 136  K 3.7 3.6  CL 103 101  CO2 28 28  GLUCOSE 101* 99  BUN 14 22  CREATININE 0.71 0.62  CALCIUM 8.9 9.0  GFRNONAA >60 >60  PROT 6.5 6.9  ALBUMIN 3.8 4.3  AST 18 18  ALT 25 18  ALKPHOS 55 56  BILITOT 0.6 0.4    RADIOGRAPHIC STUDIES: I have personally reviewed the radiological images as listed and agreed with the findings in the report. DG Bone Density  Result Date: 02/17/2021 EXAM: DUAL X-RAY ABSORPTIOMETRY (DXA) FOR BONE MINERAL DENSITY IMPRESSION: Your patient Piera Downs completed a BMD test on 02/17/2021 using the [employer] (software version: 14.10) manufactured by [employer]. The following summarizes the results of our evaluation. Technologist: BRG PATIENT BIOGRAPHICAL: Name: Heather Vaughan, Heather Vaughan Patient ID: [id number] Birth Date: [birth year] Height: 62.0 in. Gender: Female Exam Date: 02/17/2021 Weight: 150.5 lbs. Indications: Height Loss, History of Fracture (Adult), Hx Breast Ca, Post Menopausal, Low  Calcium Intake, Caucasian, Follow up Osteoporosis Fractures: Toe Treatments: Arimidex, Aromasin, Calcium, Fosamax, Vitamin D DENSITOMETRY RESULTS: Site         Region     Measured Date Measured Age WHO Classification Young Adult T-score BMD         %Change vs. Previous Significant Change (*) DualFemur Neck Right 02/17/2021 66.4 Osteoporosis -2.6 0.675 g/cm2 -0.7% - DualFemur Neck Right 01/09/2019 64.3 Osteoporosis -2.6 0.680 g/cm2 -2.3% - DualFemur Neck Right 12/21/2016 62.3 Osteoporosis -2.5 0.696 g/cm2 -0.3% - DualFemur Neck Right 09/29/2014 60.0 Osteopenia -2.4 0.698 g/cm2 - - DualFemur Total Mean 02/17/2021 66.4 Osteopenia -1.7 0.794 g/cm2  2.3% - DualFemur Total Mean 01/09/2019 64.3 - - 0.776 g/cm2 -4.1% Yes DualFemur Total Mean 12/21/2016 62.3 - - 0.809 g/cm2 -0.6% - DualFemur Total Mean 09/29/2014 60.0 Osteopenia -1.5 0.814 g/cm2 - - Left Forearm Radius 33% 02/17/2021 66.4 Normal -1.0 0.636 g/cm2 6.0% - Left Forearm Radius 33% 01/09/2019 64.3 Osteopenia -1.5 0.599 g/cm2 -7.8% Yes Left Forearm Radius 33% 12/21/2016 62.3 Normal -0.8 0.650 g/cm2 -2.8% - Left Forearm Radius 33% 09/29/2014 60.0 Normal -0.5 0.669 g/cm2 - - ASSESSMENT: The BMD measured at Femur Neck Right is 0.675 g/cm2 with a T-score of -2.6. This patient is considered osteoporotic according to [facility]) criteria. The scan quality is good. Compared with the prior study on 01/09/19 , the BMD of the total mean shows no statistically significant change. Lumbar spine was excluded due to advanced degenerative changes. [facility]) criteria for post-menopausal, Caucasian Women: Normal:       T-score at or above -1 SD Osteopenia:   T-score between -1 and -2.5 SD Osteoporosis: T-score at or below -2.5 SD RECOMMENDATIONS: 1. All patients should optimize calcium and vitamin D intake. 2. Consider FDA-approved medical therapies in postmenopausal women and med aged 63 years and older, based on the following: a. A hip or  vertebral (clinical or morphometric) fracture b. T-score< -2.5 at the femoral neck or spine after appropriate evaluation to exclude secondary causes c. Low bone mass (T-score between -1.0 and -2.5 at the femoral neck or spine) and a 10-year probability of a hip fracture > 3% or a 10-year probability of a major osteoporosis-related fracture > 20% based on the [place]-adapted WHO algorithm d. Clinician judgment and/or patient preferences may indicate treatment for people with 10-year fracture probabilities above or below these levels FOLLOW-UP: People with diagnosed cases of osteoporosis or at high risk for fracture should have regular bone mineral density tests. For patients eligible for Medicare, routine testing is allowed once every 2 years. The testing frequency can be increased to one year for patients who have rapidly progressing disease, those who are receiving or discontinuing medical therapy to restore bone mass, or have additional risk factors. I have reviewed this report, and agree with the above findings. Mark A. Thornton Papas, M.D. [facility] Electronically Signed   By: Lavonia Dana M.D.   On: 02/17/2021 16:12    ASSESSMENT & PLAN:  Invasive ductal carcinoma metastasized to axillary lymph node, left So far, she tolerated Arimidex well She does have some persistent osteoporosis and is taking calcium with vitamin D and Fosamax She will return again in 6 months for further follow-up I reminded her to get mammogram done before she returns Her breast exam today is normal  Osteoporosis I have reviewed her recent bone density scan I verify that she is taking calcium with vitamin D She had no side effects from Fosamax I reminded her about risk of esophagitis with Fosamax I refilled her prescription today She will return again in 6 months for further follow-up  No orders of the defined types were placed in this encounter.   All questions were answered. The patient knows to call the clinic  with any problems, questions or concerns. The total time spent in the appointment was 20 minutes encounter with patients including review of chart and various tests results, discussions about plan of care and coordination of care plan   Heath Lark, MD 02/24/2021 1:23 PM

## 2021-02-25 ENCOUNTER — Other Ambulatory Visit (HOSPITAL_COMMUNITY): Payer: Self-pay | Admitting: Hematology

## 2021-02-25 DIAGNOSIS — M810 Age-related osteoporosis without current pathological fracture: Secondary | ICD-10-CM

## 2021-02-26 ENCOUNTER — Other Ambulatory Visit (HOSPITAL_COMMUNITY): Payer: Self-pay

## 2021-02-26 DIAGNOSIS — M810 Age-related osteoporosis without current pathological fracture: Secondary | ICD-10-CM

## 2021-02-26 DIAGNOSIS — C50912 Malignant neoplasm of unspecified site of left female breast: Secondary | ICD-10-CM

## 2021-02-26 DIAGNOSIS — C50919 Malignant neoplasm of unspecified site of unspecified female breast: Secondary | ICD-10-CM

## 2021-03-08 ENCOUNTER — Encounter: Payer: Self-pay | Admitting: Emergency Medicine

## 2021-03-08 ENCOUNTER — Ambulatory Visit
Admission: EM | Admit: 2021-03-08 | Discharge: 2021-03-08 | Disposition: A | Discharge status: Home / Self Care | Payer: Medicare Other | Attending: Family Medicine | Admitting: Family Medicine

## 2021-03-08 DIAGNOSIS — M5386 Other specified dorsopathies, lumbar region: Secondary | ICD-10-CM

## 2021-03-08 MED ORDER — KETOROLAC TROMETHAMINE 30 MG/ML IJ SOLN
30.0000 mg | Freq: Once | INTRAMUSCULAR | Status: AC
  Administered 2021-03-08: 30 mg via INTRAMUSCULAR

## 2021-03-08 MED ORDER — TIZANIDINE HCL 2 MG PO TABS: 2.0000 mg | ORAL_TABLET | Freq: Every evening | ORAL | 0 refills | Status: DC | PRN

## 2021-03-08 MED ORDER — DICLOFENAC SODIUM 25 MG PO TBEC: 25.0000 mg | DELAYED_RELEASE_TABLET | Freq: Two times a day (BID) | ORAL | 0 refills | Status: AC | PRN

## 2021-03-08 NOTE — ED Provider Notes (Signed)
[facility]    CSN: [id number] Arrival date & time: 03/08/21  1652      History   Chief Complaint No chief complaint on file.   HPI Heather Vaughan is a 65 y.o. female.   HPI Patient presents with right low back and right sided hip pain after strepping from her husband pickup truck 3 days ago the pain comes and goes. 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 No falls or other associated injury.  Past Medical History:  Diagnosis Date   Anxiety    B12 deficiency 01/27/2016   Barrett's esophagus    Breast cancer ([place])    Breast cancer, left breast ([place])    Bronchitis 07/27/2011   GERD (gastroesophageal reflux disease)    Hypertension    Osteopenia 10/09/2014    Patient Active Problem List   Diagnosis Date Noted   B12 deficiency 01/27/2016   Osteoporosis 10/09/2014   Peripheral neuropathy due to chemotherapy ([place]) 05/08/2014   Unstable balance 05/08/2014   Stiffness of joint, not elsewhere classified, ankle and foot 05/08/2014   Ankle weakness 05/08/2014   Invasive ductal carcinoma metastasized to axillary lymph node, left 08/09/2013   Barrett esophagus 12/22/2011   GERD (gastroesophageal reflux disease) 06/23/2011   Hypertension 06/23/2011    Past Surgical History:  Procedure Laterality Date   BREAST BIOPSY Bilateral    X 4- benign   ESOPHAGOGASTRODUODENOSCOPY  08/03/2011   Procedure: ESOPHAGOGASTRODUODENOSCOPY (EGD);  Surgeon: Rogene Houston, MD;  [facility];  Service: Endoscopy;  Laterality: N/A;  12:45   EXCISION MORTON'S NEUROMA  07/14/2011   Procedure: EXCISION MORTON'S NEUROMA;  Surgeon: Marcheta Grammes;  Location: [facility];  Service: Orthopedics;  Laterality: Right;  Removal Neuroma 3rd Interspace Right Foot   MASTECTOMY Left    MASTECTOMY MODIFIED RADICAL Left 07/17/2013   Procedure: MASTECTOMY MODIFIED RADICAL WITH LEFT AXILLARY TISSUE REMOVAL;   Surgeon: Scherry Ran, MD;  Location: [facility];  Service: General;  Laterality: Left;   PARTIAL MASTECTOMY WITH NEEDLE LOCALIZATION Left 07/03/2013   Procedure: PARTIAL MASTECTOMY STATUS POST NEEDLE LOCALIZATION;  Surgeon: Scherry Ran, MD;  Location: [facility];  Service: General;  Laterality: Left;   PORT-A-CATH REMOVAL Right 04/27/2015   Procedure: REMOVAL PORT-A-CATH;  Surgeon: Aviva Signs, MD;  Location: [facility];  Service: General;  Laterality: Right;   PORTACATH PLACEMENT Right 08/16/2013   Procedure: INSERTION PORT-A-CATH RIGHT SUBCLAVIAN;  Surgeon: Scherry Ran, MD;  Location: [facility];  Service: General;  Laterality: Right;    OB History   No obstetric history on file.      Home Medications    Prior to Admission medications   Medication Sig Start Date End Date Taking? Authorizing Provider  diclofenac (VOLTAREN) 25 MG EC tablet Take 1 tablet (25 mg total) by mouth 2 (two) times daily as needed for moderate pain. 03/08/21  Yes Scot Jun, FNP  tiZANidine (ZANAFLEX) 2 MG tablet Take 1-2 tablets (2-4 mg total) by mouth at bedtime and may repeat dose one time if needed. 03/08/21  Yes Scot Jun, FNP  alendronate (FOSAMAX) 70 MG tablet TAKE 1 TABLET(70 MG) BY MOUTH 1 TIME A WEEK WITH A FULL GLASS OF WATER AND ON AN EMPTY STOMACH 02/24/21   Heath Lark, MD  anastrozole (ARIMIDEX) 1 MG tablet TAKE 1 TABLET(1 MG) BY MOUTH DAILY 11/03/20   Derek Jack, MD  Calcium Carbonate-Vitamin D (  CALCIUM-VITAMIN D3 PO) Take by mouth.    [provider]  DULoxetine (CYMBALTA) 30 MG capsule Take 30 mg by mouth daily.    [provider]  hydrochlorothiazide (HYDRODIURIL) 25 MG tablet  12/20/18   [provider]  ibuprofen (ADVIL) 600 MG tablet Take 600 mg by mouth 3 (three) times daily. 05/07/20   [provider]  losartan (COZAAR) 100 MG tablet  12/16/18   [provider]  omeprazole (PRILOSEC) 20 MG capsule Take 20 mg by mouth 2 (two)  times daily.  04/14/15   [provider]  traZODone (DESYREL) 50 MG tablet Take 25 mg by mouth at bedtime as needed for sleep. For sleep 06/02/11   [provider]    Family History Family History  Problem Relation Age of Onset   Anesthesia problems Neg Hx    Hypotension Neg Hx    Malignant hyperthermia Neg Hx    Pseudochol deficiency Neg Hx     Social History Social History   Tobacco Use   Smoking status: Former    Packs/day: 1.00    Years: 20.00    Pack years: 20.00    Types: Cigarettes    Quit date: 09/05/1993    Years since quitting: 27.5   Smokeless tobacco: Never  Substance Use Topics   Alcohol use: No    Comment: occasionally   Drug use: No     Allergies   Patient has no known allergies.   Review of Systems Review of Systems Pertinent negatives listed in HPI   Physical Exam Triage Vital Signs ED Triage Vitals  Enc Vitals Group     BP 03/08/21 1808 (!) 177/110     Pulse Rate 03/08/21 1808 84     Resp 03/08/21 1808 16     Temp 03/08/21 1808 97.6 F (36.4 C)     Temp src --      SpO2 03/08/21 1808 97 %     Weight --      Height --      Head Circumference --      Peak Flow --      Pain Score 03/08/21 1811 7     Pain Loc --      Pain Edu? --      Excl. in [place]? --    No data found.  Updated Vital Signs BP (!) 177/110 (BP Location: Right Arm)   Pulse 84   Temp 97.6 F (36.4 C)   Resp 16   SpO2 97%   Visual Acuity Right Eye Distance:   Left Eye Distance:   Bilateral Distance:    Right Eye Near:   Left Eye Near:    Bilateral Near:     Physical Exam General appearance: alert, well developed, well nourished, cooperative and in no distress Head: Normocephalic, without obvious abnormality, atraumatic Respiratory: Respirations even and unlabored, normal respiratory rate Heart: rate and rhythm normal. No gallop or murmurs noted Back: lumbar tenderness (nonpalpable) present with movement, no bulge or visible deformity  Skin:  Skin color, texture, turgor normal. No rashes seen  Psych: Appropriate mood and affect. Neurological: No focal deficit  UC Treatments / Results  Labs (all labs ordered are listed, but only abnormal results are displayed) Labs Reviewed - No data to display  EKG   Radiology No results found.  Procedures Procedures (including critical care time)  Medications Ordered in UC Medications  ketorolac (TORADOL) 30 MG/ML injection 30 mg (30 mg Intramuscular Given 03/08/21 1843)  Initial Impression / Assessment and Plan / UC Course  I have reviewed the triage vital signs and the nursing notes.  Pertinent labs & imaging results that were available during my care of the patient were reviewed by me and considered in my medical decision making (see chart for details).    Toradol 30 mg IM given in clinic for pain and inflammation. Tizanidine and Voltaren for home management. Heat application for acute pain management. Follow-up with PCP as needed or return precaution as needed. Final Clinical Impressions(s) / UC Diagnoses   Final diagnoses:  Sciatica of right side associated with disorder of lumbar spine     Discharge Instructions      Hold ibuprofen and start diclofenac 25 mg twice daily as needed for hip and low back pain . For moderate pain you may take tizanidine 1 to 2 tablets at bedtime and may repeat within 4 hours if needed at bedtime.  Medication causes severe drowsiness therefore avoid taking while working or driving. Follow-up with your primary care provider as needed if pain becomes severe or does not respond to current therapy.     ED Prescriptions     Medication Sig Dispense Auth. Provider   tiZANidine (ZANAFLEX) 2 MG tablet Take 1-2 tablets (2-4 mg total) by mouth at bedtime and may repeat dose one time if needed. 20 tablet Scot Jun, FNP   diclofenac (VOLTAREN) 25 MG EC tablet Take 1 tablet (25 mg total) by mouth 2 (two) times daily as needed for moderate  pain. 20 tablet Scot Jun, FNP      PDMP not reviewed this encounter.   Scot Jun, FNP 03/12/21 2159

## 2021-03-08 NOTE — Discharge Instructions (Addendum)
Hold ibuprofen and start diclofenac 25 mg twice daily as needed for hip and low back pain . For moderate pain you may take tizanidine 1 to 2 tablets at bedtime and may repeat within 4 hours if needed at bedtime.  Medication causes severe drowsiness therefore avoid taking while working or driving. Follow-up with your primary care provider as needed if pain becomes severe or does not respond to current therapy.

## 2021-03-08 NOTE — ED Triage Notes (Signed)
Right side pain from lower back that radiates down right leg since Thursday.

## 2021-04-27 ENCOUNTER — Other Ambulatory Visit (HOSPITAL_COMMUNITY): Payer: Self-pay | Admitting: Hematology

## 2021-04-27 DIAGNOSIS — C50912 Malignant neoplasm of unspecified site of left female breast: Secondary | ICD-10-CM

## 2021-04-27 DIAGNOSIS — C773 Secondary and unspecified malignant neoplasm of axilla and upper limb lymph nodes: Secondary | ICD-10-CM

## 2021-05-07 DIAGNOSIS — M79671 Pain in right foot: Secondary | ICD-10-CM | POA: Diagnosis not present

## 2021-05-07 DIAGNOSIS — M25579 Pain in unspecified ankle and joints of unspecified foot: Secondary | ICD-10-CM | POA: Diagnosis not present

## 2021-05-07 DIAGNOSIS — G5750 Tarsal tunnel syndrome, unspecified lower limb: Secondary | ICD-10-CM | POA: Diagnosis not present

## 2021-07-01 DIAGNOSIS — I10 Essential (primary) hypertension: Secondary | ICD-10-CM | POA: Diagnosis not present

## 2021-08-09 ENCOUNTER — Other Ambulatory Visit: Payer: Self-pay

## 2021-08-09 ENCOUNTER — Ambulatory Visit (HOSPITAL_COMMUNITY)
Admission: RE | Admit: 2021-08-09 | Discharge: 2021-08-09 | Disposition: A | Discharge status: Home / Self Care | Payer: Medicare Other | Source: Ambulatory Visit | Attending: Hematology | Admitting: Hematology

## 2021-08-09 DIAGNOSIS — Z1231 Encounter for screening mammogram for malignant neoplasm of breast: Secondary | ICD-10-CM | POA: Diagnosis not present

## 2021-08-31 ENCOUNTER — Other Ambulatory Visit: Payer: Self-pay

## 2021-08-31 ENCOUNTER — Inpatient Hospital Stay (HOSPITAL_COMMUNITY): Payer: Medicare Other | Attending: Hematology

## 2021-08-31 DIAGNOSIS — M810 Age-related osteoporosis without current pathological fracture: Secondary | ICD-10-CM

## 2021-08-31 DIAGNOSIS — I700 Atherosclerosis of aorta: Secondary | ICD-10-CM | POA: Insufficient documentation

## 2021-08-31 DIAGNOSIS — Z9221 Personal history of antineoplastic chemotherapy: Secondary | ICD-10-CM | POA: Diagnosis not present

## 2021-08-31 DIAGNOSIS — Z791 Long term (current) use of non-steroidal anti-inflammatories (NSAID): Secondary | ICD-10-CM | POA: Insufficient documentation

## 2021-08-31 DIAGNOSIS — Z923 Personal history of irradiation: Secondary | ICD-10-CM | POA: Diagnosis not present

## 2021-08-31 DIAGNOSIS — Z79811 Long term (current) use of aromatase inhibitors: Secondary | ICD-10-CM | POA: Insufficient documentation

## 2021-08-31 DIAGNOSIS — J432 Centrilobular emphysema: Secondary | ICD-10-CM | POA: Insufficient documentation

## 2021-08-31 DIAGNOSIS — C50912 Malignant neoplasm of unspecified site of left female breast: Secondary | ICD-10-CM

## 2021-08-31 DIAGNOSIS — Z79899 Other long term (current) drug therapy: Secondary | ICD-10-CM | POA: Insufficient documentation

## 2021-08-31 DIAGNOSIS — Z170 Estrogen receptor positive status [ER+]: Secondary | ICD-10-CM | POA: Diagnosis not present

## 2021-08-31 DIAGNOSIS — C50919 Malignant neoplasm of unspecified site of unspecified female breast: Secondary | ICD-10-CM

## 2021-08-31 LAB — CBC WITH DIFFERENTIAL/PLATELET
Abs Immature Granulocytes: 0.01 10*3/uL (ref 0.00–0.07)
Basophils Absolute: 0.1 10*3/uL (ref 0.0–0.1)
Basophils Relative: 1 %
Eosinophils Absolute: 0.2 10*3/uL (ref 0.0–0.5)
Eosinophils Relative: 4 %
HCT: 37.5 % (ref 36.0–46.0)
Hemoglobin: 12.5 g/dL (ref 12.0–15.0)
Immature Granulocytes: 0 %
Lymphocytes Relative: 30 %
Lymphs Abs: 1.9 10*3/uL (ref 0.7–4.0)
MCH: 34.8 pg — ABNORMAL HIGH (ref 26.0–34.0)
MCHC: 33.3 g/dL (ref 30.0–36.0)
MCV: 104.5 fL — ABNORMAL HIGH (ref 80.0–100.0)
Monocytes Absolute: 0.4 10*3/uL (ref 0.1–1.0)
Monocytes Relative: 6 %
Neutro Abs: 3.8 10*3/uL (ref 1.7–7.7)
Neutrophils Relative %: 59 %
Platelets: 201 10*3/uL (ref 150–400)
RBC: 3.59 MIL/uL — ABNORMAL LOW (ref 3.87–5.11)
RDW: 12.2 % (ref 11.5–15.5)
WBC: 6.4 10*3/uL (ref 4.0–10.5)
nRBC: 0 % (ref 0.0–0.2)

## 2021-08-31 LAB — COMPREHENSIVE METABOLIC PANEL
ALT: 25 U/L (ref 0–44)
AST: 22 U/L (ref 15–41)
Albumin: 4.2 g/dL (ref 3.5–5.0)
Alkaline Phosphatase: 76 U/L (ref 38–126)
Anion gap: 9 (ref 5–15)
BUN: 15 mg/dL (ref 8–23)
CO2: 26 mmol/L (ref 22–32)
Calcium: 9.3 mg/dL (ref 8.9–10.3)
Chloride: 106 mmol/L (ref 98–111)
Creatinine, Ser: 0.85 mg/dL (ref 0.44–1.00)
GFR, Estimated: >60 mL/min (ref >60)
Glucose, Bld: 120 mg/dL — ABNORMAL HIGH (ref 70–99)
Potassium: 3.1 mmol/L — ABNORMAL LOW (ref 3.5–5.1)
Sodium: 141 mmol/L (ref 135–145)
Total Bilirubin: 0.2 mg/dL — ABNORMAL LOW (ref 0.3–1.2)
Total Protein: 6.8 g/dL (ref 6.5–8.1)

## 2021-08-31 LAB — VITAMIN D 25 HYDROXY (VIT D DEFICIENCY, FRACTURES): Vit D, 25-Hydroxy: 53.5 ng/mL (ref 30–100)

## 2021-08-31 LAB — VITAMIN B12: Vitamin B-12: 318 pg/mL (ref 180–914)

## 2021-08-31 LAB — LACTATE DEHYDROGENASE: LDH: 143 U/L (ref 98–192)

## 2021-09-04 ENCOUNTER — Other Ambulatory Visit (HOSPITAL_COMMUNITY): Payer: Self-pay | Admitting: Hematology and Oncology

## 2021-09-04 DIAGNOSIS — M810 Age-related osteoporosis without current pathological fracture: Secondary | ICD-10-CM

## 2021-09-06 ENCOUNTER — Encounter (HOSPITAL_COMMUNITY): Payer: Self-pay | Admitting: Hematology & Oncology

## 2021-09-07 ENCOUNTER — Other Ambulatory Visit: Payer: Self-pay

## 2021-09-07 ENCOUNTER — Inpatient Hospital Stay (HOSPITAL_COMMUNITY): Payer: Medicare Other | Admitting: Hematology

## 2021-09-07 VITALS — BP 140/76 | HR 81 | Temp 98.2°F | Resp 18 | Ht 62.01 in | Wt 148.1 lb

## 2021-09-07 DIAGNOSIS — Z791 Long term (current) use of non-steroidal anti-inflammatories (NSAID): Secondary | ICD-10-CM | POA: Diagnosis not present

## 2021-09-07 DIAGNOSIS — C50919 Malignant neoplasm of unspecified site of unspecified female breast: Secondary | ICD-10-CM | POA: Diagnosis not present

## 2021-09-07 DIAGNOSIS — C50912 Malignant neoplasm of unspecified site of left female breast: Secondary | ICD-10-CM | POA: Diagnosis not present

## 2021-09-07 DIAGNOSIS — C773 Secondary and unspecified malignant neoplasm of axilla and upper limb lymph nodes: Secondary | ICD-10-CM | POA: Diagnosis not present

## 2021-09-07 DIAGNOSIS — M810 Age-related osteoporosis without current pathological fracture: Secondary | ICD-10-CM

## 2021-09-07 DIAGNOSIS — I700 Atherosclerosis of aorta: Secondary | ICD-10-CM | POA: Diagnosis not present

## 2021-09-07 DIAGNOSIS — J432 Centrilobular emphysema: Secondary | ICD-10-CM | POA: Diagnosis not present

## 2021-09-07 DIAGNOSIS — Z79899 Other long term (current) drug therapy: Secondary | ICD-10-CM | POA: Diagnosis not present

## 2021-09-07 DIAGNOSIS — Z923 Personal history of irradiation: Secondary | ICD-10-CM | POA: Diagnosis not present

## 2021-09-07 DIAGNOSIS — Z170 Estrogen receptor positive status [ER+]: Secondary | ICD-10-CM | POA: Diagnosis not present

## 2021-09-07 DIAGNOSIS — Z79811 Long term (current) use of aromatase inhibitors: Secondary | ICD-10-CM | POA: Diagnosis not present

## 2021-09-07 DIAGNOSIS — Z9221 Personal history of antineoplastic chemotherapy: Secondary | ICD-10-CM | POA: Diagnosis not present

## 2021-09-07 MED ORDER — ALENDRONATE SODIUM 70 MG PO TABS: ORAL_TABLET | ORAL | 6 refills | Status: DC

## 2021-09-07 MED ORDER — ANASTROZOLE 1 MG PO TABS: ORAL_TABLET | ORAL | 6 refills | Status: DC

## 2021-09-07 NOTE — Progress Notes (Signed)
[facility] [address] [place] [address]   Patient Care Team: Scherrie Bateman as PCP - General (Family Medicine) Felicie Morn, MD (Inactive) as Consulting Physician (General Surgery)  SUMMARY OF ONCOLOGIC HISTORY: Oncology History  Invasive ductal carcinoma metastasized to axillary lymph node, left  05/28/2013 Imaging   Screening mammogram   06/19/2013 Imaging   Left diagnostic mammogram   06/25/2013 Imaging   CT CAP- borderline enlarged axillary lymph node on left.  Small pulmonary nodules noted but too small for PET or biopsy; surveillance recommended.    07/03/2013 Surgery   Left partial mastectomy.  Invasive ductal carcinoma. 1.4 cm.  POSITIVE MARGINS.  Additional extensive DCIS noted.  ER 74%, PR 16%, Ki-67 52%, HER2 NEGATIVE.   07/17/2013 Surgery   Left modified radiacl mastectomy with left axillary node dissection.  Negative residual cancer.  Negative inked margins.  1/12 positive lymph nodes.  FINAL MARGINS NEGATIVE.  LYMPH NODE IS HER2 POSITIVE.   08/13/2013 Imaging   MUGA scan shows left ventricular EF of 62%   08/27/2013 - 12/18/2013 Chemotherapy   TCH x 6 cycles   01/07/2014 -  Chemotherapy   Herceptin x 52 weeks   01/27/2014 - 03/13/2014 Radiation Therapy   By Dr. Pablo Ledger   03/29/2014 -  Anti-estrogen oral therapy   Aromasin   10/24/2014 Imaging   Bone density- BMD as determined from Femur Neck Right is 0.698 g/cm2 with a T-Score of -2.4. This patient is considered osteopenic according to [facility]) criteria. (Lumbar spine was not utilized due to advanced degenerative changes).   11/05/2014 Imaging   MUGA- Left ventricular ejection fraction equals 70%. No change from prior.   04/27/2015 Procedure   Port-A-Cath removal by Dr. Arnoldo Morale   06/19/2015 Imaging   CT chest- Status post left mastectomy, without recurrent or metastatic disease. Decrease in size of right-sided pulmonary nodules, favoring an  infectious or inflammatory etiology.   06/21/2017 Imaging   CT Chest w contrast: IMPRESSION: 1. No evidence of metastatic disease in the thorax. Multiple previously noted tiny pulmonary nodules are stable compared to prior studies and considered benign. 2. Aortic atherosclerosis. 3. Mild diffuse bronchial wall thickening with mild centrilobular and paraseptal emphysema most apparent the lung apices; imaging findings that may suggest underlying COPD.     CHIEF COMPLIANT: Follow-up of ER positive breast cancer   INTERVAL HISTORY: [name] is a 66 y.o. female here today for follow up of her ER positive breast cancer. Her last visit was on 09/29/20222.   Today she reports feeling good. She denies jaw pain and dental issues. She denies trouble swallowing.   REVIEW OF SYSTEMS:   Review of Systems  Constitutional:  Negative for appetite change and fatigue.  HENT:   Negative for trouble swallowing.   All other systems reviewed and are negative.  I have reviewed the past medical history, past surgical history, social history and family history with the patient and they are unchanged from previous note.   ALLERGIES:   has No Known Allergies.   MEDICATIONS:  Current Outpatient Medications  Medication Sig Dispense Refill   alendronate (FOSAMAX) 70 MG tablet TAKE 1 TABLET(70 MG) BY MOUTH 1 TIME A WEEK WITH A FULL GLASS OF WATER AND ON AN EMPTY STOMACH 4 tablet 6   anastrozole (ARIMIDEX) 1 MG tablet TAKE 1 TABLET(1 MG) BY MOUTH DAILY 30 tablet 6   anastrozole (ARIMIDEX) 1 MG tablet TAKE 1 TABLET(1 MG) BY MOUTH  DAILY 30 tablet 6   Calcium Carbonate-Vitamin D (CALCIUM-VITAMIN D3 PO) Take by mouth.     DULoxetine (CYMBALTA) 30 MG capsule Take 30 mg by mouth daily.     hydrochlorothiazide (HYDRODIURIL) 25 MG tablet      ibuprofen (ADVIL) 600 MG tablet Take 600 mg by mouth 3 (three) times daily.     losartan (COZAAR) 100 MG tablet      omeprazole (PRILOSEC) 20 MG capsule Take  20 mg by mouth 2 (two) times daily.      diclofenac (VOLTAREN) 25 MG EC tablet Take 1 tablet (25 mg total) by mouth 2 (two) times daily as needed for moderate pain. (Patient not taking: Reported on 09/07/2021) 20 tablet 0   tiZANidine (ZANAFLEX) 2 MG tablet Take 1-2 tablets (2-4 mg total) by mouth at bedtime and may repeat dose one time if needed. (Patient not taking: Reported on 09/07/2021) 20 tablet 0   traZODone (DESYREL) 50 MG tablet Take 25 mg by mouth at bedtime as needed for sleep. For sleep (Patient not taking: Reported on 09/07/2021)     No current facility-administered medications for this visit.     PHYSICAL EXAMINATION: Performance status (ECOG): 0 - Asymptomatic  Vitals:   09/07/21 1440  BP: 140/76  Pulse: 81  Resp: 18  Temp: 98.2 F (36.8 C)  SpO2: 100%   Wt Readings from Last 3 Encounters:  09/07/21 148 lb 2.4 oz (67.2 kg)  02/24/21 142 lb 6.4 oz (64.6 kg)  08/27/20 150 lb 8 oz (68.3 kg)   Physical Exam Vitals reviewed.  Constitutional:      Appearance: Normal appearance.  Cardiovascular:     Rate and Rhythm: Normal rate and regular rhythm.     Pulses: Normal pulses.     Heart sounds: Normal heart sounds.  Pulmonary:     Effort: Pulmonary effort is normal.     Breath sounds: Normal breath sounds.  Chest:  Breasts:    Right: Normal. No swelling, bleeding, inverted nipple, mass, nipple discharge, skin change or tenderness.     Left: Absent. No swelling, bleeding, mass, skin change (mastectomy site within normal limits) or tenderness.  Lymphadenopathy:     Upper Body:     Right upper body: No supraclavicular, axillary or pectoral adenopathy.     Left upper body: No supraclavicular, axillary or pectoral adenopathy.  Neurological:     General: No focal deficit present.     Mental Status: She is alert and oriented to person, place, and time.  Psychiatric:        Mood and Affect: Mood normal.        Behavior: Behavior normal.    Breast Exam Chaperone: Thana Ates     LABORATORY DATA:  I have reviewed the data as listed CMP Latest Ref Rng & Units 08/31/2021 02/17/2021 07/31/2020  Glucose 70 - 99 mg/dL 120(H) 99 101(H)  BUN 8 - 23 mg/dL '15 22 14  ' Creatinine 0.44 - 1.00 mg/dL 0.85 0.62 0.71  Sodium 135 - 145 mmol/L 141 136 139  Potassium 3.5 - 5.1 mmol/L 3.1(L) 3.6 3.7  Chloride 98 - 111 mmol/L 106 101 103  CO2 22 - 32 mmol/L '26 28 28  ' Calcium 8.9 - 10.3 mg/dL 9.3 9.0 8.9  Total Protein 6.5 - 8.1 g/dL 6.8 6.9 6.5  Total Bilirubin 0.3 - 1.2 mg/dL 0.2(L) 0.4 0.6  Alkaline Phos 38 - 126 U/L 76 56 55  AST 15 - 41 U/L '22 18 18  ' ALT 0 -  44 U/L '25 18 25   ' No results found for: [id number] Lab Results  Component Value Date   WBC 6.4 08/31/2021   HGB 12.5 08/31/2021   HCT 37.5 08/31/2021   MCV 104.5 (H) 08/31/2021   PLT 201 08/31/2021   NEUTROABS 3.8 08/31/2021    ASSESSMENT:  1.  Left breast cancer: -Right modified radical mastectomy on 07/17/2013, 1/12 lymph nodes positive, grade 2, 1.4 cm invasive tumor, margins negative, ER 74% positive, PR 16% positive, HER-2 negative, Ki-67 52%, [id number].  HER-2/neu was positive on testing by [facility] on lymph node. - 6 cycles of [place] from 08/27/2013 through 12/18/2013, 1 year Herceptin completed on 08/28/2014. - Status post XRT completed in July 2015. - Anastrozole started in August 2015.    2.  Osteoporosis: -DEXA scan from 01/09/2019 shows T score of -2.6.  This is slightly worse from the prior scan of -2.5 and -2.4 prior to that. -She received temporarily Prolia on 10/09/2014, 04/09/2015 and 09/28/2015.  This was discontinued secondary to high insurance cost.   PLAN:  Left invasive ductal carcinoma: - She is tolerating anastrozole very well. - Physical examination shows left mastectomy within normal limits.  Right breast has no palpable masses.  No palpable adenopathy. - Reviewed labs from 08/31/2021 which showed normal LFTs and CBC. - Right breast mammogram from 08/09/2021 reviewed by me was BI-RADS Category  1. - RTC 6 months for follow-up.   Osteoporosis: - Labs reviewed by me shows vitamin D level normal.  Continue calcium and vitamin D supplements. - Continue Fosamax 70 mg weekly.    Breast Cancer therapy associated bone loss: I have recommended calcium, Vitamin D and weight bearing exercises.  Orders placed this encounter:  No orders of the defined types were placed in this encounter.   The patient has a good understanding of the overall plan. She agrees with it. She will call with any problems that may develop before the next visit here.  Derek Jack, MD [place] [phone]   I, Thana Ates, am acting as a scribe for Dr. Derek Jack.  I, Derek Jack MD, have reviewed the above documentation for accuracy and completeness, and I agree with the above.

## 2021-09-07 NOTE — Patient Instructions (Addendum)
[place] at [facility] Discharge Instructions  You were seen and examined today by Dr. Delton Coombes. He reviewed your most recent labs and everything looks good. Continue taking the Anastrozole, Vitamin D and Calcium.  Please keep follow up as scheduled in 6 months.   Thank you for choosing [place] at [facility] to provide your oncology and hematology care.  To afford each patient quality time with our provider, please arrive at least 15 minutes before your scheduled appointment time.   If you have a lab appointment with the [facility] please come in thru the Main Entrance and check in at the main information desk.  You need to re-schedule your appointment should you arrive 10 or more minutes late.  We strive to give you quality time with our providers, and arriving late affects you and other patients whose appointments are after yours.  Also, if you no show three or more times for appointments you may be dismissed from the clinic at the providers discretion.     Again, thank you for choosing [facility].  Our hope is that these requests will decrease the amount of time that you wait before being seen by our physicians.       _____________________________________________________________  Should you have questions after your visit to [facility], please contact our office at [phone] and follow the prompts.  Our office hours are 8:00 a.m. and 4:30 p.m. Monday - Friday.  Please note that voicemails left after 4:00 p.m. may not be returned until the following business day.  We are closed weekends and major holidays.  You do have access to a nurse 24-7, just call the main number to the clinic [phone] and do not press any options, hold on the line and a nurse will answer the phone.    For prescription refill requests, have your pharmacy contact our office and allow 72 hours.    Due to Covid, you will need to wear  a mask upon entering the hospital. If you do not have a mask, a mask will be given to you at the Main Entrance upon arrival. For doctor visits, patients may have 1 support person age 94 or older with them. For treatment visits, patients can not have anyone with them due to social distancing guidelines and our immunocompromised population.

## 2021-10-21 ENCOUNTER — Other Ambulatory Visit (HOSPITAL_COMMUNITY): Payer: Self-pay | Admitting: Hematology

## 2021-10-21 DIAGNOSIS — C50912 Malignant neoplasm of unspecified site of left female breast: Secondary | ICD-10-CM

## 2021-10-21 DIAGNOSIS — C773 Secondary and unspecified malignant neoplasm of axilla and upper limb lymph nodes: Secondary | ICD-10-CM

## 2021-11-25 DIAGNOSIS — J069 Acute upper respiratory infection, unspecified: Secondary | ICD-10-CM | POA: Diagnosis not present

## 2021-11-25 DIAGNOSIS — I10 Essential (primary) hypertension: Secondary | ICD-10-CM | POA: Diagnosis not present

## 2022-01-26 DIAGNOSIS — M25579 Pain in unspecified ankle and joints of unspecified foot: Secondary | ICD-10-CM | POA: Diagnosis not present

## 2022-01-26 DIAGNOSIS — G5750 Tarsal tunnel syndrome, unspecified lower limb: Secondary | ICD-10-CM | POA: Diagnosis not present

## 2022-01-26 DIAGNOSIS — M79671 Pain in right foot: Secondary | ICD-10-CM | POA: Diagnosis not present

## 2022-03-07 ENCOUNTER — Inpatient Hospital Stay (HOSPITAL_COMMUNITY): Payer: Medicare Other | Attending: Hematology

## 2022-03-07 DIAGNOSIS — Z87891 Personal history of nicotine dependence: Secondary | ICD-10-CM | POA: Insufficient documentation

## 2022-03-07 DIAGNOSIS — Z79811 Long term (current) use of aromatase inhibitors: Secondary | ICD-10-CM | POA: Insufficient documentation

## 2022-03-07 DIAGNOSIS — M810 Age-related osteoporosis without current pathological fracture: Secondary | ICD-10-CM | POA: Insufficient documentation

## 2022-03-07 DIAGNOSIS — C50919 Malignant neoplasm of unspecified site of unspecified female breast: Secondary | ICD-10-CM

## 2022-03-07 DIAGNOSIS — Z853 Personal history of malignant neoplasm of breast: Secondary | ICD-10-CM | POA: Diagnosis not present

## 2022-03-07 DIAGNOSIS — I10 Essential (primary) hypertension: Secondary | ICD-10-CM | POA: Diagnosis not present

## 2022-03-07 LAB — CBC WITH DIFFERENTIAL/PLATELET
Abs Immature Granulocytes: 0.01 10*3/uL (ref 0.00–0.07)
Basophils Absolute: 0 10*3/uL (ref 0.0–0.1)
Basophils Relative: 0 %
Eosinophils Absolute: 0.1 10*3/uL (ref 0.0–0.5)
Eosinophils Relative: 2 %
HCT: 39 % (ref 36.0–46.0)
Hemoglobin: 12.8 g/dL (ref 12.0–15.0)
Immature Granulocytes: 0 %
Lymphocytes Relative: 34 %
Lymphs Abs: 1.7 10*3/uL (ref 0.7–4.0)
MCH: 34.1 pg — ABNORMAL HIGH (ref 26.0–34.0)
MCHC: 32.8 g/dL (ref 30.0–36.0)
MCV: 104 fL — ABNORMAL HIGH (ref 80.0–100.0)
Monocytes Absolute: 0.4 10*3/uL (ref 0.1–1.0)
Monocytes Relative: 8 %
Neutro Abs: 2.6 10*3/uL (ref 1.7–7.7)
Neutrophils Relative %: 56 %
Platelets: 181 10*3/uL (ref 150–400)
RBC: 3.75 MIL/uL — ABNORMAL LOW (ref 3.87–5.11)
RDW: 12.1 % (ref 11.5–15.5)
WBC: 4.8 10*3/uL (ref 4.0–10.5)
nRBC: 0 % (ref 0.0–0.2)

## 2022-03-07 LAB — COMPREHENSIVE METABOLIC PANEL
ALT: 24 U/L (ref 0–44)
AST: 20 U/L (ref 15–41)
Albumin: 4.3 g/dL (ref 3.5–5.0)
Alkaline Phosphatase: 68 U/L (ref 38–126)
Anion gap: 5 (ref 5–15)
BUN: 15 mg/dL (ref 8–23)
CO2: 30 mmol/L (ref 22–32)
Calcium: 9.4 mg/dL (ref 8.9–10.3)
Chloride: 105 mmol/L (ref 98–111)
Creatinine, Ser: 0.66 mg/dL (ref 0.44–1.00)
GFR, Estimated: >60 mL/min (ref >60)
Glucose, Bld: 103 mg/dL — ABNORMAL HIGH (ref 70–99)
Potassium: 3.4 mmol/L — ABNORMAL LOW (ref 3.5–5.1)
Sodium: 140 mmol/L (ref 135–145)
Total Bilirubin: 0.6 mg/dL (ref 0.3–1.2)
Total Protein: 6.9 g/dL (ref 6.5–8.1)

## 2022-03-07 LAB — VITAMIN D 25 HYDROXY (VIT D DEFICIENCY, FRACTURES): Vit D, 25-Hydroxy: 45.64 ng/mL (ref 30–100)

## 2022-03-12 NOTE — Progress Notes (Unsigned)
[place], [place] [address]   CLINIC:  Medical Oncology/Hematology  PCP:  Scherrie Bateman [address] / [place] [address] [phone]   BRIEF ONCOLOGIC HISTORY:  Oncology History  Invasive ductal carcinoma metastasized to axillary lymph node, left  05/28/2013 Imaging   Screening mammogram   06/19/2013 Imaging   Left diagnostic mammogram   06/25/2013 Imaging   CT CAP- borderline enlarged axillary lymph node on left.  Small pulmonary nodules noted but too small for PET or biopsy; surveillance recommended.    07/03/2013 Surgery   Left partial mastectomy.  Invasive ductal carcinoma. 1.4 cm.  POSITIVE MARGINS.  Additional extensive DCIS noted.  ER 74%, PR 16%, Ki-67 52%, HER2 NEGATIVE.   07/17/2013 Surgery   Left modified radiacl mastectomy with left axillary node dissection.  Negative residual cancer.  Negative inked margins.  1/12 positive lymph nodes.  FINAL MARGINS NEGATIVE.  LYMPH NODE IS HER2 POSITIVE.   08/13/2013 Imaging   MUGA scan shows left ventricular EF of 62%   08/27/2013 - 12/18/2013 Chemotherapy   TCH x 6 cycles   01/07/2014 -  Chemotherapy   Herceptin x 52 weeks   01/27/2014 - 03/13/2014 Radiation Therapy   By Dr. Pablo Ledger   03/29/2014 -  Anti-estrogen oral therapy   Aromasin   10/24/2014 Imaging   Bone density- BMD as determined from Femur Neck Right is 0.698 g/cm2 with a T-Score of -2.4. This patient is considered osteopenic according to [facility]) criteria. (Lumbar spine was not utilized due to advanced degenerative changes).   11/05/2014 Imaging   MUGA- Left ventricular ejection fraction equals 70%. No change from prior.   04/27/2015 Procedure   Port-A-Cath removal by Dr. Arnoldo Morale   06/19/2015 Imaging   CT chest- Status post left mastectomy, without recurrent or metastatic disease. Decrease in size of right-sided pulmonary nodules, favoring an infectious or inflammatory  etiology.   06/21/2017 Imaging   CT Chest w contrast: IMPRESSION: 1. No evidence of metastatic disease in the thorax. Multiple previously noted tiny pulmonary nodules are stable compared to prior studies and considered benign. 2. Aortic atherosclerosis. 3. Mild diffuse bronchial wall thickening with mild centrilobular and paraseptal emphysema most apparent the lung apices; imaging findings that may suggest underlying COPD.     CANCER STAGING: Cancer Staging  Invasive ductal carcinoma metastasized to axillary lymph node, left Staging form: Breast, AJCC 7th Edition - Clinical: Stage IIA (T1c, N1, cM0) - Signed by Baird Cancer, PA-C on 09/15/2013   INTERVAL HISTORY:  [name], a 66 y.o. female, returns for routine follow-up of her history of stage IIa left-sided breast cancer. Antwanette was last seen on 09/07/2021 by Dr. Delton Coombes.   At today's visit, she  reports feeling well.  She denies any recent hospitalizations, surgeries, or changes in her  baseline health status.  She denies any symptoms of recurrence such as new lumps, bone pain, chest pain, dyspnea, or abdominal pain.  She has no new headaches, seizures, or focal neurologic deficits.  No B symptoms such as fever, chills, night sweats, unintentional weight loss.  She continues to take daily Arimidex and is tolerating it fairly well.  She does have mild infrequent hot flashes and complains of some abnormal weight gain since her last visit.  She denies any mood swings, alopecia, or abnormal musculoskeletal pain.  She is taking Fosamax for her osteoporosis in the setting of aromatase inhibitor use.  She denies any recent  bone pain, fractures, jaw pain, or dental issues.    She reports 50% energy and 100% appetite.  She is maintaining stable weight at this time.   REVIEW OF SYSTEMS:  Review of Systems  Constitutional:  Positive for fatigue (baseline). Negative for appetite change, chills, diaphoresis, fever and  unexpected weight change.  HENT:   Positive for sore throat. Negative for lump/mass and nosebleeds.        Nasal congestion  Eyes:  Negative for eye problems.  Respiratory:  Negative for cough, hemoptysis and shortness of breath.   Cardiovascular:  Negative for chest pain, leg swelling and palpitations.  Gastrointestinal:  Negative for abdominal pain, blood in stool, constipation, diarrhea, nausea and vomiting.  Genitourinary:  Negative for hematuria.   Skin: Negative.   Neurological:  Negative for dizziness, headaches and light-headedness.  Hematological:  Does not bruise/bleed easily.    PAST MEDICAL/SURGICAL HISTORY:  Past Medical History:  Diagnosis Date   Anxiety    B12 deficiency 01/27/2016   Barrett's esophagus    Breast cancer ([place])    Breast cancer, left breast ([place])    Bronchitis 07/27/2011   GERD (gastroesophageal reflux disease)    Hypertension    Osteopenia 10/09/2014   Past Surgical History:  Procedure Laterality Date   BREAST BIOPSY Bilateral    X 4- benign   ESOPHAGOGASTRODUODENOSCOPY  08/03/2011   Procedure: ESOPHAGOGASTRODUODENOSCOPY (EGD);  Surgeon: Rogene Houston, MD;  Location: [facility];  Service: Endoscopy;  Laterality: N/A;  12:45   EXCISION MORTON'S NEUROMA  07/14/2011   Procedure: EXCISION MORTON'S NEUROMA;  Surgeon: Marcheta Grammes;  Location: [facility];  Service: Orthopedics;  Laterality: Right;  Removal Neuroma 3rd Interspace Right Foot   MASTECTOMY Left    MASTECTOMY MODIFIED RADICAL Left 07/17/2013   Procedure: MASTECTOMY MODIFIED RADICAL WITH LEFT AXILLARY TISSUE REMOVAL;  Surgeon: Scherry Ran, MD;  Location: [facility];  Service: General;  Laterality: Left;   PARTIAL MASTECTOMY WITH NEEDLE LOCALIZATION Left 07/03/2013   Procedure: PARTIAL MASTECTOMY STATUS POST NEEDLE LOCALIZATION;  Surgeon: Scherry Ran, MD;  Location: [facility];  Service: General;  Laterality: Left;   PORT-A-CATH REMOVAL Right 04/27/2015   Procedure: REMOVAL  PORT-A-CATH;  Surgeon: Aviva Signs, MD;  Location: [facility];  Service: General;  Laterality: Right;   PORTACATH PLACEMENT Right 08/16/2013   Procedure: INSERTION PORT-A-CATH RIGHT SUBCLAVIAN;  Surgeon: Scherry Ran, MD;  Location: [facility];  Service: General;  Laterality: Right;    SOCIAL HISTORY:  Social History   Socioeconomic History   Marital status: Married    Spouse name: Not on file   Number of children: Not on file   Years of education: Not on file   Highest education level: Not on file  Occupational History   Not on file  Tobacco Use   Smoking status: Former    Packs/day: 1.00    Years: 20.00    Total pack years: 20.00    Types: Cigarettes    Quit date: 09/05/1993    Years since quitting: 28.5   Smokeless tobacco: Never  Substance and Sexual Activity   Alcohol use: No    Comment: occasionally   Drug use: No   Sexual activity: Not on file  Other Topics Concern   Not on file  Social History Narrative   Not on file   Social Determinants of Health   Financial Resource Strain: Not on file  Food Insecurity: Not on file  Transportation Needs: Not on file  Physical  Activity: Not on file  Stress: Not on file  Social Connections: Not on file  Intimate Partner Violence: Not on file    FAMILY HISTORY:  Family History  Problem Relation Age of Onset   Anesthesia problems Neg Hx    Hypotension Neg Hx    Malignant hyperthermia Neg Hx    Pseudochol deficiency Neg Hx     CURRENT MEDICATIONS:  Current Outpatient Medications  Medication Sig Dispense Refill   alendronate (FOSAMAX) 70 MG tablet TAKE 1 TABLET(70 MG) BY MOUTH 1 TIME A WEEK WITH A FULL GLASS OF WATER AND ON AN EMPTY STOMACH 4 tablet 6   anastrozole (ARIMIDEX) 1 MG tablet TAKE 1 TABLET(1 MG) BY MOUTH DAILY 30 tablet 6   anastrozole (ARIMIDEX) 1 MG tablet TAKE 1 TABLET(1 MG) BY MOUTH DAILY 30 tablet 6   anastrozole (ARIMIDEX) 1 MG tablet TAKE 1 TABLET(1 MG) BY MOUTH DAILY 30 tablet 6   anastrozole  (ARIMIDEX) 1 MG tablet TAKE 1 TABLET(1 MG) BY MOUTH DAILY 30 tablet 6   Calcium Carbonate-Vitamin D (CALCIUM-VITAMIN D3 PO) Take by mouth.     diclofenac (VOLTAREN) 25 MG EC tablet Take 1 tablet (25 mg total) by mouth 2 (two) times daily as needed for moderate pain. (Patient not taking: Reported on 09/07/2021) 20 tablet 0   DULoxetine (CYMBALTA) 30 MG capsule Take 30 mg by mouth daily.     hydrochlorothiazide (HYDRODIURIL) 25 MG tablet      ibuprofen (ADVIL) 600 MG tablet Take 600 mg by mouth 3 (three) times daily.     losartan (COZAAR) 100 MG tablet      omeprazole (PRILOSEC) 20 MG capsule Take 20 mg by mouth 2 (two) times daily.      tiZANidine (ZANAFLEX) 2 MG tablet Take 1-2 tablets (2-4 mg total) by mouth at bedtime and may repeat dose one time if needed. (Patient not taking: Reported on 09/07/2021) 20 tablet 0   traZODone (DESYREL) 50 MG tablet Take 25 mg by mouth at bedtime as needed for sleep. For sleep (Patient not taking: Reported on 09/07/2021)     No current facility-administered medications for this visit.    ALLERGIES:  No Known Allergies  PHYSICAL EXAM:  Performance status (ECOG): 0 - Asymptomatic  There were no vitals filed for this visit. Wt Readings from Last 3 Encounters:  09/07/21 148 lb 2.4 oz (67.2 kg)  02/24/21 142 lb 6.4 oz (64.6 kg)  08/27/20 150 lb 8 oz (68.3 kg)   Physical Exam Constitutional:      Appearance: Normal appearance. She is obese.  HENT:     Head: Normocephalic and atraumatic.     Mouth/Throat:     Mouth: Mucous membranes are moist.  Eyes:     Extraocular Movements: Extraocular movements intact.     Pupils: Pupils are equal, round, and reactive to light.  Cardiovascular:     Rate and Rhythm: Normal rate and regular rhythm.     Pulses: Normal pulses.     Heart sounds: Normal heart sounds.  Pulmonary:     Effort: Pulmonary effort is normal.     Breath sounds: Normal breath sounds.  Chest:  Breasts:    Right: Normal.     Left: Absent.        Comments: Left mastectomy site within normal limits. No discrete nodules or masses palpated within the right breast. Abdominal:     General: Bowel sounds are normal.     Palpations: Abdomen is soft.     Tenderness:  There is no abdominal tenderness.  Musculoskeletal:        General: No swelling.     Right lower leg: No edema.     Left lower leg: No edema.  Lymphadenopathy:     Cervical: No cervical adenopathy.     Upper Body:     Right upper body: No supraclavicular, axillary or pectoral adenopathy.     Left upper body: No supraclavicular, axillary or pectoral adenopathy.  Skin:    General: Skin is warm and dry.  Neurological:     General: No focal deficit present.     Mental Status: She is alert and oriented to person, place, and time.  Psychiatric:        Mood and Affect: Mood normal.        Behavior: Behavior normal.      LABORATORY DATA:  I have reviewed the labs as listed.     Latest Ref Rng & Units 03/07/2022   10:59 AM 08/31/2021    1:22 PM 02/17/2021    1:01 PM  CBC  WBC 4.0 - 10.5 K/uL 4.8  6.4  5.5   Hemoglobin 12.0 - 15.0 g/dL 12.8  12.5  12.4   Hematocrit 36.0 - 46.0 % 39.0  37.5  37.3   Platelets 150 - 400 K/uL 181  201  181       Latest Ref Rng & Units 03/07/2022   10:59 AM 08/31/2021    1:22 PM 02/17/2021    1:01 PM  CMP  Glucose 70 - 99 mg/dL 103  120  99   BUN 8 - 23 mg/dL '15  15  22   ' Creatinine 0.44 - 1.00 mg/dL 0.66  0.85  0.62   Sodium 135 - 145 mmol/L 140  141  136   Potassium 3.5 - 5.1 mmol/L 3.4  3.1  3.6   Chloride 98 - 111 mmol/L 105  106  101   CO2 22 - 32 mmol/L '30  26  28   ' Calcium 8.9 - 10.3 mg/dL 9.4  9.3  9.0   Total Protein 6.5 - 8.1 g/dL 6.9  6.8  6.9   Total Bilirubin 0.3 - 1.2 mg/dL 0.6  0.2  0.4   Alkaline Phos 38 - 126 U/L 68  76  56   AST 15 - 41 U/L '20  22  18   ' ALT 0 - 44 U/L '24  25  18     ' DIAGNOSTIC IMAGING:  I have independently reviewed the scans and discussed with the patient. No results found.   ASSESSMENT  & PLAN: 1.  Left breast cancer (stage IIa) s/p left modified radical mastectomy - Left modified radical mastectomy on 07/17/2013, 1/12 lymph nodes positive, grade 2, 1.4 cm invasive tumor, margins negative, ER 74% positive, PR 16% positive, HER-2 negative, Ki-67 52%, [id number].  HER-2/neu was positive on testing by [facility] on lymph node. - 6 cycles of [place] from 08/27/2013 through 12/18/2013, 1 year Herceptin completed on 08/28/2014. - Status post XRT completed in July 2015. - Anastrozole started in August 2015.  Goal is 10 years of therapy. - She is tolerating anastrozole very well, apart from some infrequent mild hot flashes and weight gain - Physical examination shows left mastectomy within normal limits.  Right breast has no palpable masses.  No palpable adenopathy.  - No red flag symptoms of recurrence per patient  - Reviewed labs from 03/07/2022 which showed normal LFTs and unremarkable CBC. - Most recent right breast mammogram from  08/09/2021 was BI-RADS Category 1. - PLAN: Mammogram (unilateral, right) in December 2023 - Repeat labs and RTC for physical exam in January 2024. - Continue anastrozole. -- Will check B12 and folate levels in 6 months due to mild macrocytosis without anemia.  2.  Osteoporosis - Most recent DEXA scan (02/17/2021): T score -2.6, which is stable - DEXA scan from 01/09/2019 shows T score of -2.6.  This was slightly worse from the prior scans of -2.5, and -2.4 prior to that. - She received temporarily Prolia on 10/09/2014, 04/09/2015 and 09/28/2015.  This was discontinued secondary to high insurance cost. - She is currently taking Fosamax 70 mg weekly. - No recent fractures, bone pain, jaw pain, or dental issues.   - Most recent labs (03/07/2022): Calcium 9.4, vitamin D 45.64 - PLAN: Continue Fosamax 70 mg weekly.  Continue calcium and vitamin D supplements. - Encourage weight bearing exercises -- Next DEXA in June 2024   [place]: Mammogram and labs in  December 2023 Office visit January 2024  All questions were answered. The patient knows to call the clinic with any problems, questions or concerns.  Medical decision making: Moderate  Time spent on visit: I spent 20 minutes counseling the patient face to face. The total time spent in the appointment was 30 minutes and more than 50% was on counseling.   Harriett Rush, PA-C  03/14/2022 11:25 AM

## 2022-03-14 ENCOUNTER — Inpatient Hospital Stay (HOSPITAL_COMMUNITY): Payer: Medicare Other | Admitting: Physician Assistant

## 2022-03-14 VITALS — BP 138/83 | HR 83 | Temp 98.2°F | Resp 18 | Ht 62.0 in | Wt 152.1 lb

## 2022-03-14 DIAGNOSIS — D7589 Other specified diseases of blood and blood-forming organs: Secondary | ICD-10-CM | POA: Diagnosis not present

## 2022-03-14 DIAGNOSIS — Z853 Personal history of malignant neoplasm of breast: Secondary | ICD-10-CM | POA: Diagnosis not present

## 2022-03-14 DIAGNOSIS — I10 Essential (primary) hypertension: Secondary | ICD-10-CM | POA: Diagnosis not present

## 2022-03-14 DIAGNOSIS — M810 Age-related osteoporosis without current pathological fracture: Secondary | ICD-10-CM

## 2022-03-14 DIAGNOSIS — Z87891 Personal history of nicotine dependence: Secondary | ICD-10-CM | POA: Diagnosis not present

## 2022-03-14 DIAGNOSIS — Z79811 Long term (current) use of aromatase inhibitors: Secondary | ICD-10-CM | POA: Diagnosis not present

## 2022-03-14 DIAGNOSIS — C773 Secondary and unspecified malignant neoplasm of axilla and upper limb lymph nodes: Secondary | ICD-10-CM

## 2022-03-14 DIAGNOSIS — C50912 Malignant neoplasm of unspecified site of left female breast: Secondary | ICD-10-CM | POA: Diagnosis not present

## 2022-03-14 MED ORDER — ANASTROZOLE 1 MG PO TABS: ORAL_TABLET | ORAL | 6 refills | Status: DC

## 2022-03-14 MED ORDER — ALENDRONATE SODIUM 70 MG PO TABS: ORAL_TABLET | ORAL | 6 refills | Status: DC

## 2022-03-14 NOTE — Patient Instructions (Signed)
[place] at [facility] Discharge Instructions  You were seen today by Tarri Abernethy PA-C for your history of left-sided breast cancer.  At this time, you do NOT have any signs of recurrent breast cancer.  Your most recent mammogram was normal and your labs do not show any major abnormalities.  Physical exam today was normal.  LABS: Return in December 2023 for repeat labs  OTHER TESTS: Mammogram in December 2023  MEDICATIONS: (Refill sent to pharmacy) - Continue Arimidex (anastrozole) daily - Continue Fosamax (alendronate) weekly  FOLLOW-UP APPOINTMENT: Office visit January 2024   Thank you for choosing [place] at [facility] to provide your oncology and hematology care.  To afford each patient quality time with our provider, please arrive at least 15 minutes before your scheduled appointment time.   If you have a lab appointment with the [facility] please come in thru the Main Entrance and check in at the main information desk.  You need to re-schedule your appointment should you arrive 10 or more minutes late.  We strive to give you quality time with our providers, and arriving late affects you and other patients whose appointments are after yours.  Also, if you no show three or more times for appointments you may be dismissed from the clinic at the providers discretion.     Again, thank you for choosing [facility].  Our hope is that these requests will decrease the amount of time that you wait before being seen by our physicians.       _____________________________________________________________  Should you have questions after your visit to [facility], please contact our office at [phone] and follow the prompts.  Our office hours are 8:00 a.m. and 4:30 p.m. Monday - Friday.  Please note that voicemails left after 4:00 p.m. may not be returned until the following business day.  We are closed  weekends and major holidays.  You do have access to a nurse 24-7, just call the main number to the clinic [phone] and do not press any options, hold on the line and a nurse will answer the phone.    For prescription refill requests, have your pharmacy contact our office and allow 72 hours.    Due to Covid, you will need to wear a mask upon entering the hospital. If you do not have a mask, a mask will be given to you at the Main Entrance upon arrival. For doctor visits, patients may have 1 support person age 1 or older with them. For treatment visits, patients can not have anyone with them due to social distancing guidelines and our immunocompromised population.

## 2022-03-21 ENCOUNTER — Other Ambulatory Visit (HOSPITAL_COMMUNITY): Payer: Self-pay | Admitting: Hematology

## 2022-03-21 DIAGNOSIS — M810 Age-related osteoporosis without current pathological fracture: Secondary | ICD-10-CM

## 2022-04-05 DIAGNOSIS — M79671 Pain in right foot: Secondary | ICD-10-CM | POA: Diagnosis not present

## 2022-04-05 DIAGNOSIS — M79672 Pain in left foot: Secondary | ICD-10-CM | POA: Diagnosis not present

## 2022-04-05 DIAGNOSIS — B353 Tinea pedis: Secondary | ICD-10-CM | POA: Diagnosis not present

## 2022-04-20 ENCOUNTER — Other Ambulatory Visit: Payer: Self-pay

## 2022-04-20 ENCOUNTER — Ambulatory Visit
Admission: EM | Admit: 2022-04-20 | Discharge: 2022-04-20 | Disposition: A | Discharge status: Home / Self Care | Payer: Medicare Other | Attending: Nurse Practitioner | Admitting: Nurse Practitioner

## 2022-04-20 ENCOUNTER — Ambulatory Visit (INDEPENDENT_AMBULATORY_CARE_PROVIDER_SITE_OTHER): Payer: Medicare Other

## 2022-04-20 ENCOUNTER — Encounter: Payer: Self-pay | Admitting: Emergency Medicine

## 2022-04-20 DIAGNOSIS — M7989 Other specified soft tissue disorders: Secondary | ICD-10-CM | POA: Diagnosis not present

## 2022-04-20 DIAGNOSIS — R2241 Localized swelling, mass and lump, right lower limb: Secondary | ICD-10-CM | POA: Diagnosis not present

## 2022-04-20 DIAGNOSIS — M25571 Pain in right ankle and joints of right foot: Secondary | ICD-10-CM | POA: Diagnosis not present

## 2022-04-20 DIAGNOSIS — M25471 Effusion, right ankle: Secondary | ICD-10-CM

## 2022-04-20 NOTE — Discharge Instructions (Addendum)
Please go to [facility] for an ultrasound of your right lower leg.  The ultrasound will check for a DVT or deep vein thrombosis.  I will call you as soon as the results are received. If the results of the ultrasound are negative, as discussed, you will need to follow-up with your primary care physician for further evaluation. Go to the emergency department immediately if you develop shortness of breath, difficulty breathing, chest pain, or other concerns. Follow-up as needed.

## 2022-04-20 NOTE — ED Triage Notes (Signed)
Pt reports right ankle redness/swelling x2 weeks. Pt reports was seen at foot doctor prior to symptoms starting and was started on lamisil but denies any known injury.   Moderate redness noted to RLE.

## 2022-04-20 NOTE — ED Provider Notes (Signed)
n [facility]    CSN: [id number] Arrival date & time: 04/20/22  1354      History   Chief Complaint Chief Complaint  Patient presents with   Leg Swelling    HPI Heather Vaughan is a 66 y.o. female.   The history is provided by the patient.   Patient presents for complaints of right lower extremity swelling that is been present for the past 2 to 3 weeks.  Patient states that over the past 2 to 3 days, her symptoms have worsened.  She complains of redness to her right lower leg and "shininess".  She denies fever, chills, chest pain, shortness of breath, difficulty breathing, domino pain, calf pain, or inability to bear weight.  Patient also states that she has not injured the right lower extremity prior to her symptoms starting.  Patient reports that she did go see the podiatrist and he removed an area from the bottom of her foot.  She states that she was also started on Lamisil at that time.  She states subsequent to starting the Lamisil, she developed symptoms.  She denies any history of congestive heart failure or peripheral vascular disease.  Patient states that she does have a history of hypertension.  Patient reports that she also has a history of tendon damage to the heel of the right foot.  Past Medical History:  Diagnosis Date   Anxiety    B12 deficiency 01/27/2016   Barrett's esophagus    Breast cancer ([place])    Breast cancer, left breast ([place])    Bronchitis 07/27/2011   GERD (gastroesophageal reflux disease)    Hypertension    Osteopenia 10/09/2014    Patient Active Problem List   Diagnosis Date Noted   B12 deficiency 01/27/2016   Osteoporosis 10/09/2014   Peripheral neuropathy due to chemotherapy ([place]) 05/08/2014   Unstable balance 05/08/2014   Stiffness of joint, not elsewhere classified, ankle and foot 05/08/2014   Ankle weakness 05/08/2014   Invasive ductal carcinoma metastasized to axillary lymph node, left 08/09/2013   Barrett esophagus 12/22/2011    GERD (gastroesophageal reflux disease) 06/23/2011   Hypertension 06/23/2011    Past Surgical History:  Procedure Laterality Date   BREAST BIOPSY Bilateral    X 4- benign   ESOPHAGOGASTRODUODENOSCOPY  08/03/2011   Procedure: ESOPHAGOGASTRODUODENOSCOPY (EGD);  Surgeon: Rogene Houston, MD;  [facility];  Service: Endoscopy;  Laterality: N/A;  12:45   EXCISION MORTON'S NEUROMA  07/14/2011   Procedure: EXCISION MORTON'S NEUROMA;  Surgeon: Marcheta Grammes;  Location: [facility];  Service: Orthopedics;  Laterality: Right;  Removal Neuroma 3rd Interspace Right Foot   MASTECTOMY Left    MASTECTOMY MODIFIED RADICAL Left 07/17/2013   Procedure: MASTECTOMY MODIFIED RADICAL WITH LEFT AXILLARY TISSUE REMOVAL;  Surgeon: Scherry Ran, MD;  Location: [facility];  Service: General;  Laterality: Left;   PARTIAL MASTECTOMY WITH NEEDLE LOCALIZATION Left 07/03/2013   Procedure: PARTIAL MASTECTOMY STATUS POST NEEDLE LOCALIZATION;  Surgeon: Scherry Ran, MD;  Location: [facility];  Service: General;  Laterality: Left;   PORT-A-CATH REMOVAL Right 04/27/2015   Procedure: REMOVAL PORT-A-CATH;  Surgeon: Aviva Signs, MD;  Location: [facility];  Service: General;  Laterality: Right;   PORTACATH PLACEMENT Right 08/16/2013   Procedure: INSERTION PORT-A-CATH RIGHT SUBCLAVIAN;  Surgeon: Scherry Ran, MD;  Location: [facility];  Service: General;  Laterality: Right;    OB History   No obstetric history on file.      Home Medications  Prior to Admission medications   Medication Sig Start Date End Date Taking? Authorizing Provider  alendronate (FOSAMAX) 70 MG tablet TAKE 1 TABLET(70 MG) BY MOUTH 1 TIME A WEEK WITH A FULL GLASS OF WATER AND ON AN EMPTY STOMACH 03/21/22   Derek Jack, MD  anastrozole (ARIMIDEX) 1 MG tablet TAKE 1 TABLET(1 MG) BY MOUTH DAILY 03/14/22   Tarri Abernethy M, PA-C  Calcium Carbonate-Vitamin D (CALCIUM-VITAMIN D3 PO) Take by mouth.    [provider]   diclofenac (VOLTAREN) 25 MG EC tablet Take 1 tablet (25 mg total) by mouth 2 (two) times daily as needed for moderate pain. 03/08/21   Scot Jun, FNP  DULoxetine (CYMBALTA) 30 MG capsule Take 30 mg by mouth daily.    [provider]  hydrochlorothiazide (HYDRODIURIL) 25 MG tablet  12/20/18   [provider]  ibuprofen (ADVIL) 600 MG tablet Take 600 mg by mouth 3 (three) times daily. 05/07/20   [provider]  losartan (COZAAR) 100 MG tablet  12/16/18   [provider]  omeprazole (PRILOSEC) 20 MG capsule Take 20 mg by mouth 2 (two) times daily.  04/14/15   [provider]  tiZANidine (ZANAFLEX) 2 MG tablet Take 1-2 tablets (2-4 mg total) by mouth at bedtime and may repeat dose one time if needed. 03/08/21   Scot Jun, FNP  traZODone (DESYREL) 50 MG tablet Take 25 mg by mouth at bedtime as needed for sleep. For sleep 06/02/11   [provider]    Family History Family History  Problem Relation Age of Onset   Anesthesia problems Neg Hx    Hypotension Neg Hx    Malignant hyperthermia Neg Hx    Pseudochol deficiency Neg Hx     Social History Social History   Tobacco Use   Smoking status: Former    Packs/day: 1.00    Years: 20.00    Total pack years: 20.00    Types: Cigarettes    Quit date: 09/05/1993    Years since quitting: 28.6   Smokeless tobacco: Never  Substance Use Topics   Alcohol use: No    Comment: occasionally   Drug use: No     Allergies   Patient has no known allergies.   Review of Systems Review of Systems Per HPI  Physical Exam Triage Vital Signs ED Triage Vitals  Enc Vitals Group     BP 04/20/22 1419 (!) 153/89     Pulse Rate 04/20/22 1419 (!) 110     Resp 04/20/22 1419 20     Temp 04/20/22 1419 98.1 F (36.7 C)     Temp Source 04/20/22 1419 Oral     SpO2 04/20/22 1419 94 %     Weight --      Height --      Head Circumference --      Peak Flow --      Pain Score 04/20/22 1420 0      Pain Loc --      Pain Edu? --      Excl. in [place]? --    No data found.  Updated Vital Signs BP (!) 153/89 (BP Location: Right Arm)   Pulse (!) 110   Temp 98.1 F (36.7 C) (Oral)   Resp 20   SpO2 94%   Visual Acuity Right Eye Distance:   Left Eye Distance:   Bilateral Distance:    Right Eye Near:   Left Eye Near:    Bilateral Near:  Physical Exam Vitals and nursing note reviewed.  Constitutional:      General: She is not in acute distress.    Appearance: She is well-developed.  HENT:     Head: Normocephalic and atraumatic.  Eyes:     Extraocular Movements: Extraocular movements intact.     Conjunctiva/sclera: Conjunctivae normal.     Pupils: Pupils are equal, round, and reactive to light.  Cardiovascular:     Rate and Rhythm: Normal rate and regular rhythm.     Pulses: Normal pulses.     Heart sounds: Normal heart sounds. No murmur heard. Pulmonary:     Effort: Pulmonary effort is normal. No respiratory distress.     Breath sounds: Normal breath sounds.  Abdominal:     General: Bowel sounds are normal. There is no distension.     Palpations: Abdomen is soft.     Tenderness: There is no abdominal tenderness. There is no guarding or rebound.  Genitourinary:    Vagina: Normal. No vaginal discharge.  Musculoskeletal:        General: No swelling.     Cervical back: Neck supple.     Right lower leg: Swelling present. No deformity or lacerations. 2+ Pitting Edema present.     Left lower leg: No edema.     Right ankle: Swelling present. No deformity or ecchymosis. Normal range of motion.  Skin:    General: Skin is warm and dry.     Capillary Refill: Capillary refill takes less than 2 seconds.     Findings: No erythema or rash.     Comments: Trophic changes noted to the right lower extremity.  Neurological:     Mental Status: She is alert and oriented to person, place, and time.     Cranial Nerves: No cranial nerve deficit.  Psychiatric:        Mood and Affect:  Mood normal.        Behavior: Behavior normal.      UC Treatments / Results  Labs (all labs ordered are listed, but only abnormal results are displayed) Labs Reviewed - No data to display  EKG   Radiology DG Ankle Complete Right  Result Date: 04/20/2022 CLINICAL DATA:  Right ankle swelling and redness for 2-3 weeks. EXAM: RIGHT ANKLE - COMPLETE 3+ VIEW COMPARISON:  Right ankle radiographs 01/16/2014 FINDINGS: No acute fracture, dislocation, or destructive osseous process is identified. A subcentimeter sclerotic focus in the distal tibia is unchanged and likely reflects a bone island. Diffuse soft tissue swelling is noted about the ankle and in the included portion of the lower leg. No radiopaque foreign body is identified. IMPRESSION: Soft tissue swelling without acute osseous abnormality identified. Electronically Signed   By: Logan Bores M.D.   On: 04/20/2022 14:59    Procedures Procedures (including critical care time)  Medications Ordered in UC Medications - No data to display  Initial Impression / Assessment and Plan / UC Course  I have reviewed the triage vital signs and the nursing notes.  Pertinent labs & imaging results that were available during my care of the patient were reviewed by me and considered in my medical decision making (see chart for details).  Patient presents with right lower extremity swelling and pain that started approximately 2 to 3 weeks ago.  Patient denies any known injury or trauma.  Patient has swelling that starts from the lateral aspect of the right ankle that goes into the mid tib-fib of the right lower extremity.  Area has  discoloration, but area is not warm.  Area of swelling has pitting edema.  X-rays were negative for fracture or dislocation.  Recent lab work was performed in July was reviewed.  After speaking with the patient further, patient informs that she did injure the right lower extremity after a fall back in 2017.  Patient states that  that is when she injured the tendon of the right foot.  Previous ultrasound was performed, which was negative at that time.  Symptoms appear to be vascular in nature, will order lower extremity Doppler ultrasound to rule out DVT.  Discussion with patient that if ultrasound is negative, symptoms appear to be consistent with PAD or P VD.  Patient was in agreement with this plan of care.  The patient was directed to [facility] for an ultrasound of the right lower extremity.  Patient was advised that she will need to follow-up with her primary care physician for further evaluation.  Patient was advised that she will be contacted with the ultrasound results. Final Clinical Impressions(s) / UC Diagnoses   Final diagnoses:  Localized swelling of right lower leg  Right ankle swelling     Discharge Instructions      Please go to [facility] for an ultrasound of your right lower leg.  The ultrasound will check for a DVT or deep vein thrombosis.  I will call you as soon as the results are received. If the results of the ultrasound are negative, as discussed, you will need to follow-up with your primary care physician for further evaluation. Go to the emergency department immediately if you develop shortness of breath, difficulty breathing, chest pain, or other concerns. Follow-up as needed.     ED Prescriptions   None    PDMP not reviewed this encounter.   Tish Men, NP 04/20/22 1545

## 2022-04-21 ENCOUNTER — Ambulatory Visit (HOSPITAL_COMMUNITY)
Admission: RE | Admit: 2022-04-21 | Discharge: 2022-04-21 | Disposition: A | Discharge status: Home / Self Care | Payer: Medicare Other | Source: Ambulatory Visit | Attending: Nurse Practitioner | Admitting: Nurse Practitioner

## 2022-04-21 ENCOUNTER — Telehealth: Payer: Self-pay | Admitting: Nurse Practitioner

## 2022-04-21 DIAGNOSIS — M79604 Pain in right leg: Secondary | ICD-10-CM | POA: Insufficient documentation

## 2022-04-21 DIAGNOSIS — R609 Edema, unspecified: Secondary | ICD-10-CM | POA: Insufficient documentation

## 2022-04-21 DIAGNOSIS — Z87891 Personal history of nicotine dependence: Secondary | ICD-10-CM | POA: Insufficient documentation

## 2022-04-21 DIAGNOSIS — Z853 Personal history of malignant neoplasm of breast: Secondary | ICD-10-CM | POA: Diagnosis not present

## 2022-04-21 NOTE — Telephone Encounter (Signed)
Patient completed ultrasound of the right lower extremity.  Call patient to provide results.  Spoke with the patient and inform ultrasound was negative for DVT.  Patient verbalizes understanding.  All questions were answered.  Patient advised to follow-up with her primary care physician as she continues to experience swelling in the right lower leg.

## 2022-05-19 DIAGNOSIS — K449 Diaphragmatic hernia without obstruction or gangrene: Secondary | ICD-10-CM | POA: Diagnosis not present

## 2022-05-19 DIAGNOSIS — G620 Drug-induced polyneuropathy: Secondary | ICD-10-CM | POA: Diagnosis not present

## 2022-05-19 DIAGNOSIS — I10 Essential (primary) hypertension: Secondary | ICD-10-CM | POA: Diagnosis not present

## 2022-05-19 DIAGNOSIS — K219 Gastro-esophageal reflux disease without esophagitis: Secondary | ICD-10-CM | POA: Diagnosis not present

## 2022-05-19 DIAGNOSIS — Z0000 Encounter for general adult medical examination without abnormal findings: Secondary | ICD-10-CM | POA: Diagnosis not present

## 2022-05-23 DIAGNOSIS — Z0000 Encounter for general adult medical examination without abnormal findings: Secondary | ICD-10-CM | POA: Diagnosis not present

## 2022-05-23 DIAGNOSIS — I10 Essential (primary) hypertension: Secondary | ICD-10-CM | POA: Diagnosis not present

## 2022-05-24 DIAGNOSIS — M79671 Pain in right foot: Secondary | ICD-10-CM | POA: Diagnosis not present

## 2022-05-24 DIAGNOSIS — M25579 Pain in unspecified ankle and joints of unspecified foot: Secondary | ICD-10-CM | POA: Diagnosis not present

## 2022-05-24 DIAGNOSIS — G5750 Tarsal tunnel syndrome, unspecified lower limb: Secondary | ICD-10-CM | POA: Diagnosis not present

## 2022-05-26 ENCOUNTER — Other Ambulatory Visit: Payer: Self-pay | Admitting: *Deleted

## 2022-05-26 DIAGNOSIS — M79604 Pain in right leg: Secondary | ICD-10-CM

## 2022-06-02 ENCOUNTER — Ambulatory Visit (HOSPITAL_COMMUNITY)
Admission: RE | Admit: 2022-06-02 | Discharge: 2022-06-02 | Disposition: A | Discharge status: Home / Self Care | Payer: Medicare Other | Source: Ambulatory Visit | Attending: Vascular Surgery | Admitting: Vascular Surgery

## 2022-06-02 DIAGNOSIS — M79605 Pain in left leg: Secondary | ICD-10-CM

## 2022-06-02 DIAGNOSIS — M79604 Pain in right leg: Secondary | ICD-10-CM

## 2022-06-02 NOTE — Progress Notes (Signed)
Office Note     CC: Left lower extremity swelling Requesting Provider:  Jake Samples, PA*  HPI: Heather Vaughan is a 66 y.o. (1956-04-24) female presenting at the request of .Jake Samples, PA-C for left lower extremity edema and concern for peripheral arterial disease  On exam, Heather Vaughan was doing well, accompanied by her husband.  A native of [place], she has lived there for her entire life.  She worked at [employer] for over 25 years, on her feet for the majority of the day, prior to retiring.  Over the last several months, she is appreciated progressive right lower extremity swelling of the calf.  This is worse by days end.  No history of trauma.  No history of peripheral arterial or venous disease. Previous DVT study negative. Heather Vaughan denies symptoms of claudication, ischemic rest pain, tissue loss.  She does note significant edema in the right leg that waxes and wanes with elevation.  This edema is accompanied by itching, swelling, heaviness.  Denies ulcerations or bleeding events.  Past Medical History:  Diagnosis Date   Anxiety    B12 deficiency 01/27/2016   Barrett's esophagus    Breast cancer ([place])    Breast cancer, left breast ([place])    Bronchitis 07/27/2011   GERD (gastroesophageal reflux disease)    Hypertension    Osteopenia 10/09/2014    Past Surgical History:  Procedure Laterality Date   BREAST BIOPSY Bilateral    X 4- benign   ESOPHAGOGASTRODUODENOSCOPY  08/03/2011   Procedure: ESOPHAGOGASTRODUODENOSCOPY (EGD);  Surgeon: Rogene Houston, MD;  [facility];  Service: Endoscopy;  Laterality: N/A;  12:45   EXCISION MORTON'S NEUROMA  07/14/2011   Procedure: EXCISION MORTON'S NEUROMA;  Surgeon: Marcheta Grammes;  Location: [facility];  Service: Orthopedics;  Laterality: Right;  Removal Neuroma 3rd Interspace Right Foot   MASTECTOMY Left    MASTECTOMY MODIFIED RADICAL Left 07/17/2013   Procedure: MASTECTOMY MODIFIED RADICAL WITH LEFT AXILLARY TISSUE  REMOVAL;  Surgeon: Scherry Ran, MD;  Location: [facility];  Service: General;  Laterality: Left;   PARTIAL MASTECTOMY WITH NEEDLE LOCALIZATION Left 07/03/2013   Procedure: PARTIAL MASTECTOMY STATUS POST NEEDLE LOCALIZATION;  Surgeon: Scherry Ran, MD;  Location: [facility];  Service: General;  Laterality: Left;   PORT-A-CATH REMOVAL Right 04/27/2015   Procedure: REMOVAL PORT-A-CATH;  Surgeon: Aviva Signs, MD;  Location: [facility];  Service: General;  Laterality: Right;   PORTACATH PLACEMENT Right 08/16/2013   Procedure: INSERTION PORT-A-CATH RIGHT SUBCLAVIAN;  Surgeon: Scherry Ran, MD;  Location: [facility];  Service: General;  Laterality: Right;    Social History   Socioeconomic History   Marital status: Married    Spouse name: Not on file   Number of children: Not on file   Years of education: Not on file   Highest education level: Not on file  Occupational History   Not on file  Tobacco Use   Smoking status: Former    Packs/day: 1.00    Years: 20.00    Total pack years: 20.00    Types: Cigarettes    Quit date: 09/05/1993    Years since quitting: 28.7   Smokeless tobacco: Never  Substance and Sexual Activity   Alcohol use: No    Comment: occasionally   Drug use: No   Sexual activity: Not on file  Other Topics Concern   Not on file  Social History Narrative   Not on file   Social Determinants of Health  Financial Resource Strain: Not on file  Food Insecurity: Not on file  Transportation Needs: Not on file  Physical Activity: Not on file  Stress: Not on file  Social Connections: Not on file  Intimate Partner Violence: Not on file   Family History  Problem Relation Age of Onset   Anesthesia problems Neg Hx    Hypotension Neg Hx    Malignant hyperthermia Neg Hx    Pseudochol deficiency Neg Hx     Current Outpatient Medications  Medication Sig Dispense Refill   alendronate (FOSAMAX) 70 MG tablet TAKE 1 TABLET(70 MG) BY MOUTH 1 TIME A WEEK WITH A FULL GLASS OF  WATER AND ON AN EMPTY STOMACH 4 tablet 6   anastrozole (ARIMIDEX) 1 MG tablet TAKE 1 TABLET(1 MG) BY MOUTH DAILY 30 tablet 6   Calcium Carbonate-Vitamin D (CALCIUM-VITAMIN D3 PO) Take by mouth.     diclofenac (VOLTAREN) 25 MG EC tablet Take 1 tablet (25 mg total) by mouth 2 (two) times daily as needed for moderate pain. 20 tablet 0   DULoxetine (CYMBALTA) 30 MG capsule Take 30 mg by mouth daily.     hydrochlorothiazide (HYDRODIURIL) 25 MG tablet      ibuprofen (ADVIL) 600 MG tablet Take 600 mg by mouth 3 (three) times daily.     losartan (COZAAR) 100 MG tablet      omeprazole (PRILOSEC) 20 MG capsule Take 20 mg by mouth 2 (two) times daily.      tiZANidine (ZANAFLEX) 2 MG tablet Take 1-2 tablets (2-4 mg total) by mouth at bedtime and may repeat dose one time if needed. 20 tablet 0   traZODone (DESYREL) 50 MG tablet Take 25 mg by mouth at bedtime as needed for sleep. For sleep     No current facility-administered medications for this visit.    No Known Allergies   REVIEW OF SYSTEMS:   '[X]'$  denotes positive finding, '[ ]'$  denotes negative finding Cardiac  Comments:  Chest pain or chest pressure:    Shortness of breath upon exertion:    Short of breath when lying flat:    Irregular heart rhythm:        Vascular    Pain in calf, thigh, or hip brought on by ambulation:    Pain in feet at night that wakes you up from your sleep:     Blood clot in your veins:    Leg swelling:  X       Pulmonary    Oxygen at home:    Productive cough:     Wheezing:         Neurologic    Sudden weakness in arms or legs:     Sudden numbness in arms or legs:     Sudden onset of difficulty speaking or slurred speech:    Temporary loss of vision in one eye:     Problems with dizziness:         Gastrointestinal    Blood in stool:     Vomited blood:         Genitourinary    Burning when urinating:     Blood in urine:        Psychiatric    Major depression:         Hematologic    Bleeding  problems:    Problems with blood clotting too easily:        Skin    Rashes or ulcers:        Constitutional  Fever or chills:      PHYSICAL EXAMINATION:  There were no vitals filed for this visit.  General:  WDWN in NAD; vital signs documented above Gait: Not observed HENT: WNL, normocephalic Pulmonary: normal non-labored breathing , without wheezing Cardiac: regular HR Abdomen: soft, NT, no masses Skin: without rashes Vascular Exam/Pulses:  Right Left  Radial 2+ (normal) 2+ (normal)  Ulnar    Femoral    Popliteal    DP 2+ (normal) 2+ (normal)  PT     Extremities: without ischemic changes, without Gangrene , without cellulitis; without open wounds;  Some hemosiderin deposition appreciated Musculoskeletal: no muscle wasting or atrophy  Neurologic: A&O X 3;  No focal weakness or paresthesias are detected Psychiatric:  The pt has Normal affect.   Non-Invasive Vascular Imaging:    +-------+-----------+-----------+------------+------------+  ABI/TBIToday's ABIToday's TBIPrevious ABIPrevious TBI  +-------+-----------+-----------+------------+------------+  Right  1.15       0.76                                 +-------+-----------+-----------+------------+------------+  Left   1.15       0.97                                 +-------+-----------+-----------+------------+------------+     ASSESSMENT/PLAN: JAMELA CUMBO is a 66 y.o. female presenting with chronic right lower extremity swelling.  ABIs were reviewed demonstrating normal perfusion bilaterally On physical exam, she had a palpable pulse in the foot.  There was significant, pitting edema in the right calf with thin skin and hemosiderin deposition appreciated.  DVT negative, Heather Vaughan's right lower extremity swelling is concerning for chronic venous insufficiency.  She would benefit from right lower extremity venous ultrasound.  She denies classic symptoms associated with lymphedema and did  not have Stemmer sign, buffalo hump.  We discussed the importance of compression stockings which she was sized for today in clinic.   My plan is to see her in 3 months after trial of compression stockings, at which time we will perform a duplex ultrasound of her venous system to assess for chronic venous insufficiency.  Should this be present, and the greater saphenous vein demonstrates significant reflux, I will refer her to one of my colleagues to discuss laser ablation.  Heather Vaughan was asked to call my office should any questions or concerns arise.  Broadus John, MD [facility] [phone]

## 2022-06-03 ENCOUNTER — Encounter: Payer: Self-pay | Admitting: Vascular Surgery

## 2022-06-03 ENCOUNTER — Other Ambulatory Visit: Payer: Self-pay

## 2022-06-03 ENCOUNTER — Ambulatory Visit: Payer: Medicare Other | Admitting: Vascular Surgery

## 2022-06-03 VITALS — BP 154/97 | HR 109 | Temp 98.9°F | Resp 20 | Ht 62.0 in | Wt 149.0 lb

## 2022-06-03 DIAGNOSIS — I872 Venous insufficiency (chronic) (peripheral): Secondary | ICD-10-CM | POA: Diagnosis not present

## 2022-06-03 DIAGNOSIS — M7989 Other specified soft tissue disorders: Secondary | ICD-10-CM

## 2022-06-03 DIAGNOSIS — M79605 Pain in left leg: Secondary | ICD-10-CM

## 2022-08-11 ENCOUNTER — Ambulatory Visit (HOSPITAL_COMMUNITY): Payer: Medicare Other

## 2022-08-12 ENCOUNTER — Ambulatory Visit (HOSPITAL_COMMUNITY)
Admission: RE | Admit: 2022-08-12 | Discharge: 2022-08-12 | Disposition: A | Discharge status: Home / Self Care | Payer: Medicare Other | Source: Ambulatory Visit | Attending: Hematology | Admitting: Hematology

## 2022-08-12 ENCOUNTER — Other Ambulatory Visit (HOSPITAL_COMMUNITY): Payer: Self-pay | Admitting: Hematology

## 2022-08-12 DIAGNOSIS — C50919 Malignant neoplasm of unspecified site of unspecified female breast: Secondary | ICD-10-CM | POA: Diagnosis not present

## 2022-08-12 DIAGNOSIS — C773 Secondary and unspecified malignant neoplasm of axilla and upper limb lymph nodes: Secondary | ICD-10-CM | POA: Insufficient documentation

## 2022-08-12 DIAGNOSIS — Z1231 Encounter for screening mammogram for malignant neoplasm of breast: Secondary | ICD-10-CM | POA: Insufficient documentation

## 2022-08-12 DIAGNOSIS — M810 Age-related osteoporosis without current pathological fracture: Secondary | ICD-10-CM

## 2022-08-12 DIAGNOSIS — C50912 Malignant neoplasm of unspecified site of left female breast: Secondary | ICD-10-CM

## 2022-09-08 ENCOUNTER — Inpatient Hospital Stay: Payer: Medicare Other | Attending: Hematology

## 2022-09-08 DIAGNOSIS — I700 Atherosclerosis of aorta: Secondary | ICD-10-CM | POA: Diagnosis not present

## 2022-09-08 DIAGNOSIS — K219 Gastro-esophageal reflux disease without esophagitis: Secondary | ICD-10-CM | POA: Diagnosis not present

## 2022-09-08 DIAGNOSIS — D7589 Other specified diseases of blood and blood-forming organs: Secondary | ICD-10-CM

## 2022-09-08 DIAGNOSIS — D509 Iron deficiency anemia, unspecified: Secondary | ICD-10-CM | POA: Insufficient documentation

## 2022-09-08 DIAGNOSIS — C50912 Malignant neoplasm of unspecified site of left female breast: Secondary | ICD-10-CM | POA: Insufficient documentation

## 2022-09-08 DIAGNOSIS — Z79899 Other long term (current) drug therapy: Secondary | ICD-10-CM | POA: Insufficient documentation

## 2022-09-08 DIAGNOSIS — M810 Age-related osteoporosis without current pathological fracture: Secondary | ICD-10-CM | POA: Diagnosis not present

## 2022-09-08 DIAGNOSIS — Z923 Personal history of irradiation: Secondary | ICD-10-CM | POA: Insufficient documentation

## 2022-09-08 DIAGNOSIS — I10 Essential (primary) hypertension: Secondary | ICD-10-CM | POA: Insufficient documentation

## 2022-09-08 DIAGNOSIS — Z9221 Personal history of antineoplastic chemotherapy: Secondary | ICD-10-CM | POA: Insufficient documentation

## 2022-09-08 DIAGNOSIS — J432 Centrilobular emphysema: Secondary | ICD-10-CM | POA: Insufficient documentation

## 2022-09-08 DIAGNOSIS — C773 Secondary and unspecified malignant neoplasm of axilla and upper limb lymph nodes: Secondary | ICD-10-CM | POA: Insufficient documentation

## 2022-09-08 DIAGNOSIS — Z170 Estrogen receptor positive status [ER+]: Secondary | ICD-10-CM | POA: Insufficient documentation

## 2022-09-08 DIAGNOSIS — Z79811 Long term (current) use of aromatase inhibitors: Secondary | ICD-10-CM | POA: Insufficient documentation

## 2022-09-08 DIAGNOSIS — I872 Venous insufficiency (chronic) (peripheral): Secondary | ICD-10-CM | POA: Insufficient documentation

## 2022-09-08 DIAGNOSIS — E538 Deficiency of other specified B group vitamins: Secondary | ICD-10-CM | POA: Insufficient documentation

## 2022-09-08 LAB — COMPREHENSIVE METABOLIC PANEL
ALT: 19 U/L (ref 0–44)
AST: 21 U/L (ref 15–41)
Albumin: 4.1 g/dL (ref 3.5–5.0)
Alkaline Phosphatase: 80 U/L (ref 38–126)
Anion gap: 8 (ref 5–15)
BUN: 20 mg/dL (ref 8–23)
CO2: 27 mmol/L (ref 22–32)
Calcium: 9.2 mg/dL (ref 8.9–10.3)
Chloride: 104 mmol/L (ref 98–111)
Creatinine, Ser: 0.73 mg/dL (ref 0.44–1.00)
GFR, Estimated: >60 mL/min (ref >60)
Glucose, Bld: 107 mg/dL — ABNORMAL HIGH (ref 70–99)
Potassium: 3.4 mmol/L — ABNORMAL LOW (ref 3.5–5.1)
Sodium: 139 mmol/L (ref 135–145)
Total Bilirubin: 0.5 mg/dL (ref 0.3–1.2)
Total Protein: 7.1 g/dL (ref 6.5–8.1)

## 2022-09-08 LAB — CBC WITH DIFFERENTIAL/PLATELET
Abs Immature Granulocytes: 0.01 10*3/uL (ref 0.00–0.07)
Basophils Absolute: 0 10*3/uL (ref 0.0–0.1)
Basophils Relative: 1 %
Eosinophils Absolute: 0.2 10*3/uL (ref 0.0–0.5)
Eosinophils Relative: 3 %
HCT: 38.4 % (ref 36.0–46.0)
Hemoglobin: 12.7 g/dL (ref 12.0–15.0)
Immature Granulocytes: 0 %
Lymphocytes Relative: 24 %
Lymphs Abs: 1.6 10*3/uL (ref 0.7–4.0)
MCH: 33.9 pg (ref 26.0–34.0)
MCHC: 33.1 g/dL (ref 30.0–36.0)
MCV: 102.4 fL — ABNORMAL HIGH (ref 80.0–100.0)
Monocytes Absolute: 0.5 10*3/uL (ref 0.1–1.0)
Monocytes Relative: 8 %
Neutro Abs: 4.2 10*3/uL (ref 1.7–7.7)
Neutrophils Relative %: 64 %
Platelets: 210 10*3/uL (ref 150–400)
RBC: 3.75 MIL/uL — ABNORMAL LOW (ref 3.87–5.11)
RDW: 12.4 % (ref 11.5–15.5)
WBC: 6.6 10*3/uL (ref 4.0–10.5)
nRBC: 0 % (ref 0.0–0.2)

## 2022-09-08 LAB — IRON AND TIBC
Iron: 146 ug/dL (ref 28–170)
Saturation Ratios: 34 % — ABNORMAL HIGH (ref 10.4–31.8)
TIBC: 431 ug/dL (ref 250–450)
UIBC: 285 ug/dL

## 2022-09-08 LAB — FOLATE: Folate: 19.4 ng/mL (ref >5.9)

## 2022-09-08 LAB — FERRITIN: Ferritin: 17 ng/mL (ref 11–307)

## 2022-09-08 LAB — VITAMIN B12: Vitamin B-12: 275 pg/mL (ref 180–914)

## 2022-09-08 LAB — VITAMIN D 25 HYDROXY (VIT D DEFICIENCY, FRACTURES): Vit D, 25-Hydroxy: 33.46 ng/mL (ref 30–100)

## 2022-09-09 ENCOUNTER — Encounter (HOSPITAL_COMMUNITY): Payer: Medicare Other

## 2022-09-09 ENCOUNTER — Encounter: Payer: Medicare Other | Admitting: Vascular Surgery

## 2022-09-09 ENCOUNTER — Ambulatory Visit: Payer: Medicare Other | Admitting: Vascular Surgery

## 2022-09-12 LAB — METHYLMALONIC ACID, SERUM: Methylmalonic Acid, Quantitative: 591 nmol/L — ABNORMAL HIGH (ref 0–378)

## 2022-09-13 NOTE — Progress Notes (Signed)
Office Note   HPI: Heather Vaughan is a 67 y.o. (September 13, 1955) female presenting in follow-up with right lower extremity edema and concern for chronic venous insufficiency.    On exam today, Heather Vaughan was doing well, accompanied by her Heather Vaughan.  In short, she is a Heather Vaughan, and Vaughan lived there for her entire life.  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  In August of this year, she appreciated progressive swelling in the right lower extremity.  This is worse by days and.  No history of trauma no history of peripheral arterial or venous disease.  Interestingly, in chart review prior to this visit, I found that she Vaughan had a history of waning right lower extremity swelling that appears to be idiopathic.  Heather Vaughan describes the swelling is worse by days end.  Heather Vaughan is contained to the calf, and does not involve the foot.  Since her visit, she Vaughan been wearing compression stockings, which Vaughan been beneficial, however she does not like them.  She does note some itching, heaviness, but denies ulcerations or bleeding events.  She Vaughan a small varicosity in the popliteal fossa.   Past Medical History:  Diagnosis Date   Anxiety    B12 deficiency 01/27/2016   Barrett's esophagus    Breast cancer ([place])    Breast cancer, left breast ([place])    Bronchitis 07/27/2011   GERD (gastroesophageal reflux disease)    Hypertension    Osteopenia 10/09/2014    Past Surgical History:  Procedure Laterality Date   BREAST BIOPSY Bilateral    X 4- benign   ESOPHAGOGASTRODUODENOSCOPY  08/03/2011   Procedure: ESOPHAGOGASTRODUODENOSCOPY (EGD);  Surgeon: Rogene Houston, MD;  [facility];  Service: Endoscopy;  Laterality: N/A;  12:45   EXCISION MORTON'S NEUROMA  07/14/2011   Procedure: EXCISION MORTON'S NEUROMA;  Surgeon: Marcheta Grammes;  Location: [facility];  Service: Orthopedics;  Laterality: Right;  Removal Neuroma 3rd Interspace Right Foot    MASTECTOMY Left    MASTECTOMY MODIFIED RADICAL Left 07/17/2013   Procedure: MASTECTOMY MODIFIED RADICAL WITH LEFT AXILLARY TISSUE REMOVAL;  Surgeon: Scherry Ran, MD;  Location: [facility];  Service: General;  Laterality: Left;   PARTIAL MASTECTOMY WITH NEEDLE LOCALIZATION Left 07/03/2013   Procedure: PARTIAL MASTECTOMY STATUS POST NEEDLE LOCALIZATION;  Surgeon: Scherry Ran, MD;  Location: [facility];  Service: General;  Laterality: Left;   PORT-A-CATH REMOVAL Right 04/27/2015   Procedure: REMOVAL PORT-A-CATH;  Surgeon: Aviva Signs, MD;  Location: [facility];  Service: General;  Laterality: Right;   PORTACATH PLACEMENT Right 08/16/2013   Procedure: INSERTION PORT-A-CATH RIGHT SUBCLAVIAN;  Surgeon: Scherry Ran, MD;  Location: [facility];  Service: General;  Laterality: Right;    Social History   Socioeconomic History   Marital status: Married    Spouse name: Not on file   Number of children: Not on file   Years of education: Not on file   Highest education level: Not on file  Occupational History   Not on file  Tobacco Use   Smoking status: Former    Packs/day: 1.00    Years: 20.00    Total pack years: 20.00    Types: Cigarettes    Quit date: 09/05/1993    Years since quitting: 29.0   Smokeless tobacco: Never  Substance and Sexual Activity   Alcohol use: No    Comment: occasionally   Drug use: No  Sexual activity: Not on file  Other Topics Concern   Not on file  Social History Narrative   Not on file   Social Determinants of Health   Financial Resource Strain: Not on file  Food Insecurity: Not on file  Transportation Needs: Not on file  Physical Activity: Not on file  Stress: Not on file  Social Connections: Not on file  Intimate Partner Violence: Not on file   Family History  Problem Relation Age of Onset   Anesthesia problems Neg Hx    Hypotension Neg Hx    Malignant hyperthermia Neg Hx    Pseudochol deficiency Neg Hx     Current Outpatient Medications   Medication Sig Dispense Refill   alendronate (FOSAMAX) 70 MG tablet TAKE 1 TABLET(70 MG) BY MOUTH 1 TIME A WEEK WITH A FULL GLASS OF WATER AND ON AN EMPTY STOMACH 4 tablet 6   anastrozole (ARIMIDEX) 1 MG tablet TAKE 1 TABLET(1 MG) BY MOUTH DAILY 30 tablet 6   Calcium Carbonate-Vitamin D (CALCIUM-VITAMIN D3 PO) Take by mouth.     diclofenac (VOLTAREN) 25 MG EC tablet Take 1 tablet (25 mg total) by mouth 2 (two) times daily as needed for moderate pain. 20 tablet 0   DULoxetine (CYMBALTA) 30 MG capsule Take 30 mg by mouth daily.     ibuprofen (ADVIL) 600 MG tablet Take 600 mg by mouth 3 (three) times daily.     losartan-hydrochlorothiazide (HYZAAR) 100-25 MG tablet Take 1 tablet by mouth daily.     omeprazole (PRILOSEC) 20 MG capsule Take 20 mg by mouth 2 (two) times daily.      traZODone (DESYREL) 50 MG tablet Take 25 mg by mouth at bedtime as needed for sleep. For sleep     No current facility-administered medications for this visit.    No Known Allergies   REVIEW OF SYSTEMS:   '[X]'$  denotes positive finding, '[ ]'$  denotes negative finding Cardiac  Comments:  Chest pain or chest pressure:    Shortness of breath upon exertion:    Short of breath when lying flat:    Irregular heart rhythm:        Vascular    Pain in calf, thigh, or hip brought on by ambulation:    Pain in feet at night that wakes you up from your sleep:     Blood clot in your veins:    Leg swelling:  X       Pulmonary    Oxygen at home:    Productive cough:     Wheezing:         Neurologic    Sudden weakness in arms or legs:     Sudden numbness in arms or legs:     Sudden onset of difficulty speaking or slurred speech:    Temporary loss of vision in one eye:     Problems with dizziness:         Gastrointestinal    Blood in stool:     Vomited blood:         Genitourinary    Burning when urinating:     Blood in urine:        Psychiatric    Major depression:         Hematologic    Bleeding problems:     Problems with blood clotting too easily:        Skin    Rashes or ulcers:        Constitutional  Fever or chills:      PHYSICAL EXAMINATION:  There were no vitals filed for this visit.  General:  WDWN in NAD; vital signs documented above Gait: Not observed HENT: WNL, normocephalic Pulmonary: normal non-labored breathing , without wheezing Cardiac: regular HR Abdomen: soft, NT, no masses Skin: without rashes Vascular Exam/Pulses:  Right Left  Radial 2+ (normal) 2+ (normal)  Ulnar    Femoral    Popliteal    DP 2+ (normal) 2+ (normal)  PT     Extremities: without ischemic changes, without Gangrene , without cellulitis; without open wounds;  Some hemosiderin deposition appreciated Musculoskeletal: no muscle wasting or atrophy  Neurologic: A&O X 3;  No focal weakness or paresthesias are detected Psychiatric:  The pt Vaughan Normal affect.   Non-Invasive Vascular Imaging:    +-------+-----------+-----------+------------+------------+  ABI/TBIToday's ABIToday's TBIPrevious ABIPrevious TBI  +-------+-----------+-----------+------------+------------+  Right  1.15       0.76                                 +-------+-----------+-----------+------------+------------+  Left   1.15       0.97                                 +-------+-----------+-----------+------------+------------+     ASSESSMENT/PLAN: ALEXZA NORBECK is a 67 y.o. female presenting with chronic right lower extremity swelling.  ABIs at her last visit demonstrated normal perfusion bilaterally.  To today's visit, Heather Vaughan underwent right lower extremity venous insufficiency studies.  Demonstrated no deep venous insufficiency, and only focal, superficial venous insufficiency at the knee.  There is a greater saphenous vein branch, that is tortuous, that appears to have some insufficiency as well.  I had a long conversation with Heather Vaughan regarding the above.  Treatment modalities for  chronic venous insufficiency include compression stocking versus surgical management-being venous ablation.  Interestingly, Heather Vaughan physical exam findings of chronic venous insufficiency however this is incongruent with her duplex ultrasound study which demonstrates hardly any reflux in the deep or superficial system.  Heather Vaughan Vaughan no history of trauma, DVT, cellulitis.  Heather Vaughan was obviously upset when I told her that the best treatment for her would likely be compression stockings.  I do not think that venous ablation would provide significant benefit as there is little to no reflux.  I do not have an etiology as to her swelling at this time.  The distribution is inconsistent with lymphedema.  I have not evaluated pelvic outflow, however I would expect the leg swelling to encompass the thigh if this were the etiology.  I plan to talk to my partner Dr. Gae Gallop regarding Heather Vaughan as it is atypical.  I will call with Heather Vaughan in 2 weeks to follow up our discussion.  She was asked to continue compression stockings at this time and call my office should any questions or concerns arise. Heather Vaughan was also asked to call my office should any questions or concerns arise.  Broadus John, MD [facility] [phone]

## 2022-09-14 NOTE — Progress Notes (Signed)
[facility] [place], [place] [address]   CLINIC:  Medical Oncology/Hematology  PCP:  Heather Vaughan Vaughan [address] / [place] [address] [phone]   BRIEF ONCOLOGIC HISTORY:   Oncology History  Invasive ductal carcinoma metastasized to axillary lymph node, left  05/28/2013 Imaging   Screening mammogram   06/19/2013 Imaging   Left diagnostic mammogram   06/25/2013 Imaging   CT CAP- borderline enlarged axillary lymph node on left.  Small pulmonary nodules noted but too small for PET or biopsy; surveillance recommended.    07/03/2013 Surgery   Left partial mastectomy.  Invasive ductal carcinoma. 1.4 cm.  POSITIVE MARGINS.  Additional extensive DCIS noted.  ER 74%, PR 16%, Ki-67 52%, HER2 NEGATIVE.   07/17/2013 Surgery   Left modified radiacl mastectomy with left axillary node dissection.  Negative residual Vaughan.  Negative inked margins.  1/12 positive lymph nodes.  FINAL MARGINS NEGATIVE.  LYMPH NODE IS HER2 POSITIVE.   08/13/2013 Imaging   MUGA scan shows left ventricular EF of 62%   08/27/2013 - 12/18/2013 Chemotherapy   TCH x 6 cycles   01/07/2014 -  Chemotherapy   Herceptin x 52 weeks   01/27/2014 - 03/13/2014 Radiation Therapy   By Dr. Pablo Vaughan   03/29/2014 -  Anti-estrogen oral therapy   Aromasin   10/24/2014 Imaging   Bone density- BMD as determined from Femur Neck Right is 0.698 g/cm2 with a T-Score of -2.4. This patient is considered osteopenic according to [facility]) criteria. (Lumbar spine was not utilized due to advanced degenerative changes).   11/05/2014 Imaging   MUGA- Left ventricular ejection fraction equals 70%. No change from prior.   04/27/2015 Procedure   Port-A-Cath removal by Dr. Arnoldo Vaughan   06/19/2015 Imaging   CT chest- Status post left mastectomy, without recurrent or metastatic disease. Decrease in size of right-sided pulmonary nodules, favoring an infectious or inflammatory  etiology.   06/21/2017 Imaging   CT Chest w contrast: IMPRESSION: 1. No evidence of metastatic disease in the thorax. Multiple previously noted tiny pulmonary nodules are stable compared to prior studies and considered benign. 2. Aortic atherosclerosis. 3. Mild diffuse bronchial wall thickening with mild centrilobular and paraseptal emphysema most apparent the lung apices; imaging findings that may suggest underlying COPD.     Vaughan STAGING: Vaughan Staging  Invasive ductal carcinoma metastasized to axillary lymph node, left Staging form: Breast, AJCC 7th Edition - Clinical: Stage IIA (T1c, N1, cM0) - Signed by Heather Cancer, PA-C on 09/15/2013   INTERVAL HISTORY:   Heather Vaughan Vaughan, a 67 y.o. female, returns for routine follow-up of her  history of stage IIa left-sided breast Vaughan. Heather Vaughan was last seen on 03/14/2022 by Heather Abernethy PA-C.   At today's visit, she reports feeling well.  She was recently diagnosed with venous reflux in her), which has been causing some swelling and pain.  Otherwise, she denies any recent hospitalizations, surgeries, or changes in her  baseline health status.  She denies any symptoms of recurrence such as new lumps, bone pain, chest pain, dyspnea, or abdominal pain.  She has no new headaches, seizures, or focal neurologic deficits.  No B symptoms such as fever, chills, night sweats, unintentional weight loss.   She continues to take daily Arimidex and is tolerating it fairly well.  She does have mild infrequent hot flashes.  She denies any mood swings, alopecia, or abnormal musculoskeletal pain.  She is taking Fosamax for her osteoporosis  in the setting of aromatase inhibitor use.  She denies any recent bone pain, fractures, jaw pain, or dental issues.  She denies any rectal bleeding, melena, or other signs of blood loss.  She denies any ice pica, but does report some mild fatigue over the past few months.   She reports 75% energy and  100% appetite.  She is maintaining stable weight at this time.   ASSESSMENT & PLAN:  1.  Left breast Vaughan (stage IIa) s/p left modified radical mastectomy - Left modified radical mastectomy on 07/17/2013, 1/12 lymph nodes positive, grade 2, 1.4 cm invasive tumor, margins negative, ER 74% positive, PR 16% positive, HER-2 negative, Ki-67 52%, [id number].  HER-2/neu was positive on testing by [facility] on lymph node. - 6 cycles of [place] from 08/27/2013 through 12/18/2013, 1 year Herceptin completed on 08/28/2014. - Status post XRT completed in July 2015. - Anastrozole started in August 2015.  Goal is 10 years of therapy. - She is tolerating anastrozole very well, apart from some infrequent mild hot flashes and weight gain - Physical examination shows left mastectomy within normal limits.  Right breast has no palpable masses.  No palpable adenopathy. - No red flag symptoms of recurrence per patient - Reviewed labs from 09/08/2022 which showed normal LFTs and baseline CBC.   - Most recent right breast mammogram from 08/12/2022 was BI-RADS Category 1, negative - PLAN: Labs and RTC for physical exam in 6 months (July 2024) - Next mammogram (unilateral, right) in December 2024 - Repeat labs and RTC for physical exam in January 2024 - Continue anastrozole.  2.  Vitamin B12 deficiency - Patient has chronic mild macrocytosis - Nutritional panel checked on 09/08/2022 showed marginal vitamin B12 at 275, with elevated MMA 591.  Normal folate. - PLAN: Vitamin B12 injection today.  START taking vitamin B12 500 mcg daily.  Recheck B12/MMA in 6 months.  3.  Iron deficiency without anemia - Nutritional panel from 09/08/2022 revealed low ferritin at 17, iron saturation 34% - No bright red blood per rectum or melena  - She reports mild fatigue.  No pica. - PLAN: Recommend starting ferrous sulfate 325 mg daily.   We will recheck iron panel in 6 months.  4.  Osteoporosis - Most recent DEXA scan (02/17/2021): T score  -2.6, which is stable - DEXA scan from 01/09/2019 shows T score of -2.6.  This was slightly worse from the prior scans of -2.5, and -2.4 prior to that. - She received temporarily Prolia on 10/09/2014, 04/09/2015 and 09/28/2015.  This was discontinued secondary to high insurance cost. - She is currently taking Fosamax 70 mg weekly. - No recent fractures, bone pain, jaw pain, or dental issues.   - Most recent labs (09/08/2022): Calcium 9.2, vitamin D 33.46 - PLAN: Continue Fosamax 70 mg weekly.  Continue calcium and vitamin D supplements. - Encourage weight bearing exercises -- Next DEXA in June 2024  PLAN SUMMARY: >> B12 injection TODAY.  Start iron and B12 supplements at home. >> Labs in 6 months (CBC/D, CMP, B12, MMA, ferritin, iron/TIBC, vitamin D) >> Bone density scan in 6 months >> OFFICE visit in 6 months, 1 week after labs    REVIEW OF SYSTEMS:   Review of Systems  Constitutional:  Positive for fatigue (baseline). Negative for appetite change, chills, diaphoresis, fever and unexpected weight change.  HENT:   Negative for lump/mass, nosebleeds and sore throat.        Nasal congestion  Eyes:  Negative for eye problems.  Respiratory:  Negative for cough, hemoptysis and shortness of breath.   Cardiovascular:  Positive for leg swelling (Right leg swelling from venous reflux). Negative for chest pain and palpitations.  Gastrointestinal:  Negative for abdominal pain, blood in stool, constipation, diarrhea, nausea and vomiting.  Genitourinary:  Negative for hematuria.   Skin: Negative.   Neurological:  Negative for dizziness, headaches and light-headedness.  Hematological:  Does not bruise/bleed easily.    PHYSICAL EXAM:   Performance status (ECOG): 1 - Symptomatic but completely ambulatory  There were no vitals filed for this visit. Wt Readings from Last 3 Encounters:  06/03/22 149 lb (67.6 kg)  03/14/22 152 lb 1.9 oz (69 kg)  09/07/21 148 lb 2.4 oz (67.2 kg)   Physical  Exam Constitutional:      Appearance: Normal appearance. She is obese.  HENT:     Head: Normocephalic and atraumatic.     Mouth/Throat:     Mouth: Mucous membranes are moist.  Eyes:     Extraocular Movements: Extraocular movements intact.     Pupils: Pupils are equal, round, and reactive to light.  Cardiovascular:     Rate and Rhythm: Normal rate and regular rhythm.     Pulses: Normal pulses.     Heart sounds: Normal heart sounds.  Pulmonary:     Effort: Pulmonary effort is normal.     Breath sounds: Normal breath sounds.  Chest:  Breasts:    Right: Normal.     Left: Absent.       Comments: Left mastectomy site within normal limits. No discrete nodules or masses palpated within the right breast. Abdominal:     General: Bowel sounds are normal.     Palpations: Abdomen is soft.     Tenderness: There is no abdominal tenderness.  Musculoskeletal:        General: No swelling.     Right lower leg: No edema.     Left lower leg: No edema.  Lymphadenopathy:     Cervical: No cervical adenopathy.     Upper Body:     Right upper body: No supraclavicular, axillary or pectoral adenopathy.     Left upper body: No supraclavicular, axillary or pectoral adenopathy.  Skin:    General: Skin is warm and dry.  Neurological:     General: No focal deficit present.     Mental Status: She is alert and oriented to person, place, and time.  Psychiatric:        Mood and Affect: Mood normal.        Behavior: Behavior normal.      PAST MEDICAL/SURGICAL HISTORY:  Past Medical History:  Diagnosis Date   Anxiety    B12 deficiency 01/27/2016   Barrett's esophagus    Breast Vaughan ([place])    Breast Vaughan, left breast ([place])    Bronchitis 07/27/2011   GERD (gastroesophageal reflux disease)    Hypertension    Osteopenia 10/09/2014   Past Surgical History:  Procedure Laterality Date   BREAST BIOPSY Bilateral    X 4- benign   ESOPHAGOGASTRODUODENOSCOPY  08/03/2011   Procedure:  ESOPHAGOGASTRODUODENOSCOPY (EGD);  Surgeon: Rogene Houston, MD;  Location: [facility];  Service: Endoscopy;  Laterality: N/A;  12:45   EXCISION MORTON'S NEUROMA  07/14/2011   Procedure: EXCISION MORTON'S NEUROMA;  Surgeon: Marcheta Grammes;  Location: [facility];  Service: Orthopedics;  Laterality: Right;  Removal Neuroma 3rd Interspace Right Foot   MASTECTOMY Left    MASTECTOMY MODIFIED RADICAL Left 07/17/2013  Procedure: MASTECTOMY MODIFIED RADICAL WITH LEFT AXILLARY TISSUE REMOVAL;  Surgeon: Scherry Ran, MD;  Location: [facility];  Service: General;  Laterality: Left;   PARTIAL MASTECTOMY WITH NEEDLE LOCALIZATION Left 07/03/2013   Procedure: PARTIAL MASTECTOMY STATUS POST NEEDLE LOCALIZATION;  Surgeon: Scherry Ran, MD;  Location: [facility];  Service: General;  Laterality: Left;   PORT-A-CATH REMOVAL Right 04/27/2015   Procedure: REMOVAL PORT-A-CATH;  Surgeon: Aviva Signs, MD;  Location: [facility];  Service: General;  Laterality: Right;   PORTACATH PLACEMENT Right 08/16/2013   Procedure: INSERTION PORT-A-CATH RIGHT SUBCLAVIAN;  Surgeon: Scherry Ran, MD;  Location: [facility];  Service: General;  Laterality: Right;    SOCIAL HISTORY:  Social History   Socioeconomic History   Marital status: Married    Spouse name: Not on file   Number of children: Not on file   Years of education: Not on file   Highest education level: Not on file  Occupational History   Not on file  Tobacco Use   Smoking status: Former    Packs/day: 1.00    Years: 20.00    Total pack years: 20.00    Types: Cigarettes    Quit date: 09/05/1993    Years since quitting: 29.0   Smokeless tobacco: Never  Substance and Sexual Activity   Alcohol use: No    Comment: occasionally   Drug use: No   Sexual activity: Not on file  Other Topics Concern   Not on file  Social History Narrative   Not on file   Social Determinants of Health   Financial Resource Strain: Not on file  Food Insecurity: Not on file   Transportation Needs: Not on file  Physical Activity: Not on file  Stress: Not on file  Social Connections: Not on file  Intimate Partner Violence: Not on file    FAMILY HISTORY:  Family History  Problem Relation Age of Onset   Anesthesia problems Neg Hx    Hypotension Neg Hx    Malignant hyperthermia Neg Hx    Pseudochol deficiency Neg Hx     CURRENT MEDICATIONS:  Current Outpatient Medications  Medication Sig Dispense Refill   alendronate (FOSAMAX) 70 MG tablet TAKE 1 TABLET(70 MG) BY MOUTH 1 TIME A WEEK WITH A FULL GLASS OF WATER AND ON AN EMPTY STOMACH 4 tablet 6   anastrozole (ARIMIDEX) 1 MG tablet TAKE 1 TABLET(1 MG) BY MOUTH DAILY 30 tablet 6   Calcium Carbonate-Vitamin D (CALCIUM-VITAMIN D3 PO) Take by mouth.     diclofenac (VOLTAREN) 25 MG EC tablet Take 1 tablet (25 mg total) by mouth 2 (two) times daily as needed for moderate pain. 20 tablet 0   DULoxetine (CYMBALTA) 30 MG capsule Take 30 mg by mouth daily.     ibuprofen (ADVIL) 600 MG tablet Take 600 mg by mouth 3 (three) times daily.     losartan-hydrochlorothiazide (HYZAAR) 100-25 MG tablet Take 1 tablet by mouth daily.     omeprazole (PRILOSEC) 20 MG capsule Take 20 mg by mouth 2 (two) times daily.      traZODone (DESYREL) 50 MG tablet Take 25 mg by mouth at bedtime as needed for sleep. For sleep     No current facility-administered medications for this visit.    ALLERGIES:  No Known Allergies  LABORATORY DATA:  I have reviewed the labs as listed.     Latest Ref Rng & Units 09/08/2022   10:56 AM 03/07/2022   10:59 AM 08/31/2021    1:22  PM  CBC  WBC 4.0 - 10.5 K/uL 6.6  4.8  6.4   Hemoglobin 12.0 - 15.0 g/dL 12.7  12.8  12.5   Hematocrit 36.0 - 46.0 % 38.4  39.0  37.5   Platelets 150 - 400 K/uL 210  181  201       Latest Ref Rng & Units 09/08/2022   10:56 AM 03/07/2022   10:59 AM 08/31/2021    1:22 PM  CMP  Glucose 70 - 99 mg/dL 107  103  120   BUN 8 - 23 mg/dL '20  15  15   '$ Creatinine 0.44 - 1.00 mg/dL  0.73  0.66  0.85   Sodium 135 - 145 mmol/L 139  140  141   Potassium 3.5 - 5.1 mmol/L 3.4  3.4  3.1   Chloride 98 - 111 mmol/L 104  105  106   CO2 22 - 32 mmol/L '27  30  26   '$ Calcium 8.9 - 10.3 mg/dL 9.2  9.4  9.3   Total Protein 6.5 - 8.1 g/dL 7.1  6.9  6.8   Total Bilirubin 0.3 - 1.2 mg/dL 0.5  0.6  0.2   Alkaline Phos 38 - 126 U/L 80  68  76   AST 15 - 41 U/L '21  20  22   '$ ALT 0 - 44 U/L '19  24  25     '$ DIAGNOSTIC IMAGING:  I have independently reviewed the scans and discussed with the patient. No results found.   WRAP UP:  All questions were answered. The patient knows to call the clinic with any problems, questions or concerns.  Medical decision making: Moderate  Time spent on visit: I spent 20 minutes counseling the patient face to face. The total time spent in the appointment was 30 minutes and more than 50% was on counseling.  Harriett Rush, PA-C  09/15/2022 11:24 AM

## 2022-09-15 ENCOUNTER — Inpatient Hospital Stay: Payer: Medicare Other

## 2022-09-15 ENCOUNTER — Encounter: Payer: Self-pay | Admitting: Physician Assistant

## 2022-09-15 ENCOUNTER — Inpatient Hospital Stay: Payer: Medicare Other | Admitting: Physician Assistant

## 2022-09-15 ENCOUNTER — Other Ambulatory Visit: Payer: Self-pay

## 2022-09-15 VITALS — BP 150/79 | HR 72 | Temp 97.8°F | Resp 18 | Wt 149.0 lb

## 2022-09-15 DIAGNOSIS — Z79899 Other long term (current) drug therapy: Secondary | ICD-10-CM | POA: Diagnosis not present

## 2022-09-15 DIAGNOSIS — D7589 Other specified diseases of blood and blood-forming organs: Secondary | ICD-10-CM

## 2022-09-15 DIAGNOSIS — I700 Atherosclerosis of aorta: Secondary | ICD-10-CM | POA: Diagnosis not present

## 2022-09-15 DIAGNOSIS — C773 Secondary and unspecified malignant neoplasm of axilla and upper limb lymph nodes: Secondary | ICD-10-CM | POA: Diagnosis not present

## 2022-09-15 DIAGNOSIS — I872 Venous insufficiency (chronic) (peripheral): Secondary | ICD-10-CM | POA: Diagnosis not present

## 2022-09-15 DIAGNOSIS — E538 Deficiency of other specified B group vitamins: Secondary | ICD-10-CM | POA: Diagnosis not present

## 2022-09-15 DIAGNOSIS — C50919 Malignant neoplasm of unspecified site of unspecified female breast: Secondary | ICD-10-CM

## 2022-09-15 DIAGNOSIS — C50912 Malignant neoplasm of unspecified site of left female breast: Secondary | ICD-10-CM | POA: Diagnosis not present

## 2022-09-15 DIAGNOSIS — Z923 Personal history of irradiation: Secondary | ICD-10-CM | POA: Diagnosis not present

## 2022-09-15 DIAGNOSIS — M810 Age-related osteoporosis without current pathological fracture: Secondary | ICD-10-CM

## 2022-09-15 DIAGNOSIS — D509 Iron deficiency anemia, unspecified: Secondary | ICD-10-CM

## 2022-09-15 DIAGNOSIS — Z170 Estrogen receptor positive status [ER+]: Secondary | ICD-10-CM | POA: Diagnosis not present

## 2022-09-15 DIAGNOSIS — K219 Gastro-esophageal reflux disease without esophagitis: Secondary | ICD-10-CM | POA: Diagnosis not present

## 2022-09-15 DIAGNOSIS — I10 Essential (primary) hypertension: Secondary | ICD-10-CM | POA: Diagnosis not present

## 2022-09-15 DIAGNOSIS — J432 Centrilobular emphysema: Secondary | ICD-10-CM | POA: Diagnosis not present

## 2022-09-15 DIAGNOSIS — Z79811 Long term (current) use of aromatase inhibitors: Secondary | ICD-10-CM | POA: Diagnosis not present

## 2022-09-15 DIAGNOSIS — Z9221 Personal history of antineoplastic chemotherapy: Secondary | ICD-10-CM | POA: Diagnosis not present

## 2022-09-15 MED ORDER — FERROUS SULFATE 325 (65 FE) MG PO TBEC: 325.0000 mg | DELAYED_RELEASE_TABLET | Freq: Every day | ORAL | 3 refills | Status: DC

## 2022-09-15 MED ORDER — CYANOCOBALAMIN 500 MCG PO TABS: 500.0000 ug | ORAL_TABLET | Freq: Every day | ORAL | 3 refills | Status: DC

## 2022-09-15 MED ORDER — CYANOCOBALAMIN 1000 MCG/ML IJ SOLN: 1000.0000 ug | Freq: Once | INTRAMUSCULAR | Status: DC

## 2022-09-15 MED ORDER — CYANOCOBALAMIN 1000 MCG/ML IJ SOLN
1000.0000 ug | Freq: Once | INTRAMUSCULAR | Status: AC
  Administered 2022-09-15: 1000 ug via INTRAMUSCULAR
  Filled 2022-09-15: qty 1

## 2022-09-15 NOTE — Progress Notes (Signed)
Patient tolerated injection with no complaints voiced.  Site clean and dry with no bruising or swelling noted at site.  See MAR for details.  Band aid applied.  Patient stable during and after injection.  Vss with discharge and left in satisfactory condition with no s/s of distress noted.  

## 2022-09-15 NOTE — Patient Instructions (Signed)
[facility]  Discharge Instructions: Thank you for choosing [facility] to provide your oncology and hematology care.  If you have a lab appointment with the Cancer Center, please come in thru the Main Entrance and check in at the main information desk.  Wear comfortable clothing and clothing appropriate for easy access to any Portacath or PICC line.   We strive to give you quality time with your provider. You may need to reschedule your appointment if you arrive late (15 or more minutes).  Arriving late affects you and other patients whose appointments are after yours.  Also, if you miss three or more appointments without notifying the office, you may be dismissed from the clinic at the provider's discretion.      For prescription refill requests, have your pharmacy contact our office and allow 72 hours for refills to be completed.    Vitamin B12 Injection What is this medication? Vitamin B12 (VAHY tuh min B12) prevents and treats low vitamin B12 levels in your body. It is used in people who do not get enough vitamin B12 from their diet or when their digestive tract does not absorb enough. Vitamin B12 plays an important role in maintaining the health of your nervous system and red blood cells. This medicine may be used for other purposes; ask your health care provider or pharmacist if you have questions. COMMON BRAND NAME(S): B-12 Compliance Kit, B-12 Injection Kit, Cyomin, Dodex, LA-12, Nutri-Twelve, Physicians EZ Use B-12, Primabalt What should I tell my care team before I take this medication? They need to know if you have any of these conditions: Kidney disease Leber's disease Megaloblastic anemia An unusual or allergic reaction to cyanocobalamin, cobalt, other medications, foods, dyes, or preservatives Pregnant or trying to get pregnant Breast-feeding How should I use this medication? This medication is injected into a muscle or deeply under the skin.  It is usually given in a clinic or care team's office. However, your care team may teach you how to inject yourself. Follow all instructions. Talk to your care team about the use of this medication in children. Special care may be needed. Overdosage: If you think you have taken too much of this medicine contact a poison control center or emergency room at once. NOTE: This medicine is only for you. Do not share this medicine with others. What if I miss a dose? If you are given your dose at a clinic or care team's office, call to reschedule your appointment. If you give your own injections, and you miss a dose, take it as soon as you can. If it is almost time for your next dose, take only that dose. Do not take double or extra doses. What may interact with this medication? Alcohol Colchicine This list may not describe all possible interactions. Give your health care provider a list of all the medicines, herbs, non-prescription drugs, or dietary supplements you use. Also tell them if you smoke, drink alcohol, or use illegal drugs. Some items may interact with your medicine. What should I watch for while using this medication? Visit your care team regularly. You may need blood work done while you are taking this medication. You may need to follow a special diet. Talk to your care team. Limit your alcohol intake and avoid smoking to get the best benefit. What side effects may I notice from receiving this medication? Side effects that you should report to your care team as soon as possible: Allergic reactions--skin rash,   itching, hives, swelling of the face, lips, tongue, or throat Swelling of the ankles, hands, or feet Trouble breathing Side effects that usually do not require medical attention (report to your care team if they continue or are bothersome): Diarrhea This list may not describe all possible side effects. Call your doctor for medical advice about side effects. You may report side effects  to FDA at 1-800-FDA-1088. Where should I keep my medication? Keep out of the reach of children. Store at room temperature between 15 and 30 degrees C (59 and 85 degrees F). Protect from light. Throw away any unused medication after the expiration date. NOTE: This sheet is a summary. It may not cover all possible information. If you have questions about this medicine, talk to your doctor, pharmacist, or health care provider.  2023 Elsevier/Gold Standard (2007-10-06 00:00:00)    To help prevent nausea and vomiting after your treatment, we encourage you to take your nausea medication as directed.  BELOW ARE SYMPTOMS THAT SHOULD BE REPORTED IMMEDIATELY: *FEVER GREATER THAN 100.4 F (38 C) OR HIGHER *CHILLS OR SWEATING *NAUSEA AND VOMITING THAT IS NOT CONTROLLED WITH YOUR NAUSEA MEDICATION *UNUSUAL SHORTNESS OF BREATH *UNUSUAL BRUISING OR BLEEDING *URINARY PROBLEMS (pain or burning when urinating, or frequent urination) *BOWEL PROBLEMS (unusual diarrhea, constipation, pain near the anus) TENDERNESS IN MOUTH AND THROAT WITH OR WITHOUT PRESENCE OF ULCERS (sore throat, sores in mouth, or a toothache) UNUSUAL RASH, SWELLING OR PAIN  UNUSUAL VAGINAL DISCHARGE OR ITCHING   Items with * indicate a potential emergency and should be followed up as soon as possible or go to the Emergency Department if any problems should occur.  Please show the CHEMOTHERAPY ALERT CARD or IMMUNOTHERAPY ALERT CARD at check-in to the Emergency Department and triage nurse.  Should you have questions after your visit or need to cancel or reschedule your appointment, please contact [facility] [phone]  and follow the prompts.  Office hours are 8:00 a.m. to 4:30 p.m. Monday - Friday. Please note that voicemails left after 4:00 p.m. may not be returned until the following business day.  We are closed weekends and major holidays. You have access to a nurse at all times for urgent questions. Please call  the main number to the clinic [phone] and follow the prompts.  For any non-urgent questions, you may also contact your provider using MyChart. We now offer e-Visits for anyone 18 and older to request care online for non-urgent symptoms. For details visit [email or web address].   Also download the MyChart app! Go to the app store, search "MyChart", open the app, select [facility], and log in with your MyChart username and password.   

## 2022-09-15 NOTE — Patient Instructions (Signed)
[facility] **   You were seen today by Tarri Abernethy PA-C for your follow-up visit.    HISTORY OF BREAST CANCER:  At this time, you do NOT have any signs of recurrent breast cancer.  Your most recent mammogram was normal and your labs do not show any major abnormalities.  Physical exam today was normal.  Continue to take your anastrozole/Arimidex (breast cancer pill) once daily.  OSTEOPOROSIS: Continue taking Fosamax once a week.  Continue your calcium and vitamin D supplements. You will be due for your BONE DENSITY SCAN in about 6 months.  IRON DEFICIENCY: Start taking iron tablet once daily. Take over-the-counter ferrous sulfate 325 mg once daily. Take your iron in the morning if possible (unless is interacts with other medications such as antacids). Take your iron pill with a glass of orange juice to help your body absorb it better. If you take any antacid or acid reflux medicine at home, take your iron at a different time of day, separated by at least 2 hours from your antacid medication. If you experience any constipation from your iron tablet, try taking over the stool softener such as Colace once daily.  Drink plenty of water.  Eat a high fiber diet and consider taking a fiber supplement. If you continue to have severe side effects such as constipation or upset stomach, you can decrease your iron to every other day instead. If you are still having severe side effects at lower dose, please stop taking your iron tablet and call our office to let us know.    VITAMIN B-12 DEFICIENCY You received vitamin B12 injection today.  Start taking vitamin B12 500 mcg daily  FOLLOW-UP APPOINTMENT: Office visit in 6 months, with labs 1 week before  ** Thank you for trusting me with your healthcare!  I strive to provide all of my patients with quality care at each visit.  If you receive a survey for this visit, I would  be so grateful to you for taking the time to provide feedback.  Thank you in advance!  ~ Ermagene Saidi                   Dr. Derek Jack   &   Tarri Abernethy, PA-C   - - - - - - - - - - - - - - - - - -    Thank you for choosing [facility] to provide your oncology and hematology care.  To afford each patient quality time with our provider, please arrive at least 15 minutes before your scheduled appointment time.   If you have a lab appointment with the [facility] please come in thru the Main Entrance and check in at the main information desk.  You need to re-schedule your appointment should you arrive 10 or more minutes late.  We strive to give you quality time with our providers, and arriving late affects you and other patients whose appointments are after yours.  Also, if you no show three or more times for appointments you may be dismissed from the clinic at the providers discretion.     Again, thank you for choosing [facility].  Our hope is that these requests will decrease the amount of time that you wait before being seen by our physicians.       _____________________________________________________________  Should you have questions after your visit to  [facility], please contact our office at [phone] and follow the prompts.  Our office hours are 8:00 a.m. and 4:30 p.m. Monday - Friday.  Please note that voicemails left after 4:00 p.m. may not be returned until the following business day.  We are closed weekends and major holidays.  You do have access to a nurse 24-7, just call the main number to the clinic [phone] and do not press any options, hold on the line and a nurse will answer the phone.    For prescription refill requests, have your pharmacy contact our office and allow 72 hours.

## 2022-09-16 ENCOUNTER — Encounter: Payer: Self-pay | Admitting: Vascular Surgery

## 2022-09-16 ENCOUNTER — Ambulatory Visit: Payer: Medicare Other | Admitting: Vascular Surgery

## 2022-09-16 ENCOUNTER — Ambulatory Visit (HOSPITAL_COMMUNITY)
Admission: RE | Admit: 2022-09-16 | Discharge: 2022-09-16 | Disposition: A | Discharge status: Home / Self Care | Payer: Medicare Other | Source: Ambulatory Visit | Attending: Vascular Surgery | Admitting: Vascular Surgery

## 2022-09-16 VITALS — BP 161/91 | HR 115 | Temp 98.4°F | Resp 20 | Ht 62.0 in | Wt 150.0 lb

## 2022-09-16 DIAGNOSIS — R2241 Localized swelling, mass and lump, right lower limb: Secondary | ICD-10-CM | POA: Diagnosis not present

## 2022-09-16 DIAGNOSIS — M79604 Pain in right leg: Secondary | ICD-10-CM | POA: Diagnosis not present

## 2022-09-16 DIAGNOSIS — M79605 Pain in left leg: Secondary | ICD-10-CM | POA: Diagnosis not present

## 2022-09-30 ENCOUNTER — Ambulatory Visit (INDEPENDENT_AMBULATORY_CARE_PROVIDER_SITE_OTHER): Payer: Medicare Other | Admitting: Vascular Surgery

## 2022-09-30 DIAGNOSIS — R2241 Localized swelling, mass and lump, right lower limb: Secondary | ICD-10-CM

## 2022-09-30 NOTE — Progress Notes (Signed)
  Office Note    I called Caelie regarding right lower swelling.  She was recently seen in the office due to concern for chronic venous insufficiency.  On physical exam, she had signs of venous reflux, however the venous ultrasound study demonstrated only mild, focal reflux within the greater saphenous vein.  Scottie was upset after learning there was no operative intervention that could improve her right lower extremity swelling.  I told Ita I would give her a call after talking to my partners regarding her venous disease, specifically talking to my partner Dr. Gae Gallop who has a specific vein clinic.  After reviewing her chart, Dr. Scot Dock agreed with me that the best course of action is continued lower extremity compression, elevation.  We also discussed the role of hydrotherapy-pool exercise at her local [employer].  Naysha continues to have some waxing waning pain by days end at the level of the ankle due to swelling.  Current compression 15 to 20 mmHg knee-high stockings.  She was originally placed in these due to difficulty with socks on.  I discussed that if the swelling continues, we can increase the level of compression to 20 to 30 mmHg in an effort to combat her edema.  Caily would not benefit from greater saphenous vein ablation, as there is minimal reflux.  She is best treated with conservative measures-compression, elevation, exercise.  I asked that she call my office should symptoms worsen.  My plan would be to have her see my partner Dr. Scot Dock in follow-up.  Broadus John

## 2022-10-11 ENCOUNTER — Other Ambulatory Visit: Payer: Self-pay | Admitting: Physician Assistant

## 2022-10-11 DIAGNOSIS — M810 Age-related osteoporosis without current pathological fracture: Secondary | ICD-10-CM

## 2022-11-05 ENCOUNTER — Other Ambulatory Visit: Payer: Self-pay | Admitting: Hematology

## 2022-11-05 DIAGNOSIS — C50912 Malignant neoplasm of unspecified site of left female breast: Secondary | ICD-10-CM

## 2022-12-23 ENCOUNTER — Encounter: Payer: Self-pay | Admitting: Physician Assistant

## 2022-12-27 ENCOUNTER — Other Ambulatory Visit: Payer: Self-pay | Admitting: *Deleted

## 2022-12-27 DIAGNOSIS — D509 Iron deficiency anemia, unspecified: Secondary | ICD-10-CM

## 2022-12-27 MED ORDER — FERROUS SULFATE 325 (65 FE) MG PO TBEC: 325.0000 mg | DELAYED_RELEASE_TABLET | Freq: Every day | ORAL | 3 refills | Status: DC

## 2023-02-06 ENCOUNTER — Encounter: Payer: Self-pay | Admitting: Physician Assistant

## 2023-03-06 ENCOUNTER — Inpatient Hospital Stay: Payer: Medicare Other | Attending: Physician Assistant

## 2023-03-06 ENCOUNTER — Ambulatory Visit (HOSPITAL_COMMUNITY)
Admission: RE | Admit: 2023-03-06 | Discharge: 2023-03-06 | Disposition: A | Discharge status: Home / Self Care | Payer: Medicare Other | Source: Ambulatory Visit | Attending: Physician Assistant | Admitting: Physician Assistant

## 2023-03-06 DIAGNOSIS — Z923 Personal history of irradiation: Secondary | ICD-10-CM | POA: Diagnosis not present

## 2023-03-06 DIAGNOSIS — D509 Iron deficiency anemia, unspecified: Secondary | ICD-10-CM

## 2023-03-06 DIAGNOSIS — R232 Flushing: Secondary | ICD-10-CM | POA: Insufficient documentation

## 2023-03-06 DIAGNOSIS — E538 Deficiency of other specified B group vitamins: Secondary | ICD-10-CM | POA: Insufficient documentation

## 2023-03-06 DIAGNOSIS — E611 Iron deficiency: Secondary | ICD-10-CM | POA: Insufficient documentation

## 2023-03-06 DIAGNOSIS — M810 Age-related osteoporosis without current pathological fracture: Secondary | ICD-10-CM | POA: Insufficient documentation

## 2023-03-06 DIAGNOSIS — Z87891 Personal history of nicotine dependence: Secondary | ICD-10-CM | POA: Diagnosis not present

## 2023-03-06 DIAGNOSIS — Z79811 Long term (current) use of aromatase inhibitors: Secondary | ICD-10-CM | POA: Diagnosis not present

## 2023-03-06 DIAGNOSIS — M818 Other osteoporosis without current pathological fracture: Secondary | ICD-10-CM | POA: Insufficient documentation

## 2023-03-06 DIAGNOSIS — Z9012 Acquired absence of left breast and nipple: Secondary | ICD-10-CM | POA: Insufficient documentation

## 2023-03-06 DIAGNOSIS — Z170 Estrogen receptor positive status [ER+]: Secondary | ICD-10-CM | POA: Diagnosis not present

## 2023-03-06 DIAGNOSIS — Z780 Asymptomatic menopausal state: Secondary | ICD-10-CM | POA: Diagnosis not present

## 2023-03-06 DIAGNOSIS — C773 Secondary and unspecified malignant neoplasm of axilla and upper limb lymph nodes: Secondary | ICD-10-CM | POA: Insufficient documentation

## 2023-03-06 DIAGNOSIS — C50912 Malignant neoplasm of unspecified site of left female breast: Secondary | ICD-10-CM | POA: Diagnosis not present

## 2023-03-06 DIAGNOSIS — M85851 Other specified disorders of bone density and structure, right thigh: Secondary | ICD-10-CM | POA: Diagnosis not present

## 2023-03-06 DIAGNOSIS — D7589 Other specified diseases of blood and blood-forming organs: Secondary | ICD-10-CM | POA: Insufficient documentation

## 2023-03-06 DIAGNOSIS — C50919 Malignant neoplasm of unspecified site of unspecified female breast: Secondary | ICD-10-CM

## 2023-03-06 LAB — COMPREHENSIVE METABOLIC PANEL
ALT: 22 U/L (ref 0–44)
AST: 19 U/L (ref 15–41)
Albumin: 4.1 g/dL (ref 3.5–5.0)
Alkaline Phosphatase: 74 U/L (ref 38–126)
Anion gap: 7 (ref 5–15)
BUN: 25 mg/dL — ABNORMAL HIGH (ref 8–23)
CO2: 28 mmol/L (ref 22–32)
Calcium: 9 mg/dL (ref 8.9–10.3)
Chloride: 102 mmol/L (ref 98–111)
Creatinine, Ser: 0.66 mg/dL (ref 0.44–1.00)
GFR, Estimated: >60 mL/min (ref >60)
Glucose, Bld: 105 mg/dL — ABNORMAL HIGH (ref 70–99)
Potassium: 3.4 mmol/L — ABNORMAL LOW (ref 3.5–5.1)
Sodium: 137 mmol/L (ref 135–145)
Total Bilirubin: 0.4 mg/dL (ref 0.3–1.2)
Total Protein: 7 g/dL (ref 6.5–8.1)

## 2023-03-06 LAB — CBC WITH DIFFERENTIAL/PLATELET
Abs Immature Granulocytes: 0.01 10*3/uL (ref 0.00–0.07)
Basophils Absolute: 0 10*3/uL (ref 0.0–0.1)
Basophils Relative: 1 %
Eosinophils Absolute: 0.3 10*3/uL (ref 0.0–0.5)
Eosinophils Relative: 5 %
HCT: 38.4 % (ref 36.0–46.0)
Hemoglobin: 12.8 g/dL (ref 12.0–15.0)
Immature Granulocytes: 0 %
Lymphocytes Relative: 25 %
Lymphs Abs: 1.3 10*3/uL (ref 0.7–4.0)
MCH: 34.5 pg — ABNORMAL HIGH (ref 26.0–34.0)
MCHC: 33.3 g/dL (ref 30.0–36.0)
MCV: 103.5 fL — ABNORMAL HIGH (ref 80.0–100.0)
Monocytes Absolute: 0.4 10*3/uL (ref 0.1–1.0)
Monocytes Relative: 8 %
Neutro Abs: 3.1 10*3/uL (ref 1.7–7.7)
Neutrophils Relative %: 61 %
Platelets: 184 10*3/uL (ref 150–400)
RBC: 3.71 MIL/uL — ABNORMAL LOW (ref 3.87–5.11)
RDW: 11.9 % (ref 11.5–15.5)
WBC: 5 10*3/uL (ref 4.0–10.5)
nRBC: 0 % (ref 0.0–0.2)

## 2023-03-06 LAB — IRON AND TIBC
Iron: 117 ug/dL (ref 28–170)
Saturation Ratios: 32 % — ABNORMAL HIGH (ref 10.4–31.8)
TIBC: 367 ug/dL (ref 250–450)
UIBC: 250 ug/dL

## 2023-03-06 LAB — VITAMIN B12: Vitamin B-12: 412 pg/mL (ref 180–914)

## 2023-03-06 LAB — VITAMIN D 25 HYDROXY (VIT D DEFICIENCY, FRACTURES): Vit D, 25-Hydroxy: 61.51 ng/mL (ref 30–100)

## 2023-03-06 LAB — FERRITIN: Ferritin: 35 ng/mL (ref 11–307)

## 2023-03-08 LAB — METHYLMALONIC ACID, SERUM: Methylmalonic Acid, Quantitative: 524 nmol/L — ABNORMAL HIGH (ref 0–378)

## 2023-03-10 NOTE — Progress Notes (Signed)
[facility] [address], [place] [address]   CLINIC:  Medical Oncology/Hematology  PCP:  Ladon Applebaum [address] / [place] [address] [phone]   BRIEF ONCOLOGIC HISTORY:   Oncology History  Invasive ductal carcinoma metastasized to axillary lymph node, left  05/28/2013 Imaging   Screening mammogram   06/19/2013 Imaging   Left diagnostic mammogram   06/25/2013 Imaging   CT CAP- borderline enlarged axillary lymph node on left.  Small pulmonary nodules noted but too small for PET or biopsy; surveillance recommended.    07/03/2013 Surgery   Left partial mastectomy.  Invasive ductal carcinoma. 1.4 cm.  POSITIVE MARGINS.  Additional extensive DCIS noted.  ER 74%, PR 16%, Ki-67 52%, HER2 NEGATIVE.   07/17/2013 Surgery   Left modified radiacl mastectomy with left axillary node dissection.  Negative residual cancer.  Negative inked margins.  1/12 positive lymph nodes.  FINAL MARGINS NEGATIVE.  LYMPH NODE IS HER2 POSITIVE.   08/13/2013 Imaging   MUGA scan shows left ventricular EF of 62%   08/27/2013 - 12/18/2013 Chemotherapy   TCH x 6 cycles   01/07/2014 -  Chemotherapy   Herceptin x 52 weeks   01/27/2014 - 03/13/2014 Radiation Therapy   By Dr. Michell Heinrich   03/29/2014 -  Anti-estrogen oral therapy   Aromasin   10/24/2014 Imaging   Bone density- BMD as determined from Femur Neck Right is 0.698 g/cm2 with a T-Score of -2.4. This patient is considered osteopenic according to World Health Organization [facility]) criteria. (Lumbar spine was not utilized due to advanced degenerative changes).   11/05/2014 Imaging   MUGA- Left ventricular ejection fraction equals 70%. No change from prior.   04/27/2015 Procedure   Port-A-Cath removal by Dr. Lovell Sheehan   06/19/2015 Imaging   CT chest- Status post left mastectomy, without recurrent or metastatic disease. Decrease in size of right-sided pulmonary nodules, favoring an infectious or inflammatory  etiology.   06/21/2017 Imaging   CT Chest w contrast: IMPRESSION: 1. No evidence of metastatic disease in the thorax. Multiple previously noted tiny pulmonary nodules are stable compared to prior studies and considered benign. 2. Aortic atherosclerosis. 3. Mild diffuse bronchial wall thickening with mild centrilobular and paraseptal emphysema most apparent the lung apices; imaging findings that may suggest underlying COPD.     CANCER STAGING:  Cancer Staging  Invasive ductal carcinoma metastasized to axillary lymph node, left Staging form: Breast, AJCC 7th Edition - Clinical: Stage IIA (T1c, N1, cM0) - Signed by Ellouise Newer, PA-C on 09/15/2013   INTERVAL HISTORY:   [name], a 67 y.o. female, returns for routine follow-up of her  history of stage IIa left-sided breast cancer. Brindy was last seen on 03/14/2022 by Rojelio Brenner PA-C.   At today's visit, she reports feeling well.  She denies any recent hospitalizations, surgeries, or changes in her  baseline health status.  She denies any symptoms of recurrence such as new lumps, bone pain, chest pain, dyspnea, or abdominal pain.   She has no new headaches, seizures, or focal neurologic deficits.  No B symptoms such as fever, chills, night sweats, unintentional weight loss.   She continues to take daily Arimidex and is tolerating it fairly well.  She does have mild infrequent hot flashes.  She denies any mood swings, alopecia, or abnormal musculoskeletal pain.  She is taking Fosamax for her osteoporosis in the setting of aromatase inhibitor use.  She denies any recent bone pain, fractures, jaw pain,  or dental issues.  She denies any rectal bleeding, melena, or other signs of blood loss.   She denies any ice pica, but does report some mild fatigue over the past few months.  She is taking vitamin B12 500 mcg daily and ferrous sulfate 325 mg daily.   She reports 70% energy and 100% appetite.  She is maintaining  stable weight at this time.  ASSESSMENT & PLAN:  1.  Left breast cancer (stage IIa) s/p left modified radical mastectomy - Left modified radical mastectomy on 07/17/2013, 1/12 lymph nodes positive, grade 2, 1.4 cm invasive tumor, margins negative, ER 74% positive, PR 16% positive, HER-2 negative, Ki-67 52%, [id number].  HER-2/neu was positive on testing by [facility] on lymph node. - 6 cycles of TCH from 08/27/2013 through 12/18/2013, 1 year Herceptin completed on 08/28/2014. - Status post XRT completed in July 2015. - Anastrozole started in August 2015.  Goal is 10 years of therapy. - She is tolerating anastrozole very well, apart from some infrequent mild hot flashes - Physical examination shows left mastectomy within normal limits.  Right breast has no palpable masses.  No palpable adenopathy. - No red flag symptoms of recurrence per patient - Reviewed labs from 03/06/2023 which showed normal LFTs and baseline CBC.   - Most recent right breast mammogram from 08/12/2022 was BI-RADS Category 1, negative - PLAN: Labs and RTC for physical exam in 6 months (January 2025)  - Next mammogram (unilateral, right) in December 2024 - Continue anastrozole through August 2025   2.  Vitamin B12 deficiency - Patient has chronic mild macrocytosis - Nutritional panel checked on 09/08/2022 showed marginal vitamin B12 at 275, with elevated MMA 591.  Normal folate. - She received vitamin B12 injection on 09/15/2022.  She has been taking vitamin B12 5 mcg daily since January 2024. - Most recent labs (03/06/2023): Vitamin B12 412, MMA remains mildly elevated at 524. - PLAN: Recommend INCREASING vitamin B12 to 1000 mcg daily.  Recheck B12/MMA in 6 months.  3.  Iron deficiency without anemia - Nutritional panel from 09/08/2022 revealed low ferritin at 17, iron saturation 34% - No bright red blood per rectum or melena - She reports mild fatigue.  No pica. - She has been taking ferrous sulfate 325 mg daily since January 2024 -  Most recent labs (03/06/2023): Hgb 12.8/MCV 103.5, ferritin 35, iron saturation 32% - PLAN: Iron levels are slowly improving.  Continue ferrous sulfate 325 mg daily.   We will recheck iron panel in 6 months, and will consider IV iron if symptomatic and persistent ferritin <100.  4.  Osteoporosis - Most recent DEXA scan (03/06/2023): T-score -2.1, improved - DEXA scan (02/17/2021): T score -2.6 - DEXA scan from 01/09/2019 shows T score of -2.6.  This was slightly worse from the prior scans of -2.5, and -2.4 prior to that. - She received temporarily Prolia on 10/09/2014, 04/09/2015 and 09/28/2015.  This was discontinued secondary to high insurance cost. - She is currently taking Fosamax 70 mg weekly.  She takes Vitamin D daily. - No recent fractures, bone pain, jaw pain, or dental issues. - Most recent labs (03/06/2023): Calcium 9.0, vitamin D 61.51 - PLAN: Continue Fosamax 70 mg weekly for the duration of her antiestrogen therapy.  Continue calcium and vitamin D supplements. - Encourage weight bearing exercises -- Next DEXA in July 2026  PLAN SUMMARY:  >> Mammogram (unilateral right breast) around 08/14/2023 >> Labs in 6 months = CBC/D, CMP, B12, MMA, ferritin, iron/TIBC, vitamin D >>  OFFICE visit in 6 months (1 week after labs)    REVIEW OF SYSTEMS:  Review of Systems  Constitutional:  Positive for fatigue (baseline). Negative for appetite change, chills, diaphoresis, fever and unexpected weight change.  HENT:   Negative for lump/mass, nosebleeds and sore throat.   Eyes:  Negative for eye problems.  Respiratory:  Negative for cough, hemoptysis and shortness of breath.   Cardiovascular:  Negative for chest pain, leg swelling (Right leg swelling from venous reflux) and palpitations.  Gastrointestinal:  Negative for abdominal pain, blood in stool, constipation, diarrhea, nausea and vomiting.  Genitourinary:  Negative for hematuria.   Skin: Negative.   Neurological:  Negative for dizziness, headaches  and light-headedness.  Hematological:  Does not bruise/bleed easily.    PHYSICAL EXAM:  Performance status (ECOG): 1 - Symptomatic but completely ambulatory  There were no vitals filed for this visit. Wt Readings from Last 3 Encounters:  09/16/22 150 lb (68 kg)  09/15/22 149 lb 0.5 oz (67.6 kg)  06/03/22 149 lb (67.6 kg)   Physical Exam Constitutional:      Appearance: Normal appearance. She is obese.  HENT:     Head: Normocephalic and atraumatic.     Mouth/Throat:     Mouth: Mucous membranes are moist.  Eyes:     Extraocular Movements: Extraocular movements intact.     Pupils: Pupils are equal, round, and reactive to light.  Cardiovascular:     Rate and Rhythm: Normal rate and regular rhythm.     Pulses: Normal pulses.     Heart sounds: Normal heart sounds.  Pulmonary:     Effort: Pulmonary effort is normal.     Breath sounds: Normal breath sounds.  Chest:  Breasts:    Right: Normal.     Left: Absent.       Comments: Left mastectomy site within normal limits. No discrete nodules or masses palpated within the right breast. Abdominal:     General: Bowel sounds are normal.     Palpations: Abdomen is soft.     Tenderness: There is no abdominal tenderness.  Musculoskeletal:        General: No swelling.     Right lower leg: No edema.     Left lower leg: No edema.  Lymphadenopathy:     Cervical: No cervical adenopathy.     Upper Body:     Right upper body: No supraclavicular, axillary or pectoral adenopathy.     Left upper body: No supraclavicular, axillary or pectoral adenopathy.  Skin:    General: Skin is warm and dry.  Neurological:     General: No focal deficit present.     Mental Status: She is alert and oriented to person, place, and time.  Psychiatric:        Mood and Affect: Mood normal.        Behavior: Behavior normal.      PAST MEDICAL/SURGICAL HISTORY:  Past Medical History:  Diagnosis Date   Anxiety    B12 deficiency 01/27/2016   Barrett's  esophagus    Breast cancer (HCC)    Breast cancer, left breast (HCC)    Bronchitis 07/27/2011   GERD (gastroesophageal reflux disease)    Hypertension    Osteopenia 10/09/2014   Past Surgical History:  Procedure Laterality Date   BREAST BIOPSY Bilateral    X 4- benign   ESOPHAGOGASTRODUODENOSCOPY  08/03/2011   Procedure: ESOPHAGOGASTRODUODENOSCOPY (EGD);  Surgeon: Malissa Hippo, MD;  Location: [facility];  Service: Endoscopy;  Laterality: N/A;  12:45   EXCISION MORTON'S NEUROMA  07/14/2011   Procedure: EXCISION MORTON'S NEUROMA;  Surgeon: Dallas Schimke;  Location: [facility];  Service: Orthopedics;  Laterality: Right;  Removal Neuroma 3rd Interspace Right Foot   MASTECTOMY Left    MASTECTOMY MODIFIED RADICAL Left 07/17/2013   Procedure: MASTECTOMY MODIFIED RADICAL WITH LEFT AXILLARY TISSUE REMOVAL;  Surgeon: Marlane Hatcher, MD;  Location: [facility];  Service: General;  Laterality: Left;   PARTIAL MASTECTOMY WITH NEEDLE LOCALIZATION Left 07/03/2013   Procedure: PARTIAL MASTECTOMY STATUS POST NEEDLE LOCALIZATION;  Surgeon: Marlane Hatcher, MD;  Location: [facility];  Service: General;  Laterality: Left;   PORT-A-CATH REMOVAL Right 04/27/2015   Procedure: REMOVAL PORT-A-CATH;  Surgeon: Franky Macho, MD;  Location: [facility];  Service: General;  Laterality: Right;   PORTACATH PLACEMENT Right 08/16/2013   Procedure: INSERTION PORT-A-CATH RIGHT SUBCLAVIAN;  Surgeon: Marlane Hatcher, MD;  Location: [facility];  Service: General;  Laterality: Right;    SOCIAL HISTORY:  Social History   Socioeconomic History   Marital status: Married    Spouse name: Not on file   Number of children: Not on file   Years of education: Not on file   Highest education level: Not on file  Occupational History   Not on file  Tobacco Use   Smoking status: Former    Current packs/day: 0.00    Average packs/day: 1 pack/day for 20.0 years (20.0 ttl pk-yrs)    Types: Cigarettes    Start date: 09/05/1973     Quit date: 09/05/1993    Years since quitting: 29.5   Smokeless tobacco: Never  Substance and Sexual Activity   Alcohol use: No    Comment: occasionally   Drug use: No   Sexual activity: Not on file  Other Topics Concern   Not on file  Social History Narrative   Not on file   Social Determinants of Health   Financial Resource Strain: Not on file  Food Insecurity: Not on file  Transportation Needs: Not on file  Physical Activity: Not on file  Stress: Not on file  Social Connections: Not on file  Intimate Partner Violence: Not on file    FAMILY HISTORY:  Family History  Problem Relation Age of Onset   Anesthesia problems Neg Hx    Hypotension Neg Hx    Malignant hyperthermia Neg Hx    Pseudochol deficiency Neg Hx     CURRENT MEDICATIONS:  Current Outpatient Medications  Medication Sig Dispense Refill   alendronate (FOSAMAX) 70 MG tablet TAKE 1 TABLET(70 MG) BY MOUTH 1 TIME A WEEK WITH A FULL GLASS OF WATER AND ON AN EMPTY STOMACH 4 tablet 6   anastrozole (ARIMIDEX) 1 MG tablet TAKE 1 TABLET(1 MG) BY MOUTH DAILY 30 tablet 6   Calcium Carbonate-Vitamin D (CALCIUM-VITAMIN D3 PO) Take by mouth.     cyanocobalamin (VITAMIN B12) 500 MCG tablet Take 1 tablet (500 mcg total) by mouth daily. 90 tablet 3   diclofenac (VOLTAREN) 25 MG EC tablet Take 1 tablet (25 mg total) by mouth 2 (two) times daily as needed for moderate pain. 20 tablet 0   DULoxetine (CYMBALTA) 30 MG capsule Take 30 mg by mouth daily.     ferrous sulfate 325 (65 FE) MG EC tablet Take 1 tablet (325 mg total) by mouth daily with breakfast. 90 tablet 3   losartan-hydrochlorothiazide (HYZAAR) 100-25 MG tablet Take 1 tablet by mouth daily.     omeprazole (PRILOSEC) 20 MG  capsule Take 20 mg by mouth 2 (two) times daily.      traZODone (DESYREL) 50 MG tablet Take 25 mg by mouth at bedtime as needed for sleep. For sleep     No current facility-administered medications for this visit.    ALLERGIES:  No Known  Allergies  LABORATORY DATA:  I have reviewed the labs as listed.     Latest Ref Rng & Units 03/06/2023   10:26 AM 09/08/2022   10:56 AM 03/07/2022   10:59 AM  CBC  WBC 4.0 - 10.5 K/uL 5.0  6.6  4.8   Hemoglobin 12.0 - 15.0 g/dL 16.1  09.6  04.5   Hematocrit 36.0 - 46.0 % 38.4  38.4  39.0   Platelets 150 - 400 K/uL 184  210  181       Latest Ref Rng & Units 03/06/2023   10:26 AM 09/08/2022   10:56 AM 03/07/2022   10:59 AM  CMP  Glucose 70 - 99 mg/dL 409  811  914   BUN 8 - 23 mg/dL 25  20  15    Creatinine 0.44 - 1.00 mg/dL 7.82  9.56  2.13   Sodium 135 - 145 mmol/L 137  139  140   Potassium 3.5 - 5.1 mmol/L 3.4  3.4  3.4   Chloride 98 - 111 mmol/L 102  104  105   CO2 22 - 32 mmol/L 28  27  30    Calcium 8.9 - 10.3 mg/dL 9.0  9.2  9.4   Total Protein 6.5 - 8.1 g/dL 7.0  7.1  6.9   Total Bilirubin 0.3 - 1.2 mg/dL 0.4  0.5  0.6   Alkaline Phos 38 - 126 U/L 74  80  68   AST 15 - 41 U/L 19  21  20    ALT 0 - 44 U/L 22  19  24      DIAGNOSTIC IMAGING:  I have independently reviewed the scans and discussed with the patient. DG Bone Density  Result Date: 03/06/2023 EXAM: DUAL X-RAY ABSORPTIOMETRY (DXA) FOR BONE MINERAL DENSITY IMPRESSION: Your patient Heather Vaughan completed a FRAX assessment on 03/06/2023 using the [employer] DXA System (analysis version: 14.10) manufactured by [employer]. The following summarizes the results of our evaluation. PATIENT BIOGRAPHICAL: Name: Dyesha, Henault Patient ID: [id number] Birth Date: 1956-02-11 Height:    62.0 in. Gender:     [age]    [age]       Weight:    150.0 lbs. Ethnicity:  White                            Exam Date: 03/06/2023 FRAX* RESULTS:  (version: 3.5) 10-year Probability of Fracture1 Major Osteoporotic Fracture2 Hip Fracture 12.3% 2.4% Population: [place] (Caucasian) Risk Factors: None Based on DualFemur (Right) Neck BMD 1 -The 10-year probability of fracture may be lower than reported if the patient has received treatment. 2  -Major Osteoporotic Fracture: Clinical Spine, Forearm, Hip or Shoulder *FRAX is a Armed forces logistics/support/administrative officer of the [employer] of [employer] for Metabolic Bone Disease, a World Science writer (WHO) [employer]. ASSESSMENT: The probability of a major osteoporotic fracture is 12.3% within the next ten years. The probability of a hip fracture is 2.4% within the next ten years. Your patient Krina Mraz completed a BMD test on 03/06/2023 using the [employer] DXA System (software version: 14.10) manufactured by [employer] LUNAR. The following summarizes the results of our evaluation. Technologist: BRG PATIENT BIOGRAPHICAL: Name: Leandria, Thier Patient ID: [id number] Birth Date: [birth year] Height: 62.0 in. Gender: Female Exam Date: 03/06/2023 Weight: 150.0 lbs. Indications: Height Loss, Post Menopausal, Hx Breast Ca, Low Calcium Intake, Follow up Osteoporosis, Caucasian Fractures: Toe Treatments: Fosamax, Vitamin D, Arimidex, Aromasin, Calcium DENSITOMETRY RESULTS: Site         Region     Measured Date Measured Age WHO Classification Young Adult T-score BMD         %Change vs. Previous Significant Change (*) DualFemur Neck Right 03/06/2023 68.5 Osteopenia -2.1 0.741 g/cm2 9.8% Yes DualFemur Neck Right 02/17/2021 66.4 Osteoporosis -2.6 0.675 g/cm2 -0.7% - DualFemur Neck Right 01/09/2019 64.3 Osteoporosis -2.6 0.680 g/cm2 -2.3% - DualFemur Neck Right 12/21/2016 62.3 Osteoporosis -2.5 0.696 g/cm2 -0.3% - DualFemur Neck Right 09/29/2014 60.0 Osteopenia -2.4 0.698 g/cm2 - - DualFemur Total Mean 03/06/2023 68.5 Osteopenia -1.6 0.804 g/cm2 1.3% - DualFemur Total Mean 02/17/2021 66.4 Osteopenia -1.7 0.794 g/cm2 2.3% - DualFemur Total Mean 01/09/2019 64.3 - - 0.776 g/cm2 -4.1% Yes DualFemur Total Mean 12/21/2016 62.3 - - 0.809 g/cm2 -0.6% - DualFemur Total Mean 09/29/2014 60.0 Osteopenia -1.5 0.814 g/cm2 - - Left Forearm Radius 33% 03/06/2023 68.5 Osteopenia -1.6 0.593 g/cm2 -6.8% - Left Forearm  Radius 33% 02/17/2021 66.4 Normal -1.0 0.636 g/cm2 6.0% - Left Forearm Radius 33% 01/09/2019 64.3 Osteopenia -1.5 0.599 g/cm2 -7.8% Yes Left Forearm Radius 33% 12/21/2016 62.3 Normal -0.8 0.650 g/cm2 -2.8% - Left Forearm Radius 33% 09/29/2014 60.0 Normal -0.5 0.669 g/cm2 - - ASSESSMENT: The BMD measured at Femur Neck Right is 0.741 g/cm2 with a T-score of -2.1. This patient is considered osteopenic according to World Health Organization [facility]) criteria. Compared with the prior study on 02/17/21 , the BMD of the total hip mean shows no statistically significant change. Lumbar spine was excluded due to advanced degenerative changes. The scan quality is good. World Science writer [facility]) criteria for post-menopausal, Caucasian Women: Normal:       T-score at or above -1 SD Osteopenia:   T-score between -1 and -2.5 SD Osteoporosis: T-score at or below -2.5 SD RECOMMENDATIONS: 1. All patients should optimize calcium and vitamin D intake. 2. Consider FDA-approved medical therapies in postmenopausal women and med aged 74 years and older, based on the following: a. A hip or vertebral (clinical or morphometric) fracture b. T-score< -2.5 at the femoral neck or spine after appropriate evaluation to exclude secondary causes c. Low bone mass (T-score between -1.0 and -2.5 at the femoral neck or spine) and a 10-year probability of a hip fracture > 3% or a 10-year probability of a major osteoporosis-related fracture > 20% based on the [place]-adapted WHO algorithm d. Clinician judgment and/or patient preferences may indicate treatment for people with 10-year fracture probabilities above or below these levels FOLLOW-UP: Patients with diagnosis of osteoporsis or at high risk for fracture should have regular bone mineral density tests. For patients eligible for Medicare, routine testing is allowed once every 2 years. The testing frequency can be increased to one year for patients who have rapidly progressing disease, those who are  receiving or discontinuing medical therapy to restore bone mass, or have additional risk factors. I have reviewed this report, and agree with the above findings. [facility] Electronically Signed   By: Baird Lyons M.D.   On: 03/06/2023 10:38     WRAP UP:  All questions were answered. The patient knows to call the clinic  with any problems, questions or concerns.  Medical decision making: Moderate  Time spent on visit: I spent 20 minutes counseling the patient face to face. The total time spent in the appointment was 30 minutes and more than 50% was on counseling.  Carnella Guadalajara, PA-C  03/13/23 11:52 AM

## 2023-03-13 ENCOUNTER — Inpatient Hospital Stay: Payer: Medicare Other | Admitting: Physician Assistant

## 2023-03-13 VITALS — BP 147/80 | HR 72 | Temp 98.7°F | Resp 17 | Ht 62.0 in | Wt 146.0 lb

## 2023-03-13 DIAGNOSIS — Z170 Estrogen receptor positive status [ER+]: Secondary | ICD-10-CM | POA: Diagnosis not present

## 2023-03-13 DIAGNOSIS — C50919 Malignant neoplasm of unspecified site of unspecified female breast: Secondary | ICD-10-CM

## 2023-03-13 DIAGNOSIS — D7589 Other specified diseases of blood and blood-forming organs: Secondary | ICD-10-CM | POA: Diagnosis not present

## 2023-03-13 DIAGNOSIS — C773 Secondary and unspecified malignant neoplasm of axilla and upper limb lymph nodes: Secondary | ICD-10-CM | POA: Diagnosis not present

## 2023-03-13 DIAGNOSIS — C50912 Malignant neoplasm of unspecified site of left female breast: Secondary | ICD-10-CM

## 2023-03-13 DIAGNOSIS — E538 Deficiency of other specified B group vitamins: Secondary | ICD-10-CM

## 2023-03-13 DIAGNOSIS — D509 Iron deficiency anemia, unspecified: Secondary | ICD-10-CM

## 2023-03-13 DIAGNOSIS — M818 Other osteoporosis without current pathological fracture: Secondary | ICD-10-CM | POA: Diagnosis not present

## 2023-03-13 DIAGNOSIS — M810 Age-related osteoporosis without current pathological fracture: Secondary | ICD-10-CM

## 2023-03-13 DIAGNOSIS — E611 Iron deficiency: Secondary | ICD-10-CM | POA: Diagnosis not present

## 2023-03-13 DIAGNOSIS — R232 Flushing: Secondary | ICD-10-CM | POA: Diagnosis not present

## 2023-03-13 DIAGNOSIS — Z87891 Personal history of nicotine dependence: Secondary | ICD-10-CM | POA: Diagnosis not present

## 2023-03-13 DIAGNOSIS — Z79811 Long term (current) use of aromatase inhibitors: Secondary | ICD-10-CM | POA: Diagnosis not present

## 2023-03-13 DIAGNOSIS — Z923 Personal history of irradiation: Secondary | ICD-10-CM | POA: Diagnosis not present

## 2023-03-13 DIAGNOSIS — Z9012 Acquired absence of left breast and nipple: Secondary | ICD-10-CM | POA: Diagnosis not present

## 2023-03-13 MED ORDER — CYANOCOBALAMIN 500 MCG PO TABS: 1000.0000 ug | ORAL_TABLET | Freq: Every day | ORAL | 3 refills | Status: DC

## 2023-03-13 NOTE — Patient Instructions (Signed)
[facility] at [facility] **VISIT SUMMARY & IMPORTANT INSTRUCTIONS **   You were seen today by Rojelio Brenner PA-C for your follow-up visit.    HISTORY OF BREAST CANCER:  At this time, you do NOT have any signs of recurrent breast cancer.  Your most recent mammogram was normal and your labs do not show any major abnormalities.  Physical exam today was normal.  Continue to take your anastrozole/Arimidex (breast cancer pill) once daily.  OSTEOPOROSIS: Your bone density scan showed improvement in your bone strength!  Continue taking Fosamax once a week.  Continue your calcium and vitamin D supplements.  IRON DEFICIENCY: Continue taking iron tablet once daily.  VITAMIN B-12 DEFICIENCY INCREASE your vitamin B12 supplement from 500 mcg daily to 1000 mcg daily  FOLLOW-UP APPOINTMENT: - Mammogram in December 2024 - Office visit in 6 months (January 2025), with labs 1 week before  ** Thank you for trusting me with your healthcare!  I strive to provide all of my patients with quality care at each visit.  If you receive a survey for this visit, I would be so grateful to you for taking the time to provide feedback.  Thank you in advance!  ~ Timiya Howells                   Dr. Doreatha Massed   &   Rojelio Brenner, PA-C   - - - - - - - - - - - - - - - - - -    Thank you for choosing [facility] at [facility] to provide your oncology and hematology care.  To afford each patient quality time with our provider, please arrive at least 15 minutes before your scheduled appointment time.   If you have a lab appointment with the Cancer Center please come in thru the Main Entrance and check in at the main information desk.  You need to re-schedule your appointment should you arrive 10 or more minutes late.  We strive to give you quality time with our providers, and arriving late affects you and other patients whose appointments are after yours.  Also, if you  no show three or more times for appointments you may be dismissed from the clinic at the providers discretion.     Again, thank you for choosing [facility].  Our hope is that these requests will decrease the amount of time that you wait before being seen by our physicians.       _____________________________________________________________  Should you have questions after your visit to [facility], please contact our office at [phone] and follow the prompts.  Our office hours are 8:00 a.m. and 4:30 p.m. Monday - Friday.  Please note that voicemails left after 4:00 p.m. may not be returned until the following business day.  We are closed weekends and major holidays.  You do have access to a nurse 24-7, just call the main number to the clinic [phone] and do not press any options, hold on the line and a nurse will answer the phone.    For prescription refill requests, have your pharmacy contact our office and allow 72 hours.

## 2023-03-22 ENCOUNTER — Other Ambulatory Visit: Payer: Self-pay | Admitting: Hematology

## 2023-03-22 DIAGNOSIS — M810 Age-related osteoporosis without current pathological fracture: Secondary | ICD-10-CM

## 2023-06-20 ENCOUNTER — Other Ambulatory Visit: Payer: Self-pay | Admitting: Physician Assistant

## 2023-06-20 DIAGNOSIS — C50912 Malignant neoplasm of unspecified site of left female breast: Secondary | ICD-10-CM

## 2023-07-06 DIAGNOSIS — K219 Gastro-esophageal reflux disease without esophagitis: Secondary | ICD-10-CM | POA: Diagnosis not present

## 2023-07-06 DIAGNOSIS — G620 Drug-induced polyneuropathy: Secondary | ICD-10-CM | POA: Diagnosis not present

## 2023-07-06 DIAGNOSIS — I10 Essential (primary) hypertension: Secondary | ICD-10-CM | POA: Diagnosis not present

## 2023-07-24 DIAGNOSIS — R600 Localized edema: Secondary | ICD-10-CM | POA: Diagnosis not present

## 2023-07-24 DIAGNOSIS — R5383 Other fatigue: Secondary | ICD-10-CM | POA: Diagnosis not present

## 2023-08-08 DIAGNOSIS — M25579 Pain in unspecified ankle and joints of unspecified foot: Secondary | ICD-10-CM | POA: Diagnosis not present

## 2023-08-08 DIAGNOSIS — G5750 Tarsal tunnel syndrome, unspecified lower limb: Secondary | ICD-10-CM | POA: Diagnosis not present

## 2023-08-08 DIAGNOSIS — M79671 Pain in right foot: Secondary | ICD-10-CM | POA: Diagnosis not present

## 2023-08-16 ENCOUNTER — Encounter (HOSPITAL_COMMUNITY): Payer: Self-pay

## 2023-08-16 ENCOUNTER — Ambulatory Visit (HOSPITAL_COMMUNITY)
Admission: RE | Admit: 2023-08-16 | Discharge: 2023-08-16 | Disposition: A | Discharge status: Home / Self Care | Payer: Medicare Other | Source: Ambulatory Visit | Attending: Physician Assistant | Admitting: Physician Assistant

## 2023-08-16 DIAGNOSIS — Z853 Personal history of malignant neoplasm of breast: Secondary | ICD-10-CM | POA: Insufficient documentation

## 2023-08-16 DIAGNOSIS — Z1231 Encounter for screening mammogram for malignant neoplasm of breast: Secondary | ICD-10-CM | POA: Diagnosis not present

## 2023-08-16 DIAGNOSIS — R92321 Mammographic fibroglandular density, right breast: Secondary | ICD-10-CM | POA: Diagnosis not present

## 2023-08-16 DIAGNOSIS — C50912 Malignant neoplasm of unspecified site of left female breast: Secondary | ICD-10-CM

## 2023-09-13 ENCOUNTER — Other Ambulatory Visit: Payer: Medicare Other

## 2023-09-13 ENCOUNTER — Inpatient Hospital Stay: Payer: Medicare Other | Attending: Hematology

## 2023-09-13 DIAGNOSIS — Z923 Personal history of irradiation: Secondary | ICD-10-CM | POA: Insufficient documentation

## 2023-09-13 DIAGNOSIS — Z170 Estrogen receptor positive status [ER+]: Secondary | ICD-10-CM | POA: Insufficient documentation

## 2023-09-13 DIAGNOSIS — Z1721 Progesterone receptor positive status: Secondary | ICD-10-CM | POA: Diagnosis not present

## 2023-09-13 DIAGNOSIS — Z1732 Human epidermal growth factor receptor 2 negative status: Secondary | ICD-10-CM | POA: Insufficient documentation

## 2023-09-13 DIAGNOSIS — C50912 Malignant neoplasm of unspecified site of left female breast: Secondary | ICD-10-CM | POA: Diagnosis not present

## 2023-09-13 DIAGNOSIS — Z87891 Personal history of nicotine dependence: Secondary | ICD-10-CM | POA: Diagnosis not present

## 2023-09-13 DIAGNOSIS — E611 Iron deficiency: Secondary | ICD-10-CM | POA: Insufficient documentation

## 2023-09-13 DIAGNOSIS — Z9012 Acquired absence of left breast and nipple: Secondary | ICD-10-CM | POA: Insufficient documentation

## 2023-09-13 DIAGNOSIS — E538 Deficiency of other specified B group vitamins: Secondary | ICD-10-CM | POA: Insufficient documentation

## 2023-09-13 DIAGNOSIS — D7589 Other specified diseases of blood and blood-forming organs: Secondary | ICD-10-CM

## 2023-09-13 DIAGNOSIS — C50919 Malignant neoplasm of unspecified site of unspecified female breast: Secondary | ICD-10-CM

## 2023-09-13 DIAGNOSIS — Z79811 Long term (current) use of aromatase inhibitors: Secondary | ICD-10-CM | POA: Diagnosis not present

## 2023-09-13 DIAGNOSIS — M810 Age-related osteoporosis without current pathological fracture: Secondary | ICD-10-CM | POA: Diagnosis not present

## 2023-09-13 DIAGNOSIS — D509 Iron deficiency anemia, unspecified: Secondary | ICD-10-CM

## 2023-09-13 LAB — CBC WITH DIFFERENTIAL/PLATELET
Abs Immature Granulocytes: 0.02 10*3/uL (ref 0.00–0.07)
Basophils Absolute: 0.1 10*3/uL (ref 0.0–0.1)
Basophils Relative: 1 %
Eosinophils Absolute: 0.2 10*3/uL (ref 0.0–0.5)
Eosinophils Relative: 3 %
HCT: 40.8 % (ref 36.0–46.0)
Hemoglobin: 13.1 g/dL (ref 12.0–15.0)
Immature Granulocytes: 0 %
Lymphocytes Relative: 31 %
Lymphs Abs: 1.8 10*3/uL (ref 0.7–4.0)
MCH: 33.3 pg (ref 26.0–34.0)
MCHC: 32.1 g/dL (ref 30.0–36.0)
MCV: 103.8 fL — ABNORMAL HIGH (ref 80.0–100.0)
Monocytes Absolute: 0.4 10*3/uL (ref 0.1–1.0)
Monocytes Relative: 7 %
Neutro Abs: 3.5 10*3/uL (ref 1.7–7.7)
Neutrophils Relative %: 58 %
Platelets: 196 10*3/uL (ref 150–400)
RBC: 3.93 MIL/uL (ref 3.87–5.11)
RDW: 11.8 % (ref 11.5–15.5)
WBC: 6 10*3/uL (ref 4.0–10.5)
nRBC: 0 % (ref 0.0–0.2)

## 2023-09-13 LAB — COMPREHENSIVE METABOLIC PANEL
ALT: 18 U/L (ref 0–44)
AST: 18 U/L (ref 15–41)
Albumin: 4.1 g/dL (ref 3.5–5.0)
Alkaline Phosphatase: 70 U/L (ref 38–126)
Anion gap: 8 (ref 5–15)
BUN: 18 mg/dL (ref 8–23)
CO2: 29 mmol/L (ref 22–32)
Calcium: 9.1 mg/dL (ref 8.9–10.3)
Chloride: 102 mmol/L (ref 98–111)
Creatinine, Ser: 0.76 mg/dL (ref 0.44–1.00)
GFR, Estimated: >60 mL/min (ref >60)
Glucose, Bld: 109 mg/dL — ABNORMAL HIGH (ref 70–99)
Potassium: 3.5 mmol/L (ref 3.5–5.1)
Sodium: 139 mmol/L (ref 135–145)
Total Bilirubin: 0.5 mg/dL (ref 0.0–1.2)
Total Protein: 7.1 g/dL (ref 6.5–8.1)

## 2023-09-13 LAB — FERRITIN: Ferritin: 14 ng/mL (ref 11–307)

## 2023-09-13 LAB — IRON AND TIBC
Iron: 126 ug/dL (ref 28–170)
Saturation Ratios: 30 % (ref 10.4–31.8)
TIBC: 424 ug/dL (ref 250–450)
UIBC: 298 ug/dL

## 2023-09-13 LAB — VITAMIN B12: Vitamin B-12: 289 pg/mL (ref 180–914)

## 2023-09-13 LAB — VITAMIN D 25 HYDROXY (VIT D DEFICIENCY, FRACTURES): Vit D, 25-Hydroxy: 44.02 ng/mL (ref 30–100)

## 2023-09-14 DIAGNOSIS — I10 Essential (primary) hypertension: Secondary | ICD-10-CM | POA: Diagnosis not present

## 2023-09-14 DIAGNOSIS — Z0000 Encounter for general adult medical examination without abnormal findings: Secondary | ICD-10-CM | POA: Diagnosis not present

## 2023-09-14 DIAGNOSIS — G620 Drug-induced polyneuropathy: Secondary | ICD-10-CM | POA: Diagnosis not present

## 2023-09-14 DIAGNOSIS — K219 Gastro-esophageal reflux disease without esophagitis: Secondary | ICD-10-CM | POA: Diagnosis not present

## 2023-09-15 LAB — METHYLMALONIC ACID, SERUM: Methylmalonic Acid, Quantitative: 614 nmol/L — ABNORMAL HIGH (ref 0–378)

## 2023-09-19 NOTE — Progress Notes (Unsigned)
[facility] [address], [place] [address]   CLINIC:  Medical Oncology/Hematology  PCP:  Ladon Applebaum [address] / [place] [address] [phone]   BRIEF ONCOLOGIC HISTORY:   Oncology History  Invasive ductal carcinoma metastasized to axillary lymph node, left  05/28/2013 Imaging   Screening mammogram   06/19/2013 Imaging   Left diagnostic mammogram   06/25/2013 Imaging   CT CAP- borderline enlarged axillary lymph node on left.  Small pulmonary nodules noted but too small for PET or biopsy; surveillance recommended.    07/03/2013 Surgery   Left partial mastectomy.  Invasive ductal carcinoma. 1.4 cm.  POSITIVE MARGINS.  Additional extensive DCIS noted.  ER 74%, PR 16%, Ki-67 52%, HER2 NEGATIVE.   07/17/2013 Surgery   Left modified radiacl mastectomy with left axillary node dissection.  Negative residual cancer.  Negative inked margins.  1/12 positive lymph nodes.  FINAL MARGINS NEGATIVE.  LYMPH NODE IS HER2 POSITIVE.   08/13/2013 Imaging   MUGA scan shows left ventricular EF of 62%   08/27/2013 - 12/18/2013 Chemotherapy   TCH x 6 cycles   01/07/2014 -  Chemotherapy   Herceptin x 52 weeks   01/27/2014 - 03/13/2014 Radiation Therapy   By Dr. Michell Heinrich   03/29/2014 -  Anti-estrogen oral therapy   Aromasin   10/24/2014 Imaging   Bone density- BMD as determined from Femur Neck Right is 0.698 g/cm2 with a T-Score of -2.4. This patient is considered osteopenic according to World Health Organization [facility]) criteria. (Lumbar spine was not utilized due to advanced degenerative changes).   11/05/2014 Imaging   MUGA- Left ventricular ejection fraction equals 70%. No change from prior.   04/27/2015 Procedure   Port-A-Cath removal by Dr. Lovell Sheehan   06/19/2015 Imaging   CT chest- Status post left mastectomy, without recurrent or metastatic disease. Decrease in size of right-sided pulmonary nodules, favoring an infectious or inflammatory  etiology.   06/21/2017 Imaging   CT Chest w contrast: IMPRESSION: 1. No evidence of metastatic disease in the thorax. Multiple previously noted tiny pulmonary nodules are stable compared to prior studies and considered benign. 2. Aortic atherosclerosis. 3. Mild diffuse bronchial wall thickening with mild centrilobular and paraseptal emphysema most apparent the lung apices; imaging findings that may suggest underlying COPD.     CANCER STAGING:  Cancer Staging  Invasive ductal carcinoma metastasized to axillary lymph node, left Staging form: Breast, AJCC 7th Edition - Clinical: Stage IIA (T1c, N1, cM0) - Signed by Ellouise Newer, PA-C on 09/15/2013   INTERVAL HISTORY:   [name], a 68 y.o. female, returns for routine follow-up of her  history of stage IIa left-sided breast cancer. [name] was last seen on 03/13/2023 by Rojelio Brenner PA-C.   At today's visit, she reports feeling well.  She denies any recent hospitalizations, surgeries, or changes in her  baseline health status.  She denies any symptoms of recurrence such as new lumps, bone pain, chest pain, dyspnea, or abdominal pain.   She has no new headaches, seizures, or focal neurologic deficits.  No B symptoms such as fever, chills, night sweats, unintentional weight loss.   She continues to take daily Arimidex and is tolerating it fairly well.  She does have mild infrequent hot flashes.  She denies any mood swings, alopecia, or abnormal musculoskeletal pain.  She is taking Fosamax for her osteoporosis in the setting of aromatase inhibitor use.  She denies any recent bone pain, fractures, jaw pain,  or dental issues.  She denies any rectal bleeding, melena, or other signs of blood loss.   She denies any fatigue or ice pica. She has not been taking her iron of B12 for the past several months.      She reports 80% energy and 100% appetite.  She is maintaining stable weight at this time.  ASSESSMENT & PLAN:  1.   Left breast cancer (stage IIa) s/p left modified radical mastectomy - Left modified radical mastectomy on 07/17/2013, 1/12 lymph nodes positive, grade 2, 1.4 cm invasive tumor, margins negative, ER 74% positive, PR 16% positive, HER-2 negative, Ki-67 52%, PT1CPN1A.  HER-2/neu was positive on testing by [facility] on lymph node. - 6 cycles of TCH from 08/27/2013 through 12/18/2013, 1 year Herceptin completed on 08/28/2014. - Status post XRT completed in July 2015. - Anastrozole started in August 2015.  Goal is 10 years of therapy. - She is tolerating anastrozole very well, apart from some infrequent mild hot flashes - Physical examination (09/20/2023) shows left mastectomy within normal limits.  Right breast has no palpable masses.  No palpable adenopathy. - No red flag symptoms of recurrence per patient - Reviewed labs from 09/12/2022 which showed normal LFTs and baseline CBC.   - Most recent right breast mammogram (08/16/2023): BI-RADS Category 1, negative  - PLAN: Labs and RTC for physical exam in 6 months (July 2025)  - Next mammogram (unilateral, right) in December 2025 - Continue anastrozole through August 2025   2.  Vitamin B12 deficiency - Patient has chronic mild macrocytosis - Nutritional panel checked on 09/08/2022 showed marginal vitamin B12 at 275, with elevated MMA 591.  Normal folate. - She received vitamin B12 injection on 09/15/2022.  She was previously taking vitamin B12 1000 mcg daily, but stopped a few months ago - Most recent labs (09/13/2023): Vitamin B12 289, MMA remains elevated at 614. - PLAN: Instructed to RESTART vitamin B12 to 1000 mcg daily.  Recheck B12/MMA in 6 months.  3.  Iron deficiency without anemia - Nutritional panel from 09/08/2022 revealed low ferritin at 17, iron saturation 34% - No bright red blood per rectum or melena - No pica or fatigue. - She has been taking ferrous sulfate 325 mg daily since January 2024, but stopped a few months ago - Most recent labs  (09/13/2023): Hgb 13.1/MCV 103.8, ferritin 14, iron saturation 30% - PLAN: Instructed to RESTART ferrous sulfate 325 mg daily.   We will recheck iron panel in 6 months, and will consider IV iron if symptomatic and persistent ferritin <100.  4.  Osteoporosis - Most recent DEXA scan (03/06/2023): T-score -2.1, improved - DEXA scan (02/17/2021): T score -2.6 - DEXA scan from 01/09/2019 shows T score of -2.6.  This was slightly worse from the prior scans of -2.5, and -2.4 prior to that. - She received temporarily Prolia on 10/09/2014, 04/09/2015 and 09/28/2015.  This was discontinued secondary to high insurance cost. - She is currently taking Fosamax 70 mg weekly.  She takes Vitamin D daily. - No recent fractures, bone pain, jaw pain, or dental issues. - Most recent labs (09/13/2023): Calcium 9.1, vitamin D 44.02 - PLAN: Continue Fosamax 70 mg weekly for the duration of her antiestrogen therapy.  Continue calcium and vitamin D supplements. - Encourage weight bearing exercises -- Next DEXA in July 2026  PLAN SUMMARY:  >> Rx to pharmacy for B12 and iron supplements >> Labs in 6 months = CBC/D, CMP, B12, MMA, ferritin, iron/TIBC, vitamin D >> OFFICE visit  in 6 months (1 week after labs)    REVIEW OF SYSTEMS:  Review of Systems  Constitutional:  Negative for appetite change, chills, diaphoresis, fatigue, fever and unexpected weight change.  HENT:   Negative for lump/mass, nosebleeds and sore throat.   Eyes:  Negative for eye problems.  Respiratory:  Negative for cough, hemoptysis and shortness of breath.   Cardiovascular:  Negative for chest pain, leg swelling and palpitations.  Gastrointestinal:  Negative for abdominal pain, blood in stool, constipation, diarrhea, nausea and vomiting.  Genitourinary:  Negative for hematuria.   Skin: Negative.   Neurological:  Negative for dizziness, headaches and light-headedness.  Hematological:  Does not bruise/bleed easily.    PHYSICAL EXAM:  Performance  status (ECOG): 1 - Symptomatic but completely ambulatory  Vitals:   09/20/23 0859  BP: (!) 150/75  Pulse: 63  Resp: 18  Temp: 97.9 F (36.6 C)  SpO2: 100%   Wt Readings from Last 3 Encounters:  09/20/23 147 lb 12.8 oz (67 kg)  03/13/23 146 lb (66.2 kg)  09/16/22 150 lb (68 kg)   Physical Exam Constitutional:      Appearance: Normal appearance. She is obese.  HENT:     Head: Normocephalic and atraumatic.     Mouth/Throat:     Mouth: Mucous membranes are moist.  Eyes:     Extraocular Movements: Extraocular movements intact.     Pupils: Pupils are equal, round, and reactive to light.  Cardiovascular:     Rate and Rhythm: Normal rate and regular rhythm.     Pulses: Normal pulses.     Heart sounds: Normal heart sounds.  Pulmonary:     Effort: Pulmonary effort is normal.     Breath sounds: Normal breath sounds.  Chest:  Breasts:    Right: Normal.     Left: Absent.       Comments: Left mastectomy site within normal limits. No discrete nodules or masses palpated within the right breast. Abdominal:     General: Bowel sounds are normal.     Palpations: Abdomen is soft.     Tenderness: There is no abdominal tenderness.  Musculoskeletal:        General: No swelling.     Right lower leg: No edema.     Left lower leg: No edema.  Lymphadenopathy:     Cervical: No cervical adenopathy.     Upper Body:     Right upper body: No supraclavicular, axillary or pectoral adenopathy.     Left upper body: No supraclavicular, axillary or pectoral adenopathy.  Skin:    General: Skin is warm and dry.  Neurological:     General: No focal deficit present.     Mental Status: She is alert and oriented to person, place, and time.  Psychiatric:        Mood and Affect: Mood normal.        Behavior: Behavior normal.      PAST MEDICAL/SURGICAL HISTORY:  Past Medical History:  Diagnosis Date   Anxiety    B12 deficiency 01/27/2016   Barrett's esophagus    Breast cancer (HCC)    Breast  cancer, left breast (HCC)    Bronchitis 07/27/2011   GERD (gastroesophageal reflux disease)    Hypertension    Osteopenia 10/09/2014   Past Surgical History:  Procedure Laterality Date   BREAST BIOPSY Bilateral    X 4- benign   ESOPHAGOGASTRODUODENOSCOPY  08/03/2011   Procedure: ESOPHAGOGASTRODUODENOSCOPY (EGD);  Surgeon: Malissa Hippo, MD;  Location:  [facility];  Service: Endoscopy;  Laterality: N/A;  12:45   EXCISION MORTON'S NEUROMA  07/14/2011   Procedure: EXCISION MORTON'S NEUROMA;  Surgeon: Dallas Schimke;  Location: [facility];  Service: Orthopedics;  Laterality: Right;  Removal Neuroma 3rd Interspace Right Foot   MASTECTOMY Left    MASTECTOMY MODIFIED RADICAL Left 07/17/2013   Procedure: MASTECTOMY MODIFIED RADICAL WITH LEFT AXILLARY TISSUE REMOVAL;  Surgeon: Marlane Hatcher, MD;  Location: [facility];  Service: General;  Laterality: Left;   PARTIAL MASTECTOMY WITH NEEDLE LOCALIZATION Left 07/03/2013   Procedure: PARTIAL MASTECTOMY STATUS POST NEEDLE LOCALIZATION;  Surgeon: Marlane Hatcher, MD;  Location: [facility];  Service: General;  Laterality: Left;   PORT-A-CATH REMOVAL Right 04/27/2015   Procedure: REMOVAL PORT-A-CATH;  Surgeon: Franky Macho, MD;  Location: [facility];  Service: General;  Laterality: Right;   PORTACATH PLACEMENT Right 08/16/2013   Procedure: INSERTION PORT-A-CATH RIGHT SUBCLAVIAN;  Surgeon: Marlane Hatcher, MD;  Location: [facility];  Service: General;  Laterality: Right;    SOCIAL HISTORY:  Social History   Socioeconomic History   Marital status: Married    Spouse name: Not on file   Number of children: Not on file   Years of education: Not on file   Highest education level: Not on file  Occupational History   Not on file  Tobacco Use   Smoking status: Former    Current packs/day: 0.00    Average packs/day: 1 pack/day for 20.0 years (20.0 ttl pk-yrs)    Types: Cigarettes    Start date: 09/05/1973    Quit date: 09/05/1993    Years since quitting:  30.0   Smokeless tobacco: Never  Substance and Sexual Activity   Alcohol use: No    Comment: occasionally   Drug use: No   Sexual activity: Not on file  Other Topics Concern   Not on file  Social History Narrative   Not on file   Social Drivers of Health   Financial Resource Strain: Not on file  Food Insecurity: Not on file  Transportation Needs: Not on file  Physical Activity: Not on file  Stress: Not on file  Social Connections: Not on file  Intimate Partner Violence: Not on file    FAMILY HISTORY:  Family History  Problem Relation Age of Onset   Anesthesia problems Neg Hx    Hypotension Neg Hx    Malignant hyperthermia Neg Hx    Pseudochol deficiency Neg Hx     CURRENT MEDICATIONS:  Current Outpatient Medications  Medication Sig Dispense Refill   alendronate (FOSAMAX) 70 MG tablet TAKE 1 TABLET(70 MG) BY MOUTH 1 TIME A WEEK WITH A FULL GLASS OF WATER AND ON AN EMPTY STOMACH 4 tablet 6   anastrozole (ARIMIDEX) 1 MG tablet TAKE 1 TABLET(1 MG) BY MOUTH DAILY 30 tablet 6   Calcium Carbonate-Vitamin D (CALCIUM-VITAMIN D3 PO) Take by mouth.     diclofenac (VOLTAREN) 25 MG EC tablet Take 1 tablet (25 mg total) by mouth 2 (two) times daily as needed for moderate pain. 20 tablet 0   DULoxetine (CYMBALTA) 30 MG capsule Take 30 mg by mouth daily.     losartan-hydrochlorothiazide (HYZAAR) 100-25 MG tablet Take 1 tablet by mouth daily.     metoprolol succinate (TOPROL-XL) 25 MG 24 hr tablet Take 25 mg by mouth daily.     omeprazole (PRILOSEC) 20 MG capsule Take 20 mg by mouth 2 (two) times daily.      traZODone (DESYREL) 50 MG  tablet Take 25 mg by mouth at bedtime as needed for sleep. For sleep     cyanocobalamin (VITAMIN B12) 500 MCG tablet Take 2 tablets (1,000 mcg total) by mouth daily. 180 tablet 3   ferrous sulfate 325 (65 FE) MG EC tablet Take 1 tablet (325 mg total) by mouth daily with breakfast. 90 tablet 3   No current facility-administered medications for this visit.     ALLERGIES:  No Known Allergies  LABORATORY DATA:  I have reviewed the labs as listed.     Latest Ref Rng & Units 09/13/2023    9:39 AM 03/06/2023   10:26 AM 09/08/2022   10:56 AM  CBC  WBC 4.0 - 10.5 K/uL 6.0  5.0  6.6   Hemoglobin 12.0 - 15.0 g/dL 78.2  95.6  21.3   Hematocrit 36.0 - 46.0 % 40.8  38.4  38.4   Platelets 150 - 400 K/uL 196  184  210       Latest Ref Rng & Units 09/13/2023    9:39 AM 03/06/2023   10:26 AM 09/08/2022   10:56 AM  CMP  Glucose 70 - 99 mg/dL 086  578  469   BUN 8 - 23 mg/dL 18  25  20    Creatinine 0.44 - 1.00 mg/dL 6.29  5.28  4.13   Sodium 135 - 145 mmol/L 139  137  139   Potassium 3.5 - 5.1 mmol/L 3.5  3.4  3.4   Chloride 98 - 111 mmol/L 102  102  104   CO2 22 - 32 mmol/L 29  28  27    Calcium 8.9 - 10.3 mg/dL 9.1  9.0  9.2   Total Protein 6.5 - 8.1 g/dL 7.1  7.0  7.1   Total Bilirubin 0.0 - 1.2 mg/dL 0.5  0.4  0.5   Alkaline Phos 38 - 126 U/L 70  74  80   AST 15 - 41 U/L 18  19  21    ALT 0 - 44 U/L 18  22  19      DIAGNOSTIC IMAGING:  I have independently reviewed the scans and discussed with the patient. No results found.   WRAP UP:  All questions were answered. The patient knows to call the clinic with any problems, questions or concerns.  Medical decision making: Moderate  Time spent on visit: I spent 20 minutes counseling the patient face to face. The total time spent in the appointment was 30 minutes and more than 50% was on counseling.  Carnella Guadalajara, PA-C  09/20/23 9:49 AM

## 2023-09-20 ENCOUNTER — Ambulatory Visit: Payer: Medicare Other | Admitting: Physician Assistant

## 2023-09-20 ENCOUNTER — Inpatient Hospital Stay: Payer: Medicare Other | Admitting: Physician Assistant

## 2023-09-20 VITALS — BP 150/75 | HR 63 | Temp 97.9°F | Resp 18 | Ht 62.0 in | Wt 147.8 lb

## 2023-09-20 DIAGNOSIS — D7589 Other specified diseases of blood and blood-forming organs: Secondary | ICD-10-CM

## 2023-09-20 DIAGNOSIS — C50919 Malignant neoplasm of unspecified site of unspecified female breast: Secondary | ICD-10-CM | POA: Diagnosis not present

## 2023-09-20 DIAGNOSIS — Z1721 Progesterone receptor positive status: Secondary | ICD-10-CM | POA: Diagnosis not present

## 2023-09-20 DIAGNOSIS — C50912 Malignant neoplasm of unspecified site of left female breast: Secondary | ICD-10-CM | POA: Diagnosis not present

## 2023-09-20 DIAGNOSIS — E611 Iron deficiency: Secondary | ICD-10-CM | POA: Diagnosis not present

## 2023-09-20 DIAGNOSIS — C773 Secondary and unspecified malignant neoplasm of axilla and upper limb lymph nodes: Secondary | ICD-10-CM

## 2023-09-20 DIAGNOSIS — D509 Iron deficiency anemia, unspecified: Secondary | ICD-10-CM | POA: Diagnosis not present

## 2023-09-20 DIAGNOSIS — M810 Age-related osteoporosis without current pathological fracture: Secondary | ICD-10-CM | POA: Diagnosis not present

## 2023-09-20 DIAGNOSIS — E538 Deficiency of other specified B group vitamins: Secondary | ICD-10-CM

## 2023-09-20 DIAGNOSIS — Z9012 Acquired absence of left breast and nipple: Secondary | ICD-10-CM | POA: Diagnosis not present

## 2023-09-20 DIAGNOSIS — Z87891 Personal history of nicotine dependence: Secondary | ICD-10-CM | POA: Diagnosis not present

## 2023-09-20 DIAGNOSIS — Z923 Personal history of irradiation: Secondary | ICD-10-CM | POA: Diagnosis not present

## 2023-09-20 DIAGNOSIS — Z170 Estrogen receptor positive status [ER+]: Secondary | ICD-10-CM | POA: Diagnosis not present

## 2023-09-20 DIAGNOSIS — Z1732 Human epidermal growth factor receptor 2 negative status: Secondary | ICD-10-CM | POA: Diagnosis not present

## 2023-09-20 DIAGNOSIS — Z79811 Long term (current) use of aromatase inhibitors: Secondary | ICD-10-CM | POA: Diagnosis not present

## 2023-09-20 MED ORDER — FERROUS SULFATE 325 (65 FE) MG PO TBEC: 325.0000 mg | DELAYED_RELEASE_TABLET | Freq: Every day | ORAL | 3 refills | Status: AC

## 2023-09-20 MED ORDER — CYANOCOBALAMIN 500 MCG PO TABS: 1000.0000 ug | ORAL_TABLET | Freq: Every day | ORAL | 3 refills | Status: AC

## 2023-09-20 NOTE — Patient Instructions (Signed)
[facility] at [facility] **VISIT SUMMARY & IMPORTANT INSTRUCTIONS **   You were seen today by Heather Brenner PA-C for your follow-up visit.    HISTORY OF BREAST CANCER:  At this time, you do NOT have any signs of recurrent breast cancer.  Your most recent mammogram was normal and your labs do not show any major abnormalities.  Physical exam today was normal.  Continue to take your anastrozole/Arimidex (breast cancer pill) once daily.  OSTEOPOROSIS: Your bone density scan showed improvement in your bone strength!  Continue taking Fosamax once a week.  Continue your calcium and vitamin D supplements.  IRON DEFICIENCY: Please restart your iron tablet once daily.  VITAMIN B-12 DEFICIENCY Restart your vitamin B12 supplement 1000 mcg daily  FOLLOW-UP APPOINTMENT:  Office visit in 6 months, with labs 1 week before  ** Thank you for trusting me with your healthcare!  I strive to provide all of my patients with quality care at each visit.  If you receive a survey for this visit, I would be so grateful to you for taking the time to provide feedback.  Thank you in advance!  ~ Burnette Sautter                   Dr. Doreatha Massed   &   Heather Brenner, PA-C   - - - - - - - - - - - - - - - - - -    Thank you for choosing [facility] at [facility] to provide your oncology and hematology care.  To afford each patient quality time with our provider, please arrive at least 15 minutes before your scheduled appointment time.   If you have a lab appointment with the Cancer Center please come in thru the Main Entrance and check in at the main information desk.  You need to re-schedule your appointment should you arrive 10 or more minutes late.  We strive to give you quality time with our providers, and arriving late affects you and other patients whose appointments are after yours.  Also, if you no show three or more times for appointments you may be dismissed  from the clinic at the providers discretion.     Again, thank you for choosing [facility].  Our hope is that these requests will decrease the amount of time that you wait before being seen by our physicians.       _____________________________________________________________  Should you have questions after your visit to [facility], please contact our office at [phone] and follow the prompts.  Our office hours are 8:00 a.m. and 4:30 p.m. Monday - Friday.  Please note that voicemails left after 4:00 p.m. may not be returned until the following business day.  We are closed weekends and major holidays.  You do have access to a nurse 24-7, just call the main number to the clinic [phone] and do not press any options, hold on the line and a nurse will answer the phone.    For prescription refill requests, have your pharmacy contact our office and allow 72 hours.

## 2023-10-11 ENCOUNTER — Ambulatory Visit
Admission: RE | Admit: 2023-10-11 | Discharge: 2023-10-11 | Disposition: A | Discharge status: Home / Self Care | Payer: Medicare Other | Source: Ambulatory Visit | Attending: Family Medicine | Admitting: Family Medicine

## 2023-10-11 VITALS — BP 120/70 | HR 94 | Temp 99.4°F | Resp 16

## 2023-10-11 DIAGNOSIS — J069 Acute upper respiratory infection, unspecified: Secondary | ICD-10-CM

## 2023-10-11 LAB — POC COVID19/FLU A&B COMBO
Covid Antigen, POC: NEGATIVE
Influenza A Antigen, POC: NEGATIVE
Influenza B Antigen, POC: NEGATIVE

## 2023-10-11 MED ORDER — PROMETHAZINE-DM 6.25-15 MG/5ML PO SYRP: 5.0000 mL | ORAL_SOLUTION | Freq: Four times a day (QID) | ORAL | 0 refills | Status: AC | PRN

## 2023-10-11 NOTE — ED Triage Notes (Signed)
Pt states cough and chest congestion for the past 3 days.

## 2023-10-11 NOTE — ED Provider Notes (Signed)
[facility]    CSN: [id number] Arrival date & time: 10/11/23  1152      History   Chief Complaint Chief Complaint  Patient presents with   Cough    HPI Heather Vaughan is a 68 y.o. female.   Patient presenting today with 3-day history of cough, congestion, fatigue.  Denies fever, chills, chest pain, shortness of breath, abdominal pain, nausea vomiting or diarrhea.  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  No known history of chronic pulmonary disease.  Trying Coricidin HBP with mild temporary benefit.    Past Medical History:  Diagnosis Date   Anxiety    B12 deficiency 01/27/2016   Barrett's esophagus    Breast cancer (HCC)    Breast cancer, left breast (HCC)    Bronchitis 07/27/2011   GERD (gastroesophageal reflux disease)    Hypertension    Osteopenia 10/09/2014    Patient Active Problem List   Diagnosis Date Noted   B12 deficiency 01/27/2016   Osteoporosis 10/09/2014   Peripheral neuropathy due to chemotherapy (HCC) 05/08/2014   Unstable balance 05/08/2014   Stiffness of joint, not elsewhere classified, ankle and foot 05/08/2014   Ankle weakness 05/08/2014   Invasive ductal carcinoma metastasized to axillary lymph node, left 08/09/2013   Barrett esophagus 12/22/2011   GERD (gastroesophageal reflux disease) 06/23/2011   Hypertension 06/23/2011    Past Surgical History:  Procedure Laterality Date   BREAST BIOPSY Bilateral    X 4- benign   ESOPHAGOGASTRODUODENOSCOPY  08/03/2011   Procedure: ESOPHAGOGASTRODUODENOSCOPY (EGD);  Surgeon: Malissa Hippo, MD;  [facility]: [facility];  Service: Endoscopy;  Laterality: N/A;  12:45   EXCISION MORTON'S NEUROMA  07/14/2011   Procedure: EXCISION MORTON'S NEUROMA;  Surgeon: Dallas Schimke;  Location: [facility];  Service: Orthopedics;  Laterality: Right;  Removal Neuroma 3rd Interspace Right Foot   MASTECTOMY Left    MASTECTOMY MODIFIED RADICAL Left 07/17/2013   Procedure: MASTECTOMY  MODIFIED RADICAL WITH LEFT AXILLARY TISSUE REMOVAL;  Surgeon: Marlane Hatcher, MD;  Location: [facility];  Service: General;  Laterality: Left;   PARTIAL MASTECTOMY WITH NEEDLE LOCALIZATION Left 07/03/2013   Procedure: PARTIAL MASTECTOMY STATUS POST NEEDLE LOCALIZATION;  Surgeon: Marlane Hatcher, MD;  Location: [facility];  Service: General;  Laterality: Left;   PORT-A-CATH REMOVAL Right 04/27/2015   Procedure: REMOVAL PORT-A-CATH;  Surgeon: Franky Macho, MD;  Location: [facility];  Service: General;  Laterality: Right;   PORTACATH PLACEMENT Right 08/16/2013   Procedure: INSERTION PORT-A-CATH RIGHT SUBCLAVIAN;  Surgeon: Marlane Hatcher, MD;  Location: [facility];  Service: General;  Laterality: Right;    OB History   No obstetric history on file.      Home Medications    Prior to Admission medications   Medication Sig Start Date End Date Taking? Authorizing Provider  promethazine-dextromethorphan (PROMETHAZINE-DM) 6.25-15 MG/5ML syrup Take 5 mLs by mouth 4 (four) times daily as needed. 10/11/23  Yes Particia Nearing, PA-C  alendronate (FOSAMAX) 70 MG tablet TAKE 1 TABLET(70 MG) BY MOUTH 1 TIME A WEEK WITH A FULL GLASS OF WATER AND ON AN EMPTY STOMACH 03/22/23   Doreatha Massed, MD  anastrozole (ARIMIDEX) 1 MG tablet TAKE 1 TABLET(1 MG) BY MOUTH DAILY 06/21/23   Rojelio Brenner M, PA-C  Calcium Carbonate-Vitamin D (CALCIUM-VITAMIN D3 PO) Take by mouth.    [provider]  cyanocobalamin (VITAMIN B12) 500 MCG tablet Take 2 tablets (1,000 mcg total) by mouth daily. 09/20/23   Buford Dresser,  Rushie Goltz, PA-C  diclofenac (VOLTAREN) 25 MG EC tablet Take 1 tablet (25 mg total) by mouth 2 (two) times daily as needed for moderate pain. 03/08/21   Bing Neighbors, NP  DULoxetine (CYMBALTA) 30 MG capsule Take 30 mg by mouth daily.    [provider]  ferrous sulfate 325 (65 FE) MG EC tablet Take 1 tablet (325 mg total) by mouth daily with breakfast. 09/20/23   Rojelio Brenner M,  PA-C  losartan-hydrochlorothiazide (HYZAAR) 100-25 MG tablet Take 1 tablet by mouth daily. 04/27/22   [provider]  metoprolol succinate (TOPROL-XL) 25 MG 24 hr tablet Take 25 mg by mouth daily. 07/21/23   [provider]  omeprazole (PRILOSEC) 20 MG capsule Take 20 mg by mouth 2 (two) times daily.  04/14/15   [provider]  traZODone (DESYREL) 50 MG tablet Take 25 mg by mouth at bedtime as needed for sleep. For sleep 06/02/11   [provider]    Family History Family History  Problem Relation Age of Onset   Anesthesia problems Neg Hx    Hypotension Neg Hx    Malignant hyperthermia Neg Hx    Pseudochol deficiency Neg Hx     Social History Social History   Tobacco Use   Smoking status: Former    Current packs/day: 0.00    Average packs/day: 1 pack/day for 20.0 years (20.0 ttl pk-yrs)    Types: Cigarettes    Start date: 09/05/1973    Quit date: 09/05/1993    Years since quitting: 30.1   Smokeless tobacco: Never  Substance Use Topics   Alcohol use: No    Comment: occasionally   Drug use: No     Allergies   Patient has no known allergies.   Review of Systems Review of Systems Per HPI  Physical Exam Triage Vital Signs ED Triage Vitals  Encounter Vitals Group     BP 10/11/23 1207 120/70     Systolic BP Percentile --      Diastolic BP Percentile --      Pulse Rate 10/11/23 1207 94     Resp 10/11/23 1207 16     Temp 10/11/23 1206 99.4 F (37.4 C)     Temp Source 10/11/23 1206 Oral     SpO2 10/11/23 1207 93 %     Weight --      Height --      Head Circumference --      Peak Flow --      Pain Score 10/11/23 1207 0     Pain Loc --      Pain Education --      Exclude from Growth Chart --    No data found.  Updated Vital Signs BP 120/70 (BP Location: Right Arm)   Pulse 94   Temp 99.4 F (37.4 C) (Oral)   Resp 16   SpO2 96%   Visual Acuity Right Eye Distance:   Left Eye Distance:   Bilateral Distance:    Right Eye  Near:   Left Eye Near:    Bilateral Near:     Physical Exam Vitals and nursing note reviewed.  Constitutional:      Appearance: Normal appearance.  HENT:     Head: Atraumatic.     Right Ear: Tympanic membrane and external ear normal.     Left Ear: Tympanic membrane and external ear normal.     Nose: Rhinorrhea present.     Mouth/Throat:     Mouth: Mucous  membranes are moist.     Pharynx: Posterior oropharyngeal erythema present.  Eyes:     Extraocular Movements: Extraocular movements intact.     Conjunctiva/sclera: Conjunctivae normal.  Cardiovascular:     Rate and Rhythm: Normal rate and regular rhythm.     Heart sounds: Normal heart sounds.  Pulmonary:     Effort: Pulmonary effort is normal.     Breath sounds: Normal breath sounds. No wheezing or rales.  Musculoskeletal:        General: Normal range of motion.     Cervical back: Normal range of motion and neck supple.  Skin:    General: Skin is warm and dry.  Neurological:     Mental Status: She is alert and oriented to person, place, and time.  Psychiatric:        Mood and Affect: Mood normal.        Thought Content: Thought content normal.      UC Treatments / Results  Labs (all labs ordered are listed, but only abnormal results are displayed) Labs Reviewed  POC COVID19/FLU A&B COMBO    EKG   Radiology No results found.  Procedures Procedures (including critical care time)  Medications Ordered in UC Medications - No data to display  Initial Impression / Assessment and Plan / UC Course  I have reviewed the triage vital signs and the nursing notes.  Pertinent labs & imaging results that were available during my care of the patient were reviewed by me and considered in my medical decision making (see chart for details).     Vitals and exam reassuring, rapid flu and COVID-negative.  Suspect viral URI.  Treat with Phenergan DM, supportive over-the-counter medications and home care.  Return for worsening  symptoms.  Work note given.  Final Clinical Impressions(s) / UC Diagnoses   Final diagnoses:  Viral URI with cough   Discharge Instructions   None    ED Prescriptions     Medication Sig Dispense Auth. Provider   promethazine-dextromethorphan (PROMETHAZINE-DM) 6.25-15 MG/5ML syrup Take 5 mLs by mouth 4 (four) times daily as needed. 100 mL Particia Nearing, [place]      PDMP not reviewed this encounter.   Particia Nearing, [place] 10/11/23 1254

## 2023-10-18 ENCOUNTER — Other Ambulatory Visit: Payer: Self-pay | Admitting: Hematology

## 2023-10-18 DIAGNOSIS — M810 Age-related osteoporosis without current pathological fracture: Secondary | ICD-10-CM

## 2024-01-15 ENCOUNTER — Other Ambulatory Visit: Payer: Self-pay | Admitting: Physician Assistant

## 2024-01-15 DIAGNOSIS — C50912 Malignant neoplasm of unspecified site of left female breast: Secondary | ICD-10-CM

## 2024-03-13 ENCOUNTER — Inpatient Hospital Stay: Payer: Medicare Other | Attending: Hematology

## 2024-03-13 DIAGNOSIS — C50912 Malignant neoplasm of unspecified site of left female breast: Secondary | ICD-10-CM

## 2024-03-13 DIAGNOSIS — D7589 Other specified diseases of blood and blood-forming organs: Secondary | ICD-10-CM

## 2024-03-13 DIAGNOSIS — M810 Age-related osteoporosis without current pathological fracture: Secondary | ICD-10-CM | POA: Diagnosis not present

## 2024-03-13 DIAGNOSIS — D509 Iron deficiency anemia, unspecified: Secondary | ICD-10-CM

## 2024-03-13 DIAGNOSIS — E538 Deficiency of other specified B group vitamins: Secondary | ICD-10-CM | POA: Insufficient documentation

## 2024-03-13 DIAGNOSIS — E611 Iron deficiency: Secondary | ICD-10-CM | POA: Diagnosis not present

## 2024-03-13 DIAGNOSIS — Z79899 Other long term (current) drug therapy: Secondary | ICD-10-CM | POA: Insufficient documentation

## 2024-03-13 DIAGNOSIS — C50919 Malignant neoplasm of unspecified site of unspecified female breast: Secondary | ICD-10-CM

## 2024-03-13 LAB — COMPREHENSIVE METABOLIC PANEL WITH GFR
ALT: 19 U/L (ref 0–44)
AST: 20 U/L (ref 15–41)
Albumin: 4.1 g/dL (ref 3.5–5.0)
Alkaline Phosphatase: 60 U/L (ref 38–126)
Anion gap: 9 (ref 5–15)
BUN: 19 mg/dL (ref 8–23)
CO2: 28 mmol/L (ref 22–32)
Calcium: 9.2 mg/dL (ref 8.9–10.3)
Chloride: 104 mmol/L (ref 98–111)
Creatinine, Ser: 0.59 mg/dL (ref 0.44–1.00)
GFR, Estimated: >60 mL/min (ref >60)
Glucose, Bld: 93 mg/dL (ref 70–99)
Potassium: 3.4 mmol/L — ABNORMAL LOW (ref 3.5–5.1)
Sodium: 141 mmol/L (ref 135–145)
Total Bilirubin: 0.6 mg/dL (ref 0.0–1.2)
Total Protein: 6.9 g/dL (ref 6.5–8.1)

## 2024-03-13 LAB — CBC WITH DIFFERENTIAL/PLATELET
Abs Immature Granulocytes: 0.01 K/uL (ref 0.00–0.07)
Basophils Absolute: 0 K/uL (ref 0.0–0.1)
Basophils Relative: 1 %
Eosinophils Absolute: 0.1 K/uL (ref 0.0–0.5)
Eosinophils Relative: 3 %
HCT: 39.6 % (ref 36.0–46.0)
Hemoglobin: 13 g/dL (ref 12.0–15.0)
Immature Granulocytes: 0 %
Lymphocytes Relative: 32 %
Lymphs Abs: 1.3 K/uL (ref 0.7–4.0)
MCH: 33.6 pg (ref 26.0–34.0)
MCHC: 32.8 g/dL (ref 30.0–36.0)
MCV: 102.3 fL — ABNORMAL HIGH (ref 80.0–100.0)
Monocytes Absolute: 0.4 K/uL (ref 0.1–1.0)
Monocytes Relative: 10 %
Neutro Abs: 2.3 K/uL (ref 1.7–7.7)
Neutrophils Relative %: 54 %
Platelets: 170 K/uL (ref 150–400)
RBC: 3.87 MIL/uL (ref 3.87–5.11)
RDW: 12.1 % (ref 11.5–15.5)
WBC: 4.1 K/uL (ref 4.0–10.5)
nRBC: 0 % (ref 0.0–0.2)

## 2024-03-13 LAB — IRON AND TIBC
Iron: 109 ug/dL (ref 28–170)
Saturation Ratios: 30 % (ref 10.4–31.8)
TIBC: 362 ug/dL (ref 250–450)
UIBC: 253 ug/dL

## 2024-03-13 LAB — VITAMIN D 25 HYDROXY (VIT D DEFICIENCY, FRACTURES): Vit D, 25-Hydroxy: 62.53 ng/mL (ref 30–100)

## 2024-03-13 LAB — FERRITIN: Ferritin: 34 ng/mL (ref 11–307)

## 2024-03-13 LAB — VITAMIN B12: Vitamin B-12: 324 pg/mL (ref 180–914)

## 2024-03-15 LAB — METHYLMALONIC ACID, SERUM: Methylmalonic Acid, Quantitative: 450 nmol/L — ABNORMAL HIGH (ref 0–378)

## 2024-03-20 ENCOUNTER — Inpatient Hospital Stay

## 2024-03-20 ENCOUNTER — Inpatient Hospital Stay: Payer: Medicare Other | Admitting: Oncology

## 2024-03-20 ENCOUNTER — Encounter: Payer: Self-pay | Admitting: Oncology

## 2024-03-20 DIAGNOSIS — E611 Iron deficiency: Secondary | ICD-10-CM | POA: Diagnosis not present

## 2024-03-20 DIAGNOSIS — C50919 Malignant neoplasm of unspecified site of unspecified female breast: Secondary | ICD-10-CM | POA: Diagnosis not present

## 2024-03-20 DIAGNOSIS — D509 Iron deficiency anemia, unspecified: Secondary | ICD-10-CM

## 2024-03-20 DIAGNOSIS — C50912 Malignant neoplasm of unspecified site of left female breast: Secondary | ICD-10-CM | POA: Diagnosis not present

## 2024-03-20 DIAGNOSIS — M810 Age-related osteoporosis without current pathological fracture: Secondary | ICD-10-CM | POA: Diagnosis not present

## 2024-03-20 DIAGNOSIS — E538 Deficiency of other specified B group vitamins: Secondary | ICD-10-CM | POA: Diagnosis not present

## 2024-03-20 DIAGNOSIS — C773 Secondary and unspecified malignant neoplasm of axilla and upper limb lymph nodes: Secondary | ICD-10-CM | POA: Diagnosis not present

## 2024-03-20 DIAGNOSIS — D7589 Other specified diseases of blood and blood-forming organs: Secondary | ICD-10-CM

## 2024-03-20 DIAGNOSIS — Z79899 Other long term (current) drug therapy: Secondary | ICD-10-CM | POA: Diagnosis not present

## 2024-03-20 MED ORDER — CYANOCOBALAMIN 1000 MCG/ML IJ SOLN: 1000.0000 ug | Freq: Once | INTRAMUSCULAR | 0 refills | Status: AC

## 2024-03-20 MED ORDER — BD SYRINGE/NEEDLE 23G X 1" 3 ML MISC: 1.0000 mL | 0 refills | Status: AC

## 2024-03-20 MED ORDER — CYANOCOBALAMIN 1000 MCG/ML IJ SOLN
1000.0000 ug | Freq: Once | INTRAMUSCULAR | Status: AC
  Administered 2024-03-20: 1000 ug via INTRAMUSCULAR
  Filled 2024-03-20: qty 1

## 2024-03-20 NOTE — Progress Notes (Signed)
 RN educated and demonstrated on how to administer B12 injections at home. Pt verbalized understanding and demonstrated proper administration. All questions answered.   Patient tolerated B12 injection with no complaints voiced.  Site clean and dry with no bruising or swelling noted at site.  See MAR for details.  Band aid applied.  Patient stable during and after injection.  Vss with discharge and left in satisfactory condition with no s/s of distress noted.   Heather Vaughan

## 2024-03-20 NOTE — Progress Notes (Signed)
 [facility] [address], [place] [address]   CLINIC:  Medical Oncology/Hematology  PCP:  Heather Vaughan [address] / [place] [address] [phone]   BRIEF ONCOLOGIC HISTORY:   Oncology History  Invasive ductal carcinoma metastasized to axillary lymph node, left  05/28/2013 Imaging   Screening mammogram   06/19/2013 Imaging   Left diagnostic mammogram   06/25/2013 Imaging   CT CAP- borderline enlarged axillary lymph node on left.  Small pulmonary nodules noted but too small for PET or biopsy; surveillance recommended.    07/03/2013 Surgery   Left partial mastectomy.  Invasive ductal carcinoma. 1.4 cm.  POSITIVE MARGINS.  Additional extensive DCIS noted.  ER 74%, PR 16%, Ki-67 52%, HER2 NEGATIVE.   07/17/2013 Surgery   Left modified radiacl mastectomy with left axillary node dissection.  Negative residual cancer.  Negative inked margins.  1/12 positive lymph nodes.  FINAL MARGINS NEGATIVE.  LYMPH NODE IS HER2 POSITIVE.   08/13/2013 Imaging   MUGA scan shows left ventricular EF of 62%   08/27/2013 - 12/18/2013 Chemotherapy   TCH x 6 cycles   01/07/2014 -  Chemotherapy   Herceptin  x 52 weeks   01/27/2014 - 03/13/2014 Radiation Therapy   By Dr. Keenan   03/29/2014 -  Anti-estrogen oral therapy   Aromasin    10/24/2014 Imaging   Bone density- BMD as determined from Femur Neck Right is 0.698 g/cm2 with a T-Score of -2.4. This patient is considered osteopenic according to World Health Organization [facility]) criteria. (Lumbar spine was not utilized due to advanced degenerative changes).   11/05/2014 Imaging   MUGA- Left ventricular ejection fraction equals 70%. No change from prior.   04/27/2015 Procedure   Port-A-Cath removal by Dr. Mavis   06/19/2015 Imaging   CT chest- Status post left mastectomy, without recurrent or metastatic disease. Decrease in size of right-sided pulmonary nodules, favoring an infectious or inflammatory  etiology.   06/21/2017 Imaging   CT Chest w contrast: IMPRESSION: 1. No evidence of metastatic disease in the thorax. Multiple previously noted tiny pulmonary nodules are stable compared to prior studies and considered benign. 2. Aortic atherosclerosis. 3. Mild diffuse bronchial wall thickening with mild centrilobular and paraseptal emphysema most apparent the lung apices; imaging findings that may suggest underlying COPD.     CANCER STAGING:  Cancer Staging  Invasive ductal carcinoma metastasized to axillary lymph node, left Staging form: Breast, AJCC 7th Edition - Clinical: Stage IIA (T1c, N1, cM0) - Signed by Berry Debby RAMAN, PA-C on 09/15/2013   INTERVAL HISTORY:   Heather Vaughan, a 68 y.o. female, returns for routine follow-up of her  history of stage IIa left-sided breast cancer.   At today's visit, she reports feeling well.  She denies any recent hospitalizations, surgeries, or changes in her  baseline health status.  She denies any symptoms of recurrence such as new lumps, bone pain, chest pain, dyspnea, or abdominal pain.   She has no new headaches, seizures, or focal neurologic deficits.  No B symptoms such as fever, chills, night sweats, unintentional weight loss.   She continues to take daily Arimidex  and is tolerating it fairly well.  She does have mild infrequent hot flashes.  She denies any mood swings, alopecia, or abnormal musculoskeletal pain.  She is taking Fosamax  for her osteoporosis in the setting of aromatase inhibitor use.  She denies any recent bone pain, fractures, jaw pain, or dental issues.  She denies any rectal bleeding, melena,  or other signs of blood loss.   She denies any fatigue or ice pica.    She reports 75% energy and 100% appetite.  She is maintaining stable weight at this time.  ASSESSMENT & PLAN:  1.  Left breast cancer (stage IIa) s/p left modified radical mastectomy - Left modified radical mastectomy on 07/17/2013, 1/12 lymph  nodes positive, grade 2, 1.4 cm invasive tumor, margins negative, ER 74% positive, PR 16% positive, HER-2 negative, Ki-67 52%, PT1CPN1A.  HER-2/neu was positive on testing by [facility] on lymph node. - 6 cycles of TCH from 08/27/2013 through 12/18/2013, 1 year Herceptin  completed on 08/28/2014. - Status post XRT completed in July 2015. - Anastrozole  started in August 2015.  Goal is 10 years of therapy. - She is tolerating anastrozole  very well, apart from some infrequent mild hot flashes - Physical examination (09/20/2023) shows left mastectomy within normal limits.  Right breast has no palpable masses.  No palpable adenopathy. - No red flag symptoms of recurrence per patient - Reviewed labs from 03/13/2024 which showed normal LFTs and baseline CBC.   - Most recent right breast mammogram (08/16/2023): BI-RADS Category 1, negative  - Recommend follow-up in 6 months with labs a few days before. -She is due for mammogram in December 2025.  Orders placed. -Continue anastrozole  through next month and then you can stop.   2.  Vitamin B12 deficiency - Patient has chronic mild macrocytosis - Nutritional panel checked on 09/08/2022 showed marginal vitamin B12 at 275, with elevated MMA 591.  Normal folate. - She received vitamin B12 injection on 09/15/2022.  She was previously taking vitamin B12 1000 mcg daily, but stopped a few months ago - Most recent labs (09/13/2023): Vitamin B12 324 , MMA remains elevated at 450. -We discussed continuing oral B12 versus starting B12 shots.  She would like to try B12 injections.  She will get her first injection today while in clinic and will be educated/instructed on how to give them to herself at home.  New prescription sent to her pharmacy.  She knows she can call the clinic for any concerns or questions regarding administration of B12.   3.  Iron deficiency without anemia - Nutritional panel from 09/08/2022 revealed low ferritin at 17, iron saturation 34% - No bright red  blood per rectum or melena - No pica or fatigue. - She has been taking ferrous sulfate  325 mg daily since January 2024, but stopped a few months ago - Most recent labs (03/13/24): Hgb 13.0/MCV 102.3, ferritin 34, iron saturation 30% -We discussed continuing iron supplements at this time.  She appears to be tolerating them well.   4.  Osteoporosis - Most recent DEXA scan (03/06/2023): T-score -2.1, improved - DEXA scan (02/17/2021): T score -2.6 - DEXA scan from 01/09/2019 shows T score of -2.6.  This was slightly worse from the prior scans of -2.5, and -2.4 prior to that. - She received temporarily Prolia  on 10/09/2014, 04/09/2015 and 09/28/2015.  This was discontinued secondary to high insurance cost. - She is currently taking Fosamax  70 mg weekly.  She takes Vitamin D  daily. - No recent fractures, bone pain, jaw pain, or dental issues. - Most recent labs (03/13/2024): Calcium  9.2, vitamin D  62.53 - Continue Fosamax  70 mg weekly for the duration of her antiestrogen therapy.   -Continue calcium  and vitamin D  supplements. - Encourage weight bearing exercises -- Next DEXA in July 2026  PLAN SUMMARY:  >> New Rx for B12 injections sent to her pharmacy. >>  She will get a B12 shot today and educated on how to administer at home. >> Labs in 6 months = CBC/D, CMP, B12, MMA, ferritin, iron/TIBC, vitamin D  >> Arlyss orders placed for December 2025. >> OFFICE visit in 6 months (1 week after labs)    REVIEW OF SYSTEMS:  Review of Systems  Constitutional:  Negative for fatigue.  Musculoskeletal:  Negative for arthralgias, back pain and flank pain.  Neurological:  Negative for dizziness and headaches.  Hematological:  Negative for adenopathy.    PHYSICAL EXAM:  Performance status (ECOG): 1 - Symptomatic but completely ambulatory  Vitals:   03/20/24 1423  BP: 136/68  Pulse: (!) 54  Resp: 18  Temp: (!) 97.1 F (36.2 C)  SpO2: 99%   Wt Readings from Last 3 Encounters:  03/20/24 133 lb 9.6 oz  (60.6 kg)  09/20/23 147 lb 12.8 oz (67 kg)  03/13/23 146 lb (66.2 kg)   Physical Exam Constitutional:      Appearance: Normal appearance.  HENT:     Head: Normocephalic and atraumatic.  Eyes:     Pupils: Pupils are equal, round, and reactive to light.  Cardiovascular:     Rate and Rhythm: Normal rate and regular rhythm.     Heart sounds: Normal heart sounds. No murmur heard. Pulmonary:     Effort: Pulmonary effort is normal.     Breath sounds: Normal breath sounds. No wheezing.  Chest:  Breasts:    Right: Normal.     Left: Absent.     Comments: Left mastectomy site within normal limits. No discrete nodules or masses palpated within the right breast. Abdominal:     General: Bowel sounds are normal. There is no distension.     Palpations: Abdomen is soft.     Tenderness: There is no abdominal tenderness.  Musculoskeletal:        General: Normal range of motion.     Cervical back: Normal range of motion.  Skin:    General: Skin is warm and dry.     Findings: No rash.  Neurological:     Mental Status: She is alert and oriented to person, place, and time.  Psychiatric:        Judgment: Judgment normal.      PAST MEDICAL/SURGICAL HISTORY:  Past Medical History:  Diagnosis Date   Anxiety    B12 deficiency 01/27/2016   Barrett's esophagus    Breast cancer (HCC)    Breast cancer, left breast (HCC)    Bronchitis 07/27/2011   GERD (gastroesophageal reflux disease)    Hypertension    Osteopenia 10/09/2014   Past Surgical History:  Procedure Laterality Date   BREAST BIOPSY Bilateral    X 4- benign   ESOPHAGOGASTRODUODENOSCOPY  08/03/2011   Procedure: ESOPHAGOGASTRODUODENOSCOPY (EGD);  Surgeon: Claudis RAYMOND Rivet, MD;  [facility];  Service: Endoscopy;  Laterality: N/A;  12:45   EXCISION MORTON'S NEUROMA  07/14/2011   Procedure: EXCISION MORTON'S NEUROMA;  Surgeon: Morene Donley Anon;  Location: [facility];  Service: Orthopedics;  Laterality: Right;  Removal  Neuroma 3rd Interspace Right Foot   MASTECTOMY Left    MASTECTOMY MODIFIED RADICAL Left 07/17/2013   Procedure: MASTECTOMY MODIFIED RADICAL WITH LEFT AXILLARY TISSUE REMOVAL;  Surgeon: Elsie GORMAN Holland, MD;  Location: [facility];  Service: General;  Laterality: Left;   PARTIAL MASTECTOMY WITH NEEDLE LOCALIZATION Left 07/03/2013   Procedure: PARTIAL MASTECTOMY STATUS POST NEEDLE LOCALIZATION;  Surgeon: Elsie GORMAN Holland, MD;  Location: [facility];  Service:  General;  Laterality: Left;   PORT-A-CATH REMOVAL Right 04/27/2015   Procedure: REMOVAL PORT-A-CATH;  Surgeon: Oneil Budge, MD;  Location: [facility];  Service: General;  Laterality: Right;   PORTACATH PLACEMENT Right 08/16/2013   Procedure: INSERTION PORT-A-CATH RIGHT SUBCLAVIAN;  Surgeon: Elsie GORMAN Holland, MD;  Location: [facility];  Service: General;  Laterality: Right;    SOCIAL HISTORY:  Social History   Socioeconomic History   Marital status: Married    Spouse name: Not on file   Number of children: Not on file   Years of education: Not on file   Highest education level: Not on file  Occupational History   Not on file  Tobacco Use   Smoking status: Former    Current packs/day: 0.00    Average packs/day: 1 pack/day for 20.0 years (20.0 ttl pk-yrs)    Types: Cigarettes    Start date: 09/05/1973    Quit date: 09/05/1993    Years since quitting: 30.5   Smokeless tobacco: Never  Substance and Sexual Activity   Alcohol use: No    Comment: occasionally   Drug use: No   Sexual activity: Not on file  Other Topics Concern   Not on file  Social History Narrative   Not on file   Social Drivers of Health   Financial Resource Strain: Not on file  Food Insecurity: Not on file  Transportation Needs: Not on file  Physical Activity: Not on file  Stress: Not on file  Social Connections: Not on file  Intimate Partner Violence: Not on file    FAMILY HISTORY:  Family History  Problem Relation Age of Onset   Anesthesia problems Neg Hx     Hypotension Neg Hx    Malignant hyperthermia Neg Hx    Pseudochol deficiency Neg Hx     CURRENT MEDICATIONS:  Current Outpatient Medications  Medication Sig Dispense Refill   alendronate  (FOSAMAX ) 70 MG tablet TAKE 1 TABLET(70 MG) BY MOUTH 1 TIME A WEEK WITH A FULL GLASS OF WATER  AND ON AN EMPTY STOMACH 4 tablet 6   anastrozole  (ARIMIDEX ) 1 MG tablet TAKE 1 TABLET(1 MG) BY MOUTH DAILY 30 tablet 6   Calcium  Carbonate-Vitamin D  (CALCIUM -VITAMIN D3 PO) Take by mouth.     cyanocobalamin  (VITAMIN B12) 500 MCG tablet Take 2 tablets (1,000 mcg total) by mouth daily. 180 tablet 3   diclofenac  (VOLTAREN ) 25 MG EC tablet Take 1 tablet (25 mg total) by mouth 2 (two) times daily as needed for moderate pain. 20 tablet 0   DULoxetine  (CYMBALTA ) 30 MG capsule Take 30 mg by mouth daily.     ferrous sulfate  325 (65 FE) MG EC tablet Take 1 tablet (325 mg total) by mouth daily with breakfast. 90 tablet 3   losartan -hydrochlorothiazide  (HYZAAR) 100-25 MG tablet Take 1 tablet by mouth daily.     metoprolol succinate (TOPROL-XL) 25 MG 24 hr tablet Take 25 mg by mouth daily.     omeprazole (PRILOSEC) 20 MG capsule Take 20 mg by mouth 2 (two) times daily.      promethazine -dextromethorphan (PROMETHAZINE -DM) 6.25-15 MG/5ML syrup Take 5 mLs by mouth 4 (four) times daily as needed. 100 mL 0   traZODone  (DESYREL ) 50 MG tablet Take 25 mg by mouth at bedtime as needed for sleep. For sleep     No current facility-administered medications for this visit.    ALLERGIES:  No Known Allergies  LABORATORY DATA:  I have reviewed the labs as listed.     Latest Ref  Rng & Units 03/13/2024   10:49 AM 09/13/2023    9:39 AM 03/06/2023   10:26 AM  CBC  WBC 4.0 - 10.5 K/uL 4.1  6.0  5.0   Hemoglobin 12.0 - 15.0 g/dL 86.9  86.8  87.1   Hematocrit 36.0 - 46.0 % 39.6  40.8  38.4   Platelets 150 - 400 K/uL 170  196  184       Latest Ref Rng & Units 03/13/2024   10:49 AM 09/13/2023    9:39 AM 03/06/2023   10:26 AM  CMP  Glucose  70 - 99 mg/dL 93  890  894   BUN 8 - 23 mg/dL 19  18  25    Creatinine 0.44 - 1.00 mg/dL 9.40  9.23  9.33   Sodium 135 - 145 mmol/L 141  139  137   Potassium 3.5 - 5.1 mmol/L 3.4  3.5  3.4   Chloride 98 - 111 mmol/L 104  102  102   CO2 22 - 32 mmol/L 28  29  28    Calcium  8.9 - 10.3 mg/dL 9.2  9.1  9.0   Total Protein 6.5 - 8.1 g/dL 6.9  7.1  7.0   Total Bilirubin 0.0 - 1.2 mg/dL 0.6  0.5  0.4   Alkaline Phos 38 - 126 U/L 60  70  74   AST 15 - 41 U/L 20  18  19    ALT 0 - 44 U/L 19  18  22      DIAGNOSTIC IMAGING:  I have independently reviewed the scans and discussed with the patient. No results found.   WRAP UP:  All questions were answered. The patient knows to call the clinic with any problems, questions or concerns.  Medical decision making: Moderate  Time spent on visit: I spent 20 minutes counseling the patient face to face. The total time spent in the appointment was 30 minutes and more than 50% was on counseling.  Delon FORBES Hope, NP  03/20/24 2:37 PM

## 2024-03-21 DIAGNOSIS — K449 Diaphragmatic hernia without obstruction or gangrene: Secondary | ICD-10-CM | POA: Diagnosis not present

## 2024-03-21 DIAGNOSIS — R600 Localized edema: Secondary | ICD-10-CM | POA: Diagnosis not present

## 2024-03-21 DIAGNOSIS — I10 Essential (primary) hypertension: Secondary | ICD-10-CM | POA: Diagnosis not present

## 2024-03-21 DIAGNOSIS — K219 Gastro-esophageal reflux disease without esophagitis: Secondary | ICD-10-CM | POA: Diagnosis not present

## 2024-03-21 DIAGNOSIS — E538 Deficiency of other specified B group vitamins: Secondary | ICD-10-CM | POA: Diagnosis not present

## 2024-04-02 ENCOUNTER — Other Ambulatory Visit: Payer: Self-pay | Admitting: Hematology

## 2024-04-02 DIAGNOSIS — M810 Age-related osteoporosis without current pathological fracture: Secondary | ICD-10-CM

## 2024-04-03 ENCOUNTER — Encounter: Payer: Self-pay | Admitting: Hematology

## 2024-04-25 ENCOUNTER — Other Ambulatory Visit: Payer: Self-pay | Admitting: Oncology

## 2024-05-22 DIAGNOSIS — G5750 Tarsal tunnel syndrome, unspecified lower limb: Secondary | ICD-10-CM | POA: Diagnosis not present

## 2024-05-22 DIAGNOSIS — M1990 Unspecified osteoarthritis, unspecified site: Secondary | ICD-10-CM | POA: Diagnosis not present

## 2024-05-22 DIAGNOSIS — M79671 Pain in right foot: Secondary | ICD-10-CM | POA: Diagnosis not present

## 2024-05-22 DIAGNOSIS — M79674 Pain in right toe(s): Secondary | ICD-10-CM | POA: Diagnosis not present

## 2024-05-27 ENCOUNTER — Encounter: Payer: Self-pay | Admitting: Oncology

## 2024-05-31 ENCOUNTER — Ambulatory Visit: Admitting: Podiatry

## 2024-05-31 DIAGNOSIS — M76821 Posterior tibial tendinitis, right leg: Secondary | ICD-10-CM | POA: Diagnosis not present

## 2024-05-31 NOTE — Progress Notes (Signed)
 Subjective:  Patient ID: Heather Vaughan, female    DOB: [birth year],  MRN: [id number]  Chief Complaint  Patient presents with   Foot Pain    Right foot pain  Pt stated that she has been having so much pain and discomfort with her foot  She has done therapy and injections     68 y.o. female presents with the above complaint.  Patient presents with complaint of right medial foot pain that has been off for quite some time has progressed gotten worse worse with ambulation worse with pressure she wanted discuss treatment options for it hurts with resisted dorsiflexion of the foot.  Patient has had history of injection by Dr. Georgina currently in [place].  She does not want to do more injection if she can avoid it.  She would like to discuss next treatment plan   Review of Systems: Negative except as noted in the HPI. Denies N/V/F/Ch.  Past Medical History:  Diagnosis Date   Anxiety    B12 deficiency 01/27/2016   Barrett's esophagus    Breast cancer (HCC)    Breast cancer, left breast (HCC)    Bronchitis 07/27/2011   GERD (gastroesophageal reflux disease)    Hypertension    Osteopenia 10/09/2014    Current Outpatient Medications:    alendronate  (FOSAMAX ) 70 MG tablet, TAKE 1 TABLET(70 MG) BY MOUTH 1 TIME A WEEK WITH A FULL GLASS OF WATER  AND ON AN EMPTY STOMACH, Disp: 4 tablet, Rfl: 6   anastrozole  (ARIMIDEX ) 1 MG tablet, TAKE 1 TABLET(1 MG) BY MOUTH DAILY, Disp: 30 tablet, Rfl: 6   Calcium  Carbonate-Vitamin D  (CALCIUM -VITAMIN D3 PO), Take by mouth., Disp: , Rfl:    cyanocobalamin  (VITAMIN B12) 500 MCG tablet, Take 2 tablets (1,000 mcg total) by mouth daily., Disp: 180 tablet, Rfl: 3   diclofenac  (VOLTAREN ) 25 MG EC tablet, Take 1 tablet (25 mg total) by mouth 2 (two) times daily as needed for moderate pain., Disp: 20 tablet, Rfl: 0   DULoxetine  (CYMBALTA ) 30 MG capsule, Take 30 mg by mouth daily., Disp: , Rfl:    ferrous sulfate  325 (65 FE) MG EC tablet, Take 1 tablet (325 mg total)  by mouth daily with breakfast., Disp: 90 tablet, Rfl: 3   losartan -hydrochlorothiazide  (HYZAAR) 100-25 MG tablet, Take 1 tablet by mouth daily., Disp: , Rfl:    metoprolol succinate (TOPROL-XL) 25 MG 24 hr tablet, Take 25 mg by mouth daily., Disp: , Rfl:    omeprazole (PRILOSEC) 20 MG capsule, Take 20 mg by mouth 2 (two) times daily. , Disp: , Rfl:    promethazine -dextromethorphan (PROMETHAZINE -DM) 6.25-15 MG/5ML syrup, Take 5 mLs by mouth 4 (four) times daily as needed., Disp: 100 mL, Rfl: 0   SYRINGE-NEEDLE, DISP, 3 ML (B-D SYRINGE/NEEDLE 3CC/23GX1) 23G X 1 3 ML MISC, 1 mL by Does not apply route every 30 (thirty) days., Disp: 6 each, Rfl: 0   traZODone  (DESYREL ) 50 MG tablet, Take 25 mg by mouth at bedtime as needed for sleep. For sleep, Disp: , Rfl:   Social History   Tobacco Use  Smoking Status Former   Current packs/day: 0.00   Average packs/day: 1 pack/day for 20.0 years (20.0 ttl pk-yrs)   Types: Cigarettes   Start date: 09/05/1973   Quit date: 09/05/1993   Years since quitting: 30.7  Smokeless Tobacco Never    No Known Allergies Objective:  There were no vitals filed for this visit. There is no height or weight on file to calculate BMI. Constitutional  Well developed. Well nourished.  Vascular Dorsalis pedis pulses palpable bilaterally. Posterior tibial pulses palpable bilaterally. Capillary refill normal to all digits.  No cyanosis or clubbing noted. Pedal hair growth normal.  Neurologic Normal speech. Oriented to person, place, and time. Epicritic sensation to light touch grossly present bilaterally.  Dermatologic Nails well groomed and normal in appearance. No open wounds. No skin lesions.  Orthopedic: Pain along the course of the posterior tibial tendon.  Pain at the insertion pain with resisted plantarflexion inversion of the foot no pain with dorsiflexion eversion of the foot.  Pes planus foot structure noted.   Radiographs: None Assessment:   1. Posterior  tibial tendinitis of right leg    Plan:  Patient was evaluated and treated and all questions answered.  Right posterior tibial tendinitis - All questions and concerns were discussed with the patient in extensive detail given the amount of pain that she is having should benefit from cam boot immobilization Cam boot was dispensed.  No follow-ups on file.   Right posterior tibial tendinitis cam boot immobilization  History of injection therapy by Dr. Blinda in [place]

## 2024-06-17 ENCOUNTER — Encounter: Payer: Self-pay | Admitting: Oncology

## 2024-06-28 ENCOUNTER — Ambulatory Visit: Admitting: Podiatry

## 2024-06-28 DIAGNOSIS — M76821 Posterior tibial tendinitis, right leg: Secondary | ICD-10-CM | POA: Diagnosis not present

## 2024-06-28 NOTE — Addendum Note (Signed)
 Addended by: Breion Novacek on: 06/28/2024 10:59 AM   Modules accepted: Orders

## 2024-06-28 NOTE — Progress Notes (Signed)
 Subjective:  Patient ID: Heather Vaughan, female    DOB: 1955-11-29,  MRN: [id number]  Chief Complaint  Patient presents with   Foot Pain    68 y.o. female presents with the above complaint.  Patient presents for follow-up of right medial foot pain.  She states that this has been going on since 2017.  She has a history of going to physical therapy twice now.  Dr. Blinda put her in a boot and gave her shot for over a year and a half she has been dealing with this pain.  She has not gone back to Dr. Blinda.  They would not take her insurance.  She states that she went to Dr. Fernande who also gave her shot for almost 2 years.  She is still in pain has not gotten any better.  She has been referred to me by Dr. Merlynn.   Review of Systems: Negative except as noted in the HPI. Denies N/V/F/Ch.  Past Medical History:  Diagnosis Date   Anxiety    B12 deficiency 01/27/2016   Barrett's esophagus    Breast cancer (HCC)    Breast cancer, left breast (HCC)    Bronchitis 07/27/2011   GERD (gastroesophageal reflux disease)    Hypertension    Osteopenia 10/09/2014    Current Outpatient Medications:    alendronate  (FOSAMAX ) 70 MG tablet, TAKE 1 TABLET(70 MG) BY MOUTH 1 TIME A WEEK WITH A FULL GLASS OF WATER  AND ON AN EMPTY STOMACH, Disp: 4 tablet, Rfl: 6   anastrozole  (ARIMIDEX ) 1 MG tablet, TAKE 1 TABLET(1 MG) BY MOUTH DAILY, Disp: 30 tablet, Rfl: 6   Calcium  Carbonate-Vitamin D  (CALCIUM -VITAMIN D3 PO), Take by mouth., Disp: , Rfl:    cyanocobalamin  (VITAMIN B12) 500 MCG tablet, Take 2 tablets (1,000 mcg total) by mouth daily., Disp: 180 tablet, Rfl: 3   diclofenac  (VOLTAREN ) 25 MG EC tablet, Take 1 tablet (25 mg total) by mouth 2 (two) times daily as needed for moderate pain., Disp: 20 tablet, Rfl: 0   DULoxetine  (CYMBALTA ) 30 MG capsule, Take 30 mg by mouth daily., Disp: , Rfl:    ferrous sulfate  325 (65 FE) MG EC tablet, Take 1 tablet (325 mg total) by mouth daily with breakfast., Disp: 90  tablet, Rfl: 3   losartan -hydrochlorothiazide  (HYZAAR) 100-25 MG tablet, Take 1 tablet by mouth daily., Disp: , Rfl:    metoprolol succinate (TOPROL-XL) 25 MG 24 hr tablet, Take 25 mg by mouth daily., Disp: , Rfl:    omeprazole (PRILOSEC) 20 MG capsule, Take 20 mg by mouth 2 (two) times daily. , Disp: , Rfl:    promethazine -dextromethorphan (PROMETHAZINE -DM) 6.25-15 MG/5ML syrup, Take 5 mLs by mouth 4 (four) times daily as needed., Disp: 100 mL, Rfl: 0   SYRINGE-NEEDLE, DISP, 3 ML (B-D SYRINGE/NEEDLE 3CC/23GX1) 23G X 1 3 ML MISC, 1 mL by Does not apply route every 30 (thirty) days., Disp: 6 each, Rfl: 0   traZODone  (DESYREL ) 50 MG tablet, Take 25 mg by mouth at bedtime as needed for sleep. For sleep, Disp: , Rfl:   Social History   Tobacco Use  Smoking Status Former   Current packs/day: 0.00   Average packs/day: 1 pack/day for 20.0 years (20.0 ttl pk-yrs)   Types: Cigarettes   Start date: 09/05/1973   Quit date: 09/05/1993   Years since quitting: 30.8  Smokeless Tobacco Never    No Known Allergies Objective:  There were no vitals filed for this visit. There is no height or weight  on file to calculate BMI. Constitutional Well developed. Well nourished.  Vascular Dorsalis pedis pulses palpable bilaterally. Posterior tibial pulses palpable bilaterally. Capillary refill normal to all digits.  No cyanosis or clubbing noted. Pedal hair growth normal.  Neurologic Normal speech. Oriented to person, place, and time. Epicritic sensation to light touch grossly present bilaterally.  Dermatologic Nails well groomed and normal in appearance. No open wounds. No skin lesions.  Orthopedic: Pain along the course of the posterior tibial tendon.  Pain at the insertion pain with resisted plantarflexion inversion of the foot no pain with dorsiflexion eversion of the foot.  Pes planus foot structure noted.   Radiographs: None Assessment:   1. Posterior tibial tendinitis of right leg    Plan:   Patient was evaluated and treated and all questions answered.  Right posterior tibial tendinitis - All questions and concerns were discussed with the patient in extensive detail given the amount of pain that she is having should benefit from cam boot immobilization Cam boot was dispensed.  Continues in cam boot she still continues to hurt - MRI was ordered given that she continues to have pain.

## 2024-07-07 ENCOUNTER — Ambulatory Visit
Admission: RE | Admit: 2024-07-07 | Discharge: 2024-07-07 | Disposition: A | Discharge status: Home / Self Care | Source: Ambulatory Visit | Attending: Podiatry | Admitting: Podiatry

## 2024-07-07 DIAGNOSIS — M76821 Posterior tibial tendinitis, right leg: Secondary | ICD-10-CM

## 2024-08-02 ENCOUNTER — Ambulatory Visit: Admitting: Podiatry

## 2024-08-02 DIAGNOSIS — M76821 Posterior tibial tendinitis, right leg: Secondary | ICD-10-CM | POA: Diagnosis not present

## 2024-08-02 DIAGNOSIS — M898X9 Other specified disorders of bone, unspecified site: Secondary | ICD-10-CM | POA: Diagnosis not present

## 2024-08-02 DIAGNOSIS — Z01818 Encounter for other preprocedural examination: Secondary | ICD-10-CM | POA: Diagnosis not present

## 2024-08-02 NOTE — Progress Notes (Signed)
 Subjective:  Patient ID: Heather Vaughan, female    DOB: [birth year],  MRN: [id number]  Chief Complaint  Patient presents with   Posterior tibial tendinitis of right leg    Pt stated that she is still having a lot of pain and swelling    68 y.o. female presents with the above complaint.  Patient presents for follow-up of right posterior tibial tendinitis.  She has failed all conservative treatment options include physical therapy cam boot immobilization injection she wishes to undergo surgical intervention she is here to go over her MRI as well.  Denies any other acute issues   Review of Systems: Negative except as noted in the HPI. Denies N/V/F/Ch.  Past Medical History:  Diagnosis Date   Anxiety    B12 deficiency 01/27/2016   Barrett's esophagus    Breast cancer (HCC)    Breast cancer, left breast (HCC)    Bronchitis 07/27/2011   GERD (gastroesophageal reflux disease)    Hypertension    Osteopenia 10/09/2014    Current Outpatient Medications:    alendronate  (FOSAMAX ) 70 MG tablet, TAKE 1 TABLET(70 MG) BY MOUTH 1 TIME A WEEK WITH A FULL GLASS OF WATER  AND ON AN EMPTY STOMACH, Disp: 4 tablet, Rfl: 6   anastrozole  (ARIMIDEX ) 1 MG tablet, TAKE 1 TABLET(1 MG) BY MOUTH DAILY, Disp: 30 tablet, Rfl: 6   Calcium  Carbonate-Vitamin D  (CALCIUM -VITAMIN D3 PO), Take by mouth., Disp: , Rfl:    cyanocobalamin  (VITAMIN B12) 500 MCG tablet, Take 2 tablets (1,000 mcg total) by mouth daily., Disp: 180 tablet, Rfl: 3   diclofenac  (VOLTAREN ) 25 MG EC tablet, Take 1 tablet (25 mg total) by mouth 2 (two) times daily as needed for moderate pain., Disp: 20 tablet, Rfl: 0   DULoxetine  (CYMBALTA ) 30 MG capsule, Take 30 mg by mouth daily., Disp: , Rfl:    ferrous sulfate  325 (65 FE) MG EC tablet, Take 1 tablet (325 mg total) by mouth daily with breakfast., Disp: 90 tablet, Rfl: 3   losartan -hydrochlorothiazide  (HYZAAR) 100-25 MG tablet, Take 1 tablet by mouth daily., Disp: , Rfl:    metoprolol succinate  (TOPROL-XL) 25 MG 24 hr tablet, Take 25 mg by mouth daily., Disp: , Rfl:    omeprazole (PRILOSEC) 20 MG capsule, Take 20 mg by mouth 2 (two) times daily. , Disp: , Rfl:    promethazine -dextromethorphan (PROMETHAZINE -DM) 6.25-15 MG/5ML syrup, Take 5 mLs by mouth 4 (four) times daily as needed., Disp: 100 mL, Rfl: 0   SYRINGE-NEEDLE, DISP, 3 ML (B-D SYRINGE/NEEDLE 3CC/23GX1) 23G X 1 3 ML MISC, 1 mL by Does not apply route every 30 (thirty) days., Disp: 6 each, Rfl: 0   traZODone  (DESYREL ) 50 MG tablet, Take 25 mg by mouth at bedtime as needed for sleep. For sleep, Disp: , Rfl:   Social History   Tobacco Use  Smoking Status Former   Current packs/day: 0.00   Average packs/day: 1 pack/day for 20.0 years (20.0 ttl pk-yrs)   Types: Cigarettes   Start date: 09/05/1973   Quit date: 09/05/1993   Years since quitting: 30.9  Smokeless Tobacco Never    No Known Allergies Objective:  There were no vitals filed for this visit. There is no height or weight on file to calculate BMI. Constitutional Well developed. Well nourished.  Vascular Dorsalis pedis pulses palpable bilaterally. Posterior tibial pulses palpable bilaterally. Capillary refill normal to all digits.  No cyanosis or clubbing noted. Pedal hair growth normal.  Neurologic Normal speech. Oriented to person, place, and  time. Epicritic sensation to light touch grossly present bilaterally.  Dermatologic Nails well groomed and normal in appearance. No open wounds. No skin lesions.  Orthopedic: Pain along the course of the posterior tibial tendon.  Pain at the insertion pain with resisted plantarflexion inversion of the foot no pain with dorsiflexion eversion of the foot.  Pes planus foot structure noted.  No pain at the subtalar joint.  No pain with range of motion of the subtalar joint primarily pain is at the posterior tibial tendon including the insertion   Radiographs: IMPRESSION: Moderate nonspecific subcutaneous edema. Recommend  clinical correlation.   Moderate subtalar osteoarthritis.   Pes planus.   Chronic appearing partial tear of the posterior tibial tendon which is thinned a few centimeters from the insertion. This is best seen on axial sequences. Fibers are intact. Assessment:   1. Posterior tibial tendinitis of right leg   2. Bony exostosis   3. Encounter for preoperative examination for general surgical procedure     Plan:  Patient was evaluated and treated and all questions answered.  Right posterior tibial tendinitis - All questions and concerns were discussed with the patient in extensive detail clinically patient has failed all conservative care include shoe gear modification padding protecting offloading cam boot immobilization and injection at this time patient wishes to undergo surgical intervention I discussed that she will benefit from posterior tibial tendon repair with Kidner advancement I discussed this with her in extensive detail she states understanding like to proceed with surgery - I discussed my preoperative IntraOp postop plan with the patient in extensive detail as well. -Informed surgical risk consent was reviewed and read aloud to the patient.  I reviewed the films.  I have discussed my findings with the patient in great detail.  I have discussed all risks including but not limited to infection, stiffness, scarring, limp, disability, deformity, damage to blood vessels and nerves, numbness, poor healing, need for braces, arthritis, chronic pain, amputation, death.  All benefits and realistic expectations discussed in great detail.  I have made no promises as to the outcome.  I have provided realistic expectations.  I have offered the patient a 2nd opinion, which they have declined and assured me they preferred to proceed despite the risks

## 2024-08-08 ENCOUNTER — Telehealth: Payer: Self-pay | Admitting: Podiatry

## 2024-08-08 ENCOUNTER — Telehealth: Payer: Self-pay

## 2024-08-08 NOTE — Telephone Encounter (Signed)
 Patient left message on nurse triage line needing information about scheduling her surgery. Please give patient a call. [phone]

## 2024-08-08 NOTE — Telephone Encounter (Signed)
 Called and patient is scheduled for surgery on 09/19/2024. Patient not on GLP1 or blood thinners. Patient insurance correct in chart.

## 2024-08-12 ENCOUNTER — Telehealth: Payer: Self-pay | Admitting: Podiatry

## 2024-08-12 NOTE — Telephone Encounter (Signed)
 DOS- 08/19/2024  REPAIR POST TIBIAL TENDON/SECONDARY RT- [address] [facility] ADVANCED TENDON RT- [id number]  [facility] EFFECTIVE DATE- 05/29/2024  DEDUCTIBLE- $0 REMAINING- $0 OOP- $4900 REMAINING- $5532.13 COINSURANCE- 0%  PER [employer] PORTAL, PRIOR AUTH IS NOT REQUIRED FOR CPT CODES 72340 AND 28238. DECISION ID# [id number]

## 2024-08-19 ENCOUNTER — Other Ambulatory Visit: Payer: Self-pay | Admitting: Podiatry

## 2024-08-19 DIAGNOSIS — M76821 Posterior tibial tendinitis, right leg: Secondary | ICD-10-CM | POA: Diagnosis not present

## 2024-08-19 MED ORDER — OXYCODONE-ACETAMINOPHEN 5-325 MG PO TABS: 1.0000 | ORAL_TABLET | ORAL | 0 refills | Status: DC | PRN

## 2024-08-19 MED ORDER — IBUPROFEN 800 MG PO TABS: 800.0000 mg | ORAL_TABLET | Freq: Four times a day (QID) | ORAL | 1 refills | Status: AC | PRN

## 2024-08-28 ENCOUNTER — Ambulatory Visit (HOSPITAL_COMMUNITY)

## 2024-08-28 ENCOUNTER — Ambulatory Visit (INDEPENDENT_AMBULATORY_CARE_PROVIDER_SITE_OTHER): Admitting: Podiatry

## 2024-08-28 ENCOUNTER — Ambulatory Visit (INDEPENDENT_AMBULATORY_CARE_PROVIDER_SITE_OTHER)

## 2024-08-28 DIAGNOSIS — M76821 Posterior tibial tendinitis, right leg: Secondary | ICD-10-CM

## 2024-08-28 NOTE — Progress Notes (Signed)
 Patient presents for post-op visit today, POV #1 DOS 08/19/2024 RT REPAIR POST TIBIAL TENDON/SECONDARY, RT [facility] ADVANCED TENDON  Doing good.  Reports a fall while using crutches the day of surgery when she got home. Husband caught her, denies injury. .  Notes: n/a  Vital Signs: Today's Vitals   08/28/24 9062  PainSc: 0-No pain      Radiographs: [x]  Taken []  Not taken  Surgical Site Assessment:  - Dressing:  [x]  Minimal dry blood, intact []  Reinforced   []  Changed     -Notes: n/a  - Incision:  [x]  CDI (clean, dry, intact)  [x]  Mild erythema  []  Drainage noted   -Notes: n/a  - Swelling:  []  None  [x]  Mild  []  Moderate   []  Significant     -Notes: n/a  - Bruising:  []  None  [x]  Present: medial ankle   - Sutures/Staples:  []  None [x]  Intact  []  Removed Today  [x]  Plan to remove at next visit   -Cast/Splint/Pins: [x]  None []  Intact []  Removed Today []  Plan to remove at next visit []  Replaced  -Signs of infection:  [x]  None  []  Present - Describe:   -DME:    []  None [x]  AFW []  Surgical shoe []  Cast  []  Splint  -Walking status:  []  Full WB  []  Partial WB  [x]  NWB  -Utilizing device:  []  None [x]  Knee Scooter []  Crutches []  Wheelchair    DVT assessment:  [x]  Denies symptoms []  Chest pain/SOB []  Pain in calf/redness/warmth   Redressed DSD and ace wrap. Educated on signs of infection, proper dressing care, pain management, and weight bearing status. Patient will contact provider with any new or worsening symptoms. The provider assessed the patient today and reviewed instructions regarding plan of care.

## 2024-08-30 ENCOUNTER — Encounter: Admitting: Podiatry

## 2024-09-02 ENCOUNTER — Ambulatory Visit (HOSPITAL_COMMUNITY)
Admission: RE | Admit: 2024-09-02 | Discharge: 2024-09-02 | Disposition: A | Discharge status: Home / Self Care | Source: Ambulatory Visit | Attending: Oncology | Admitting: Oncology

## 2024-09-02 DIAGNOSIS — C773 Secondary and unspecified malignant neoplasm of axilla and upper limb lymph nodes: Secondary | ICD-10-CM | POA: Diagnosis present

## 2024-09-02 DIAGNOSIS — C50912 Malignant neoplasm of unspecified site of left female breast: Secondary | ICD-10-CM | POA: Diagnosis present

## 2024-09-02 DIAGNOSIS — Z1231 Encounter for screening mammogram for malignant neoplasm of breast: Secondary | ICD-10-CM | POA: Insufficient documentation

## 2024-09-11 ENCOUNTER — Ambulatory Visit: Admitting: Podiatry

## 2024-09-11 DIAGNOSIS — M76821 Posterior tibial tendinitis, right leg: Secondary | ICD-10-CM

## 2024-09-11 NOTE — Progress Notes (Signed)
 Patient presents for post-op visit today, POV #2 DOS 08/19/2024 RT REPAIR POST TIBIAL TENDON/SECONDARY, RT [facility] ADVANCED TENDON   Doing good. Denies issues or falls.    Notes: n/a  Vital Signs: Today's Vitals   09/11/24 1340  PainSc: 0-No pain      Radiographs: []  Taken [x]  Not taken  Surgical Site Assessment:  - Dressing:  [x]  Minimal dry blood, intact []  Reinforced   []  Changed     -Notes: n/a  - Incision:  [x]  CDI (clean, dry, intact)  [x]  Mild erythema  []  Drainage noted   -Notes: n/a  - Swelling:  []  None  [x]  Mild  []  Moderate   []  Significant     -Notes: n/a  - Bruising:  [x]  None  []  Present: n/a   - Sutures/Staples:  []  None [x]  Intact  []  Some Removed Today  []  Plan to remove at next visit     -Notes: some wound dehiscence upon removal of distal staple, steri-strip placed and provider aware.   -Cast/Splint/Pins: [x]  None []  Intact []  Removed Today []  Plan to remove at next visit []  Replaced  -Signs of infection:  [x]  None  []  Present - Describe: n/a  -DME:    []  None [x]  AFW []  Surgical shoe []  Cast  []  Splint  -Walking status:  []  Full WB  []  Partial WB  [x]  NWB  -Utilizing device:  []  None [x]  Knee Scooter []  Crutches []  Wheelchair    DVT assessment:  [x]  Denies symptoms []  Chest pain/SOB []  Pain in calf/redness/warmth   Redressed DSD and ace wrap. Educated on signs of infection, proper dressing care, pain management, and weight bearing status. Patient will contact provider with any new or worsening symptoms. The provider assessed the patient today and reviewed instructions regarding plan of care.

## 2024-09-12 ENCOUNTER — Encounter

## 2024-09-18 ENCOUNTER — Inpatient Hospital Stay: Attending: Physician Assistant

## 2024-09-18 DIAGNOSIS — C50919 Malignant neoplasm of unspecified site of unspecified female breast: Secondary | ICD-10-CM

## 2024-09-18 DIAGNOSIS — M810 Age-related osteoporosis without current pathological fracture: Secondary | ICD-10-CM

## 2024-09-18 DIAGNOSIS — C50912 Malignant neoplasm of unspecified site of left female breast: Secondary | ICD-10-CM

## 2024-09-18 DIAGNOSIS — D7589 Other specified diseases of blood and blood-forming organs: Secondary | ICD-10-CM

## 2024-09-18 DIAGNOSIS — D509 Iron deficiency anemia, unspecified: Secondary | ICD-10-CM

## 2024-09-18 DIAGNOSIS — E538 Deficiency of other specified B group vitamins: Secondary | ICD-10-CM

## 2024-09-18 LAB — COMPREHENSIVE METABOLIC PANEL WITH GFR
ALT: 16 U/L (ref 0–44)
AST: 18 U/L (ref 15–41)
Albumin: 4.5 g/dL (ref 3.5–5.0)
Alkaline Phosphatase: 62 U/L (ref 38–126)
Anion gap: 15 (ref 5–15)
BUN: 16 mg/dL (ref 8–23)
CO2: 24 mmol/L (ref 22–32)
Calcium: 9.4 mg/dL (ref 8.9–10.3)
Chloride: 103 mmol/L (ref 98–111)
Creatinine, Ser: 0.75 mg/dL (ref 0.44–1.00)
GFR, Estimated: >60 mL/min
Glucose, Bld: 94 mg/dL (ref 70–99)
Potassium: 3.7 mmol/L (ref 3.5–5.1)
Sodium: 142 mmol/L (ref 135–145)
Total Bilirubin: 0.4 mg/dL (ref 0.0–1.2)
Total Protein: 6.9 g/dL (ref 6.5–8.1)

## 2024-09-18 LAB — CBC WITH DIFFERENTIAL/PLATELET
Abs Immature Granulocytes: 0.01 K/uL (ref 0.00–0.07)
Basophils Absolute: 0 K/uL (ref 0.0–0.1)
Basophils Relative: 1 %
Eosinophils Absolute: 0.2 K/uL (ref 0.0–0.5)
Eosinophils Relative: 4 %
HCT: 41.6 % (ref 36.0–46.0)
Hemoglobin: 13.5 g/dL (ref 12.0–15.0)
Immature Granulocytes: 0 %
Lymphocytes Relative: 30 %
Lymphs Abs: 1.7 K/uL (ref 0.7–4.0)
MCH: 33.2 pg (ref 26.0–34.0)
MCHC: 32.5 g/dL (ref 30.0–36.0)
MCV: 102.2 fL — ABNORMAL HIGH (ref 80.0–100.0)
Monocytes Absolute: 0.4 K/uL (ref 0.1–1.0)
Monocytes Relative: 8 %
Neutro Abs: 3.1 K/uL (ref 1.7–7.7)
Neutrophils Relative %: 57 %
Platelets: 177 K/uL (ref 150–400)
RBC: 4.07 MIL/uL (ref 3.87–5.11)
RDW: 12 % (ref 11.5–15.5)
WBC: 5.5 K/uL (ref 4.0–10.5)
nRBC: 0 % (ref 0.0–0.2)

## 2024-09-18 LAB — IRON AND TIBC
Iron: 122 ug/dL (ref 28–170)
Saturation Ratios: 34 % — ABNORMAL HIGH (ref 10.4–31.8)
TIBC: 364 ug/dL (ref 250–450)
UIBC: 242 ug/dL

## 2024-09-18 LAB — VITAMIN B12: Vitamin B-12: 439 pg/mL (ref 180–914)

## 2024-09-18 LAB — FERRITIN: Ferritin: 59 ng/mL (ref 11–307)

## 2024-09-18 LAB — VITAMIN D 25 HYDROXY (VIT D DEFICIENCY, FRACTURES): Vit D, 25-Hydroxy: 44.2 ng/mL (ref 30–100)

## 2024-09-21 LAB — METHYLMALONIC ACID, SERUM: Methylmalonic Acid, Quantitative: 412 nmol/L — ABNORMAL HIGH (ref 0–378)

## 2024-09-23 NOTE — Progress Notes (Unsigned)
 "  Heather Vaughan [address], [place] [address]   CLINIC:  Medical Oncology/Hematology  PCP:  Heather Vaughan [address] [place] [address] [phone]   BRIEF ONCOLOGIC HISTORY:   Oncology History  Invasive ductal carcinoma metastasized to axillary lymph node, left  05/28/2013 Imaging   Screening mammogram   06/19/2013 Imaging   Left diagnostic mammogram   06/25/2013 Imaging   CT CAP- borderline enlarged axillary lymph node on left.  Small pulmonary nodules noted but too small for PET or biopsy; surveillance recommended.    07/03/2013 Surgery   Left partial mastectomy.  Invasive ductal carcinoma. 1.4 cm.  POSITIVE MARGINS.  Additional extensive DCIS noted.  ER 74%, PR 16%, Ki-67 52%, HER2 NEGATIVE.   07/17/2013 Surgery   Left modified radiacl mastectomy with left axillary node dissection.  Negative residual cancer.  Negative inked margins.  1/12 positive lymph nodes.  FINAL MARGINS NEGATIVE.  LYMPH NODE IS HER2 POSITIVE.   08/13/2013 Imaging   MUGA scan shows left ventricular EF of 62%   08/27/2013 - 12/18/2013 Chemotherapy   TCH x 6 cycles   01/07/2014 -  Chemotherapy   Herceptin  x 52 weeks   01/27/2014 - 03/13/2014 Radiation Therapy   By Dr. Keenan   03/29/2014 -  Anti-estrogen oral therapy   Aromasin    10/24/2014 Imaging   Bone density- BMD as determined from Femur Neck Right is 0.698 g/cm2 with a T-Score of -2.4. This patient is considered osteopenic according to World Health Organization [facility]) criteria. (Lumbar spine was not utilized due to advanced degenerative changes).   11/05/2014 Imaging   MUGA- Left ventricular ejection fraction equals 70%. No change from prior.   04/27/2015 Procedure   Port-A-Cath removal by Dr. Mavis   06/19/2015 Imaging   CT chest- Status post left mastectomy, without recurrent or metastatic disease. Decrease in size of right-sided pulmonary nodules, favoring an infectious or inflammatory  etiology.   06/21/2017 Imaging   CT Chest w contrast: IMPRESSION: 1. No evidence of metastatic disease in the thorax. Multiple previously noted tiny pulmonary nodules are stable compared to prior studies and considered benign. 2. Aortic atherosclerosis. 3. Mild diffuse bronchial wall thickening with mild centrilobular and paraseptal emphysema most apparent the lung apices; imaging findings that may suggest underlying COPD.     CANCER STAGING:  Cancer Staging  Invasive ductal carcinoma metastasized to axillary lymph node, left Staging form: Breast, AJCC 7th Edition - Clinical: Stage IIA (T1c, N1, cM0) - Signed by Berry Debby RAMAN, PA-C on 09/15/2013   INTERVAL HISTORY:   Heather Vaughan, a 69 y.o. female, returns for routine follow-up of her  history of stage IIa left-sided breast cancer. [name] was last seen on 03/20/2024 by and Delon Hope.   In the interim since last visit, she had surgery to repair a torn tendon on her right foot (08/19/2024).   She is recovering well.  She denies any other hospitalizations, surgeries, or changes in baseline health status. At today's visit, she reports feeling well.   She reports 100% energy and 100% appetite.   She is maintaining stable weight at this time.  HISTORY OF BREAST CANCER: She denies any new breast lumps or axillary lymphadenopathy. No new onset aches/pains, headaches, abdominal pain. She completed Arimidex  in August 2025.   OSTEOPOROSIS: She is taking Fosamax  for her osteoporosis in the setting of aromatase inhibitor use.   She denies any recent bone pain, fractures, jaw pain, or  dental issues. She reports regular dental follow-up.  IRON & B12 DEFICIENCY: She denies any rectal bleeding, melena, or other signs of blood loss. She denies any fatigue or ice pica. She stopped taking her iron and B12 tablets several months ago.   ASSESSMENT & PLAN:  1.  Left breast cancer (stage IIa) s/p left modified radical  mastectomy - Left modified radical mastectomy on 07/17/2013, 1/12 lymph nodes positive, grade 2, 1.4 cm invasive tumor, margins negative, ER 74% positive, PR 16% positive, HER-2 negative, Ki-67 52%, [id number].  HER-2/neu was positive on testing by [facility] on lymph node. - 6 cycles of TCH from 08/27/2013 through 12/18/2013, 1 year Herceptin  completed on 08/28/2014. - Status post XRT completed in July 2015. - Anastrozole  x 10 years completed (August 2015 through August 2025)  - Physical examination (09/25/2024) shows left mastectomy within normal limits.  Right breast has no palpable masses.  No palpable adenopathy. - No red flag symptoms of recurrence per patient - Reviewed labs from 09/18/2024 which showed normal LFTs and baseline CBC.   - Most recent right breast mammogram (09/02/2024): BI-RADS Category 1, negative  - PLAN: Patient has completed treatment of breast cancer, and can discharge to PCP for long-term surveillance to include annual mammogram and breast exam (next due January 2027)  2.  Vitamin B12 deficiency - Patient has chronic mild macrocytosis - Nutritional panel checked on 09/08/2022 showed marginal vitamin B12 at 275, with elevated MMA 591.  Normal folate. - She received vitamin B12 injection on 09/15/2022. - She is taking vitamin B12 1000 mcg daily  - Most recent labs (09/18/2024): Vitamin B12 439, MMA mildly elevated but improved at 412 - PLAN: Recommend restarting vitamin B12 to 1000 mcg EVERY OTHER DAY with goal to keep vitamin B12 >400 and MMA levels normal.  Defer ongoing management to PCP.    3.  Iron deficiency without anemia - Nutritional panel from 09/08/2022 revealed low ferritin at 17, iron saturation 34% - No bright red blood per rectum or melena - No pica or fatigue. - She has been taking ferrous sulfate  325 mg daily since January 2024 - Most recent labs (09/18/2024): Hgb 13.5, ferritin 59, iron saturation 34% - PLAN: Recommend restarting ferrous sulfate  325 mg EVERY OTHER  DAY with goal to keep ferritin >100.   Defer ongoing management to PCP.     4.  Osteoporosis - Most recent DEXA scan (03/06/2023): T-score -2.1, improved - DEXA scan (02/17/2021): T score -2.6 - DEXA scan from 01/09/2019 shows T score of -2.6.  This was slightly worse from the prior scans of -2.5, and -2.4 prior to that. - She received temporarily Prolia  on 10/09/2014, 04/09/2015 and 09/28/2015.  This was discontinued secondary to high insurance cost. - She is currently taking Fosamax  70 mg weekly since June 2020.  She takes Vitamin D  daily. - No recent fractures, bone pain, jaw pain, or dental issues.   She reports regular dental follow-up. - Most recent labs (09/18/2024): Calcium  9.4, vitamin D  44.2 - PLAN: Since patient has completed antiestrogen therapy and been on Fosamax  for 5 years, she can DISCONTINUE Fosamax  at this time. - Continue calcium  and vitamin D  supplements. - Continue weightbearing exercises. - Defer ongoing management of osteoporosis to PCP.    PLAN SUMMARY:  >> Discharge to PCP w/ following recommendations: Annual mammogram and breast exam via PCP (due January 2027) Monitor B12 and iron levels at least once a year Fosamax  discontinued.  Bone density/DEXA via PCP every 2 to 3 years.  REVIEW OF SYSTEMS:  Review of Systems  Constitutional:  Negative for appetite change, chills, diaphoresis, fatigue, fever and unexpected weight change.  HENT:   Negative for lump/mass, nosebleeds and sore throat.   Eyes:  Negative for eye problems.  Respiratory:  Negative for cough, hemoptysis and shortness of breath.   Cardiovascular:  Negative for chest pain, leg swelling and palpitations.  Gastrointestinal:  Negative for abdominal pain, blood in stool, constipation, diarrhea, nausea and vomiting.  Genitourinary:  Negative for hematuria.   Skin: Negative.   Neurological:  Negative for dizziness, headaches and light-headedness.  Hematological:  Does not bruise/bleed easily.    PHYSICAL  EXAM:  Performance status (ECOG): 1 - Symptomatic but completely ambulatory  Vitals:   09/25/24 1405  BP: 133/67  Pulse: 66  Resp: 18  Temp: (!) 97.2 F (36.2 C)  SpO2: 98%    Wt Readings from Last 3 Encounters:  09/25/24 124 lb (56.2 kg)  03/20/24 133 lb 9.6 oz (60.6 kg)  09/20/23 147 lb 12.8 oz (67 kg)   Physical Exam Constitutional:      Appearance: Normal appearance. She is obese.  HENT:     Head: Normocephalic and atraumatic.     Mouth/Throat:     Mouth: Mucous membranes are moist.  Eyes:     Extraocular Movements: Extraocular movements intact.     Pupils: Pupils are equal, round, and reactive to light.  Cardiovascular:     Rate and Rhythm: Normal rate and regular rhythm.     Pulses: Normal pulses.     Heart sounds: Normal heart sounds.  Pulmonary:     Effort: Pulmonary effort is normal.     Breath sounds: Normal breath sounds.  Chest:  Breasts:    Right: Normal.     Left: Absent.       Comments: Left mastectomy site within normal limits. No discrete nodules or masses palpated within the right breast. Abdominal:     General: Bowel sounds are normal.     Palpations: Abdomen is soft.     Tenderness: There is no abdominal tenderness.  Musculoskeletal:        General: No swelling.     Right lower leg: No edema.     Left lower leg: No edema.  Lymphadenopathy:     Cervical: No cervical adenopathy.     Upper Body:     Right upper body: No supraclavicular, axillary or pectoral adenopathy.     Left upper body: No supraclavicular, axillary or pectoral adenopathy.  Skin:    General: Skin is warm and dry.  Neurological:     General: No focal deficit present.     Mental Status: She is alert and oriented to person, place, and time.  Psychiatric:        Mood and Affect: Mood normal.        Behavior: Behavior normal.      PAST MEDICAL/SURGICAL HISTORY:  Past Medical History:  Diagnosis Date   Anxiety    B12 deficiency 01/27/2016   Barrett's esophagus     Breast cancer (HCC)    Breast cancer, left breast (HCC)    Bronchitis 07/27/2011   GERD (gastroesophageal reflux disease)    Hypertension    Osteopenia 10/09/2014   Past Surgical History:  Procedure Laterality Date   BREAST BIOPSY Bilateral    X 4- benign   ESOPHAGOGASTRODUODENOSCOPY  08/03/2011   Procedure: ESOPHAGOGASTRODUODENOSCOPY (EGD);  Surgeon: Claudis RAYMOND Rivet, MD;  Location: [facility];  Service: Endoscopy;  Laterality: N/A;  12:45   EXCISION MORTON'S NEUROMA  07/14/2011   Procedure: EXCISION MORTON'S NEUROMA;  Surgeon: Morene Donley Anon;  Location: [facility];  Service: Orthopedics;  Laterality: Right;  Removal Neuroma 3rd Interspace Right Foot   MASTECTOMY Left    MASTECTOMY MODIFIED RADICAL Left 07/17/2013   Procedure: MASTECTOMY MODIFIED RADICAL WITH LEFT AXILLARY TISSUE REMOVAL;  Surgeon: Elsie GORMAN Holland, MD;  Location: [facility];  Service: General;  Laterality: Left;   PARTIAL MASTECTOMY WITH NEEDLE LOCALIZATION Left 07/03/2013   Procedure: PARTIAL MASTECTOMY STATUS POST NEEDLE LOCALIZATION;  Surgeon: Elsie GORMAN Holland, MD;  Location: [facility];  Service: General;  Laterality: Left;   PORT-A-CATH REMOVAL Right 04/27/2015   Procedure: REMOVAL PORT-A-CATH;  Surgeon: Oneil Budge, MD;  Location: [facility];  Service: General;  Laterality: Right;   PORTACATH PLACEMENT Right 08/16/2013   Procedure: INSERTION PORT-A-CATH RIGHT SUBCLAVIAN;  Surgeon: Elsie GORMAN Holland, MD;  Location: [facility];  Service: General;  Laterality: Right;    SOCIAL HISTORY:  Social History   Socioeconomic History   Marital status: Married    Spouse name: Not on file   Number of children: Not on file   Years of education: Not on file   Highest education level: Not on file  Occupational History   Not on file  Tobacco Use   Smoking status: Former    Current packs/day: 0.00    Average packs/day: 1 pack/day for 20.0 years (20.0 ttl pk-yrs)    Types: Cigarettes    Start date: 09/05/1973    Quit date:  09/05/1993    Years since quitting: 31.0   Smokeless tobacco: Never  Substance and Sexual Activity   Alcohol use: No    Comment: occasionally   Drug use: No   Sexual activity: Not on file  Other Topics Concern   Not on file  Social History Narrative   Not on file   Social Drivers of Health   Tobacco Use: Medium Risk (03/20/2024)   Patient History    Smoking Tobacco Use: Former    Smokeless Tobacco Use: Never    Passive Exposure: Not on Actuary Strain: Not on file  Food Insecurity: Not on file  Transportation Needs: Not on file  Physical Activity: Not on file  Stress: Not on file  Social Connections: Not on file  Intimate Partner Violence: Not on file  Depression (PHQ2-9): Low Risk (09/25/2024)   Depression (PHQ2-9)    PHQ-2 Score: 0  Alcohol Screen: Not on file  Housing: Not on file  Utilities: Not on file  Health Literacy: Not on file    FAMILY HISTORY:  Family History  Problem Relation Age of Onset   Anesthesia problems Neg Hx    Hypotension Neg Hx    Malignant hyperthermia Neg Hx    Pseudochol deficiency Neg Hx     CURRENT MEDICATIONS:  Current Outpatient Medications  Medication Sig Dispense Refill   DULoxetine  (CYMBALTA ) 30 MG capsule Take 30 mg by mouth daily.     ibuprofen  (ADVIL ) 800 MG tablet Take 1 tablet (800 mg total) by mouth every 6 (six) hours as needed. 60 tablet 1   losartan -hydrochlorothiazide  (HYZAAR) 100-25 MG tablet Take 1 tablet by mouth daily.     omeprazole (PRILOSEC) 20 MG capsule Take 20 mg by mouth 2 (two) times daily.      traZODone  (DESYREL ) 50 MG tablet Take 25 mg by mouth at bedtime as needed for sleep. For sleep     Calcium  Carbonate-Vitamin  D (CALCIUM -VITAMIN D3 PO) Take by mouth.     cyanocobalamin  (VITAMIN B12) 500 MCG tablet Take 2 tablets (1,000 mcg total) by mouth daily. 180 tablet 3   diclofenac  (VOLTAREN ) 25 MG EC tablet Take 1 tablet (25 mg total) by mouth 2 (two) times daily as needed for moderate pain. 20  tablet 0   ferrous sulfate  325 (65 FE) MG EC tablet Take 1 tablet (325 mg total) by mouth daily with breakfast. 90 tablet 3   metoprolol succinate (TOPROL-XL) 25 MG 24 hr tablet Take 25 mg by mouth daily.     promethazine -dextromethorphan (PROMETHAZINE -DM) 6.25-15 MG/5ML syrup Take 5 mLs by mouth 4 (four) times daily as needed. 100 mL 0   SYRINGE-NEEDLE, DISP, 3 ML (B-D SYRINGE/NEEDLE 3CC/23GX1) 23G X 1 3 ML MISC 1 mL by Does not apply route every 30 (thirty) days. 6 each 0   No current facility-administered medications for this visit.    ALLERGIES:  No Known Allergies  LABORATORY DATA:  I have reviewed the labs as listed.     Latest Ref Rng & Units 09/18/2024    1:51 PM 03/13/2024   10:49 AM 09/13/2023    9:39 AM  CBC  WBC 4.0 - 10.5 K/uL 5.5  4.1  6.0   Hemoglobin 12.0 - 15.0 g/dL 86.4  86.9  86.8   Hematocrit 36.0 - 46.0 % 41.6  39.6  40.8   Platelets 150 - 400 K/uL 177  170  196       Latest Ref Rng & Units 09/18/2024    1:51 PM 03/13/2024   10:49 AM 09/13/2023    9:39 AM  CMP  Glucose 70 - 99 mg/dL 94  93  890   BUN 8 - 23 mg/dL 16  19  18    Creatinine 0.44 - 1.00 mg/dL 9.24  9.40  9.23   Sodium 135 - 145 mmol/L 142  141  139   Potassium 3.5 - 5.1 mmol/L 3.7  3.4  3.5   Chloride 98 - 111 mmol/L 103  104  102   CO2 22 - 32 mmol/L 24  28  29    Calcium  8.9 - 10.3 mg/dL 9.4  9.2  9.1   Total Protein 6.5 - 8.1 g/dL 6.9  6.9  7.1   Total Bilirubin 0.0 - 1.2 mg/dL 0.4  0.6  0.5   Alkaline Phos 38 - 126 U/L 62  60  70   AST 15 - 41 U/L 18  20  18    ALT 0 - 44 U/L 16  19  18      DIAGNOSTIC IMAGING:  I have independently reviewed the scans and discussed with the patient. MM 3D SCREENING MAMMOGRAM UNILATERAL RIGHT BREAST Result Date: 09/04/2024 CLINICAL DATA:  Screening. EXAM: DIGITAL SCREENING UNILATERAL RIGHT MAMMOGRAM WITH CAD AND TOMOSYNTHESIS TECHNIQUE: Right screening digital craniocaudal and mediolateral oblique mammograms were obtained. Right screening digital breast  tomosynthesis was performed. The images were evaluated with computer-aided detection. COMPARISON:  Previous exam(s). ACR Breast Density Category b: There are scattered areas of fibroglandular density. FINDINGS: The patient has had a left mastectomy. There are no findings suspicious for malignancy. IMPRESSION: No mammographic evidence of malignancy. A result letter of this screening mammogram will be mailed directly to the patient. RECOMMENDATION: Screening mammogram in one year.  (Code:[id number]-[id number]) BI-RADS CATEGORY  1: Negative. Electronically Signed   By: Norleen Croak M.D.   On: 09/04/2024 09:00   DG Foot 2 Views Right Result Date: 08/28/2024 Please see detailed  radiograph report in office note.    WRAP UP:  All questions were answered. The patient knows to call the clinic with any problems, questions or concerns.  Medical decision making: Moderate  Time spent on visit: I spent 20 minutes counseling the patient face to face. The total time spent in the appointment was 30 minutes and more than 50% was on counseling.  Pleasant CHRISTELLA Barefoot, PA-C  09/25/24 2:45 PM  "

## 2024-09-25 ENCOUNTER — Inpatient Hospital Stay: Admitting: Physician Assistant

## 2024-09-25 VITALS — BP 133/67 | HR 66 | Temp 97.2°F | Resp 18 | Ht 61.5 in | Wt 124.0 lb

## 2024-09-25 DIAGNOSIS — C773 Secondary and unspecified malignant neoplasm of axilla and upper limb lymph nodes: Secondary | ICD-10-CM

## 2024-09-25 DIAGNOSIS — D509 Iron deficiency anemia, unspecified: Secondary | ICD-10-CM | POA: Diagnosis not present

## 2024-09-25 DIAGNOSIS — M810 Age-related osteoporosis without current pathological fracture: Secondary | ICD-10-CM

## 2024-09-25 DIAGNOSIS — E538 Deficiency of other specified B group vitamins: Secondary | ICD-10-CM | POA: Diagnosis not present

## 2024-09-25 DIAGNOSIS — C50912 Malignant neoplasm of unspecified site of left female breast: Secondary | ICD-10-CM | POA: Diagnosis not present

## 2024-09-25 DIAGNOSIS — C50919 Malignant neoplasm of unspecified site of unspecified female breast: Secondary | ICD-10-CM

## 2024-09-25 NOTE — Patient Instructions (Signed)
 [facility] at [facility] **VISIT SUMMARY & IMPORTANT INSTRUCTIONS **   You were seen today by Pleasant Barefoot PA-C for your follow-up visit.    HISTORY OF BREAST CANCER:  At this time, you do NOT have any signs of recurrent breast cancer.  Your most recent mammogram was normal and your labs do not show any major abnormalities.  Physical exam today was normal.  You have completed treatment with your anastrozole /Arimidex  x 10 years.  At this time, you are stable for discharge to primary care!  Your primary care provider should continue to check mammogram and breast exam once a year, which is next due in January 2027.  OSTEOPOROSIS: Have completed treatment with your Fosamax .  You can discontinue your Fosamax  at this time.  Continue your calcium  and vitamin D  supplements.  Your primary care provider can order bone density scans as needed in the future.  IRON DEFICIENCY: Please restart your iron tablet once every other day.  VITAMIN B-12 DEFICIENCY:  Restart your vitamin B12 supplement 1000 mcg every other day   ** Thank you for trusting me with your healthcare!  I strive to provide all of my patients with quality care at each visit.  If you receive a survey for this visit, I would be so grateful to you for taking the time to provide feedback.  Thank you in advance!  ~ Lexton Hidalgo                                        Dr. Mickiel Davonna Pleasant Barefoot, PA-C          Delon Hope, NP   - - - - - - - - - - - - - - - - - -     Thank you for choosing [facility] at [facility] to provide your oncology and hematology care.  To afford each patient quality time with our provider, please arrive at least 15 minutes before your scheduled appointment time.   If you have a lab appointment with the Cancer Center please come in thru the Main Entrance and check in at the main information desk.  You need to re-schedule your appointment should you  arrive 10 or more minutes late.  We strive to give you quality time with our providers, and arriving late affects you and other patients whose appointments are after yours.  Also, if you no show three or more times for appointments you may be dismissed from the clinic at the providers discretion.     Again, thank you for choosing [facility].  Our hope is that these requests will decrease the amount of time that you wait before being seen by our physicians.       _____________________________________________________________  Should you have questions after your visit to [facility], please contact our office at [phone] and follow the prompts.  Our office hours are 8:00 a.m. and 4:30 p.m. Monday - Friday.  Please note that voicemails left after 4:00 p.m. may not be returned until the following business day.  We are closed weekends and major holidays.  You do have access to a nurse 24-7, just call the main number to the clinic [phone] and do not press any options, hold on the line and a nurse will answer the phone.  For prescription refill requests, have your pharmacy contact our office and allow 72 hours.

## 2024-10-02 ENCOUNTER — Ambulatory Visit (INDEPENDENT_AMBULATORY_CARE_PROVIDER_SITE_OTHER): Admitting: Podiatry

## 2024-10-02 ENCOUNTER — Encounter: Payer: Self-pay | Admitting: Podiatry

## 2024-10-02 DIAGNOSIS — Z9889 Other specified postprocedural states: Secondary | ICD-10-CM

## 2024-10-02 DIAGNOSIS — M76821 Posterior tibial tendinitis, right leg: Secondary | ICD-10-CM

## 2024-10-02 DIAGNOSIS — M898X9 Other specified disorders of bone, unspecified site: Secondary | ICD-10-CM

## 2024-10-02 NOTE — Progress Notes (Signed)
 "  Subjective:  Patient ID: Heather Vaughan, female    DOB: 04-17-1956,  MRN: [id number]  Chief Complaint  Patient presents with   Routine Post Op    POV #3 DOS 08/19/2024 RT REPAIR POST TIBIAL TENDON/SECONDARY, RT [facility] ADVANCED TENDON    DOS: 08/19/2024 Procedure: Right posterior tibial tendon repair with Kidner advancement  69 y.o. female returns for post-op check.  She states that she is doing good pain is controlled.  She has been weightbearing as tolerated with Cam boot.  She has good strength and range of motion bandages clean dry and intact  Review of Systems: Negative except as noted in the HPI. Denies N/V/F/Ch.  Past Medical History:  Diagnosis Date   Anxiety    B12 deficiency 01/27/2016   Barrett's esophagus    Breast cancer (HCC)    Breast cancer, left breast (HCC)    Bronchitis 07/27/2011   GERD (gastroesophageal reflux disease)    Hypertension    Osteopenia 10/09/2014   Current Medications[1]  Tobacco Use History[2]  Allergies[3] Objective:  There were no vitals filed for this visit. There is no height or weight on file to calculate BMI. Constitutional Well developed. Well nourished.  Vascular Foot warm and well perfused. Capillary refill normal to all digits.   Neurologic Normal speech. Oriented to person, place, and time. Epicritic sensation to light touch grossly present bilaterally.  Dermatologic Skin healing well without signs of infection. Skin edges well coapted without signs of infection.  Orthopedic: Tenderness to palpation noted about the surgical site.   Radiographs: None Assessment:   1. Posterior tibial tendinitis of right leg   2. Bony exostosis   3. Status post foot surgery    Plan:  Patient was evaluated and treated and all questions answered.  S/p foot surgery right -Progressing as expected post-operatively. -XR: See above -WB Status: Weightbearing as tolerated in regular shoes -Sutures: None. -Medications: None - At this  time I encouraged her to start wearing more and putting more weight on the cam boot.  She states understanding I would like for her to transition to regular shoes next week she states understanding.  I will see her back again in 4 weeks and we will discuss orthotics option as well.  No follow-ups on file.     [1]  Current Outpatient Medications:    Calcium  Carbonate-Vitamin D  (CALCIUM -VITAMIN D3 PO), Take by mouth., Disp: , Rfl:    cyanocobalamin  (VITAMIN B12) 500 MCG tablet, Take 2 tablets (1,000 mcg total) by mouth daily., Disp: 180 tablet, Rfl: 3   diclofenac  (VOLTAREN ) 25 MG EC tablet, Take 1 tablet (25 mg total) by mouth 2 (two) times daily as needed for moderate pain., Disp: 20 tablet, Rfl: 0   DULoxetine  (CYMBALTA ) 30 MG capsule, Take 30 mg by mouth daily., Disp: , Rfl:    ferrous sulfate  325 (65 FE) MG EC tablet, Take 1 tablet (325 mg total) by mouth daily with breakfast., Disp: 90 tablet, Rfl: 3   ibuprofen  (ADVIL ) 800 MG tablet, Take 1 tablet (800 mg total) by mouth every 6 (six) hours as needed., Disp: 60 tablet, Rfl: 1   losartan -hydrochlorothiazide  (HYZAAR) 100-25 MG tablet, Take 1 tablet by mouth daily., Disp: , Rfl:    metoprolol succinate (TOPROL-XL) 25 MG 24 hr tablet, Take 25 mg by mouth daily., Disp: , Rfl:    omeprazole (PRILOSEC) 20 MG capsule, Take 20 mg by mouth 2 (two) times daily. , Disp: , Rfl:    promethazine -dextromethorphan (PROMETHAZINE -DM) 6.25-15  MG/5ML syrup, Take 5 mLs by mouth 4 (four) times daily as needed., Disp: 100 mL, Rfl: 0   SYRINGE-NEEDLE, DISP, 3 ML (B-D SYRINGE/NEEDLE 3CC/23GX1) 23G X 1 3 ML MISC, 1 mL by Does not apply route every 30 (thirty) days., Disp: 6 each, Rfl: 0   traZODone  (DESYREL ) 50 MG tablet, Take 25 mg by mouth at bedtime as needed for sleep. For sleep, Disp: , Rfl:  [2]  Social History Tobacco Use  Smoking Status Former   Current packs/day: 0.00   Average packs/day: 1 pack/day for 20.0 years (20.0 ttl pk-yrs)   Types: Cigarettes    Start date: 09/05/1973   Quit date: 09/05/1993   Years since quitting: 31.0  Smokeless Tobacco Never  [3] No Known Allergies  "

## 2024-10-04 ENCOUNTER — Other Ambulatory Visit: Payer: Self-pay | Admitting: Podiatry

## 2024-10-04 DIAGNOSIS — M76821 Posterior tibial tendinitis, right leg: Secondary | ICD-10-CM

## 2024-10-04 DIAGNOSIS — Z9889 Other specified postprocedural states: Secondary | ICD-10-CM

## 2024-10-25 ENCOUNTER — Encounter: Admitting: Podiatry
